# Patient Record
Sex: Female | Born: 1945
Health system: Southern US, Community
[De-identification: ages and names within clinical notes are randomized; demographics above are authoritative.]

## PROBLEM LIST (undated history)

## (undated) DIAGNOSIS — E039 Hypothyroidism, unspecified: Secondary | ICD-10-CM

## (undated) DIAGNOSIS — M47816 Spondylosis without myelopathy or radiculopathy, lumbar region: Secondary | ICD-10-CM

## (undated) DIAGNOSIS — F329 Major depressive disorder, single episode, unspecified: Secondary | ICD-10-CM

## (undated) DIAGNOSIS — F32A Depression, unspecified: Secondary | ICD-10-CM

## (undated) DIAGNOSIS — I10 Essential (primary) hypertension: Secondary | ICD-10-CM

## (undated) DIAGNOSIS — M1990 Unspecified osteoarthritis, unspecified site: Secondary | ICD-10-CM

## (undated) DIAGNOSIS — J45909 Unspecified asthma, uncomplicated: Secondary | ICD-10-CM

## (undated) DIAGNOSIS — I499 Cardiac arrhythmia, unspecified: Secondary | ICD-10-CM

## (undated) DIAGNOSIS — T7840XA Allergy, unspecified, initial encounter: Secondary | ICD-10-CM

## (undated) DIAGNOSIS — Z972 Presence of dental prosthetic device (complete) (partial): Secondary | ICD-10-CM

## (undated) DIAGNOSIS — M5431 Sciatica, right side: Secondary | ICD-10-CM

## (undated) DIAGNOSIS — G8929 Other chronic pain: Secondary | ICD-10-CM

## (undated) DIAGNOSIS — I4891 Unspecified atrial fibrillation: Secondary | ICD-10-CM

## (undated) DIAGNOSIS — M797 Fibromyalgia: Secondary | ICD-10-CM

## (undated) DIAGNOSIS — M545 Low back pain, unspecified: Secondary | ICD-10-CM

## (undated) DIAGNOSIS — R7303 Prediabetes: Secondary | ICD-10-CM

## (undated) DIAGNOSIS — D493 Neoplasm of unspecified behavior of breast: Secondary | ICD-10-CM

## (undated) HISTORY — DX: Major depressive disorder, single episode, unspecified: F32.9

## (undated) HISTORY — DX: Unspecified atrial fibrillation: I48.91

## (undated) HISTORY — DX: Allergy, unspecified, initial encounter: T78.40XA

## (undated) HISTORY — DX: Spondylosis without myelopathy or radiculopathy, lumbar region: M47.816

## (undated) HISTORY — DX: Depression, unspecified: F32.A

## (undated) HISTORY — PX: ABDOMINAL HYSTERECTOMY: SHX81

## (undated) HISTORY — DX: Prediabetes: R73.03

## (undated) HISTORY — DX: Neoplasm of unspecified behavior of breast: D49.3

## (undated) HISTORY — PX: BREAST SURGERY: SHX581

## (undated) HISTORY — DX: Unspecified asthma, uncomplicated: J45.909

## (undated) HISTORY — PX: ELECTROPHYSIOLOGIC STUDY: SHX172A

---

## 2004-06-13 ENCOUNTER — Other Ambulatory Visit: Payer: Self-pay

## 2004-06-13 ENCOUNTER — Inpatient Hospital Stay: Payer: Self-pay

## 2004-06-14 ENCOUNTER — Other Ambulatory Visit: Payer: Self-pay

## 2004-06-15 ENCOUNTER — Other Ambulatory Visit: Payer: Self-pay

## 2005-06-27 ENCOUNTER — Ambulatory Visit: Payer: Self-pay

## 2006-03-13 ENCOUNTER — Ambulatory Visit: Payer: Self-pay | Admitting: Pain Medicine

## 2006-03-20 ENCOUNTER — Ambulatory Visit: Payer: Self-pay | Admitting: Pain Medicine

## 2006-03-27 ENCOUNTER — Ambulatory Visit: Payer: Self-pay | Admitting: Pain Medicine

## 2006-05-15 ENCOUNTER — Emergency Department: Payer: Self-pay | Admitting: Emergency Medicine

## 2006-05-16 ENCOUNTER — Other Ambulatory Visit: Payer: Self-pay

## 2006-05-16 ENCOUNTER — Ambulatory Visit: Payer: Self-pay | Admitting: Emergency Medicine

## 2006-06-27 ENCOUNTER — Ambulatory Visit: Payer: Self-pay | Admitting: Family Medicine

## 2006-12-13 ENCOUNTER — Ambulatory Visit: Payer: Self-pay | Admitting: Pain Medicine

## 2006-12-19 ENCOUNTER — Ambulatory Visit: Payer: Self-pay | Admitting: Pain Medicine

## 2007-01-09 ENCOUNTER — Ambulatory Visit: Payer: Self-pay | Admitting: Physician Assistant

## 2007-01-23 ENCOUNTER — Ambulatory Visit: Payer: Self-pay | Admitting: Pain Medicine

## 2007-02-09 ENCOUNTER — Ambulatory Visit: Payer: Self-pay | Admitting: Physician Assistant

## 2007-02-23 ENCOUNTER — Ambulatory Visit: Payer: Self-pay | Admitting: Physician Assistant

## 2007-03-22 ENCOUNTER — Ambulatory Visit: Payer: Self-pay | Admitting: Physician Assistant

## 2007-03-24 ENCOUNTER — Emergency Department: Payer: Self-pay | Admitting: Emergency Medicine

## 2007-04-02 ENCOUNTER — Ambulatory Visit: Payer: Self-pay | Admitting: Specialist

## 2007-04-06 ENCOUNTER — Emergency Department: Payer: Self-pay | Admitting: Emergency Medicine

## 2007-04-18 ENCOUNTER — Ambulatory Visit: Payer: Self-pay | Admitting: Gastroenterology

## 2007-06-18 ENCOUNTER — Ambulatory Visit: Payer: Self-pay

## 2007-06-20 ENCOUNTER — Ambulatory Visit: Payer: Self-pay | Admitting: Physician Assistant

## 2007-07-19 ENCOUNTER — Ambulatory Visit: Payer: Self-pay | Admitting: Physician Assistant

## 2007-08-06 ENCOUNTER — Ambulatory Visit: Payer: Self-pay | Admitting: Family Medicine

## 2007-08-14 ENCOUNTER — Ambulatory Visit: Payer: Self-pay | Admitting: Physician Assistant

## 2007-08-20 ENCOUNTER — Ambulatory Visit: Payer: Self-pay | Admitting: Pain Medicine

## 2007-08-22 ENCOUNTER — Ambulatory Visit: Payer: Self-pay | Admitting: Family Medicine

## 2007-08-23 ENCOUNTER — Ambulatory Visit: Payer: Self-pay | Admitting: Family Medicine

## 2007-08-31 ENCOUNTER — Other Ambulatory Visit: Payer: Self-pay

## 2007-08-31 ENCOUNTER — Emergency Department: Payer: Self-pay | Admitting: Emergency Medicine

## 2007-09-13 ENCOUNTER — Ambulatory Visit: Payer: Self-pay | Admitting: Family Medicine

## 2007-09-17 ENCOUNTER — Ambulatory Visit: Payer: Self-pay | Admitting: Physician Assistant

## 2007-10-17 ENCOUNTER — Ambulatory Visit: Payer: Self-pay | Admitting: Physician Assistant

## 2007-10-18 ENCOUNTER — Ambulatory Visit: Payer: Self-pay | Admitting: Family Medicine

## 2007-11-13 ENCOUNTER — Ambulatory Visit: Payer: Self-pay | Admitting: Physician Assistant

## 2007-12-10 ENCOUNTER — Ambulatory Visit: Payer: Self-pay | Admitting: Specialist

## 2007-12-12 ENCOUNTER — Ambulatory Visit: Payer: Self-pay | Admitting: Physician Assistant

## 2007-12-21 ENCOUNTER — Ambulatory Visit: Payer: Self-pay | Admitting: Specialist

## 2008-01-17 ENCOUNTER — Ambulatory Visit: Payer: Self-pay | Admitting: Surgery

## 2008-01-21 ENCOUNTER — Ambulatory Visit: Payer: Self-pay | Admitting: Surgery

## 2008-02-20 ENCOUNTER — Ambulatory Visit: Payer: Self-pay | Admitting: Physician Assistant

## 2008-03-19 ENCOUNTER — Ambulatory Visit: Payer: Self-pay | Admitting: Pain Medicine

## 2008-04-05 ENCOUNTER — Ambulatory Visit: Payer: Self-pay | Admitting: Oncology

## 2008-04-11 ENCOUNTER — Emergency Department: Payer: Self-pay | Admitting: Emergency Medicine

## 2008-04-11 ENCOUNTER — Other Ambulatory Visit: Payer: Self-pay

## 2008-04-12 ENCOUNTER — Inpatient Hospital Stay: Payer: Self-pay | Admitting: Internal Medicine

## 2008-04-12 ENCOUNTER — Other Ambulatory Visit: Payer: Self-pay

## 2008-04-28 ENCOUNTER — Ambulatory Visit: Payer: Self-pay | Admitting: Maternal and Fetal Medicine

## 2008-05-06 ENCOUNTER — Ambulatory Visit: Payer: Self-pay | Admitting: Maternal and Fetal Medicine

## 2008-05-06 ENCOUNTER — Ambulatory Visit: Payer: Self-pay | Admitting: Oncology

## 2008-05-14 ENCOUNTER — Ambulatory Visit: Payer: Self-pay | Admitting: Specialist

## 2008-06-05 ENCOUNTER — Ambulatory Visit: Payer: Self-pay | Admitting: Oncology

## 2008-06-19 ENCOUNTER — Ambulatory Visit: Payer: Self-pay | Admitting: Oncology

## 2008-06-23 ENCOUNTER — Ambulatory Visit: Payer: Self-pay | Admitting: Physician Assistant

## 2008-07-06 ENCOUNTER — Ambulatory Visit: Payer: Self-pay | Admitting: Oncology

## 2008-08-05 ENCOUNTER — Ambulatory Visit: Payer: Self-pay | Admitting: Oncology

## 2008-09-24 ENCOUNTER — Ambulatory Visit: Payer: Self-pay | Admitting: Physician Assistant

## 2008-10-06 ENCOUNTER — Ambulatory Visit: Payer: Self-pay | Admitting: Specialist

## 2008-10-21 ENCOUNTER — Ambulatory Visit: Payer: Self-pay | Admitting: Specialist

## 2009-01-03 ENCOUNTER — Ambulatory Visit: Payer: Self-pay | Admitting: Oncology

## 2009-01-12 ENCOUNTER — Ambulatory Visit: Payer: Self-pay | Admitting: Oncology

## 2009-02-03 ENCOUNTER — Ambulatory Visit: Payer: Self-pay | Admitting: Oncology

## 2009-02-23 ENCOUNTER — Ambulatory Visit: Payer: Self-pay | Admitting: Family Medicine

## 2009-07-29 ENCOUNTER — Emergency Department: Payer: Self-pay | Admitting: Emergency Medicine

## 2009-12-04 ENCOUNTER — Ambulatory Visit: Payer: Self-pay | Admitting: Oncology

## 2009-12-08 ENCOUNTER — Ambulatory Visit: Payer: Self-pay | Admitting: Oncology

## 2010-01-03 ENCOUNTER — Ambulatory Visit: Payer: Self-pay | Admitting: Oncology

## 2010-01-15 ENCOUNTER — Emergency Department: Payer: Self-pay | Admitting: Internal Medicine

## 2010-01-15 ENCOUNTER — Emergency Department: Payer: Self-pay | Admitting: Emergency Medicine

## 2010-02-13 ENCOUNTER — Emergency Department: Payer: Self-pay | Admitting: Emergency Medicine

## 2010-02-24 ENCOUNTER — Observation Stay: Payer: Self-pay | Admitting: Internal Medicine

## 2010-06-23 ENCOUNTER — Ambulatory Visit: Payer: Self-pay | Admitting: Family Medicine

## 2011-02-17 ENCOUNTER — Ambulatory Visit: Payer: Self-pay | Admitting: Family Medicine

## 2012-02-02 ENCOUNTER — Ambulatory Visit: Payer: Self-pay | Admitting: Family Medicine

## 2012-06-03 ENCOUNTER — Observation Stay: Payer: Self-pay | Admitting: Internal Medicine

## 2012-06-03 LAB — CBC
MCH: 27.9 pg (ref 26.0–34.0)
MCHC: 32.8 g/dL (ref 32.0–36.0)
MCV: 85 fL (ref 80–100)
Platelet: 209 10*3/uL (ref 150–440)
RBC: 4.59 10*6/uL (ref 3.80–5.20)

## 2012-06-03 LAB — BASIC METABOLIC PANEL
Anion Gap: 9 (ref 7–16)
BUN: 13 mg/dL (ref 7–18)
Chloride: 109 mmol/L — ABNORMAL HIGH (ref 98–107)
EGFR (Non-African Amer.): >60
Glucose: 117 mg/dL — ABNORMAL HIGH (ref 65–99)
Osmolality: 288 (ref 275–301)
Potassium: 3.9 mmol/L (ref 3.5–5.1)
Sodium: 144 mmol/L (ref 136–145)

## 2013-05-16 ENCOUNTER — Ambulatory Visit: Payer: Self-pay | Admitting: Family Medicine

## 2013-05-30 ENCOUNTER — Ambulatory Visit: Payer: Self-pay | Admitting: Family Medicine

## 2013-07-03 ENCOUNTER — Ambulatory Visit: Payer: Self-pay | Admitting: Family Medicine

## 2013-07-05 ENCOUNTER — Ambulatory Visit: Payer: Self-pay | Admitting: Oncology

## 2013-08-16 ENCOUNTER — Ambulatory Visit: Payer: Self-pay | Admitting: Oncology

## 2013-08-16 LAB — CBC CANCER CENTER
Basophil #: 0.1 x10 3/mm (ref 0.0–0.1)
Eosinophil %: 1.8 %
HCT: 38.8 % (ref 35.0–47.0)
HGB: 12.3 g/dL (ref 12.0–16.0)
Lymphocyte #: 2.8 x10 3/mm (ref 1.0–3.6)
Lymphocyte %: 40.1 %
MCH: 26 pg (ref 26.0–34.0)
MCV: 82 fL (ref 80–100)
Monocyte %: 5.1 %
Neutrophil #: 3.6 x10 3/mm (ref 1.4–6.5)
Neutrophil %: 51.9 %

## 2013-08-16 LAB — IRON AND TIBC
Iron Bind.Cap.(Total): 389 ug/dL (ref 250–450)
Iron Saturation: 10 %
Unbound Iron-Bind.Cap.: 352 ug/dL

## 2013-08-16 LAB — LACTATE DEHYDROGENASE: LDH: 176 U/L (ref 81–246)

## 2013-08-16 LAB — FERRITIN: Ferritin (ARMC): 14 ng/mL (ref 8–388)

## 2013-09-05 ENCOUNTER — Ambulatory Visit: Payer: Self-pay | Admitting: Oncology

## 2013-11-28 ENCOUNTER — Ambulatory Visit: Payer: Self-pay | Admitting: Family Medicine

## 2013-12-08 ENCOUNTER — Emergency Department: Payer: Self-pay | Admitting: Emergency Medicine

## 2013-12-18 ENCOUNTER — Emergency Department: Payer: Self-pay | Admitting: Internal Medicine

## 2014-01-03 ENCOUNTER — Ambulatory Visit: Payer: Self-pay | Admitting: Family Medicine

## 2014-01-07 ENCOUNTER — Encounter: Payer: Self-pay | Admitting: Family Medicine

## 2014-12-04 ENCOUNTER — Ambulatory Visit: Admit: 2014-12-04 | Disposition: A | Discharge status: Home / Self Care | Payer: Self-pay | Admitting: Family Medicine

## 2014-12-23 NOTE — H&P (Signed)
PATIENT NAME:  Sonya Harper, Sonya Harper MR#:  [id number] DATE OF BIRTH:  Jul 20, 1946  DATE OF ADMISSION:  06/03/2012  ADDENDUM:   HOME MEDICATIONS:  1. Coreg CR 40 mg oral daily.  2. Synthroid 0.05 mg oral daily.  3. Zantac 75 mg oral b.i.d.    ____________________________ Albertine Patricia, MD dse:cbb D: 06/03/2012 04:57:50 ET T: 06/03/2012 11:15:45 ET JOB#: [id number]  cc: Albertine Patricia, MD, <Dictator> Ashok Norris, MD DAWOOD Graciela Husbands MD ELECTRONICALLY SIGNED 06/06/2012 1:46

## 2014-12-23 NOTE — Discharge Summary (Signed)
PATIENT NAME:  Sonya Harper, CONTI MR#:  [id number] DATE OF BIRTH:  1946/03/08  DATE OF ADMISSION:  06/03/2012 DATE OF DISCHARGE:  06/03/2012  PRIMARY CARE PHYSICIAN: Dr. Rutherford Nail   DISCHARGE DIAGNOSES:  1. Angioedema.  2. Hypertension.  3. Hypothyroidism.   CONDITION: Fair.   FOLLOWUP: Follow up with Dr. Rutherford Nail as needed.   DISCHARGE MEDICATIONS:  1. Prednisone 40 mg oral taper over five days.  2. Zantac 75 mg b.i.d. for four days.  3. Benadryl 25 mg t.i.d. for four days.  4. Coreg 6.25 mg oral 2 times a day.   ADMITTING HISTORY AND PHYSICAL: Please see detailed History and Physical dictated previously.   HOSPITAL COURSE:  69 year old African American female patient admitted to the Critical Care Unit secondary to swelling of her face and tongue, shortness of breath, and dysphagia. She was started on IV steroids, Zantac, and Benadryl. Improved well with symptoms improving significantly with no stridor, no dysphagia, and the swelling significantly reduced, vital signs in the normal range, and the patient was discharged home. At the time of discharge the patient's vital signs showed blood pressure 148/70, respirations 13, pulse 68, saturating 98% on room air.   DISCHARGE INSTRUCTIONS:  1. Follow up with Dr. Rutherford Nail in a week as needed.  2. Continue medications as prescribed.  3. The patient will be on cardiac diet with activity as tolerated.  4. The patient is to call her doctor or return to the Emergency Room if  she has any worsening symptoms.   TIME SPENT: Time spent today on this discharge dictation along with coordinating care and counseling of the patient was 40 minutes.   ____________________________ Leia Alf Lunden Stieber, MD srs:bjt D: 06/03/2012 09:46:42 ET T: 06/04/2012 09:57:58 ET JOB#: [id number]  cc: Alveta Heimlich R. Darvin Neighbours, MD, <Dictator> Ashok Norris, MD Alveta Heimlich Arlice Colt MD ELECTRONICALLY SIGNED 06/07/2012 11:19

## 2014-12-23 NOTE — H&P (Signed)
PATIENT NAME:  Sonya Harper, Sonya Harper MR#:  [id number] DATE OF BIRTH:  09-18-1945  DATE OF ADMISSION:  06/03/2012  REFERRING PHYSICIAN: Lurline Hare, MD   PRIMARY CARE PHYSICIAN: Ashok Norris, MD   CHIEF COMPLAINT: Shortness of breath, tongue and lip swelling.   HISTORY OF PRESENT ILLNESS: This is a 69 year old female with significant past medical history of recurrent angioedema in the past of reactive type with allergy to multiple medications and environmental allergies where she has been seen and followed by allergy physician in [place]. The patient was last admitted in June 2011 for similar presentation. The patient presents today with an episode of sudden shortness of breath, tongue and lip swelling that woke her up from sleep. In the ED the patient was given IV Zantac, IV Benadryl, IV Solu-Medrol, and EpiPen injection without much improvement of her symptoms so admission was requested by hospitalist for further monitoring of her airway. The patient reports shortness of breath and increased secretions. The patient denies any chest pain, abdominal pain, nausea, vomiting, diarrhea, constipation. The patient's family reports they used new kind of dust pesticide today where they were spraying it around the house and in the living room. The patient is unsure if she was exposed to it or not.   PAST MEDICAL HISTORY:  1. History of angioedema in the past.  2. Chronic fatigue syndrome.  3. Fibromyalgia.  4. History of adenopathy status post biopsy.  5. Hypertension.  6. Hypothyroidism.  7. History of colitis.   PAST SURGICAL HISTORY: Hysterectomy.   ALLERGIES: Lyrica, penicillin, sulfa, wheat, dust mites, mold.   HOME MEDICATIONS:  1. Coreg CR 40 mg oral daily.  2. Synthroid 0.05 mg oral daily.  3. Zantac 75 mg oral b.i.d.     FAMILY HISTORY: No history of heart disease, diabetes. No history of any acute allergies.   SOCIAL HISTORY: The patient lives at home with her family. No history of  smoking or alcohol intake. She is on disability.   REVIEW OF SYSTEMS: No fever. No complaints of weakness. Has complaint of some generalized fatigue. EYES: Denies blurry vision, double vision, or eye pain. ENT: No tinnitus, ear pain or epistaxis. RESPIRATORY: No cough. Had some dyspnea tonight and increased secretion, tongue and lip swelling. CARDIOVASCULAR: No chest pain or orthopnea. GI: No nausea, no vomiting, no diarrhea, no constipation. GU: No dysuria, no frequency, no hesitancy. ENDOCRINE: No polyuria, no dysuria, no nocturia. HEMATOLOGY: No anemia. No active bleeding. No bruising. INTEGUMENTARY: No acne or rash. MUSCULOSKELETAL: No arthritis, swelling, or gout. NEUROLOGIC: No numbness, weakness. No focal deficits. PSYCH: No history of depression. No alcohol or substance abuse.   PHYSICAL EXAMINATION:   VITAL SIGNS: Pulse 64, respiratory rate 20, blood pressure 126/72, saturating 98% on 1.5 liters nasal cannula.   GENERAL: Well nourished African American female who is comfortable in bed in no apparent distress still having some garbled speech.   HEENT: Head atraumatic, normocephalic. Pupils equal, reactive to light. Pink conjunctivae. Anicteric sclerae. Clear tympanic membranes. The patient had significant swelling of the tongue and the lip. No adenopathy.   NECK: Supple. No thyromegaly. No JVD. No swelling.   LUNGS: Clear to auscultation.   CHEST: Good air entry. No wheezing, rales, rhonchi. Upper airway have no stridor.   HEART: S1, S2 heard. No rubs, murmur, or gallops.   ABDOMEN: Obese, soft, nontender, nondistended. Bowel sounds present.   MUSCULOSKELETAL: Motor strength 5/5. No cyanosis. No clubbing. No joint swelling.   SKIN: No rash or erythema.  NEUROLOGIC: Cranial nerves grossly intact. No edema. Motor 5 out of 5.   PSYCHIATRIC: Appropriate affect. Awake, alert x3. Intact judgment and insight.   PERTINENT LABS: Glucose 117, BUN 13, creatinine 0.88, sodium 144,  potassium 3.9, chloride 109, CO2 26, white blood cells 10.6, hemoglobin 12.8, hematocrit 39.1, platelets 209.   ASSESSMENT AND PLAN:  1. Angioedema. This seems to be recurrent angioedema related to acute allergy. This appears to be allergy to new pesticide family started using at home. Will continue patient on steroids, Benadryl, and Zantac. The patient will be admitted to CCU for monitoring her airway. Will be continued on oxygen p.r.n. The patient will need EpiPen on discharge.  2. Hypertension. The patient will continue Coreg when she is able to take p.o. intake. The patient has been on Coreg for 17 years.  3. Hypothyroidism. Continue with Synthroid.  4. DVT prophylaxis. Sub-Q Lovenox.   TOTAL TIME SPENT ON ADMISSION AND PATIENT CARE: 55 minutes.   CODE STATUS: FULL CODE.   ____________________________ Albertine Patricia, MD dse:drc D: 06/03/2012 04:53:47 ET T: 06/03/2012 10:37:15 ET JOB#: [id number]  cc: Albertine Patricia, MD, <Dictator> Ashok Norris, MD Kelsee Preslar Graciela Husbands MD ELECTRONICALLY SIGNED 06/06/2012 1:47

## 2015-01-05 ENCOUNTER — Other Ambulatory Visit: Payer: Self-pay

## 2015-01-09 DIAGNOSIS — Z1239 Encounter for other screening for malignant neoplasm of breast: Secondary | ICD-10-CM | POA: Diagnosis not present

## 2015-01-09 DIAGNOSIS — N63 Unspecified lump in breast: Secondary | ICD-10-CM | POA: Diagnosis not present

## 2015-01-12 ENCOUNTER — Other Ambulatory Visit: Payer: Self-pay | Admitting: Family Medicine

## 2015-01-12 DIAGNOSIS — Z1239 Encounter for other screening for malignant neoplasm of breast: Secondary | ICD-10-CM

## 2015-01-12 DIAGNOSIS — N63 Unspecified lump in unspecified breast: Secondary | ICD-10-CM

## 2015-01-13 ENCOUNTER — Ambulatory Visit
Admission: RE | Admit: 2015-01-13 | Discharge: 2015-01-13 | Disposition: A | Discharge status: Home / Self Care | Payer: Medicare Other | Source: Ambulatory Visit | Attending: Family Medicine | Admitting: Family Medicine

## 2015-01-13 ENCOUNTER — Ambulatory Visit: Payer: Self-pay

## 2015-01-13 DIAGNOSIS — N63 Unspecified lump in unspecified breast: Secondary | ICD-10-CM

## 2015-01-13 DIAGNOSIS — N6489 Other specified disorders of breast: Secondary | ICD-10-CM | POA: Diagnosis not present

## 2015-01-13 DIAGNOSIS — R928 Other abnormal and inconclusive findings on diagnostic imaging of breast: Secondary | ICD-10-CM | POA: Insufficient documentation

## 2015-01-13 DIAGNOSIS — Z1239 Encounter for other screening for malignant neoplasm of breast: Secondary | ICD-10-CM

## 2015-02-06 DIAGNOSIS — E559 Vitamin D deficiency, unspecified: Secondary | ICD-10-CM | POA: Diagnosis not present

## 2015-02-06 DIAGNOSIS — H0520 Unspecified exophthalmos: Secondary | ICD-10-CM | POA: Diagnosis not present

## 2015-02-06 DIAGNOSIS — K229 Disease of esophagus, unspecified: Secondary | ICD-10-CM | POA: Diagnosis not present

## 2015-02-06 DIAGNOSIS — E039 Hypothyroidism, unspecified: Secondary | ICD-10-CM | POA: Diagnosis not present

## 2015-02-06 DIAGNOSIS — R5383 Other fatigue: Secondary | ICD-10-CM | POA: Diagnosis not present

## 2015-02-06 DIAGNOSIS — E042 Nontoxic multinodular goiter: Secondary | ICD-10-CM | POA: Diagnosis not present

## 2015-02-06 DIAGNOSIS — I10 Essential (primary) hypertension: Secondary | ICD-10-CM | POA: Diagnosis not present

## 2015-02-06 DIAGNOSIS — E063 Autoimmune thyroiditis: Secondary | ICD-10-CM | POA: Diagnosis not present

## 2015-03-03 ENCOUNTER — Encounter (INDEPENDENT_AMBULATORY_CARE_PROVIDER_SITE_OTHER): Payer: Self-pay

## 2015-03-03 ENCOUNTER — Ambulatory Visit (INDEPENDENT_AMBULATORY_CARE_PROVIDER_SITE_OTHER): Payer: Medicare Other | Admitting: Family Medicine

## 2015-03-03 ENCOUNTER — Encounter: Payer: Self-pay | Admitting: Family Medicine

## 2015-03-03 VITALS — BP 138/88 | HR 79 | Temp 98.0°F | Resp 16 | Ht 66.0 in | Wt 197.1 lb

## 2015-03-03 DIAGNOSIS — W57XXXA Bitten or stung by nonvenomous insect and other nonvenomous arthropods, initial encounter: Secondary | ICD-10-CM

## 2015-03-03 DIAGNOSIS — E049 Nontoxic goiter, unspecified: Secondary | ICD-10-CM | POA: Diagnosis not present

## 2015-03-03 DIAGNOSIS — J4531 Mild persistent asthma with (acute) exacerbation: Secondary | ICD-10-CM

## 2015-03-03 DIAGNOSIS — K219 Gastro-esophageal reflux disease without esophagitis: Secondary | ICD-10-CM | POA: Diagnosis not present

## 2015-03-03 DIAGNOSIS — T148 Other injury of unspecified body region: Secondary | ICD-10-CM

## 2015-03-03 DIAGNOSIS — J4530 Mild persistent asthma, uncomplicated: Secondary | ICD-10-CM | POA: Insufficient documentation

## 2015-03-03 MED ORDER — OMEPRAZOLE 40 MG PO CPDR: 40.0000 mg | DELAYED_RELEASE_CAPSULE | Freq: Every day | ORAL | Status: DC

## 2015-03-03 NOTE — Progress Notes (Signed)
Name: Sonya Harper   MRN: [id number]    DOB: [birth year]   Date:03/03/2015       Progress Note  Subjective  Chief Complaint  Chief Complaint  Patient presents with  . Depression  . Asthma  . Hypothyroidism    Asthma She complains of cough, difficulty breathing, shortness of breath and wheezing. There is no hemoptysis. This is a chronic problem. The current episode started more than 1 year ago. The problem occurs intermittently. Associated symptoms include heartburn, malaise/fatigue and myalgias. Pertinent negatives include no chest pain, fever, headaches, sore throat or weight loss. Her symptoms are aggravated by pollen, exposure to smoke and exposure to fumes. Her symptoms are alleviated by beta-agonist and steroid inhaler. She reports moderate improvement on treatment. Her past medical history is significant for asthma.  Thyroid Problem Symptoms include anxiety (has a goiter for which she is seeingDr. Francoise Schaumann), depressed mood, fatigue, leg swelling and palpitations. Patient reports no constipation, diarrhea, tremors or weight loss. The symptoms have been worsening. Past treatments include levothyroxine. The treatment provided moderate relief. There is no history of atrial fibrillation.  Hypertension This is a chronic problem. The current episode started more than 1 year ago. The problem is unchanged. The problem is controlled. Associated symptoms include anxiety, malaise/fatigue, palpitations and shortness of breath. Pertinent negatives include no blurred vision, chest pain, headaches, neck pain or orthopnea. Agents associated with hypertension include thyroid hormones. Risk factors for coronary artery disease include dyslipidemia, sedentary lifestyle, stress and post-menopausal state. Past treatments include beta blockers. The current treatment provides moderate improvement. There are no compliance problems.  Hypertensive end-organ damage includes a thyroid problem.   Fatigue  Patient  complains of recurrent fatigue. This is particularly been a problem since she's had serum adjustments and questions about her thyroid medication she is currently thinking of switching to a different endocrinologist.   Past Medical History  Diagnosis Date  . Allergy   . Breast neoplasm   . Depression   . Asthma     History  Substance Use Topics  . Smoking status: Never Smoker   . Smokeless tobacco: Not on file  . Alcohol Use: No     Current outpatient prescriptions:  .  carvedilol (COREG CR) 80 MG 24 hr capsule, Take 80 mg by mouth daily., Disp: , Rfl:  .  omeprazole (PRILOSEC) 40 MG capsule, Take 40 mg by mouth daily., Disp: , Rfl:  .  SYNTHROID 50 MCG tablet, TAKE 1 BY MOUTH DAILY. DO NOT SUBSTITUTE, Disp: , Rfl: 0  Allergies  Allergen Reactions  . Penicillins Other (See Comments)    Other Reaction: Not Assessed    Review of Systems  Constitutional: Positive for malaise/fatigue and fatigue. Negative for fever, chills and weight loss.  HENT: Negative for congestion, hearing loss, sore throat and tinnitus.   Eyes: Negative for blurred vision, double vision and redness.  Respiratory: Positive for cough, shortness of breath and wheezing. Negative for hemoptysis.   Cardiovascular: Positive for palpitations. Negative for chest pain, orthopnea, claudication and leg swelling.  Gastrointestinal: Positive for heartburn. Negative for nausea, vomiting, diarrhea, constipation and blood in stool.  Genitourinary: Negative for dysuria, urgency, frequency and hematuria.  Musculoskeletal: Positive for myalgias and joint pain. Negative for back pain, falls and neck pain.  Skin: Positive for rash. Negative for itching.  Neurological: Positive for weakness. Negative for dizziness, tingling, tremors, focal weakness, seizures, loss of consciousness and headaches.  Endo/Heme/Allergies: Does not bruise/bleed easily.  Psychiatric/Behavioral: Positive for depression. Negative  for substance abuse. The  patient is nervous/anxious (has a goiter for which she is seeingDr. Francoise Schaumann). The patient does not have insomnia.      Objective  Filed Vitals:   03/03/15 1008  BP: 138/88  Pulse: 79  Temp: 98 F (36.7 C)  Resp: 16  Height: 5\' 6"  (1.676 m)  Weight: 197 lb 2 oz (89.415 kg)  SpO2: 95%     Physical Exam  Constitutional: She is oriented to person, place, and time and well-developed, well-nourished, and in no distress.  HENT:  Head: Normocephalic.  Eyes: EOM are normal. Pupils are equal, round, and reactive to light.  Neck: Normal range of motion. No thyromegaly present.  Cardiovascular: Normal rate, regular rhythm and normal heart sounds.   No murmur heard. Pulmonary/Chest: Effort normal and breath sounds normal.  Abdominal: Soft. Bowel sounds are normal.  Musculoskeletal: Normal range of motion. She exhibits no edema.  Neurological: She is alert and oriented to person, place, and time. No cranial nerve deficit. Gait normal.  Skin: Skin is warm and dry. Rash (multiple pigmented palpable papules which are erythematous on the lower extremities consistent with insect bites) noted.  Psychiatric: Memory and affect normal.      Assessment & Plan  1. Goiter Per endocrinologist  2. Gastroesophageal reflux disease without esophagitis Stable  3. Asthma, mild persistent, with acute exacerbation Recurrent cough or exacerbations - Ambulatory referral to Pulmonology  4. Insect bite OTC hydrocortisone cream. If this is not effective did prescription for Aristocort cream

## 2015-03-03 NOTE — Patient Instructions (Signed)
F/U 4 MO 

## 2015-03-19 ENCOUNTER — Institutional Professional Consult (permissible substitution): Payer: Medicare Other | Admitting: Internal Medicine

## 2015-03-25 ENCOUNTER — Telehealth: Payer: Self-pay | Admitting: Family Medicine

## 2015-03-25 DIAGNOSIS — E079 Disorder of thyroid, unspecified: Secondary | ICD-10-CM

## 2015-03-25 NOTE — Telephone Encounter (Signed)
Pt states that she would no longer like to see dr Celine Ahr therefore she is requesting a different thyroid dr at [facility] clinic.  Patient is also requesting a return call to discuss Synthyroid.

## 2015-03-27 NOTE — Telephone Encounter (Signed)
Pt would like a referral to go to another thyroid specialist due to her current doctor's office getting the dosage wrong on her medication, her not being able to speak directly to her doctor with her concerns and no one caught the error for a year according to pt. She would also like to know if taking the wrong dosage would affect her ability to stand for long periods of time and cause her to feel more fatigued.

## 2015-03-27 NOTE — Telephone Encounter (Signed)
Referral ok to [facility] endo If she wishes me to address med issues before that, needs to rto

## 2015-04-24 ENCOUNTER — Institutional Professional Consult (permissible substitution): Payer: Medicare Other | Admitting: Pulmonary Disease

## 2015-04-28 ENCOUNTER — Institutional Professional Consult (permissible substitution): Payer: Medicare Other | Admitting: Internal Medicine

## 2015-05-27 DIAGNOSIS — E039 Hypothyroidism, unspecified: Secondary | ICD-10-CM | POA: Diagnosis not present

## 2015-06-24 ENCOUNTER — Telehealth: Payer: Self-pay | Admitting: Family Medicine

## 2015-06-24 NOTE — Telephone Encounter (Signed)
Patient must be seen for PFT and then will order inhaler if needed

## 2015-06-24 NOTE — Telephone Encounter (Signed)
Patient is requesting a return call, she is needing a prescription for a inhaler and she cannot remember the name of it. States that she spoke with you the other day and gave you the name and said you where going to call her back

## 2015-06-25 NOTE — Telephone Encounter (Signed)
Have tried calling patient several times, but no response. Last seen in June 2016

## 2015-06-29 ENCOUNTER — Telehealth: Payer: Self-pay | Admitting: Family Medicine

## 2015-06-29 ENCOUNTER — Other Ambulatory Visit: Payer: Self-pay | Admitting: Family Medicine

## 2015-06-29 NOTE — Telephone Encounter (Signed)
Pt would like to know if you can change her inhaler to [facility] also she needs a refill on Ventolin inhaler to be sent to [address].

## 2015-06-30 NOTE — Telephone Encounter (Signed)
Per previous message. Dr. Rutherford Nail stated she must be seen for PFT to be prescribed inhalers

## 2015-07-02 ENCOUNTER — Ambulatory Visit: Payer: Medicare Other | Admitting: Family Medicine

## 2015-07-09 ENCOUNTER — Ambulatory Visit (INDEPENDENT_AMBULATORY_CARE_PROVIDER_SITE_OTHER): Payer: Medicare Other | Admitting: Pulmonary Disease

## 2015-07-09 ENCOUNTER — Encounter: Payer: Self-pay | Admitting: Pulmonary Disease

## 2015-07-09 VITALS — BP 122/74 | HR 67 | Ht 66.0 in | Wt 202.4 lb

## 2015-07-09 DIAGNOSIS — J387 Other diseases of larynx: Secondary | ICD-10-CM | POA: Diagnosis not present

## 2015-07-09 DIAGNOSIS — R05 Cough: Secondary | ICD-10-CM | POA: Diagnosis not present

## 2015-07-09 DIAGNOSIS — R059 Cough, unspecified: Secondary | ICD-10-CM

## 2015-07-09 DIAGNOSIS — K219 Gastro-esophageal reflux disease without esophagitis: Secondary | ICD-10-CM

## 2015-07-09 DIAGNOSIS — R0600 Dyspnea, unspecified: Secondary | ICD-10-CM

## 2015-07-09 DIAGNOSIS — J4530 Mild persistent asthma, uncomplicated: Secondary | ICD-10-CM | POA: Diagnosis not present

## 2015-07-09 MED ORDER — ALBUTEROL SULFATE HFA 108 (90 BASE) MCG/ACT IN AERS: 2.0000 | INHALATION_SPRAY | Freq: Four times a day (QID) | RESPIRATORY_TRACT | Status: DC | PRN

## 2015-07-09 MED ORDER — FLUTICASONE FUROATE-VILANTEROL 100-25 MCG/INH IN AEPB: 1.0000 | INHALATION_SPRAY | Freq: Every day | RESPIRATORY_TRACT | Status: DC

## 2015-07-09 MED ORDER — ESOMEPRAZOLE MAGNESIUM 40 MG PO PACK: 40.0000 mg | PACK | Freq: Every day | ORAL | Status: DC

## 2015-07-09 NOTE — Progress Notes (Addendum)
PULMONARY CONSULT NOTE  Requesting MD/Service: Rutherford Nail Date of consult: 07/09/15 Reason for consultation: cough, dyspnea, h/o asthma  Chief complaint: cough  HPI:  60 F with hx of possible asthma first diagnosed 3-4 yrs ago presents with CC of NP cough X 3 yrs. Occasionally severe enough to cause post tussive emesis. Tends to be worse in evenings. Occasionally awakens her from sleep. Previously on ACEI which was stopped without improvement.  Also reports+ dyspnea - one flight, 1/4 mile. Occasional pedal edema. Occ palpitations, Previously on ACEI. "Allergic to mold".  Trial of albuterol MDI - perhaps some improvement in dyspnea, not cough.   PFTs 07/30/13 @ [facility]: FVC:  2.48 L (107%)             FEV1:  1.81 L (97%)             FEV1%: 73 %             TLC: 4.54 L (96%)             DLCO:  70%             DCO/VA: 105%        Past Medical History  Diagnosis Date  . Allergy   . Breast neoplasm   . Depression   . Asthma    Past Surgical History  Procedure Laterality Date  . Breast surgery    . Abdominal hysterectomy       Current outpatient prescriptions:  .  albuterol (PROVENTIL HFA;VENTOLIN HFA) 108 (90 BASE) MCG/ACT inhaler, Inhale 2 puffs into the lungs every 6 (six) hours as needed for wheezing or shortness of breath (or cough)., Disp: 1 Inhaler, Rfl: 6 .  COREG CR 80 MG 24 hr capsule, TAKE ONE CAPSULE BY MOUTH EVERY DAY, Disp: 90 capsule, Rfl: 1 .  esomeprazole (NEXIUM) 40 MG packet, Take 40 mg by mouth daily before breakfast., Disp: 30 each, Rfl: 12 .  Fluticasone Furoate-Vilanterol (BREO ELLIPTA) 100-25 MCG/INH AEPB, Inhale 1 puff into the lungs daily., Disp: 1 each, Rfl: 12 .  SYNTHROID 50 MCG tablet, TAKE 1 BY MOUTH DAILY. DO NOT SUBSTITUTE, Disp: , Rfl: 0  Not taking PPI regularly - "only when I fell that I need it"   Social History   Social History  . Marital Status: Married    Spouse Name: N/A  . Number of Children: N/A  . Years of Education: N/A    Occupational History  . Not on file.   Social History Main Topics  . Smoking status: Never Smoker   . Smokeless tobacco: Not on file  . Alcohol Use: No  . Drug Use: No  . Sexual Activity: Not on file   Other Topics Concern  . Not on file   Social History Narrative    Family History  Problem Relation Age of Onset  . Breast cancer Sister 76  . Colon cancer Paternal Aunt   . Stroke Mother     Review of Systems  Constitutional: Negative.   HENT: Positive for congestion. Negative for ear discharge, ear pain, sore throat and tinnitus.   Eyes: Negative.   Respiratory: Positive for cough and shortness of breath. Negative for stridor.   Cardiovascular: Negative.   Gastrointestinal: Negative.   Musculoskeletal: Negative.   Skin: Negative.   Neurological: Negative.      Filed Vitals:   07/09/15 0849  BP: 122/74  Pulse: 67  Height: 5\' 6"  (1.676 m)  Weight: 202 lb 6.4 oz (91.808 kg)  SpO2: 97%    EXAM:  Gen: WDWN, NAD HEENT: TMs clear, oropharynx normal, nasal mucosa pink, turbinates slightly enlarged Neck: NO LAN, no JVD Lungs: full BS, no wheezes Cardiovascular: Reg, no M Abdomen: soft, NT, +BS Ext: no C/C/E Neuro: no focal deficits  DATA:  BMP Latest Ref Rng 06/03/2012  Glucose 65-99 mg/dL 117(H)  BUN 7-18 mg/dL 13  Creatinine 0.60-1.30 mg/dL 0.88  Sodium 136-145 mmol/L 144  Potassium 3.5-5.1 mmol/L 3.9  Chloride 98-107 mmol/L 109(H)  CO2 21-32 mmol/L 26  Calcium 8.5-10.1 mg/dL 8.3(L)    CBC Latest Ref Rng 08/16/2013 06/03/2012  WBC 3.6-11.0 x10 3/mm  7.0 10.6  Hemoglobin 12.0-16.0 g/dL 12.3 12.8  Hematocrit 35.0-47.0 % 38.8 39.1  Platelets 150-440 x10 3/mm  219 209    CXR (02/03/14): [facility]     IMPRESSION:   Cough - unclear eiology. Possible asthma but no other findings to support this diagnosis. Suspect GERD/LPR H/O asthma, mild persistent, uncomplicated. Appears to be controlled Dyspnea - mild. Suspect more deconditioning than  asthma   PLAN:  Trial Breo - sample and Rx given Trial PPI - Nexium @ HS PRN albuterol MDI ROV in 4-6 weeks with CXR in interim  Merton Border, MD PCCM service Mobile [phone] Pager [phone]

## 2015-07-09 NOTE — Addendum Note (Signed)
Addended by: Renelda Mom on: 07/09/2015 09:33 AM   Modules accepted: Orders

## 2015-07-09 NOTE — Patient Instructions (Signed)
Take Nexium EVERY DAY 1 hour or so before bedtime Breo inhaler - one inhalation every day in the morning Ventolin inhaler up to 4 times a day as needed for shortness of breath or for cough Follow up in 4-6 weeks

## 2015-07-16 DIAGNOSIS — H04221 Epiphora due to insufficient drainage, right lacrimal gland: Secondary | ICD-10-CM | POA: Diagnosis not present

## 2015-07-16 DIAGNOSIS — H02843 Edema of right eye, unspecified eyelid: Secondary | ICD-10-CM | POA: Diagnosis not present

## 2015-08-12 ENCOUNTER — Ambulatory Visit: Payer: Medicare Other | Admitting: Family Medicine

## 2015-08-20 ENCOUNTER — Other Ambulatory Visit: Payer: Self-pay

## 2015-08-20 DIAGNOSIS — R928 Other abnormal and inconclusive findings on diagnostic imaging of breast: Secondary | ICD-10-CM

## 2015-08-21 ENCOUNTER — Ambulatory Visit: Payer: Medicare Other | Admitting: Pulmonary Disease

## 2015-10-02 ENCOUNTER — Other Ambulatory Visit: Payer: Medicare Other

## 2015-10-02 ENCOUNTER — Ambulatory Visit: Payer: Medicare Other

## 2015-10-05 ENCOUNTER — Ambulatory Visit: Payer: Medicare Other | Admitting: Family Medicine

## 2015-10-26 ENCOUNTER — Ambulatory Visit: Payer: Medicare Other | Attending: Family Medicine

## 2015-10-26 ENCOUNTER — Other Ambulatory Visit: Payer: Medicare Other

## 2015-11-23 ENCOUNTER — Other Ambulatory Visit: Payer: Self-pay | Admitting: Family Medicine

## 2015-11-23 ENCOUNTER — Ambulatory Visit
Admission: RE | Admit: 2015-11-23 | Discharge: 2015-11-23 | Disposition: A | Discharge status: Home / Self Care | Payer: Medicare Other | Source: Ambulatory Visit | Attending: Family Medicine | Admitting: Family Medicine

## 2015-11-23 DIAGNOSIS — N63 Unspecified lump in unspecified breast: Secondary | ICD-10-CM

## 2015-11-23 DIAGNOSIS — R928 Other abnormal and inconclusive findings on diagnostic imaging of breast: Secondary | ICD-10-CM | POA: Diagnosis not present

## 2015-11-23 DIAGNOSIS — N6489 Other specified disorders of breast: Secondary | ICD-10-CM | POA: Diagnosis not present

## 2015-11-26 ENCOUNTER — Ambulatory Visit
Admission: RE | Admit: 2015-11-26 | Discharge: 2015-11-26 | Disposition: A | Discharge status: Home / Self Care | Payer: Medicare Other | Source: Ambulatory Visit | Attending: Family Medicine | Admitting: Family Medicine

## 2015-11-26 DIAGNOSIS — R928 Other abnormal and inconclusive findings on diagnostic imaging of breast: Secondary | ICD-10-CM | POA: Diagnosis not present

## 2015-11-26 DIAGNOSIS — N63 Unspecified lump in unspecified breast: Secondary | ICD-10-CM

## 2015-11-26 DIAGNOSIS — N6489 Other specified disorders of breast: Secondary | ICD-10-CM | POA: Diagnosis not present

## 2015-12-14 DIAGNOSIS — E039 Hypothyroidism, unspecified: Secondary | ICD-10-CM | POA: Diagnosis not present

## 2015-12-21 DIAGNOSIS — E039 Hypothyroidism, unspecified: Secondary | ICD-10-CM | POA: Diagnosis not present

## 2015-12-21 DIAGNOSIS — E042 Nontoxic multinodular goiter: Secondary | ICD-10-CM | POA: Diagnosis not present

## 2015-12-22 ENCOUNTER — Other Ambulatory Visit: Payer: Self-pay | Admitting: Emergency Medicine

## 2015-12-22 MED ORDER — CARVEDILOL PHOSPHATE ER 80 MG PO CP24: 80.0000 mg | ORAL_CAPSULE | Freq: Every day | ORAL | Status: DC

## 2016-01-09 ENCOUNTER — Other Ambulatory Visit: Payer: Self-pay | Admitting: Family Medicine

## 2016-02-25 ENCOUNTER — Telehealth: Payer: Self-pay | Admitting: Family Medicine

## 2016-02-25 NOTE — Telephone Encounter (Signed)
PT FOLLOWING UP ON A REFERRAL ABOUT 3 WKS AGO THAT YOU WERE TO DO TO [facility] FOR HER TO HAVE A TEST. SAID THAT YOU WOULD REMEMBER WHAT IT IS FOR BUT IT JUST HAS NOT BEEN DONE. PLEASE CALL THEM AND GET THIS DONE AND THEN CALL THE PATIENT SHE SAID TO LET HER KNOW IT HAS BEEN DONE. SAID THAT IF NO ANSWER YOU CAN LEAVE HER A MESSAGE

## 2016-03-17 NOTE — Telephone Encounter (Signed)
Patient is checking status on referral to [facility] breast center. She has had a regular mammogram and a diagnositc. She tried making appointment herself but was told that it had to be done by our office. It is okay to leave voice message. Stated that she was suppose to have had this appointment a few months ago.

## 2016-03-29 ENCOUNTER — Telehealth: Payer: Self-pay | Admitting: Family Medicine

## 2016-03-29 DIAGNOSIS — R928 Other abnormal and inconclusive findings on diagnostic imaging of breast: Secondary | ICD-10-CM

## 2016-04-01 NOTE — Telephone Encounter (Signed)
Mammogram ordered. Patient can call and make appointment on Monday.

## 2016-04-04 NOTE — Telephone Encounter (Signed)
[place]. Mammogram and [place] scheduled for August 17,2017 at 11:00am. Patient notified

## 2016-04-07 ENCOUNTER — Telehealth: Payer: Self-pay

## 2016-04-07 ENCOUNTER — Other Ambulatory Visit: Payer: Self-pay

## 2016-04-07 NOTE — Telephone Encounter (Signed)
Gastroenterology Pre-Procedure Review  Request Date: 05/13/2016 Requesting Physician:   PATIENT REVIEW QUESTIONS: The patient responded to the following health history questions as indicated:    1. Are you having any GI issues? yes (stomach pain ) 2. Do you have a personal history of Polyps? yes (pre cancerous ) 3. Do you have a family history of Colon Cancer or Polyps? yes (Paternal aunt x2 colon cancer ) 4. Diabetes Mellitus? no 5. Joint replacements in the past 12 months?no 6. Major health problems in the past 3 months?no 7. Any artificial heart valves, MVP, or defibrillator?no    MEDICATIONS & ALLERGIES:    Patient reports the following regarding taking any anticoagulation/antiplatelet therapy:   Plavix, Coumadin, Eliquis, Xarelto, Lovenox, Pradaxa, Brilinta, or Effient? no Aspirin? no  Patient confirms/reports the following medications:  Current Outpatient Prescriptions  Medication Sig Dispense Refill  . albuterol (PROVENTIL HFA;VENTOLIN HFA) 108 (90 BASE) MCG/ACT inhaler Inhale 2 puffs into the lungs every 6 (six) hours as needed for wheezing or shortness of breath (or cough). 1 Inhaler 6  . carvedilol (COREG CR) 80 MG 24 hr capsule Take 1 capsule (80 mg total) by mouth daily. 90 capsule 0  . esomeprazole (NEXIUM) 40 MG packet Take 40 mg by mouth daily before breakfast. 30 each 12  . Fluticasone Furoate-Vilanterol (BREO ELLIPTA) 100-25 MCG/INH AEPB Inhale 1 puff into the lungs daily. 1 each 12  . SYNTHROID 50 MCG tablet TAKE 1 BY MOUTH DAILY. DO NOT SUBSTITUTE  0   No current facility-administered medications for this visit.     Patient confirms/reports the following allergies:  Allergies  Allergen Reactions  . Molds & Smuts Anaphylaxis  . Penicillins Other (See Comments)    Other Reaction: Not Assessed    No orders of the defined types were placed in this encounter.   AUTHORIZATION INFORMATION Primary Insurance: 1D#: Group #:  Secondary Insurance: 1D#: Group  #:  SCHEDULE INFORMATION: Date: 05/13/2016 Time: Location: [facility]

## 2016-04-07 NOTE — Telephone Encounter (Signed)
Screening Colonoscopy Z12.11 [facility] 05/13/2016 Please pre cert  

## 2016-04-21 ENCOUNTER — Ambulatory Visit: Payer: Medicare Other

## 2016-04-21 ENCOUNTER — Other Ambulatory Visit: Payer: Medicare Other

## 2016-05-04 ENCOUNTER — Encounter: Payer: Self-pay | Admitting: *Deleted

## 2016-05-05 ENCOUNTER — Ambulatory Visit
Admission: RE | Admit: 2016-05-05 | Discharge: 2016-05-05 | Disposition: A | Discharge status: Home / Self Care | Payer: Medicare Other | Source: Ambulatory Visit | Attending: Family Medicine | Admitting: Family Medicine

## 2016-05-05 ENCOUNTER — Other Ambulatory Visit: Payer: Self-pay | Admitting: Family Medicine

## 2016-05-05 DIAGNOSIS — R928 Other abnormal and inconclusive findings on diagnostic imaging of breast: Secondary | ICD-10-CM

## 2016-05-05 DIAGNOSIS — R590 Localized enlarged lymph nodes: Secondary | ICD-10-CM | POA: Diagnosis not present

## 2016-05-05 DIAGNOSIS — N63 Unspecified lump in breast: Secondary | ICD-10-CM | POA: Insufficient documentation

## 2016-05-12 ENCOUNTER — Encounter: Payer: Self-pay | Admitting: Gastroenterology

## 2016-05-12 NOTE — Telephone Encounter (Signed)
error 

## 2016-05-12 NOTE — Discharge Instructions (Signed)

## 2016-05-13 ENCOUNTER — Ambulatory Visit: Payer: Medicare Other | Admitting: Anesthesiology

## 2016-05-13 ENCOUNTER — Other Ambulatory Visit: Payer: Self-pay | Admitting: Family Medicine

## 2016-05-13 ENCOUNTER — Encounter
Admission: RE | Disposition: A | Discharge status: Home / Self Care | Payer: Self-pay | Source: Ambulatory Visit | Attending: Gastroenterology

## 2016-05-13 ENCOUNTER — Ambulatory Visit
Admission: RE | Admit: 2016-05-13 | Discharge: 2016-05-13 | Disposition: A | Discharge status: Home / Self Care | Payer: Medicare Other | Source: Ambulatory Visit | Attending: Gastroenterology | Admitting: Gastroenterology

## 2016-05-13 DIAGNOSIS — Z91048 Other nonmedicinal substance allergy status: Secondary | ICD-10-CM | POA: Insufficient documentation

## 2016-05-13 DIAGNOSIS — Z803 Family history of malignant neoplasm of breast: Secondary | ICD-10-CM | POA: Insufficient documentation

## 2016-05-13 DIAGNOSIS — I10 Essential (primary) hypertension: Secondary | ICD-10-CM | POA: Diagnosis not present

## 2016-05-13 DIAGNOSIS — Z79899 Other long term (current) drug therapy: Secondary | ICD-10-CM | POA: Diagnosis not present

## 2016-05-13 DIAGNOSIS — K5730 Diverticulosis of large intestine without perforation or abscess without bleeding: Secondary | ICD-10-CM | POA: Insufficient documentation

## 2016-05-13 DIAGNOSIS — K640 First degree hemorrhoids: Secondary | ICD-10-CM | POA: Insufficient documentation

## 2016-05-13 DIAGNOSIS — E039 Hypothyroidism, unspecified: Secondary | ICD-10-CM | POA: Insufficient documentation

## 2016-05-13 DIAGNOSIS — J45909 Unspecified asthma, uncomplicated: Secondary | ICD-10-CM | POA: Insufficient documentation

## 2016-05-13 DIAGNOSIS — M19071 Primary osteoarthritis, right ankle and foot: Secondary | ICD-10-CM | POA: Diagnosis not present

## 2016-05-13 DIAGNOSIS — Z880 Allergy status to penicillin: Secondary | ICD-10-CM | POA: Diagnosis not present

## 2016-05-13 DIAGNOSIS — F329 Major depressive disorder, single episode, unspecified: Secondary | ICD-10-CM | POA: Insufficient documentation

## 2016-05-13 DIAGNOSIS — D123 Benign neoplasm of transverse colon: Secondary | ICD-10-CM

## 2016-05-13 DIAGNOSIS — Z91018 Allergy to other foods: Secondary | ICD-10-CM | POA: Diagnosis not present

## 2016-05-13 DIAGNOSIS — Z823 Family history of stroke: Secondary | ICD-10-CM | POA: Insufficient documentation

## 2016-05-13 DIAGNOSIS — D122 Benign neoplasm of ascending colon: Secondary | ICD-10-CM | POA: Diagnosis not present

## 2016-05-13 DIAGNOSIS — Z7951 Long term (current) use of inhaled steroids: Secondary | ICD-10-CM | POA: Insufficient documentation

## 2016-05-13 DIAGNOSIS — M797 Fibromyalgia: Secondary | ICD-10-CM | POA: Insufficient documentation

## 2016-05-13 DIAGNOSIS — Z1211 Encounter for screening for malignant neoplasm of colon: Secondary | ICD-10-CM | POA: Diagnosis not present

## 2016-05-13 DIAGNOSIS — D124 Benign neoplasm of descending colon: Secondary | ICD-10-CM | POA: Diagnosis not present

## 2016-05-13 DIAGNOSIS — Z87892 Personal history of anaphylaxis: Secondary | ICD-10-CM | POA: Diagnosis not present

## 2016-05-13 DIAGNOSIS — M19072 Primary osteoarthritis, left ankle and foot: Secondary | ICD-10-CM | POA: Insufficient documentation

## 2016-05-13 DIAGNOSIS — Z809 Family history of malignant neoplasm, unspecified: Secondary | ICD-10-CM

## 2016-05-13 DIAGNOSIS — Z800 Family history of malignant neoplasm of digestive organs: Secondary | ICD-10-CM | POA: Diagnosis not present

## 2016-05-13 DIAGNOSIS — N6310 Unspecified lump in the right breast, unspecified quadrant: Secondary | ICD-10-CM

## 2016-05-13 HISTORY — DX: Unspecified osteoarthritis, unspecified site: M19.90

## 2016-05-13 HISTORY — DX: Hypothyroidism, unspecified: E03.9

## 2016-05-13 HISTORY — DX: Presence of dental prosthetic device (complete) (partial): Z97.2

## 2016-05-13 HISTORY — DX: Essential (primary) hypertension: I10

## 2016-05-13 HISTORY — DX: Sciatica, right side: M54.31

## 2016-05-13 HISTORY — DX: Cardiac arrhythmia, unspecified: I49.9

## 2016-05-13 HISTORY — DX: Low back pain, unspecified: M54.50

## 2016-05-13 HISTORY — DX: Low back pain: M54.5

## 2016-05-13 HISTORY — DX: Fibromyalgia: M79.7

## 2016-05-13 HISTORY — PX: COLONOSCOPY WITH PROPOFOL: SHX5780

## 2016-05-13 HISTORY — DX: Other chronic pain: G89.29

## 2016-05-13 HISTORY — PX: POLYPECTOMY: SHX5525

## 2016-05-13 SURGERY — COLONOSCOPY WITH PROPOFOL
Anesthesia: Monitor Anesthesia Care | Site: Rectum | Wound class: Contaminated

## 2016-05-13 MED ORDER — PROPOFOL 10 MG/ML IV BOLUS
INTRAVENOUS | Status: DC | PRN
  Administered 2016-05-13 (×4): 50 mg via INTRAVENOUS

## 2016-05-13 MED ORDER — LIDOCAINE HCL (CARDIAC) 20 MG/ML IV SOLN
INTRAVENOUS | Status: DC | PRN
  Administered 2016-05-13: 50 mg via INTRAVENOUS

## 2016-05-13 MED ORDER — LACTATED RINGERS IV SOLN
INTRAVENOUS | Status: DC
  Administered 2016-05-13: 09:00:00 via INTRAVENOUS

## 2016-05-13 MED ORDER — STERILE WATER FOR IRRIGATION IR SOLN
Status: DC | PRN
  Administered 2016-05-13: 10:00:00

## 2016-05-13 SURGICAL SUPPLY — 23 items
CANISTER SUCT 1200ML W/VALVE (MISCELLANEOUS) ×3 IMPLANT
CLIP HMST 235XBRD CATH ROT (MISCELLANEOUS) IMPLANT
CLIP RESOLUTION 360 11X235 (MISCELLANEOUS)
FCP ESCP3.2XJMB 240X2.8X (MISCELLANEOUS)
FORCEPS BIOP RAD 4 LRG CAP 4 (CUTTING FORCEPS) IMPLANT
FORCEPS BIOP RJ4 240 W/NDL (MISCELLANEOUS)
FORCEPS ESCP3.2XJMB 240X2.8X (MISCELLANEOUS) IMPLANT
GOWN CVR UNV OPN BCK APRN NK (MISCELLANEOUS) ×2 IMPLANT
GOWN ISOL THUMB LOOP REG UNIV (MISCELLANEOUS) ×4
INJECTOR VARIJECT VIN23 (MISCELLANEOUS) IMPLANT
KIT DEFENDO VALVE AND CONN (KITS) IMPLANT
KIT ENDO PROCEDURE OLY (KITS) ×3 IMPLANT
MARKER SPOT ENDO TATTOO 5ML (MISCELLANEOUS) IMPLANT
PAD GROUND ADULT SPLIT (MISCELLANEOUS) ×3 IMPLANT
PROBE APC STR FIRE (PROBE) IMPLANT
RETRIEVER NET ROTH 2.5X230 LF (MISCELLANEOUS) IMPLANT
SNARE SHORT THROW 13M SML OVAL (MISCELLANEOUS) ×3 IMPLANT
SNARE SHORT THROW 30M LRG OVAL (MISCELLANEOUS) IMPLANT
SNARE SNG USE RND 15MM (INSTRUMENTS) IMPLANT
SPOT EX ENDOSCOPIC TATTOO (MISCELLANEOUS)
TRAP ETRAP POLY (MISCELLANEOUS) ×3 IMPLANT
VARIJECT INJECTOR VIN23 (MISCELLANEOUS)
WATER STERILE IRR 250ML POUR (IV SOLUTION) ×3 IMPLANT

## 2016-05-13 NOTE — Anesthesia Preprocedure Evaluation (Signed)
Anesthesia Evaluation  Patient identified by MRN, date of birth, ID band Patient awake    Airway Mallampati: II  TM Distance: >3 FB Neck ROM: Full    Dental   Pulmonary asthma ,    Pulmonary exam normal        Cardiovascular hypertension, Pt. on medications + dysrhythmias  Rhythm:Regular Rate:Normal  Pt endorses episodes of "heart racing", about once a week. These episodes are occasionally associated with chest pain. No cardiologist.    Neuro/Psych Depression    GI/Hepatic GERD  Medicated,  Endo/Other  Hypothyroidism   Renal/GU      Musculoskeletal   Abdominal   Peds  Hematology   Anesthesia Other Findings EKG reviewed, PVC present  Reproductive/Obstetrics                             Anesthesia Physical Anesthesia Plan  ASA: II  Anesthesia Plan: MAC   Post-op Pain Management:    Induction: Intravenous  Airway Management Planned: Natural Airway  Additional Equipment:   Intra-op Plan:   Post-operative Plan:   Informed Consent: I have reviewed the patients History and Physical, chart, labs and discussed the procedure including the risks, benefits and alternatives for the proposed anesthesia with the patient or authorized representative who has indicated his/her understanding and acceptance.     Plan Discussed with: CRNA  Anesthesia Plan Comments:         Anesthesia Quick Evaluation

## 2016-05-13 NOTE — Transfer of Care (Signed)
Immediate Anesthesia Transfer of Care Note  Patient: Sonya Harper  Procedure(s) Performed: Procedure(s): COLONOSCOPY WITH PROPOFOL (N/A) POLYPECTOMY (N/A)  Patient Location: PACU  Anesthesia Type: MAC  Level of Consciousness: awake, alert  and patient cooperative  Airway and Oxygen Therapy: Patient Spontanous Breathing and Patient connected to supplemental oxygen  Post-op Assessment: Post-op Vital signs reviewed, Patient's Cardiovascular Status Stable, Respiratory Function Stable, Patent Airway and No signs of Nausea or vomiting  Post-op Vital Signs: Reviewed and stable  Complications: No apparent anesthesia complications

## 2016-05-13 NOTE — Anesthesia Postprocedure Evaluation (Signed)
Anesthesia Post Note  Patient: Sonya Harper  Procedure(s) Performed: Procedure(s) (LRB): COLONOSCOPY WITH PROPOFOL (N/A) POLYPECTOMY (N/A)  Patient location during evaluation: PACU Anesthesia Type: MAC Level of consciousness: awake and alert Pain management: pain level controlled Vital Signs Assessment: post-procedure vital signs reviewed and stable Respiratory status: spontaneous breathing, nonlabored ventilation, respiratory function stable and patient connected to nasal cannula oxygen Cardiovascular status: stable and blood pressure returned to baseline Anesthetic complications: no    Marshell Levan

## 2016-05-13 NOTE — Op Note (Signed)
[facility] Gastroenterology Patient Name: Sonya Harper Procedure Date: 05/13/2016 10:10 AM MRN: [id number] Account #: [id number] Date of Birth: 11-23-1945 Admit Type: Outpatient Age: 70 Room: [facility] OR ROOM 01 Gender: Female Note Status: Finalized Procedure:            Colonoscopy Indications:          Family history of colon cancer in a first-degree                        relative Providers:            Lucilla Lame MD, MD Referring MD:         Ashok Norris, MD (Referring MD) Medicines:            Propofol per Anesthesia Complications:        No immediate complications. Procedure:            Pre-Anesthesia Assessment:                       - Prior to the procedure, a History and Physical was                        performed, and patient medications and allergies were                        reviewed. The patient's tolerance of previous                        anesthesia was also reviewed. The risks and benefits of                        the procedure and the sedation options and risks were                        discussed with the patient. All questions were                        answered, and informed consent was obtained. Prior                        Anticoagulants: The patient has taken no previous                        anticoagulant or antiplatelet agents. ASA Grade                        Assessment: II - A patient with mild systemic disease.                        After reviewing the risks and benefits, the patient was                        deemed in satisfactory condition to undergo the                        procedure.                       After obtaining informed consent, the colonoscope was  passed under direct vision. Throughout the procedure,                        the patient's blood pressure, pulse, and oxygen                        saturations were monitored continuously. The Olympus CF                        H180AL  colonoscope (S#: [id number]) was introduced through                        the anus and advanced to the the cecum, identified by                        appendiceal orifice and ileocecal valve. The                        colonoscopy was performed without difficulty. The                        patient tolerated the procedure well. The quality of                        the bowel preparation was excellent. Findings:      The perianal and digital rectal examinations were normal.      A 7 mm polyp was found in the ascending colon. The polyp was       pedunculated. The polyp was removed with a hot snare. Resection and       retrieval were complete.      A 4 mm polyp was found in the ascending colon. The polyp was sessile.       The polyp was removed with a cold snare. Resection and retrieval were       complete.      A 4 mm polyp was found in the transverse colon. The polyp was       pedunculated. The polyp was removed with a hot snare. Resection and       retrieval were complete.      A 3 mm polyp was found in the transverse colon. The polyp was sessile.       The polyp was removed with a cold snare. Resection and retrieval were       complete.      A 3 mm polyp was found in the descending colon. The polyp was sessile.       The polyp was removed with a cold snare. Resection and retrieval were       complete.      Non-bleeding internal hemorrhoids were found during retroflexion. The       hemorrhoids were Grade I (internal hemorrhoids that do not prolapse).      Multiple small-mouthed diverticula were found in the sigmoid colon. Impression:           - One 7 mm polyp in the ascending colon, removed with a                        hot snare. Resected and retrieved.                       - One  4 mm polyp in the ascending colon, removed with a                        cold snare. Resected and retrieved.                       - One 4 mm polyp in the transverse colon, removed with                         a hot snare. Resected and retrieved.                       - One 3 mm polyp in the transverse colon, removed with                        a cold snare. Resected and retrieved.                       - One 3 mm polyp in the descending colon, removed with                        a cold snare. Resected and retrieved.                       - Non-bleeding internal hemorrhoids.                       - Diverticulosis in the sigmoid colon. Recommendation:       - Discharge patient to home.                       - Resume previous diet.                       - Continue present medications.                       - Await pathology results. Procedure Code(s):    --- Professional ---                       [phone], Colonoscopy, flexible; with removal of tumor(s),                        polyp(s), or other lesion(s) by snare technique Diagnosis Code(s):    --- Professional ---                       Z80.0, Family history of malignant neoplasm of                        digestive organs                       D12.2, Benign neoplasm of ascending colon                       D12.3, Benign neoplasm of transverse colon (hepatic                        flexure or splenic flexure)  D12.4, Benign neoplasm of descending colon CPT copyright 2016 American Medical Association. All rights reserved. The codes documented in this report are preliminary and upon coder review may  be revised to meet current compliance requirements. Lucilla Lame MD, MD 05/13/2016 10:32:09 AM This report has been signed electronically. Number of Addenda: 0 Note Initiated On: 05/13/2016 10:10 AM Scope Withdrawal Time: 0 hours 8 minutes 13 seconds  Total Procedure Duration: 0 hours 10 minutes 29 seconds       [facility]

## 2016-05-13 NOTE — Anesthesia Procedure Notes (Signed)
Procedure Name: MAC Performed by: Omarr Hann Pre-anesthesia Checklist: Patient identified, Emergency Drugs available, Suction available, Timeout performed and Patient being monitored Patient Re-evaluated:Patient Re-evaluated prior to inductionOxygen Delivery Method: Nasal cannula Placement Confirmation: positive ETCO2     

## 2016-05-13 NOTE — H&P (Addendum)
Sonya Lame, MD [facility] [address] [place], [place] [address] Phone: [phone] Fax : [phone]  Primary Care Physician:  Ashok Norris, MD Primary Gastroenterologist:  Dr. Allen Norris  Pre-Procedure History & Physical: HPI:  Sonya Harper is a 70 y.o. female is here for a family history of colon ca.   Past Medical History:  Diagnosis Date  . Allergy   . Arthritis    feet  . Asthma   . Breast neoplasm   . Chronic lower back pain    s/p fall injury  . Depression   . Dysrhythmia   . Fibromyalgia   . Hypertension   . Hypothyroidism   . Sciatica of right side   . Wears dentures    partial upper and lower    Past Surgical History:  Procedure Laterality Date  . ABDOMINAL HYSTERECTOMY    . BREAST SURGERY      Prior to Admission medications   Medication Sig Start Date End Date Taking? Authorizing Provider  albuterol (PROVENTIL HFA;VENTOLIN HFA) 108 (90 BASE) MCG/ACT inhaler Inhale 2 puffs into the lungs every 6 (six) hours as needed for wheezing or shortness of breath (or cough). 07/09/15  Yes Wilhelmina Mcardle, MD  carvedilol (COREG CR) 80 MG 24 hr capsule Take 1 capsule (80 mg total) by mouth daily. 12/22/15  Yes Ashok Norris, MD  esomeprazole (NEXIUM) 40 MG packet Take 40 mg by mouth daily before breakfast. 07/09/15  Yes Wilhelmina Mcardle, MD  Fluticasone Furoate-Vilanterol (BREO ELLIPTA) 100-25 MCG/INH AEPB Inhale 1 puff into the lungs daily. 07/09/15  Yes Wilhelmina Mcardle, MD  SYNTHROID 50 MCG tablet TAKE 1 BY MOUTH DAILY. DO NOT SUBSTITUTE 12/04/14  Yes Historical Provider, MD    Allergies as of 04/07/2016 - Review Complete 04/07/2016  Allergen Reaction Noted  . Molds & smuts Anaphylaxis 04/07/2016  . Penicillins Other (See Comments) 03/03/2015    Family History  Problem Relation Age of Onset  . Stroke Mother   . Breast cancer Sister 12  . Colon cancer Paternal Aunt     Social History   Social History  . Marital status: Married    Spouse name: N/A    . Number of children: N/A  . Years of education: N/A   Occupational History  . Not on file.   Social History Main Topics  . Smoking status: Never Smoker  . Smokeless tobacco: Never Used  . Alcohol use No  . Drug use: No  . Sexual activity: Not on file   Other Topics Concern  . Not on file   Social History Narrative  . No narrative on file    Review of Systems: See HPI, otherwise negative ROS  Physical Exam: BP (!) 152/83   Pulse 74   Temp 97 F (36.1 C) (Temporal)   Resp 16   Ht 5\' 6"  (1.676 m)   Wt 196 lb (88.9 kg)   SpO2 95%   BMI 31.64 kg/m  General:   Alert,  pleasant and cooperative in NAD Head:  Normocephalic and atraumatic. Neck:  Supple; no masses or thyromegaly. Lungs:  Clear throughout to auscultation.    Heart:  Regular rate and rhythm. Abdomen:  Soft, nontender and nondistended. Normal bowel sounds, without guarding, and without rebound.   Neurologic:  Alert and  oriented x4;  grossly normal neurologically.  Impression/Plan: Sonya Harper is now here to undergo a family history colonoscopy.  Risks, benefits, and alternatives regarding colonoscopy have been reviewed with the patient.  Questions have been answered.  All parties agreeable.

## 2016-05-16 ENCOUNTER — Encounter: Payer: Self-pay | Admitting: Gastroenterology

## 2016-05-17 ENCOUNTER — Encounter: Payer: Self-pay | Admitting: Gastroenterology

## 2016-05-17 DIAGNOSIS — E042 Nontoxic multinodular goiter: Secondary | ICD-10-CM | POA: Diagnosis not present

## 2016-05-26 ENCOUNTER — Ambulatory Visit
Admission: RE | Admit: 2016-05-26 | Discharge: 2016-05-26 | Disposition: A | Discharge status: Home / Self Care | Payer: Medicare Other | Source: Ambulatory Visit | Attending: Family Medicine | Admitting: Family Medicine

## 2016-05-30 DIAGNOSIS — T07 Unspecified multiple injuries: Secondary | ICD-10-CM | POA: Diagnosis not present

## 2016-05-30 DIAGNOSIS — W19XXXS Unspecified fall, sequela: Secondary | ICD-10-CM | POA: Diagnosis not present

## 2016-05-30 DIAGNOSIS — M545 Low back pain: Secondary | ICD-10-CM | POA: Diagnosis not present

## 2016-06-09 ENCOUNTER — Ambulatory Visit
Admission: RE | Admit: 2016-06-09 | Discharge: 2016-06-09 | Disposition: A | Discharge status: Home / Self Care | Payer: Medicare Other | Source: Ambulatory Visit | Attending: Family Medicine | Admitting: Family Medicine

## 2016-06-09 DIAGNOSIS — R599 Enlarged lymph nodes, unspecified: Secondary | ICD-10-CM | POA: Diagnosis not present

## 2016-06-09 DIAGNOSIS — R590 Localized enlarged lymph nodes: Secondary | ICD-10-CM | POA: Diagnosis not present

## 2016-06-09 DIAGNOSIS — N6310 Unspecified lump in the right breast, unspecified quadrant: Secondary | ICD-10-CM

## 2016-06-14 DIAGNOSIS — E039 Hypothyroidism, unspecified: Secondary | ICD-10-CM | POA: Diagnosis not present

## 2016-06-15 LAB — SURGICAL PATHOLOGY

## 2016-06-20 ENCOUNTER — Encounter: Payer: Self-pay | Admitting: Diagnostic Radiology

## 2016-06-21 ENCOUNTER — Other Ambulatory Visit: Payer: Self-pay | Admitting: Pulmonary Disease

## 2016-06-28 DIAGNOSIS — E039 Hypothyroidism, unspecified: Secondary | ICD-10-CM | POA: Diagnosis not present

## 2016-06-28 DIAGNOSIS — E042 Nontoxic multinodular goiter: Secondary | ICD-10-CM | POA: Diagnosis not present

## 2016-07-07 ENCOUNTER — Ambulatory Visit (INDEPENDENT_AMBULATORY_CARE_PROVIDER_SITE_OTHER): Payer: Medicare Other | Admitting: Family Medicine

## 2016-07-07 ENCOUNTER — Encounter: Payer: Self-pay | Admitting: Family Medicine

## 2016-07-07 DIAGNOSIS — W109XXA Fall (on) (from) unspecified stairs and steps, initial encounter: Secondary | ICD-10-CM | POA: Insufficient documentation

## 2016-07-07 DIAGNOSIS — W109XXD Fall (on) (from) unspecified stairs and steps, subsequent encounter: Secondary | ICD-10-CM

## 2016-07-07 DIAGNOSIS — I499 Cardiac arrhythmia, unspecified: Secondary | ICD-10-CM | POA: Diagnosis not present

## 2016-07-07 DIAGNOSIS — M545 Low back pain, unspecified: Secondary | ICD-10-CM | POA: Insufficient documentation

## 2016-07-07 DIAGNOSIS — G8929 Other chronic pain: Secondary | ICD-10-CM | POA: Diagnosis not present

## 2016-07-07 DIAGNOSIS — M5441 Lumbago with sciatica, right side: Secondary | ICD-10-CM

## 2016-07-07 MED ORDER — TRAMADOL HCL 50 MG PO TABS: 50.0000 mg | ORAL_TABLET | Freq: Two times a day (BID) | ORAL | 0 refills | Status: DC | PRN

## 2016-07-07 NOTE — Progress Notes (Signed)
Name: Sonya Harper   MRN: [id number]    DOB: Sep 17, 1945   Date:07/07/2016       Progress Note  Subjective  Chief Complaint  Chief Complaint  Patient presents with  . Follow-up    Seen at Urgent Care for fall down stairs, back pain and side pain   . Follow-up    need referral to Cardiology    Fall  The fall occurred while standing (fell down from stairs at her church 3+ weeks ago, landed on hard floor). She fell from a height of 3 to 5 ft. She landed on hard floor. Point of impact: cannot remmeber the point of impact. The pain is present in the right hip and buttocks. The pain is at a severity of 8/10. The symptoms are aggravated by ambulation and cold (worse with walking when she gets up in the morning). Pertinent negatives include no fever, hematuria, numbness or tingling.   Referral To Cardiology: Pt. Has seen a cardiologist at [facility] in the past, has been diagnosed with 'irregular heart beat' during exam at Urgent care when she presented after the fall and was referred to Cardiology. She has been told in the past that she has irregular heart rhythm. She wishes to see Dr. Saralyn Pilar.   Past Medical History:  Diagnosis Date  . Allergy   . Arthritis    feet  . Asthma   . Breast neoplasm   . Chronic lower back pain    s/p fall injury  . Depression   . Dysrhythmia   . Fibromyalgia   . Hypertension   . Hypothyroidism   . Sciatica of right side   . Wears dentures    partial upper and lower    Past Surgical History:  Procedure Laterality Date  . ABDOMINAL HYSTERECTOMY    . BREAST SURGERY    . COLONOSCOPY WITH PROPOFOL N/A 05/13/2016   Procedure: COLONOSCOPY WITH PROPOFOL;  Surgeon: Lucilla Lame, MD;  Location: [place];  Service: Endoscopy;  Laterality: N/A;  . POLYPECTOMY N/A 05/13/2016   Procedure: POLYPECTOMY;  Surgeon: Lucilla Lame, MD;  Location: [place];  Service: Endoscopy;  Laterality: N/A;    Family History  Problem Relation Age of  Onset  . Stroke Mother   . Breast cancer Sister 85  . Colon cancer Paternal Aunt     Social History   Social History  . Marital status: Married    Spouse name: N/A  . Number of children: N/A  . Years of education: N/A   Occupational History  . Not on file.   Social History Main Topics  . Smoking status: Never Smoker  . Smokeless tobacco: Never Used  . Alcohol use No  . Drug use: No  . Sexual activity: Not on file   Other Topics Concern  . Not on file   Social History Narrative  . No narrative on file     Current Outpatient Prescriptions:  .  carvedilol (COREG CR) 80 MG 24 hr capsule, Take 1 capsule (80 mg total) by mouth daily., Disp: 90 capsule, Rfl: 0 .  esomeprazole (NEXIUM) 40 MG packet, Take 40 mg by mouth daily before breakfast., Disp: 30 each, Rfl: 12 .  Fluticasone Furoate-Vilanterol (BREO ELLIPTA) 100-25 MCG/INH AEPB, Inhale 1 puff into the lungs daily., Disp: 1 each, Rfl: 12 .  SYNTHROID 50 MCG tablet, TAKE 1 BY MOUTH DAILY. DO NOT SUBSTITUTE, Disp: , Rfl: 0 .  VENTOLIN HFA 108 (90 Base) MCG/ACT inhaler, INHALE 2  PUFFS EVERY 6 HOURS AS NEEDED FOR SHORTNESS OF BREATH, Disp: 18 each, Rfl: 3  Allergies  Allergen Reactions  . Molds & Smuts Anaphylaxis  . Penicillins Nausea And Vomiting and Rash          Review of Systems  Constitutional: Positive for malaise/fatigue. Negative for chills and fever.  Cardiovascular: Positive for palpitations. Negative for chest pain.  Genitourinary: Positive for frequency (has to go to the bathroom when her heart beats fast). Negative for hematuria.  Musculoskeletal: Positive for back pain and joint pain.  Neurological: Negative for tingling and numbness.     Objective  Vitals:   07/07/16 0827  BP: (!) 142/80  Pulse: 62  Resp: 16  Temp: 98.1 F (36.7 C)  TempSrc: Oral  SpO2: 94%  Weight: 202 lb 3.2 oz (91.7 kg)  Height: 5\' 6"  (1.676 m)    Physical Exam  Constitutional: She is well-developed, well-nourished,  and in no distress.  HENT:  Head: Normocephalic and atraumatic.  Cardiovascular: Normal rate, regular rhythm, S1 normal, S2 normal and normal heart sounds.   No murmur heard. Pulmonary/Chest: Effort normal and breath sounds normal. She has no wheezes.  Musculoskeletal:       Lumbar back: She exhibits tenderness, pain and spasm.       Back:  Skin: Skin is warm, dry and intact.  Psychiatric: Mood, memory, affect and judgment normal.  Nursing note and vitals reviewed.   Assessment & Plan  1. Cardiac arrhythmia, unspecified cardiac arrhythmia type Referral to cardiology for management and workup of cardiac dysrhythmia. - Ambulatory referral to Cardiology  2. Chronic right-sided low back pain with right-sided sciatica Acutely worse since the fall, obtain x-ray of lumbar spine to rule out any osseous injury associated with the fall - DG Lumbar Spine Complete; Future  3. Fall on or from stairs or steps, subsequent encounter Start on tramadol 50 mg twice a day when necessary for relief of low back and right hip pain - traMADol (ULTRAM) 50 MG tablet; Take 1 tablet (50 mg total) by mouth every 12 (twelve) hours as needed.  Dispense: 30 tablet; Refill: 0   Fatmata Legere [facility] 07/07/2016 8:44 AM

## 2016-07-12 ENCOUNTER — Ambulatory Visit
Admission: RE | Admit: 2016-07-12 | Discharge: 2016-07-12 | Disposition: A | Discharge status: Home / Self Care | Payer: Medicare Other | Source: Ambulatory Visit | Attending: Family Medicine | Admitting: Family Medicine

## 2016-07-12 DIAGNOSIS — M5441 Lumbago with sciatica, right side: Secondary | ICD-10-CM | POA: Insufficient documentation

## 2016-07-12 DIAGNOSIS — G8929 Other chronic pain: Secondary | ICD-10-CM

## 2016-07-12 DIAGNOSIS — M545 Low back pain: Secondary | ICD-10-CM | POA: Diagnosis not present

## 2016-09-29 DIAGNOSIS — B349 Viral infection, unspecified: Secondary | ICD-10-CM | POA: Diagnosis not present

## 2016-09-30 ENCOUNTER — Encounter: Payer: Medicare Other | Admitting: Family Medicine

## 2016-10-08 DIAGNOSIS — J22 Unspecified acute lower respiratory infection: Secondary | ICD-10-CM | POA: Diagnosis not present

## 2016-10-10 ENCOUNTER — Other Ambulatory Visit: Payer: Self-pay

## 2016-10-12 ENCOUNTER — Ambulatory Visit: Payer: Medicare Other | Admitting: Family Medicine

## 2016-10-13 ENCOUNTER — Encounter: Payer: Self-pay | Admitting: Family Medicine

## 2016-10-13 ENCOUNTER — Ambulatory Visit (INDEPENDENT_AMBULATORY_CARE_PROVIDER_SITE_OTHER): Payer: Medicare Other | Admitting: Family Medicine

## 2016-10-13 VITALS — BP 140/82 | HR 80 | Temp 97.8°F | Resp 17 | Ht 66.0 in | Wt 199.5 lb

## 2016-10-13 DIAGNOSIS — I499 Cardiac arrhythmia, unspecified: Secondary | ICD-10-CM | POA: Diagnosis not present

## 2016-10-13 MED ORDER — CARVEDILOL PHOSPHATE ER 80 MG PO CP24: 80.0000 mg | ORAL_CAPSULE | Freq: Every day | ORAL | 0 refills | Status: DC

## 2016-10-13 NOTE — Progress Notes (Signed)
Name: Sonya Harper   MRN: [id number]    DOB: June 14, 1946   Date:10/13/2016       Progress Note  Subjective  Chief Complaint  Chief Complaint  Patient presents with  . Medication Refill    HPI  Pt. Presents for medication refill. She is on Carvedilol CR 80 mg daily, initially started many years ago by Dr. Alveta Heimlich for 'irregular heart beat.' She reports having frequent episodes of fast and irregular heart beat, these symptoms have been present for many years. She reportedly never got any type of cardiac work up including Holter monitor or echocardiogram and was never told about the diagnosis. She is taking Coreg CR as prescribed, without any apparent side effects.    Past Medical History:  Diagnosis Date  . Allergy   . Arthritis    feet  . Asthma   . Breast neoplasm   . Chronic lower back pain    s/p fall injury  . Depression   . Dysrhythmia   . Fibromyalgia   . Hypertension   . Hypothyroidism   . Sciatica of right side   . Wears dentures    partial upper and lower    Past Surgical History:  Procedure Laterality Date  . ABDOMINAL HYSTERECTOMY    . BREAST SURGERY    . COLONOSCOPY WITH PROPOFOL N/A 05/13/2016   Procedure: COLONOSCOPY WITH PROPOFOL;  Surgeon: Lucilla Lame, MD;  Location: [place];  Service: Endoscopy;  Laterality: N/A;  . POLYPECTOMY N/A 05/13/2016   Procedure: POLYPECTOMY;  Surgeon: Lucilla Lame, MD;  Location: [place];  Service: Endoscopy;  Laterality: N/A;    Family History  Problem Relation Age of Onset  . Stroke Mother   . Breast cancer Sister 21  . Colon cancer Paternal Aunt     Social History   Social History  . Marital status: Married    Spouse name: N/A  . Number of children: N/A  . Years of education: N/A   Occupational History  . Not on file.   Social History Main Topics  . Smoking status: Never Smoker  . Smokeless tobacco: Never Used  . Alcohol use No  . Drug use: No  . Sexual activity: Not on file   Other  Topics Concern  . Not on file   Social History Narrative  . No narrative on file     Current Outpatient Prescriptions:  .  carvedilol (COREG CR) 80 MG 24 hr capsule, Take 1 capsule (80 mg total) by mouth daily., Disp: 90 capsule, Rfl: 0 .  esomeprazole (NEXIUM) 40 MG packet, Take 40 mg by mouth daily before breakfast., Disp: 30 each, Rfl: 12 .  Fluticasone Furoate-Vilanterol (BREO ELLIPTA) 100-25 MCG/INH AEPB, Inhale 1 puff into the lungs daily., Disp: 1 each, Rfl: 12 .  SYNTHROID 50 MCG tablet, TAKE 1 BY MOUTH DAILY. DO NOT SUBSTITUTE, Disp: , Rfl: 0 .  VENTOLIN HFA 108 (90 Base) MCG/ACT inhaler, INHALE 2 PUFFS EVERY 6 HOURS AS NEEDED FOR SHORTNESS OF BREATH, Disp: 18 each, Rfl: 3 .  traMADol (ULTRAM) 50 MG tablet, Take 1 tablet (50 mg total) by mouth every 12 (twelve) hours as needed. (Patient not taking: Reported on 10/13/2016), Disp: 30 tablet, Rfl: 0  Allergies  Allergen Reactions  . Molds & Smuts Anaphylaxis  . Penicillins Nausea And Vomiting and Rash          Review of Systems  Constitutional: Negative for chills, fever and malaise/fatigue.  Respiratory: Positive for cough and shortness  of breath.   Cardiovascular: Positive for chest pain and palpitations.  Neurological: Positive for dizziness. Negative for headaches.    Objective  Vitals:   10/13/16 1129  BP: 140/82  Pulse: 80  Resp: 17  Temp: 97.8 F (36.6 C)  TempSrc: Oral  SpO2: 96%  Weight: 199 lb 8 oz (90.5 kg)  Height: 5\' 6"  (1.676 m)    Physical Exam  Constitutional: She is oriented to person, place, and time and well-developed, well-nourished, and in no distress.  Cardiovascular: Normal rate, regular rhythm and normal heart sounds.   No murmur heard. Pulmonary/Chest: Effort normal and breath sounds normal. She has no wheezes.  Neurological: She is alert and oriented to person, place, and time.  Psychiatric: Mood, memory, affect and judgment normal.  Nursing note and vitals  reviewed.      Assessment & Plan  1. Cardiac arrhythmia, unspecified cardiac arrhythmia type Normal sinus rhythm on auscultation, we will refer to cardiology for evaluation of the type of arrhythmia. Prescription for carvedilol provided for 30 days. - Ambulatory referral to Cardiology - carvedilol (COREG CR) 80 MG 24 hr capsule; Take 1 capsule (80 mg total) by mouth daily.  Dispense: 30 capsule; Refill: 0   [employer] [facility] 10/13/2016 11:33 AM

## 2016-10-24 ENCOUNTER — Encounter: Payer: Self-pay | Admitting: *Deleted

## 2016-10-24 ENCOUNTER — Telehealth: Payer: Self-pay

## 2016-10-24 NOTE — Telephone Encounter (Signed)
I spoke with Sonya Harper since this patient was out and I asked him to give her a message, with [facility]'s number [phone]), so she can get set up for an appointment.

## 2016-11-13 ENCOUNTER — Other Ambulatory Visit: Payer: Self-pay | Admitting: Family Medicine

## 2016-11-13 DIAGNOSIS — I499 Cardiac arrhythmia, unspecified: Secondary | ICD-10-CM

## 2016-11-16 DIAGNOSIS — R0602 Shortness of breath: Secondary | ICD-10-CM | POA: Insufficient documentation

## 2016-11-16 DIAGNOSIS — E782 Mixed hyperlipidemia: Secondary | ICD-10-CM | POA: Diagnosis not present

## 2016-11-16 DIAGNOSIS — I10 Essential (primary) hypertension: Secondary | ICD-10-CM | POA: Insufficient documentation

## 2016-11-16 DIAGNOSIS — I208 Other forms of angina pectoris: Secondary | ICD-10-CM | POA: Diagnosis not present

## 2016-11-16 DIAGNOSIS — I2089 Other forms of angina pectoris: Secondary | ICD-10-CM | POA: Insufficient documentation

## 2016-11-16 DIAGNOSIS — R002 Palpitations: Secondary | ICD-10-CM | POA: Diagnosis not present

## 2016-12-21 DIAGNOSIS — I10 Essential (primary) hypertension: Secondary | ICD-10-CM | POA: Diagnosis not present

## 2016-12-21 DIAGNOSIS — R0602 Shortness of breath: Secondary | ICD-10-CM | POA: Diagnosis not present

## 2016-12-21 DIAGNOSIS — I208 Other forms of angina pectoris: Secondary | ICD-10-CM | POA: Diagnosis not present

## 2016-12-21 DIAGNOSIS — R0989 Other specified symptoms and signs involving the circulatory and respiratory systems: Secondary | ICD-10-CM | POA: Diagnosis not present

## 2016-12-21 DIAGNOSIS — E039 Hypothyroidism, unspecified: Secondary | ICD-10-CM | POA: Diagnosis not present

## 2016-12-21 DIAGNOSIS — E042 Nontoxic multinodular goiter: Secondary | ICD-10-CM | POA: Diagnosis not present

## 2016-12-26 DIAGNOSIS — I10 Essential (primary) hypertension: Secondary | ICD-10-CM | POA: Diagnosis not present

## 2016-12-26 DIAGNOSIS — E782 Mixed hyperlipidemia: Secondary | ICD-10-CM | POA: Diagnosis not present

## 2016-12-26 DIAGNOSIS — R0602 Shortness of breath: Secondary | ICD-10-CM | POA: Diagnosis not present

## 2016-12-26 DIAGNOSIS — I208 Other forms of angina pectoris: Secondary | ICD-10-CM | POA: Diagnosis not present

## 2016-12-28 DIAGNOSIS — E039 Hypothyroidism, unspecified: Secondary | ICD-10-CM | POA: Diagnosis not present

## 2016-12-28 DIAGNOSIS — E042 Nontoxic multinodular goiter: Secondary | ICD-10-CM | POA: Diagnosis not present

## 2016-12-30 IMAGING — MG US  BREAST BX W/ LOC DEV 1ST LESION IMG BX SPEC US GUIDE*R*
1 series · 8 of 8 positions shown · non-contrast
Comparison: Previous exam(s).

ADDENDUM:
PATHOLOGY:

DIAGNOSIS:
A. LYMPH NODE, RIGHT AXILLA; ULTRASOUND-GUIDED CORE BIOPSY:
- REACTIVE FOLLICULAR LYMPHOID HYPERPLASIA.
Comment:
This specimen is negative for metastatic carcinoma and granulomatous
inflammation. There is florid follicular hyperplasia.
Flow cytometry was performed and reported separately by [REDACTED]
for Molecular Biology and Pathology, accession no. [id number].
In
summary, no immunophenotypic evidence of a monoclonal B cell or
aberrant
T cell population was detected.
Partial involvement of a lymph node by a lymphoproliferative process
can
be missed by core biopsies. Multiple large cores were submitted in
this
case. Follow-up is recommended to be sure the specimen is
representative; clinical progression would warrant additional tissue
sampling.
CONCORDANT: Yes
I telephoned the patient on 06/15/2016 at [DATE] and discussed these
results and the recommendations stated below. All questions were
answered. The patient denies significant pain or bleeding from the
biopsy site.
RECOMMENDATION:
1. Clinical follow-up is recommended.
2. Bilateral screening mammogram in April 2017.
CLINICAL DATA: 69-year-old female for ultrasound-guided biopsy of
an enlarged right axillary lymph node
EXAM:
ULTRASOUND GUIDED CORE NEEDLE BIOPSY OF A RIGHT AXILLARY NODE

[Series 1: MG view · 0.08mm/px · 8 of 8 slices shown]
[im 1/8]
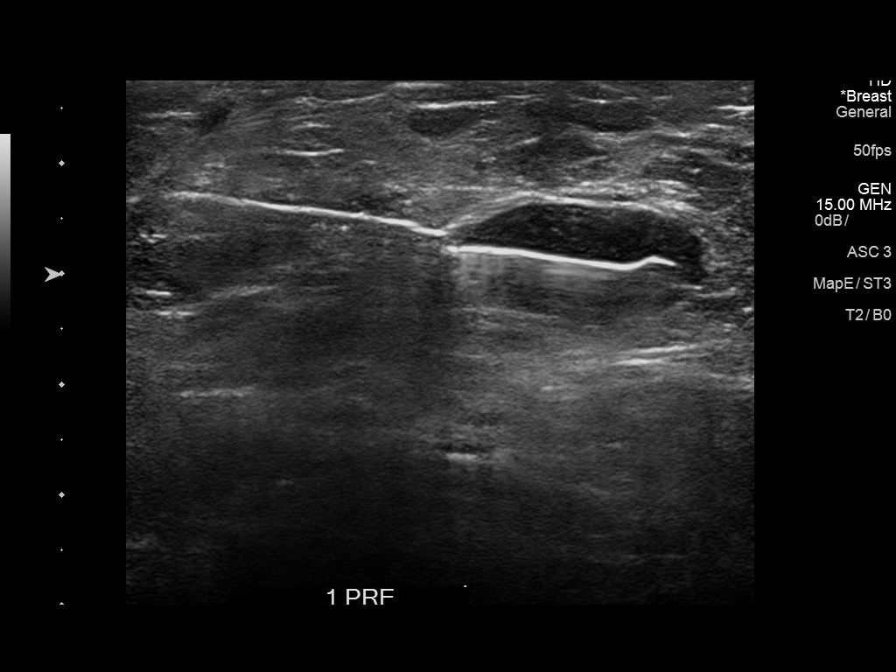
[im 2/8]
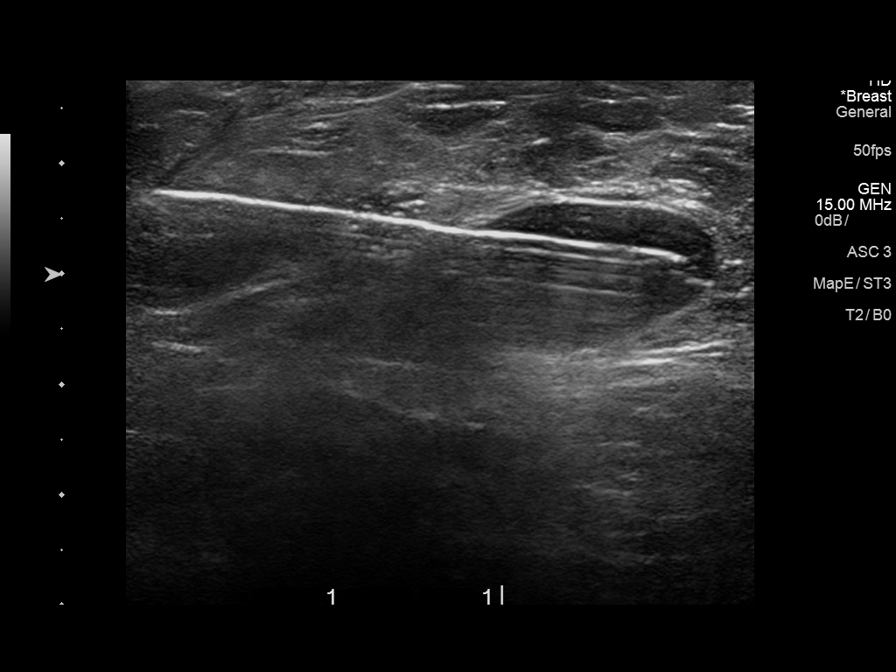
[im 3/8]
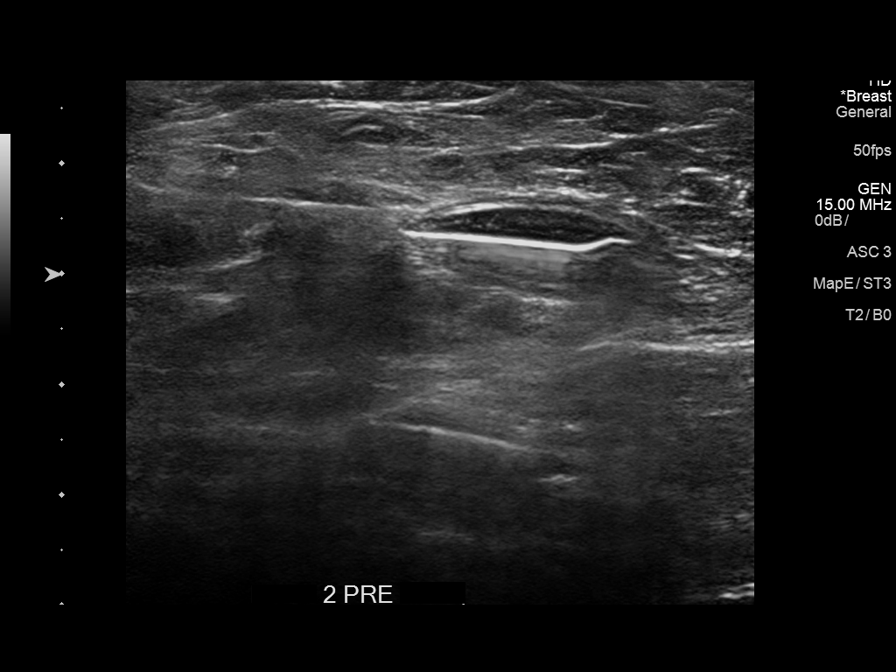
[im 4/8]
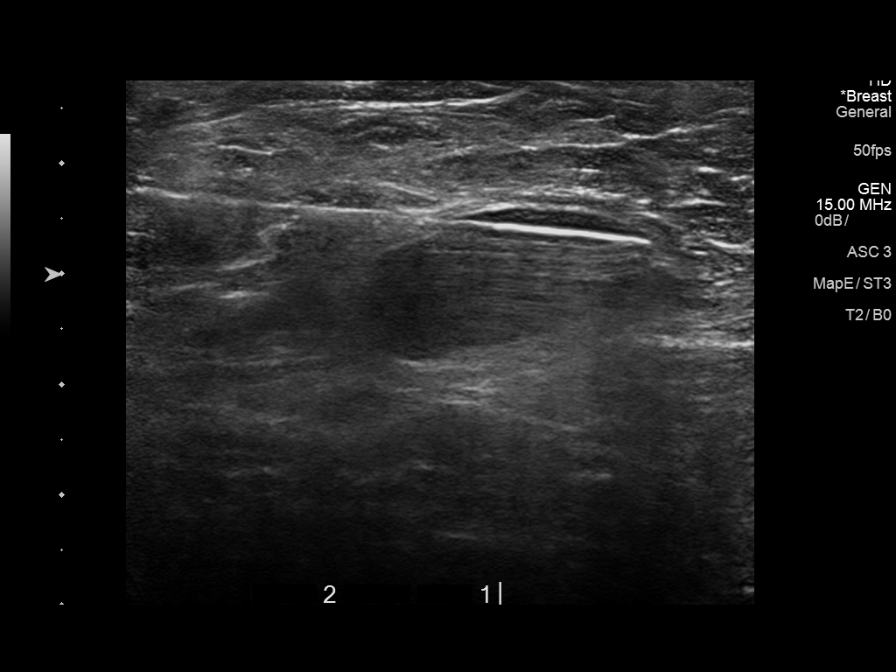
[im 5/8]
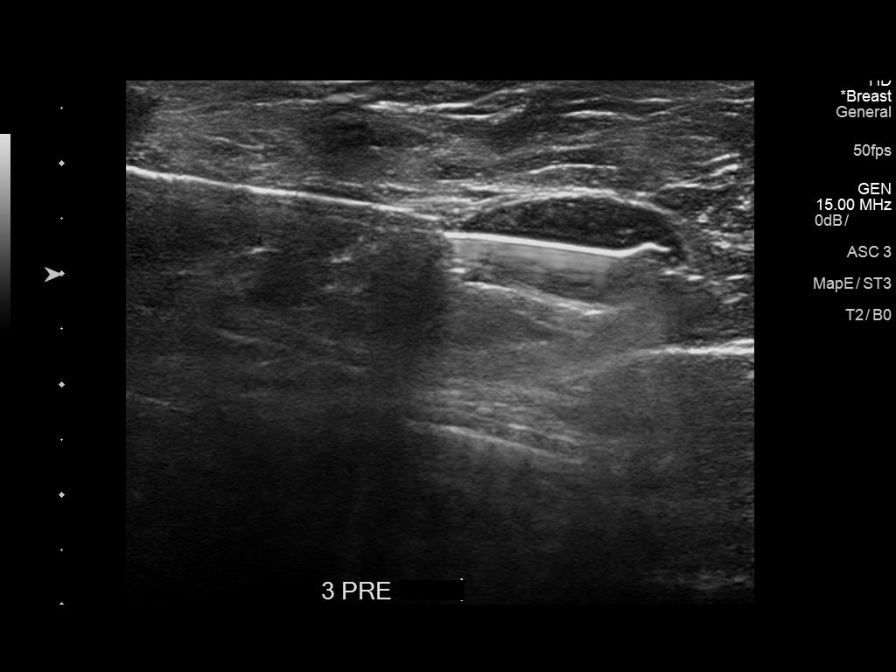
[im 6/8]
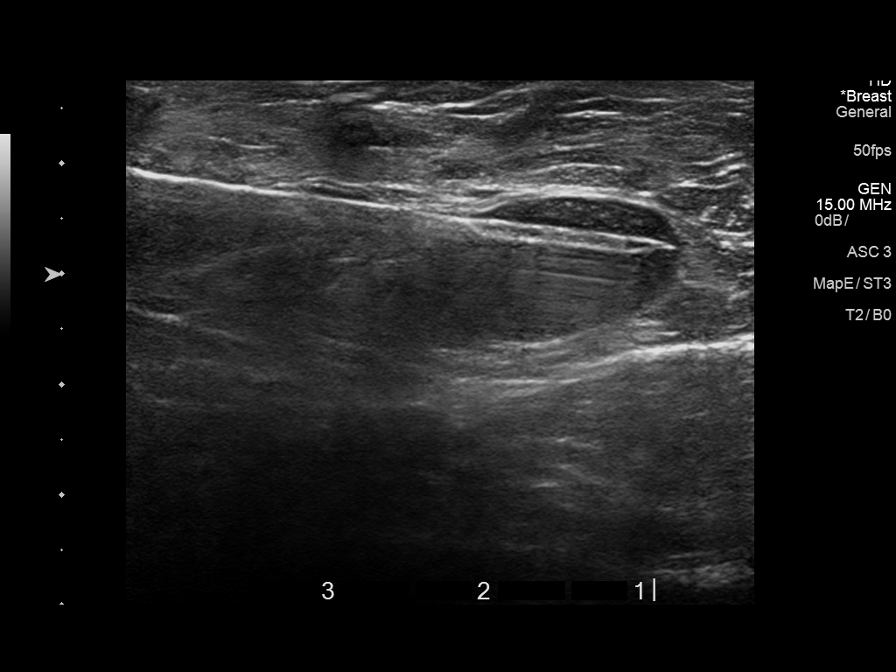
[im 7/8]
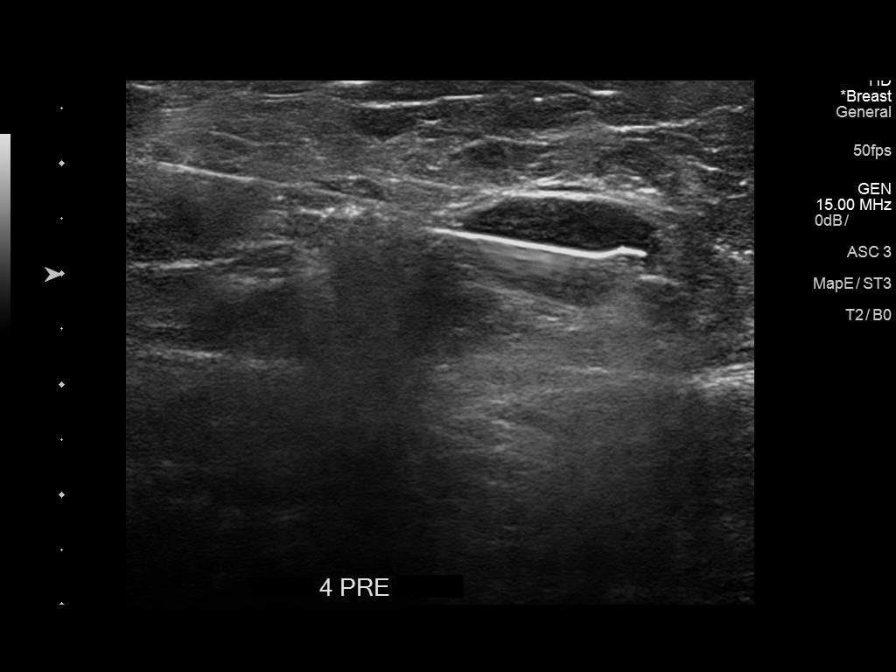
[im 8/8]
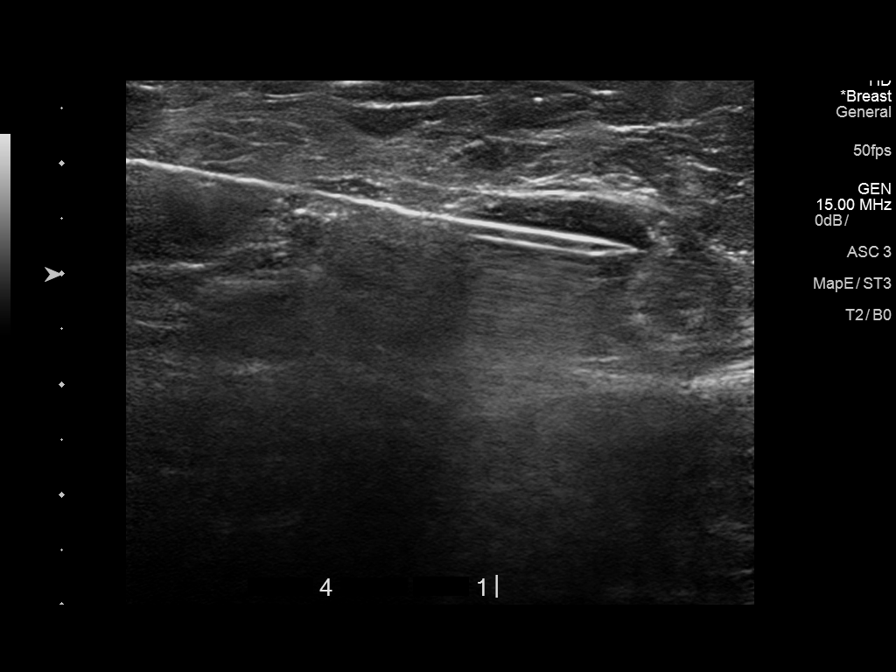

[8 of 8 positions shown; findings below may reference images not displayed]



Using sterile technique and 1% Lidocaine as local anesthetic, under
direct ultrasound visualization, a 14 gauge Dimanche loaded device was
used to perform biopsy of an enlarged right axillary lymph node
using a lateral to medial approach.
IMPRESSION: Ultrasound guided biopsy of an enlarged right axillary lymph node.
No apparent complications.

## 2017-03-03 ENCOUNTER — Encounter: Payer: Medicare Other | Admitting: Family Medicine

## 2017-05-15 ENCOUNTER — Telehealth: Payer: Self-pay | Admitting: Family Medicine

## 2017-05-15 NOTE — Telephone Encounter (Signed)
Dr. Manuella Ghazi, Pt is suppose to return to [facility] in September for a diagnostic follow-up. They are needing a order for it. Please call pt once this has been sent over  [phone]

## 2017-05-15 NOTE — Telephone Encounter (Signed)
Pt is suppose to return to [facility] in September for a diagnostic follow-up. They are needing a order for it. Please call pt once this has been sent over  [phone]

## 2017-05-16 NOTE — Telephone Encounter (Signed)
This patient needs a bilateral screening mammogram based on radiology recommendations. Please schedule for a comprehensive physical exam appointment in one week

## 2017-05-17 NOTE — Telephone Encounter (Signed)
Left voicemail asking patient to call office and schedule CPE within one week for diagnostic breast exam per [facility] and Dr. Manuella Ghazi

## 2017-05-17 NOTE — Telephone Encounter (Signed)
Spoke with pt and she has scheduled appt for Monday (pending the weather)

## 2017-05-22 ENCOUNTER — Encounter: Payer: Medicare Other | Admitting: Family Medicine

## 2017-08-10 ENCOUNTER — Telehealth: Payer: Self-pay

## 2017-08-10 ENCOUNTER — Ambulatory Visit (INDEPENDENT_AMBULATORY_CARE_PROVIDER_SITE_OTHER): Payer: Medicare Other | Admitting: Family Medicine

## 2017-08-10 ENCOUNTER — Encounter: Payer: Self-pay | Admitting: Family Medicine

## 2017-08-10 VITALS — BP 138/80 | HR 69 | Temp 97.7°F | Ht 64.5 in | Wt 199.3 lb

## 2017-08-10 DIAGNOSIS — N952 Postmenopausal atrophic vaginitis: Secondary | ICD-10-CM

## 2017-08-10 DIAGNOSIS — N644 Mastodynia: Secondary | ICD-10-CM

## 2017-08-10 DIAGNOSIS — Z780 Asymptomatic menopausal state: Secondary | ICD-10-CM

## 2017-08-10 DIAGNOSIS — N898 Other specified noninflammatory disorders of vagina: Secondary | ICD-10-CM

## 2017-08-10 DIAGNOSIS — Z23 Encounter for immunization: Secondary | ICD-10-CM

## 2017-08-10 DIAGNOSIS — E669 Obesity, unspecified: Secondary | ICD-10-CM | POA: Insufficient documentation

## 2017-08-10 DIAGNOSIS — Z0000 Encounter for general adult medical examination without abnormal findings: Secondary | ICD-10-CM | POA: Insufficient documentation

## 2017-08-10 DIAGNOSIS — E049 Nontoxic goiter, unspecified: Secondary | ICD-10-CM | POA: Diagnosis not present

## 2017-08-10 DIAGNOSIS — R809 Proteinuria, unspecified: Secondary | ICD-10-CM | POA: Insufficient documentation

## 2017-08-10 LAB — COMPLETE METABOLIC PANEL WITH GFR
AG RATIO: 1.4 (calc) (ref 1.0–2.5)
ALBUMIN MSPROF: 3.7 g/dL (ref 3.6–5.1)
ALT: 10 U/L (ref 6–29)
AST: 15 U/L (ref 10–35)
Alkaline phosphatase (APISO): 94 U/L (ref 33–130)
BUN: 15 mg/dL (ref 7–25)
CALCIUM: 8.8 mg/dL (ref 8.6–10.4)
CO2: 28 mmol/L (ref 20–32)
CREATININE: 0.87 mg/dL (ref 0.60–0.93)
Chloride: 107 mmol/L (ref 98–110)
GFR, EST AFRICAN AMERICAN: 78 mL/min/{1.73_m2} (ref >=60)
GFR, EST NON AFRICAN AMERICAN: 67 mL/min/{1.73_m2} (ref >=60)
GLOBULIN: 2.6 g/dL (ref 1.9–3.7)
Glucose, Bld: 102 mg/dL — ABNORMAL HIGH (ref 65–99)
POTASSIUM: 4.5 mmol/L (ref 3.5–5.3)
SODIUM: 141 mmol/L (ref 135–146)
Total Bilirubin: 0.6 mg/dL (ref 0.2–1.2)
Total Protein: 6.3 g/dL (ref 6.1–8.1)

## 2017-08-10 LAB — CBC WITH DIFFERENTIAL/PLATELET
BASOS ABS: 29 {cells}/uL (ref 0–200)
Basophils Relative: 0.5 %
EOS PCT: 1.9 %
Eosinophils Absolute: 110 cells/uL (ref 15–500)
HEMATOCRIT: 40.4 % (ref 35.0–45.0)
HEMOGLOBIN: 13.3 g/dL (ref 11.7–15.5)
Lymphs Abs: 2227 cells/uL (ref 850–3900)
MCH: 28 pg (ref 27.0–33.0)
MCHC: 32.9 g/dL (ref 32.0–36.0)
MCV: 85.1 fL (ref 80.0–100.0)
MPV: 12.6 fL — ABNORMAL HIGH (ref 7.5–12.5)
Monocytes Relative: 6.7 %
NEUTROS PCT: 52.5 %
Neutro Abs: 3045 cells/uL (ref 1500–7800)
Platelets: 229 10*3/uL (ref 140–400)
RBC: 4.75 10*6/uL (ref 3.80–5.10)
RDW: 13.5 % (ref 11.0–15.0)
Total Lymphocyte: 38.4 %
WBC mixed population: 389 cells/uL (ref 200–950)
WBC: 5.8 10*3/uL (ref 3.8–10.8)

## 2017-08-10 LAB — POCT URINALYSIS DIPSTICK
Bilirubin, UA: NEGATIVE
Glucose, UA: NEGATIVE
KETONES UA: NEGATIVE
Leukocytes, UA: NEGATIVE
Nitrite, UA: NEGATIVE
RBC UA: NEGATIVE
SPEC GRAV UA: 1.025 (ref 1.010–1.025)
UROBILINOGEN UA: 0.2 U/dL
pH, UA: 6 (ref 5.0–8.0)

## 2017-08-10 LAB — TSH: TSH: 1.4 m[IU]/L (ref 0.40–4.50)

## 2017-08-10 LAB — LIPID PANEL
Cholesterol: 148 mg/dL (ref <200)
HDL: 48 mg/dL — ABNORMAL LOW (ref >50)
LDL Cholesterol (Calc): 86 mg/dL (calc)
NON-HDL CHOLESTEROL (CALC): 100 mg/dL (ref <130)
Total CHOL/HDL Ratio: 3.1 (calc) (ref <5.0)
Triglycerides: 57 mg/dL (ref <150)

## 2017-08-10 MED ORDER — FLUTICASONE FUROATE-VILANTEROL 100-25 MCG/INH IN AEPB: 1.0000 | INHALATION_SPRAY | Freq: Every day | RESPIRATORY_TRACT | 2 refills | Status: DC

## 2017-08-10 NOTE — Assessment & Plan Note (Signed)
Noted on exam; patient not sexually active; will not start any sort of estrogen while working up breast issues

## 2017-08-10 NOTE — Progress Notes (Signed)
Patient: Sonya Harper, Female    DOB: 1946/06/15, 71 y.o.   MRN: [id number]  Visit Date: 08/10/2017  Patient ID: Sonya Harper, female   DOB: 1946-02-04, 71 y.o.   MRN: [id number]   Subjective:   Sonya Harper is a 71 y.o. female here for a complete physical exam  Interim issues since last visit: this is my first time seeing her  She had a breast biopsy last year, she had lymph node enlarged; the right breast has pain; the lymph nodes are so large; she had the evaluation at [facility]; they took lymph nodes from under her arm Sister had breast cancer 3x Patient does not have a surgeon for her breast; reviewed the Care Everywhere notes; I do not see a surgeon She is doing her own breast exams; no lumps, but she has pain on the right breast, with lifting in the muscle She coughs at times; she uses cough syrup and it comes down Thyroid issues; working hard on weight loss After the visit, she asked my staff for a handicapped placard for her car; I have nothing int he chart for disability or knee problems or surgery on her knees; she does not use a walker or cane; she says Dr. Rutherford Nail just always gave it to her  USPSTF grade A and B recommendations Depression:  Depression screen [facility] 2/9 08/10/2017 08/10/2017 10/13/2016 07/07/2016 03/03/2015  Decreased Interest 0 0 0 0 0  Down, Depressed, Hopeless 0 0 0 0 0  PHQ - 2 Score 0 0 0 0 0   Hypertension:    BP Readings from Last 3 Encounters:  08/10/17 138/80  10/13/16 140/82  07/07/16 (!) 142/80   Obesity:    Wt Readings from Last 3 Encounters:  08/10/17 199 lb 4.8 oz (90.4 kg)  10/13/16 199 lb 8 oz (90.5 kg)  07/07/16 202 lb 3.2 oz (91.7 kg)      BMI Readings from Last 3 Encounters:       BMI Readings from Last 3 Encounters:  08/10/17 33.68 kg/m  10/13/16 32.20 kg/m  07/07/16 32.64 kg/m    Skin cancer: no worrisome moles Lung cancer:  Never smoker Breast cancer: reviewed last biopsy and mammogram,  ordering today and referring to breast surgeon Colorectal cancer: 2017, next due 2020, lots of family members with colon cancer, cousin at 68 years old  BRCA gene screening: family hx of breast and/or ovarian cancer and/or metastatic prostate cancer? Sister has had breast cancer 3x; father had prostate cancer Cervical cancer screening: s/p hysterectomy for endometriosis, cervix is gone too HIV, hep B, hep C: not interested STD testing and prevention (chl/gon/syphilis): not interested, not sexually active x 15 years Intimate partner violence: no abuse, safe now Contraception: n/a Osteoporosis: order DEXA Fall prevention/vitamin D: discussed  Diet: good eater Exercise: no real exercise, helps her 54 yo father Alcohol: no Tobacco use: no Aspirin: recommended Lipids: check fasting labs today  Review of Systems  Constitutional: Negative for diaphoresis and unexpected weight change.  HENT: Negative for hearing loss.   Eyes: Positive for visual disturbance (cataracts).  Respiratory: Positive for cough (saw a doctor and was told to take inhalers and antacid).   Cardiovascular:       Sees cardiologist for chest pain and irregular heartbeat, thoroughly worked up by cardiologist she says  Gastrointestinal: Negative for blood in stool.  Endocrine: Negative for heat intolerance and polydipsia.  Genitourinary: Negative for hematuria, vaginal bleeding and vaginal discharge.  Has some itching down below; not drinking enough water  Skin:       No worrisome moles  Allergic/Immunologic: Negative for food allergies.  Neurological: Negative for tremors.  Psychiatric/Behavioral: Negative for dysphoric mood.   Objective:      Vitals:   08/10/17 0830  BP: 138/80  Pulse: 69  Temp: 97.7 F (36.5 C)  TempSrc: Oral  SpO2: 97%  Weight: 199 lb 4.8 oz (90.4 kg)  Height: 5' 4.5" (1.638 m)   Body mass index is 33.68 kg/m.    Wt Readings from Last 3 Encounters:  08/10/17 199 lb 4.8 oz  (90.4 kg)  10/13/16 199 lb 8 oz (90.5 kg)  07/07/16 202 lb 3.2 oz (91.7 kg)   Physical Exam  Constitutional: She appears well-developed and well-nourished.  HENT:  Head: Normocephalic and atraumatic.  Right Ear: Hearing, tympanic membrane, external ear and ear canal normal.  Left Ear: Hearing, tympanic membrane, external ear and ear canal normal.  Eyes: Conjunctivae and EOM are normal. Right eye exhibits no hordeolum. Left eye exhibits no hordeolum. No scleral icterus.  Neck: Carotid bruit is not present. No thyromegaly present.  Cardiovascular: Normal rate, regular rhythm, S1 normal, S2 normal and normal heart sounds.  No extrasystoles are present.  Pulmonary/Chest: Effort normal and breath sounds normal. No respiratory distress. Right breast exhibits tenderness. Right breast exhibits no inverted nipple, no mass, no nipple discharge and no skin change. Left breast exhibits tenderness. Left breast exhibits no inverted nipple, no mass, no nipple discharge and no skin change. Breasts are symmetrical.   Abdominal: Soft. Normal appearance and bowel sounds are normal. She exhibits no distension, no abdominal bruit, no pulsatile midline mass and no mass. There is no hepatosplenomegaly. There is no tenderness. No hernia.  Genitourinary: Vaginal discharge found.  Genitourinary Comments: Thin watery discharge; atrophic tissues  Musculoskeletal: Normal range of motion. She exhibits no edema.  Lymphadenopathy:       Head (right side): No submandibular adenopathy present.       Head (left side): No submandibular adenopathy present.    She has no cervical adenopathy.    She has axillary adenopathy.       Right axillary: Lateral (equivocal, fullness and tenderness) adenopathy present.  Neurological: She is alert. She displays no tremor. No cranial nerve deficit. She exhibits normal muscle tone. Gait normal.  Reflex Scores:      Patellar reflexes are 2+ on the right side and 2+ on the left side. Skin:  Skin is warm and dry. No bruising and no ecchymosis noted. No cyanosis. No pallor.  No overlying skin changes, no peau d'orange of the breasts  Psychiatric: Her speech is normal and behavior is normal. Thought content normal. Her mood appears not anxious. She does not exhibit a depressed mood.   Problem List Items Addressed This Visit          Endocrine   Goiter    Check thyroid labs; patient sees endo next week      Relevant Orders   TSH      Genitourinary   Vaginal atrophy    Noted on exam; patient not sexually active; will not start any sort of estrogen while working up breast issues         Other   Preventative health care - Primary    USPSTF grade A and B recommendations reviewed with patient; age-appropriate recommendations, preventive care, screening tests, etc discussed and encouraged; healthy living encouraged; see AVS for patient education given to  patient      Relevant Orders   CBC with Differential/Platelet   COMPLETE METABOLIC PANEL WITH GFR   Lipid panel   Obesity (BMI 30.0-34.9)    She is working on this      Breast pain in female    Reviewed the mammogram and biopsy report; patient says she is not seeing a Pharmacist, hospital; the last mammo report said screening bilateral mammogram due in August, so she is well overdue; will have her see breast surgeon for evaluation and further work-up as indicated      Relevant Orders   Ambulatory referral to General Surgery   MM Digital Diagnostic Bilat   US BREAST LTD UNI LEFT INC AXILLA   US BREAST LTD UNI RIGHT INC AXILLA   Breast pain in female     Reviewed the mammogram and biopsy report; patient says she is not seeing a breast surgeon; the last mammo report said screening bilateral mammogram due in August, so she is well overdue; will have her see breast surgeon for evaluation and further work-up as indicated      Relevant Orders   Ambulatory referral to General Surgery     MM Digital Diagnostic Bilat   US BREAST LTD UNI LEFT INC AXILLA   US BREAST LTD UNI RIGHT INC AXILLA          Other Visit Diagnoses    Post-menopausal       Relevant Orders   DG Bone Density   Vaginal itching       Relevant Orders   POCT Urinalysis Dipstick (Completed)   Follow up plan: Return in about 1 month (around 09/10/2017) for follow-up visit with Dr. Sanda Klein for Medicare Wellness check.  An After Visit Summary was printed and given to the patient.      Deleted by: Arnetha Courser, MD[08/10/2017 10:17 AM]

## 2017-08-10 NOTE — Progress Notes (Deleted)
Patient ID: Sonya Harper, female   DOB: 1946/05/16, 71 y.o.   MRN: [id number]   Subjective:   Sonya Harper is a 71 y.o. female here for a complete physical exam  Interim issues since last visit: this is my first time seeing her  She had a breast biopsy last year, she had lymph node enlarged; the right breast has pain; the lymph nodes are so large; she had the evaluation at [facility]; they took lymph nodes from under her arm Sister had breast cancer 3x Patient does not have a surgeon for her breast; reviewed the Care Everywhere notes; I do not see a surgeon She is doing her own breast exams; no lumps, but she has pain on the right breast, with lifting in the muscle She coughs at times; she uses cough syrup and it comes down Thyroid issues; working hard on weight loss After the visit, she asked my staff for a handicapped placard for her car; I have nothing int he chart for disability or knee problems or surgery on her knees; she does not use a walker or cane; she says Dr. Rutherford Nail just always gave it to her  USPSTF grade A and B recommendations Depression:  Depression screen [facility] 2/9 08/10/2017 08/10/2017 10/13/2016 07/07/2016 03/03/2015  Decreased Interest 0 0 0 0 0  Down, Depressed, Hopeless 0 0 0 0 0  PHQ - 2 Score 0 0 0 0 0   Hypertension: BP Readings from Last 3 Encounters:  08/10/17 138/80  10/13/16 140/82  07/07/16 (!) 142/80   Obesity: Wt Readings from Last 3 Encounters:  08/10/17 199 lb 4.8 oz (90.4 kg)  10/13/16 199 lb 8 oz (90.5 kg)  07/07/16 202 lb 3.2 oz (91.7 kg)   BMI Readings from Last 3 Encounters:  08/10/17 33.68 kg/m  10/13/16 32.20 kg/m  07/07/16 32.64 kg/m    Skin cancer: no worrisome moles Lung cancer:  Never smoker Breast cancer: reviewed last biopsy and mammogram, ordering today and referring to breast surgeon Colorectal cancer: 2017, next due 2020, lots of family members with colon cancer, cousin at 40 years old  BRCA gene screening:  family hx of breast and/or ovarian cancer and/or metastatic prostate cancer? Sister has had breast cancer 3x; father had prostate cancer Cervical cancer screening: s/p hysterectomy for endometriosis, cervix is gone too HIV, hep B, hep C: not interested STD testing and prevention (chl/gon/syphilis): not interested, not sexually active x 15 years Intimate partner violence: no abuse, safe now Contraception: n/a Osteoporosis: order DEXA Fall prevention/vitamin D: discussed  Diet: good eater Exercise: no real exercise, helps her 62 yo father Alcohol: no Tobacco use: no Aspirin: recommended Lipids: check fasting labs today No results found for: CHOL No results found for: HDL No results found for: LDLCALC No results found for: TRIG No results found for: CHOLHDL No results found for: LDLDIRECT Glucose:  Glucose  Date Value Ref Range Status  06/03/2012 117 (H) 65 - 99 mg/dL Final   AAA: no fam hx  Past Medical History:  Diagnosis Date  . Allergy   . Arthritis    feet  . Asthma   . Breast neoplasm   . Chronic lower back pain    s/p fall injury  . Depression   . Dysrhythmia   . Fibromyalgia   . Hypertension   . Hypothyroidism   . Sciatica of right side   . Wears dentures    partial upper and lower   Past Surgical History:  Procedure Laterality  Date  . ABDOMINAL HYSTERECTOMY    . BREAST SURGERY    . COLONOSCOPY WITH PROPOFOL N/A 05/13/2016   Procedure: COLONOSCOPY WITH PROPOFOL;  Surgeon: Lucilla Lame, MD;  Location: [place];  Service: Endoscopy;  Laterality: N/A;  . POLYPECTOMY N/A 05/13/2016   Procedure: POLYPECTOMY;  Surgeon: Lucilla Lame, MD;  Location: [place];  Service: Endoscopy;  Laterality: N/A;   Family History  Problem Relation Age of Onset  . Stroke Mother   . Breast cancer Sister 15  . Colon cancer Paternal Aunt   . Cancer Sister    Social History   Tobacco Use  . Smoking status: Never Smoker  . Smokeless tobacco: Never Used    Substance Use Topics  . Alcohol use: No    Alcohol/week: 0.0 oz  . Drug use: No   Review of Systems  Constitutional: Negative for diaphoresis and unexpected weight change.  HENT: Negative for hearing loss.   Eyes: Positive for visual disturbance (cataracts).  Respiratory: Positive for cough (saw a doctor and was told to take inhalers and antacid).   Cardiovascular:       Sees cardiologist for chest pain and irregular heartbeat, thoroughly worked up by cardiologist she says  Gastrointestinal: Negative for blood in stool.  Endocrine: Negative for heat intolerance and polydipsia.  Genitourinary: Negative for hematuria, vaginal bleeding and vaginal discharge.       Has some itching down below; not drinking enough water  Skin:       No worrisome moles  Allergic/Immunologic: Negative for food allergies.  Neurological: Negative for tremors.  Psychiatric/Behavioral: Negative for dysphoric mood.    Objective:   Vitals:   08/10/17 0830  BP: 138/80  Pulse: 69  Temp: 97.7 F (36.5 C)  TempSrc: Oral  SpO2: 97%  Weight: 199 lb 4.8 oz (90.4 kg)  Height: 5' 4.5" (1.638 m)   Body mass index is 33.68 kg/m. Wt Readings from Last 3 Encounters:  08/10/17 199 lb 4.8 oz (90.4 kg)  10/13/16 199 lb 8 oz (90.5 kg)  07/07/16 202 lb 3.2 oz (91.7 kg)   Physical Exam  Constitutional: She appears well-developed and well-nourished.  HENT:  Head: Normocephalic and atraumatic.  Right Ear: Hearing, tympanic membrane, external ear and ear canal normal.  Left Ear: Hearing, tympanic membrane, external ear and ear canal normal.  Eyes: Conjunctivae and EOM are normal. Right eye exhibits no hordeolum. Left eye exhibits no hordeolum. No scleral icterus.  Neck: Carotid bruit is not present. No thyromegaly present.  Cardiovascular: Normal rate, regular rhythm, S1 normal, S2 normal and normal heart sounds.  No extrasystoles are present.  Pulmonary/Chest: Effort normal and breath sounds normal. No  respiratory distress. Right breast exhibits tenderness. Right breast exhibits no inverted nipple, no mass, no nipple discharge and no skin change. Left breast exhibits tenderness. Left breast exhibits no inverted nipple, no mass, no nipple discharge and no skin change. Breasts are symmetrical.    Abdominal: Soft. Normal appearance and bowel sounds are normal. She exhibits no distension, no abdominal bruit, no pulsatile midline mass and no mass. There is no hepatosplenomegaly. There is no tenderness. No hernia.  Genitourinary: Vaginal discharge found.  Genitourinary Comments: Thin watery discharge; atrophic tissues  Musculoskeletal: Normal range of motion. She exhibits no edema.  Lymphadenopathy:       Head (right side): No submandibular adenopathy present.       Head (left side): No submandibular adenopathy present.    She has no cervical adenopathy.  She has axillary adenopathy.       Right axillary: Lateral (equivocal, fullness and tenderness) adenopathy present.  Neurological: She is alert. She displays no tremor. No cranial nerve deficit. She exhibits normal muscle tone. Gait normal.  Reflex Scores:      Patellar reflexes are 2+ on the right side and 2+ on the left side. Skin: Skin is warm and dry. No bruising and no ecchymosis noted. No cyanosis. No pallor.  No overlying skin changes, no peau d'orange of the breasts  Psychiatric: Her speech is normal and behavior is normal. Thought content normal. Her mood appears not anxious. She does not exhibit a depressed mood.   Assessment/Plan:   Problem List Items Addressed This Visit      Endocrine   Goiter    Check thyroid labs; patient sees endo next week      Relevant Orders   TSH     Genitourinary   Vaginal atrophy    Noted on exam; patient not sexually active; will not start any sort of estrogen while working up breast issues        Other   Preventative health care - Primary    USPSTF grade A and B recommendations reviewed  with patient; age-appropriate recommendations, preventive care, screening tests, etc discussed and encouraged; healthy living encouraged; see AVS for patient education given to patient      Relevant Orders   CBC with Differential/Platelet   COMPLETE METABOLIC PANEL WITH GFR   Lipid panel   Obesity (BMI 30.0-34.9)    She is working on this      Breast pain in female    Reviewed the mammogram and biopsy report; patient says she is not seeing a Pharmacist, hospital; the last mammo report said screening bilateral mammogram due in August, so she is well overdue; will have her see breast surgeon for evaluation and further work-up as indicated      Relevant Orders   Ambulatory referral to General Surgery   MM Digital Diagnostic Bilat   US BREAST LTD UNI LEFT INC AXILLA   US BREAST LTD UNI RIGHT INC AXILLA    Other Visit Diagnoses    Post-menopausal       Relevant Orders   DG Bone Density   Vaginal itching       Relevant Orders   POCT Urinalysis Dipstick (Completed)       Meds ordered this encounter  Medications  . fluticasone furoate-vilanterol (BREO ELLIPTA) 100-25 MCG/INH AEPB    Sig: Inhale 1 puff into the lungs daily.    Dispense:  1 each    Refill:  2   Orders Placed This Encounter  Procedures  . DG Bone Density    Standing Status:   Future    Standing Expiration Date:   10/11/2018    Order Specific Question:   Reason for Exam (SYMPTOM  OR DIAGNOSIS REQUIRED)    Answer:   Screening osteoporsis    Order Specific Question:   Preferred imaging location?    Answer:   [facility]  . MM Digital Diagnostic Bilat    Standing Status:   Future    Standing Expiration Date:   10/11/2018    Order Specific Question:   Reason for Exam (SYMPTOM  OR DIAGNOSIS REQUIRED)    Answer:   bilateral breast pain, laterally, hx breast bx, sister has had breast cancer 3x    Order Specific Question:   Preferred imaging location?    Answer:   [facility]  .  US BREAST LTD UNI LEFT INC AXILLA     Standing Status:   Future    Standing Expiration Date:   10/11/2018    Order Specific Question:   Reason for Exam (SYMPTOM  OR DIAGNOSIS REQUIRED)    Answer:   breast pain lateral aspect, hx breast bx; sister has had breast cancer 3x    Order Specific Question:   Preferred imaging location?    Answer:   [facility]  . US BREAST LTD UNI RIGHT INC AXILLA    Standing Status:   Future    Standing Expiration Date:   10/11/2018    Order Specific Question:   Reason for Exam (SYMPTOM  OR DIAGNOSIS REQUIRED)    Answer:   breast pain laterally in both breasts; hx breast bx; sister has had breast cancer 3x    Order Specific Question:   Preferred imaging location?    Answer:   [facility]  . Pneumococcal polysaccharide vaccine 23-valent greater than or equal to 2yo subcutaneous/IM  . CBC with Differential/Platelet  . COMPLETE METABOLIC PANEL WITH GFR  . Lipid panel  . TSH  . Ambulatory referral to General Surgery    Referral Priority:   Routine    Referral Type:   Surgical    Referral Reason:   Specialty Services Required    Requested Specialty:   General Surgery    Number of Visits Requested:   1  . POCT Urinalysis Dipstick    Follow up plan: Return in about 1 month (around 09/10/2017) for follow-up visit with Dr. Sanda Klein for Medicare Wellness check.  An After Visit Summary was printed and given to the patient.

## 2017-08-10 NOTE — Telephone Encounter (Signed)
-----   Message from Arnetha Courser, MD sent at 08/10/2017  9:39 AM EST ----- Please let the patient know that her urine showed a trace amount of protein. We'll see what her kidney function is on her labs. Let's plan to repeat a urine at her visit in one month. In the meantime, avoid NSAIDs and stay well-hydrated. Try to limit red meat as well. Thank you

## 2017-08-10 NOTE — Patient Instructions (Addendum)
I'll recommend an 81 mg (baby) coated aspirin daily I'll recommend 800 to 1000 iu of vitamin D3 once a day  We'll have you see the surgeon about your breast symptoms and lymph node Please do call to schedule your bone density study; the number to schedule one at either [facility] or [facility] is [phone] or [phone]  Health Maintenance, Female Adopting a healthy lifestyle and getting preventive care can go a long way to promote health and wellness. Talk with your health care provider about what schedule of regular examinations is right for you. This is a good chance for you to check in with your provider about disease prevention and staying healthy. In between checkups, there are plenty of things you can do on your own. Experts have done a lot of research about which lifestyle changes and preventive measures are most likely to keep you healthy. Ask your health care provider for more information. Weight and diet Eat a healthy diet  Be sure to include plenty of vegetables, fruits, low-fat dairy products, and lean protein.  Do not eat a lot of foods high in solid fats, added sugars, or salt.  Get regular exercise. This is one of the most important things you can do for your health. ? Most adults should exercise for at least 150 minutes each week. The exercise should increase your heart rate and make you sweat (moderate-intensity exercise). ? Most adults should also do strengthening exercises at least twice a week. This is in addition to the moderate-intensity exercise.  Maintain a healthy weight  Body mass index (BMI) is a measurement that can be used to identify possible weight problems. It estimates body fat based on height and weight. Your health care provider can help determine your BMI and help you achieve or maintain a healthy weight.  For females 60 years of age and older: ? A BMI below 18.5 is considered underweight. ? A BMI of 18.5 to 24.9 is  normal. ? A BMI of 25 to 29.9 is considered overweight. ? A BMI of 30 and above is considered obese.  Watch levels of cholesterol and blood lipids  You should start having your blood tested for lipids and cholesterol at 70 years of age, then have this test every 5 years.  You may need to have your cholesterol levels checked more often if: ? Your lipid or cholesterol levels are high. ? You are older than 70 years of age. ? You are at high risk for heart disease.  Cancer screening Lung Cancer  Lung cancer screening is recommended for adults 56-62 years old who are at high risk for lung cancer because of a history of smoking.  A yearly low-dose CT scan of the lungs is recommended for people who: ? Currently smoke. ? Have quit within the past 15 years. ? Have at least a 30-pack-year history of smoking. A pack year is smoking an average of one pack of cigarettes a day for 1 year.  Yearly screening should continue until it has been 15 years since you quit.  Yearly screening should stop if you develop a health problem that would prevent you from having lung cancer treatment.  Breast Cancer  Practice breast self-awareness. This means understanding how your breasts normally appear and feel.  It also means doing regular breast self-exams. Let your health care provider know about any changes, no matter how small.  If you are in your 20s or 30s, you should have a clinical  breast exam (CBE) by a health care provider every 1-3 years as part of a regular health exam.  If you are 76 or older, have a CBE every year. Also consider having a breast X-ray (mammogram) every year.  If you have a family history of breast cancer, talk to your health care provider about genetic screening.  If you are at high risk for breast cancer, talk to your health care provider about having an MRI and a mammogram every year.  Breast cancer gene (BRCA) assessment is recommended for women who have family members  with BRCA-related cancers. BRCA-related cancers include: ? Breast. ? Ovarian. ? Tubal. ? Peritoneal cancers.  Results of the assessment will determine the need for genetic counseling and BRCA1 and BRCA2 testing.  Cervical Cancer Your health care provider may recommend that you be screened regularly for cancer of the pelvic organs (ovaries, uterus, and vagina). This screening involves a pelvic examination, including checking for microscopic changes to the surface of your cervix (Pap test). You may be encouraged to have this screening done every 3 years, beginning at age 43.  For women ages 71-65, health care providers may recommend pelvic exams and Pap testing every 3 years, or they may recommend the Pap and pelvic exam, combined with testing for human papilloma virus (HPV), every 5 years. Some types of HPV increase your risk of cervical cancer. Testing for HPV may also be done on women of any age with unclear Pap test results.  Other health care providers may not recommend any screening for nonpregnant women who are considered low risk for pelvic cancer and who do not have symptoms. Ask your health care provider if a screening pelvic exam is right for you.  If you have had past treatment for cervical cancer or a condition that could lead to cancer, you need Pap tests and screening for cancer for at least 20 years after your treatment. If Pap tests have been discontinued, your risk factors (such as having a new sexual partner) need to be reassessed to determine if screening should resume. Some women have medical problems that increase the chance of getting cervical cancer. In these cases, your health care provider may recommend more frequent screening and Pap tests.  Colorectal Cancer  This type of cancer can be detected and often prevented.  Routine colorectal cancer screening usually begins at 70 years of age and continues through 70 years of age.  Your health care provider may recommend  screening at an earlier age if you have risk factors for colon cancer.  Your health care provider may also recommend using home test kits to check for hidden blood in the stool.  A small camera at the end of a tube can be used to examine your colon directly (sigmoidoscopy or colonoscopy). This is done to check for the earliest forms of colorectal cancer.  Routine screening usually begins at age 86.  Direct examination of the colon should be repeated every 5-10 years through 70 years of age. However, you may need to be screened more often if early forms of precancerous polyps or small growths are found.  Skin Cancer  Check your skin from head to toe regularly.  Tell your health care provider about any new moles or changes in moles, especially if there is a change in a mole's shape or color.  Also tell your health care provider if you have a mole that is larger than the size of a pencil eraser.  Always use sunscreen. Apply  sunscreen liberally and repeatedly throughout the day.  Protect yourself by wearing long sleeves, pants, a wide-brimmed hat, and sunglasses whenever you are outside.  Heart disease, diabetes, and high blood pressure  High blood pressure causes heart disease and increases the risk of stroke. High blood pressure is more likely to develop in: ? People who have blood pressure in the high end of the normal range (130-139/85-89 mm Hg). ? People who are overweight or obese. ? People who are African American.  If you are 4-62 years of age, have your blood pressure checked every 3-5 years. If you are 67 years of age or older, have your blood pressure checked every year. You should have your blood pressure measured twice-once when you are at a hospital or clinic, and once when you are not at a hospital or clinic. Record the average of the two measurements. To check your blood pressure when you are not at a hospital or clinic, you can use: ? An automated blood pressure machine at  a pharmacy. ? A home blood pressure monitor.  If you are between 41 years and 75 years old, ask your health care provider if you should take aspirin to prevent strokes.  Have regular diabetes screenings. This involves taking a blood sample to check your fasting blood sugar level. ? If you are at a normal weight and have a low risk for diabetes, have this test once every three years after 70 years of age. ? If you are overweight and have a high risk for diabetes, consider being tested at a younger age or more often. Preventing infection Hepatitis B  If you have a higher risk for hepatitis B, you should be screened for this virus. You are considered at high risk for hepatitis B if: ? You were born in a country where hepatitis B is common. Ask your health care provider which countries are considered high risk. ? Your parents were born in a high-risk country, and you have not been immunized against hepatitis B (hepatitis B vaccine). ? You have HIV or AIDS. ? You use needles to inject street drugs. ? You live with someone who has hepatitis B. ? You have had sex with someone who has hepatitis B. ? You get hemodialysis treatment. ? You take certain medicines for conditions, including cancer, organ transplantation, and autoimmune conditions.  Hepatitis C  Blood testing is recommended for: ? Everyone born from [birth year] through 1964. ? Anyone with known risk factors for hepatitis C.  Sexually transmitted infections (STIs)  You should be screened for sexually transmitted infections (STIs) including gonorrhea and chlamydia if: ? You are sexually active and are younger than 70 years of age. ? You are older than 70 years of age and your health care provider tells you that you are at risk for this type of infection. ? Your sexual activity has changed since you were last screened and you are at an increased risk for chlamydia or gonorrhea. Ask your health care provider if you are at risk.  If you do  not have HIV, but are at risk, it may be recommended that you take a prescription medicine daily to prevent HIV infection. This is called pre-exposure prophylaxis (PrEP). You are considered at risk if: ? You are sexually active and do not regularly use condoms or know the HIV status of your partner(s). ? You take drugs by injection. ? You are sexually active with a partner who has HIV.  Talk with your health care provider  about whether you are at high risk of being infected with HIV. If you choose to begin PrEP, you should first be tested for HIV. You should then be tested every 3 months for as long as you are taking PrEP. Pregnancy  If you are premenopausal and you may become pregnant, ask your health care provider about preconception counseling.  If you may become pregnant, take 400 to 800 micrograms (mcg) of folic acid every day.  If you want to prevent pregnancy, talk to your health care provider about birth control (contraception). Osteoporosis and menopause  Osteoporosis is a disease in which the bones lose minerals and strength with aging. This can result in serious bone fractures. Your risk for osteoporosis can be identified using a bone density scan.  If you are 64 years of age or older, or if you are at risk for osteoporosis and fractures, ask your health care provider if you should be screened.  Ask your health care provider whether you should take a calcium or vitamin D supplement to lower your risk for osteoporosis.  Menopause may have certain physical symptoms and risks.  Hormone replacement therapy may reduce some of these symptoms and risks. Talk to your health care provider about whether hormone replacement therapy is right for you. Follow these instructions at home:  Schedule regular health, dental, and eye exams.  Stay current with your immunizations.  Do not use any tobacco products including cigarettes, chewing tobacco, or electronic cigarettes.  If you are  pregnant, do not drink alcohol.  If you are breastfeeding, limit how much and how often you drink alcohol.  Limit alcohol intake to no more than 1 drink per day for nonpregnant women. One drink equals 12 ounces of beer, 5 ounces of wine, or 1 ounces of hard liquor.  Do not use street drugs.  Do not share needles.  Ask your health care provider for help if you need support or information about quitting drugs.  Tell your health care provider if you often feel depressed.  Tell your health care provider if you have ever been abused or do not feel safe at home. This information is not intended to replace advice given to you by your health care provider. Make sure you discuss any questions you have with your health care provider. Document Released: 03/07/2011 Document Revised: 01/28/2016 Document Reviewed: 05/26/2015 Elsevier Interactive Patient Education  [employer].

## 2017-08-10 NOTE — Telephone Encounter (Signed)
Pt returns call informed her of UA results per Result note by Dr.Lada. Pt gave verbal understanding.

## 2017-08-10 NOTE — Assessment & Plan Note (Signed)
USPSTF grade A and B recommendations reviewed with patient; age-appropriate recommendations, preventive care, screening tests, etc discussed and encouraged; healthy living encouraged; see AVS for patient education given to patient  

## 2017-08-10 NOTE — Addendum Note (Signed)
Addended by: Donalee Citrin on: 08/10/2017 12:01 PM   Modules accepted: Orders

## 2017-08-10 NOTE — Assessment & Plan Note (Signed)
Reviewed the mammogram and biopsy report; patient says she is not seeing a breast surgeon; the last mammo report said screening bilateral mammogram due in August, so she is well overdue; will have her see breast surgeon for evaluation and further work-up as indicated

## 2017-08-10 NOTE — Assessment & Plan Note (Signed)
She is working on this 

## 2017-08-10 NOTE — Telephone Encounter (Signed)
Called pt, husband answers. LM w/ husband for pt to call back. CRM created.

## 2017-08-10 NOTE — Assessment & Plan Note (Addendum)
Check thyroid labs; patient sees endo next week

## 2017-08-11 ENCOUNTER — Other Ambulatory Visit: Payer: Self-pay | Admitting: Family Medicine

## 2017-08-11 ENCOUNTER — Telehealth: Payer: Self-pay

## 2017-08-11 LAB — WET PREP BY MOLECULAR PROBE
Candida species: DETECTED — AB
Gardnerella vaginalis: NOT DETECTED
MICRO NUMBER: 81374853
SPECIMEN QUALITY:: ADEQUATE
TRICHOMONAS VAG: NOT DETECTED

## 2017-08-11 MED ORDER — FLUCONAZOLE 150 MG PO TABS: 150.0000 mg | ORAL_TABLET | Freq: Once | ORAL | 0 refills | Status: DC

## 2017-08-11 NOTE — Progress Notes (Signed)
Diflucan to pharmacy  

## 2017-08-11 NOTE — Telephone Encounter (Signed)
-----   Message from Arnetha Courser, MD sent at 08/10/2017  6:27 PM EST ----- Guerry Minors, please let the patient know that her complete blood count looks very good overall; thank you

## 2017-08-11 NOTE — Telephone Encounter (Signed)
Called pt no answer. LM for pt to call back for lab results and answering matching picked up and I cannot be sure who will hear message. CRM created. Lab results routed to [facility] Nurse triage.

## 2017-08-23 ENCOUNTER — Encounter: Payer: Self-pay | Admitting: General Surgery

## 2017-08-23 DIAGNOSIS — R002 Palpitations: Secondary | ICD-10-CM | POA: Insufficient documentation

## 2017-08-31 ENCOUNTER — Ambulatory Visit
Admission: RE | Admit: 2017-08-31 | Discharge: 2017-08-31 | Disposition: A | Discharge status: Home / Self Care | Payer: Medicare Other | Source: Ambulatory Visit | Attending: Family Medicine | Admitting: Family Medicine

## 2017-08-31 DIAGNOSIS — N644 Mastodynia: Secondary | ICD-10-CM | POA: Diagnosis not present

## 2017-09-14 ENCOUNTER — Ambulatory Visit: Payer: Medicare Other | Admitting: Family Medicine

## 2017-10-10 ENCOUNTER — Telehealth: Payer: Self-pay | Admitting: Family Medicine

## 2017-10-10 ENCOUNTER — Other Ambulatory Visit: Payer: Self-pay | Admitting: Family Medicine

## 2017-10-10 MED ORDER — FLUCONAZOLE 150 MG PO TABS: 150.0000 mg | ORAL_TABLET | Freq: Once | ORAL | 0 refills | Status: AC

## 2017-10-10 NOTE — Telephone Encounter (Signed)
Copied from [place] [phone]. Topic: Quick Communication - See Telephone Encounter >> Oct 10, 2017 11:07 AM Aurelio Brash B wrote: CRM for notification. See Telephone encounter for:  PT would like a call from Dr Sanda Klein or Dr Delight Ovens nurse  concerning a 2nd test,  she doesn't know what the test is for,  she states she was to have a kidney test but wasn't sure what the second one was. 10/10/17.

## 2017-10-10 NOTE — Telephone Encounter (Signed)
Left detailed voicemail

## 2017-10-10 NOTE — Telephone Encounter (Signed)
Called pt back, she states that her yeast infection did not clear up and would like another RX for diflucan.

## 2017-10-10 NOTE — Telephone Encounter (Signed)
See note from 08/10/17; she was due to repeat a urine one month later Please order urine dip POCT; if negative, then run a urine microalbumin:creatinine on that urine POCT Dx: protein in urine I'll send in the diflucan; another visit if this doesn't resolve it Her next Medicare wellness visit should have been marked by me as one year, not one month; she is due for her next Medicare visit on or after August 11, 2018

## 2017-10-13 ENCOUNTER — Other Ambulatory Visit: Payer: Self-pay | Admitting: Family Medicine

## 2017-10-13 ENCOUNTER — Other Ambulatory Visit (INDEPENDENT_AMBULATORY_CARE_PROVIDER_SITE_OTHER): Payer: Medicare Other

## 2017-10-13 DIAGNOSIS — R809 Proteinuria, unspecified: Secondary | ICD-10-CM

## 2017-10-13 DIAGNOSIS — R82998 Other abnormal findings in urine: Secondary | ICD-10-CM

## 2017-10-13 LAB — POCT URINALYSIS DIPSTICK
BILIRUBIN UA: NEGATIVE
Blood, UA: NEGATIVE
Glucose, UA: NEGATIVE
Ketones, UA: NEGATIVE
NITRITE UA: NEGATIVE
Protein, UA: NEGATIVE
SPEC GRAV UA: 1.015 (ref 1.010–1.025)
UROBILINOGEN UA: 0.2 U/dL
pH, UA: 5 (ref 5.0–8.0)

## 2017-10-13 MED ORDER — SULFAMETHOXAZOLE-TRIMETHOPRIM 800-160 MG PO TABS: 1.0000 | ORAL_TABLET | Freq: Two times a day (BID) | ORAL | 0 refills | Status: DC

## 2017-10-13 NOTE — Progress Notes (Signed)
Antibiotics; culture pending

## 2017-10-14 LAB — URINE CULTURE
MICRO NUMBER:: 90174095
Result:: NO GROWTH
SPECIMEN QUALITY:: ADEQUATE

## 2017-10-14 LAB — MICROALBUMIN / CREATININE URINE RATIO
Creatinine, Urine: 184 mg/dL (ref 20–275)
MICROALB UR: 0.9 mg/dL
Microalb Creat Ratio: 5 mcg/mg creat (ref <30)

## 2017-10-16 ENCOUNTER — Encounter: Payer: Self-pay | Admitting: Family Medicine

## 2017-10-16 DIAGNOSIS — R899 Unspecified abnormal finding in specimens from other organs, systems and tissues: Secondary | ICD-10-CM | POA: Insufficient documentation

## 2017-10-17 ENCOUNTER — Ambulatory Visit: Payer: Medicare Other | Admitting: Family Medicine

## 2017-10-23 ENCOUNTER — Other Ambulatory Visit: Payer: Self-pay | Admitting: Family Medicine

## 2017-10-23 NOTE — Progress Notes (Signed)
Completed ABX; removed from list Added diagnosis codes for labs: E78.2, E04.9, I49.9

## 2017-10-27 ENCOUNTER — Ambulatory Visit: Payer: Medicare Other | Admitting: Family Medicine

## 2017-11-06 ENCOUNTER — Other Ambulatory Visit: Payer: Self-pay | Admitting: Family Medicine

## 2017-11-13 ENCOUNTER — Encounter: Payer: Self-pay | Admitting: Family Medicine

## 2017-11-13 ENCOUNTER — Ambulatory Visit (INDEPENDENT_AMBULATORY_CARE_PROVIDER_SITE_OTHER): Payer: Medicare Other | Admitting: Family Medicine

## 2017-11-13 VITALS — BP 162/78 | HR 98 | Temp 98.4°F | Resp 16 | Wt 197.9 lb

## 2017-11-13 DIAGNOSIS — L509 Urticaria, unspecified: Secondary | ICD-10-CM

## 2017-11-13 DIAGNOSIS — I10 Essential (primary) hypertension: Secondary | ICD-10-CM

## 2017-11-13 DIAGNOSIS — J180 Bronchopneumonia, unspecified organism: Secondary | ICD-10-CM

## 2017-11-13 DIAGNOSIS — Z5181 Encounter for therapeutic drug level monitoring: Secondary | ICD-10-CM | POA: Insufficient documentation

## 2017-11-13 MED ORDER — PREDNISONE 20 MG PO TABS: 20.0000 mg | ORAL_TABLET | Freq: Every day | ORAL | 0 refills | Status: DC

## 2017-11-13 MED ORDER — DOXYCYCLINE HYCLATE 100 MG PO TABS: 100.0000 mg | ORAL_TABLET | Freq: Two times a day (BID) | ORAL | 0 refills | Status: DC

## 2017-11-13 MED ORDER — BENZONATATE 100 MG PO CAPS: 100.0000 mg | ORAL_CAPSULE | Freq: Three times a day (TID) | ORAL | 0 refills | Status: DC | PRN

## 2017-11-13 MED ORDER — TRIAMTERENE-HCTZ 37.5-25 MG PO TABS
0.5000 | ORAL_TABLET | Freq: Every day | ORAL | 3 refills | Status: DC
Start: 2017-11-13 — End: 2019-05-10

## 2017-11-13 NOTE — Assessment & Plan Note (Signed)
Not controlled; add triamterene-hctz, half strength; return in 10-12 days for recheck BP with CMA and BMP

## 2017-11-13 NOTE — Patient Instructions (Addendum)
Start the prednisone Start the cough medicine (if needed) Start the antibiotic Please do eat yogurt or kimchi or take a probiotic daily for the next month We want to replace the healthy germs in the gut If you notice foul, watery diarrhea in the next two months, schedule an appointment RIGHT AWAY or go to an urgent care or the emergency room if a holiday or over a weekend Start the new blood pressure medicine If your pressure drops too low, stop it immediately and call Return in 10-12 days for BP recheck and lab work Try to use PLAIN allergy medicine without the decongestant for hives Avoid: phenylephrine, phenylpropanolamine, and pseudoephredine

## 2017-11-13 NOTE — Assessment & Plan Note (Signed)
Check BMP in 10-12 days with addition of thiazide combo

## 2017-11-13 NOTE — Progress Notes (Signed)
BP (!) 162/78   Pulse 98   Temp 98.4 F (36.9 C) (Oral)   Resp 16   Wt 197 lb 14.4 oz (89.8 kg)   SpO2 95%   BMI 33.44 kg/m   Today's Vitals   11/13/17 1146 11/13/17 1151  BP: (!) 160/82 (!) 162/78  Pulse: 98   Resp: 16   Temp: 98.4 F (36.9 C)   TempSrc: Oral   SpO2: 95%   Weight: 197 lb 14.4 oz (89.8 kg)     Subjective:    Patient ID: Sonya Harper, female    DOB: 1946-08-01, 72 y.o.   MRN: [id number]  HPI: Sonya Harper is a 72 y.o. female  Chief Complaint  Patient presents with  . URI    onset 2weeks symptoms include cough, runny nose, congestion and headache  . Urticaria    started yesterday on stomach, chest and back    HPI Patient is here for an acute visit for two problems  Upper respiratory infection Cough, runny nose, congestion, headache Cough is really bad; productive; sometimes green; wheezing some; she has a touch of asthma she says No sore throat; ears are okay; sinus drainage; no fevers, but didn't check temperature, but did have some chills; daughter had the flu, doing better; no horrible body aches  Urticaria; she gets hives and bumps; has had allergic reactions before; she did see allergist, mold and pencillin are the culprits; lots of wet weather lately; she itches; red bumps on arms and stomach; outside in the damp; she has not taken benadryl or claritin  HTN; higher today than usual; recheck was still 160s; she is taking coreg  Depression screen [facility] 2/9 11/13/2017 08/10/2017 08/10/2017 10/13/2016 07/07/2016  Decreased Interest 0 0 0 0 0  Down, Depressed, Hopeless 1 0 0 0 0  PHQ - 2 Score 1 0 0 0 0    Relevant past medical, surgical, family and social history reviewed Past Medical History:  Diagnosis Date  . Allergy   . Arthritis    feet  . Asthma   . Breast neoplasm   . Chronic lower back pain    s/p fall injury  . Depression   . Dysrhythmia   . Fibromyalgia   . Hypertension   . Hypothyroidism   . Sciatica of right side     . Wears dentures    partial upper and lower   Past Surgical History:  Procedure Laterality Date  . ABDOMINAL HYSTERECTOMY    . BREAST SURGERY    . COLONOSCOPY WITH PROPOFOL N/A 05/13/2016   Procedure: COLONOSCOPY WITH PROPOFOL;  Surgeon: Lucilla Lame, MD;  Location: [place];  Service: Endoscopy;  Laterality: N/A;  . POLYPECTOMY N/A 05/13/2016   Procedure: POLYPECTOMY;  Surgeon: Lucilla Lame, MD;  Location: [place];  Service: Endoscopy;  Laterality: N/A;   Family History  Problem Relation Age of Onset  . Stroke Mother   . Breast cancer Sister 68       breast ca x3  . Colon cancer Paternal Aunt   . Cancer Sister    Social History   Tobacco Use  . Smoking status: Never Smoker  . Smokeless tobacco: Never Used  Substance Use Topics  . Alcohol use: No    Alcohol/week: 0.0 oz  . Drug use: No    Interim medical history since last visit reviewed. Allergies and medications reviewed  Review of Systems Per HPI unless specifically indicated above     Objective:  BP (!) 162/78   Pulse 98   Temp 98.4 F (36.9 C) (Oral)   Resp 16   Wt 197 lb 14.4 oz (89.8 kg)   SpO2 95%   BMI 33.44 kg/m   Wt Readings from Last 3 Encounters:  11/13/17 197 lb 14.4 oz (89.8 kg)  08/10/17 199 lb 4.8 oz (90.4 kg)  10/13/16 199 lb 8 oz (90.5 kg)    Physical Exam  Constitutional: She appears well-developed and well-nourished.  HENT:  Right Ear: No drainage. Tympanic membrane is not erythematous. No middle ear effusion.  Left Ear: No drainage. Tympanic membrane is not erythematous.  No middle ear effusion.  Nose: Rhinorrhea present. No mucosal edema.  Mouth/Throat: Oropharynx is clear and moist and mucous membranes are normal. No posterior oropharyngeal edema or posterior oropharyngeal erythema.  Eyes: EOM are normal. No scleral icterus.  Cardiovascular: Normal rate and regular rhythm.  Pulmonary/Chest: Effort normal. She has decreased breath sounds in the right lower  field. She has no wheezes. She has no rhonchi.  Lymphadenopathy:    She has cervical adenopathy (shoddy).  Skin: Rash (several erythematous lesions on the arms and trunk; no vesicles) noted. She is not diaphoretic. No pallor.  Psychiatric: She has a normal mood and affect. Her mood appears not anxious. She does not exhibit a depressed mood.       Assessment & Plan:   Problem List Items Addressed This Visit      Cardiovascular and Mediastinum   Benign essential HTN    Not controlled; add triamterene-hctz, half strength; return in 10-12 days for recheck BP with CMA and BMP      Relevant Medications   triamterene-hydrochlorothiazide (MAXZIDE-25) 37.5-25 MG tablet   Other Relevant Orders   Basic Metabolic Panel (BMET)     Other   Medication monitoring encounter    Check BMP in 10-12 days with addition of thiazide combo      Relevant Orders   Basic Metabolic Panel (BMET)    Other Visit Diagnoses    Bronchopneumonia    -  Primary   suspicious for RLL bronchopneumonia; hx of pneumonia; will start antibiotics; explained risk of C diff; bypass CXR, minimize lifetime radiation exposure   Relevant Medications   benzonatate (TESSALON PERLES) 100 MG capsule   doxycycline (VIBRA-TABS) 100 MG tablet   Hives       five day course of prednisone; plain antihistamine without decongestants, see AVS       Follow up plan: Return in about 10 days (around 11/23/2017) for follow-up visit with Dr. Sanda Klein.  An after-visit summary was printed and given to the patient at [facility].  Please see the patient instructions which may contain other information and recommendations beyond what is mentioned above in the assessment and plan.  Meds ordered this encounter  Medications  . predniSONE (DELTASONE) 20 MG tablet    Sig: Take 1 tablet (20 mg total) by mouth daily with breakfast.    Dispense:  10 tablet    Refill:  0  . benzonatate (TESSALON PERLES) 100 MG capsule    Sig: Take 1 capsule (100 mg  total) by mouth every 8 (eight) hours as needed for cough.    Dispense:  30 capsule    Refill:  0  . doxycycline (VIBRA-TABS) 100 MG tablet    Sig: Take 1 tablet (100 mg total) by mouth 2 (two) times daily.    Dispense:  14 tablet    Refill:  0  . triamterene-hydrochlorothiazide (MAXZIDE-25) 37.5-25  MG tablet    Sig: Take 0.5 tablets by mouth daily.    Dispense:  45 tablet    Refill:  3    ** Don't fill the whole pill  Rx for capsule; we're just doing half of a tablet **    Orders Placed This Encounter  Procedures  . Basic Metabolic Panel (BMET)

## 2017-11-28 ENCOUNTER — Telehealth: Payer: Self-pay

## 2017-11-28 DIAGNOSIS — L509 Urticaria, unspecified: Secondary | ICD-10-CM

## 2017-11-28 MED ORDER — EPINEPHRINE 0.3 MG/0.3ML IJ SOAJ: INTRAMUSCULAR | 0 refills | Status: DC

## 2017-11-28 MED ORDER — RANITIDINE HCL 150 MG PO TABS: 150.0000 mg | ORAL_TABLET | Freq: Two times a day (BID) | ORAL | 0 refills | Status: DC

## 2017-11-28 MED ORDER — FEXOFENADINE HCL 180 MG PO TABS: 180.0000 mg | ORAL_TABLET | Freq: Every day | ORAL | 0 refills | Status: DC

## 2017-11-28 NOTE — Telephone Encounter (Signed)
I sent the Rx for Epi-pen Let her know to only use that for life-threatening allergic reaction Please refer to dermatologist She can try zantac 150 mg BID instead of 75 mg BID, and also allegra 180 mg daily (I sent both as prescriptions, but they should be available OTC; whichever is less expensive is fine)

## 2017-11-28 NOTE — Telephone Encounter (Signed)
Copied from [place]. Topic: Quick Communication - See Telephone Encounter >> Nov 28, 2017  9:08 AM Antonieta Iba C wrote: CRM for notification. See Telephone encounter for: 11/28/17.  Pt called in because she was seen the other day and prescribed prednisone. Pt says that Rx is not helping. Pt says that she also still have hives. Pt would like to be advised on what she should do next? ALSO, pt says that her previous provider prescribed her an Epi Pen. Pt would also like to have one sent to pharmacy if possible.   CB: [phone]   Pharmacy: [facility], [place] [phone] (Phone) [phone] (Fax)    Please see below and advise. Thanks

## 2017-11-29 NOTE — Telephone Encounter (Signed)
Called pt no answer. LM for pt informing her of the information below. Referral placed for dermatology. CRM created.

## 2017-12-03 ENCOUNTER — Encounter: Payer: Self-pay | Admitting: *Deleted

## 2017-12-03 ENCOUNTER — Emergency Department
Admission: EM | Admit: 2017-12-03 | Discharge: 2017-12-03 | Disposition: A | Discharge status: Home / Self Care | Payer: Medicare Other | Attending: Emergency Medicine | Admitting: Emergency Medicine

## 2017-12-03 ENCOUNTER — Other Ambulatory Visit: Payer: Self-pay

## 2017-12-03 ENCOUNTER — Emergency Department: Payer: Medicare Other

## 2017-12-03 DIAGNOSIS — W57XXXA Bitten or stung by nonvenomous insect and other nonvenomous arthropods, initial encounter: Secondary | ICD-10-CM | POA: Insufficient documentation

## 2017-12-03 DIAGNOSIS — Z79899 Other long term (current) drug therapy: Secondary | ICD-10-CM | POA: Diagnosis not present

## 2017-12-03 DIAGNOSIS — J4530 Mild persistent asthma, uncomplicated: Secondary | ICD-10-CM | POA: Insufficient documentation

## 2017-12-03 DIAGNOSIS — E039 Hypothyroidism, unspecified: Secondary | ICD-10-CM | POA: Insufficient documentation

## 2017-12-03 DIAGNOSIS — I10 Essential (primary) hypertension: Secondary | ICD-10-CM | POA: Diagnosis not present

## 2017-12-03 DIAGNOSIS — L309 Dermatitis, unspecified: Secondary | ICD-10-CM | POA: Insufficient documentation

## 2017-12-03 DIAGNOSIS — J208 Acute bronchitis due to other specified organisms: Secondary | ICD-10-CM | POA: Diagnosis not present

## 2017-12-03 DIAGNOSIS — R05 Cough: Secondary | ICD-10-CM | POA: Diagnosis present

## 2017-12-03 LAB — BASIC METABOLIC PANEL
Anion gap: 6 (ref 5–15)
BUN: 13 mg/dL (ref 6–20)
CALCIUM: 8.7 mg/dL — AB (ref 8.9–10.3)
CO2: 24 mmol/L (ref 22–32)
Chloride: 111 mmol/L (ref 101–111)
Creatinine, Ser: 1.01 mg/dL — ABNORMAL HIGH (ref 0.44–1.00)
GFR calc Af Amer: >60 mL/min (ref >60)
GFR, EST NON AFRICAN AMERICAN: 55 mL/min — AB (ref >60)
GLUCOSE: 107 mg/dL — AB (ref 65–99)
Potassium: 4 mmol/L (ref 3.5–5.1)
Sodium: 141 mmol/L (ref 135–145)

## 2017-12-03 LAB — CBC
HEMATOCRIT: 38.9 % (ref 35.0–47.0)
Hemoglobin: 12.8 g/dL (ref 12.0–16.0)
MCH: 28.2 pg (ref 26.0–34.0)
MCHC: 32.9 g/dL (ref 32.0–36.0)
MCV: 85.5 fL (ref 80.0–100.0)
PLATELETS: 212 10*3/uL (ref 150–440)
RBC: 4.55 MIL/uL (ref 3.80–5.20)
RDW: 15.6 % — ABNORMAL HIGH (ref 11.5–14.5)
WBC: 6.5 10*3/uL (ref 3.6–11.0)

## 2017-12-03 LAB — TROPONIN I: Troponin I: <0.03 ng/mL (ref <0.03)

## 2017-12-03 MED ORDER — PREDNISONE 20 MG PO TABS
40.0000 mg | ORAL_TABLET | Freq: Once | ORAL | Status: AC
  Administered 2017-12-03: 40 mg via ORAL
  Filled 2017-12-03: qty 2

## 2017-12-03 MED ORDER — PREDNISONE 20 MG PO TABS: 40.0000 mg | ORAL_TABLET | Freq: Every day | ORAL | 0 refills | Status: DC

## 2017-12-03 MED ORDER — DIPHENHYDRAMINE HCL 25 MG PO CAPS
25.0000 mg | ORAL_CAPSULE | Freq: Once | ORAL | Status: AC
  Administered 2017-12-03: 25 mg via ORAL

## 2017-12-03 MED ORDER — DIPHENHYDRAMINE HCL 25 MG PO CAPS
ORAL_CAPSULE | ORAL | Status: AC
  Filled 2017-12-03: qty 1

## 2017-12-03 NOTE — ED Triage Notes (Addendum)
Pt to Ed reporting a rash that started on her right leg last year and has spread all over body. Pt reports the rash had been getting better and then worse and now has spread up her back. Rash is red and raised hives that have white heads to them. Pt reports they have been itchy. Pt denies having had fleas in house or other insects that she knows of. PT also reports a cough that she was taking prednisone for that has improved but not subsided completely. No fevers no increased WOB noted at this time.   Pt reports feeling SOB but also denies wanting an EKG or chest xray. PT reports, "I don not think it is my heart" Pt requesting blood work at this time though and reporting increased weakness over the past week.

## 2017-12-03 NOTE — ED Provider Notes (Signed)
[facility] Emergency Department Provider Note   ____________________________________________   First MD Initiated Contact with Patient 12/03/17 1430     (approximate)  I have reviewed the triage vital signs and the nursing notes.   HISTORY  Chief Complaint Rash and Cough    HPI Sonya Harper is a 72 y.o. female history of thyroid disease, allergies, previous breast cancer.  Patient reports that for about a year to year and a half she has been expressing issue rash over her lower legs.  She was given a prescription for steroid cream which seemed to have helped some, but has not seen the rash resolved.  However, she is also noticed some small areas that she describes as pimples or what might be bite marks over her lower legs chest and back which have popped up over the last 3 weeks as well.  They come and go, are itchy in nature, and sometimes she will see them in a little bit of a liner row.  In addition, patient also reports she has had about 2 weeks of cough.  It is generally dry and nonproductive.  She is been using her inhalers and also was on a couple of days of low-dose of steroid that her doctor prescribed her as well as an antibiotic about 2 weeks ago.  She reports overall it seems to be better but she still coughing somewhat.  She has been prescribed [employer], and finds that they are very helpful with regard to the cough.  Denies shortness of breath.  No nausea or vomiting.  No chest pain.  No fevers or chills.    Past Medical History:  Diagnosis Date  . Allergy   . Arthritis    feet  . Asthma   . Breast neoplasm   . Chronic lower back pain    s/p fall injury  . Depression   . Dysrhythmia   . Fibromyalgia   . Hypertension   . Hypothyroidism   . Sciatica of right side   . Wears dentures    partial upper and lower    Patient Active Problem List   Diagnosis Date Noted  . Medication monitoring encounter 11/13/2017  . Abnormal  laboratory test 10/16/2017  . Preventative health care 08/10/2017  . Breast pain in female 08/10/2017  . Obesity (BMI 30.0-34.9) 08/10/2017  . Protein in urine 08/10/2017  . Vaginal atrophy 08/10/2017  . Benign essential HTN 11/16/2016  . Hyperlipidemia, mixed 11/16/2016  . SOBOE (shortness of breath on exertion) 11/16/2016  . Stable angina pectoris ([place]) 11/16/2016  . Dysrhythmia 07/07/2016  . Right low back pain 07/07/2016  . Fall on or from stairs or steps 07/07/2016  . Family history of malignant neoplasm of gastrointestinal tract   . Benign neoplasm of ascending colon   . Benign neoplasm of transverse colon   . Benign neoplasm of descending colon   . Insect bite 03/03/2015  . Goiter 03/03/2015  . Asthma, mild persistent 03/03/2015  . Gastroesophageal reflux disease without esophagitis 03/03/2015    Past Surgical History:  Procedure Laterality Date  . ABDOMINAL HYSTERECTOMY    . BREAST SURGERY    . COLONOSCOPY WITH PROPOFOL N/A 05/13/2016   Procedure: COLONOSCOPY WITH PROPOFOL;  Surgeon: Lucilla Lame, MD;  Location: [place];  Service: Endoscopy;  Laterality: N/A;  . POLYPECTOMY N/A 05/13/2016   Procedure: POLYPECTOMY;  Surgeon: Lucilla Lame, MD;  Location: [place];  Service: Endoscopy;  Laterality: N/A;    Prior  to Admission medications   Medication Sig Start Date End Date Taking? Authorizing Provider  benzonatate (TESSALON PERLES) 100 MG capsule Take 1 capsule (100 mg total) by mouth every 8 (eight) hours as needed for cough. 11/13/17   Lada, Satira Anis, MD  BREO ELLIPTA 100-25 MCG/INH AEPB INHALE ONE DOSE BY MOUTH DAILY 11/06/17   Lada, Satira Anis, MD  COREG CR 80 MG 24 hr capsule TAKE ONE CAPSULE BY MOUTH DAILY 11/15/16   Roselee Nova, MD  doxycycline (VIBRA-TABS) 100 MG tablet Take 1 tablet (100 mg total) by mouth 2 (two) times daily. 11/13/17   Arnetha Courser, MD  EPINEPHrine (EPIPEN 2-PAK) 0.3 mg/0.3 mL IJ SOAJ injection Use per package directions;  one injection subcutaneously for LIFE-THREATENING emergency, deadly reaction 11/28/17   Lada, Satira Anis, MD  fexofenadine (ALLEGRA ALLERGY) 180 MG tablet Take 1 tablet (180 mg total) by mouth daily. 11/28/17   Arnetha Courser, MD  predniSONE (DELTASONE) 20 MG tablet Take 2 tablets (40 mg total) by mouth daily. 12/03/17   Delman Kitten, MD  ranitidine (ZANTAC) 150 MG tablet Take 1 tablet (150 mg total) by mouth 2 (two) times daily. 11/28/17   Lada, Satira Anis, MD  SYNTHROID 50 MCG tablet TAKE 1 BY MOUTH DAILY. DO NOT SUBSTITUTE 12/04/14   [provider]  triamterene-hydrochlorothiazide (MAXZIDE-25) 37.5-25 MG tablet Take 0.5 tablets by mouth daily. 11/13/17   Lada, Satira Anis, MD  VENTOLIN HFA 108 (90 Base) MCG/ACT inhaler INHALE 2 PUFFS EVERY 6 HOURS AS NEEDED FOR SHORTNESS OF BREATH 06/21/16   Wilhelmina Mcardle, MD    Allergies Molds & smuts and Penicillins  Family History  Problem Relation Age of Onset  . Stroke Mother   . Breast cancer Sister 17       breast ca x3  . Colon cancer Paternal Aunt   . Cancer Sister     Social History Social History   Tobacco Use  . Smoking status: Never Smoker  . Smokeless tobacco: Never Used  Substance Use Topics  . Alcohol use: No    Alcohol/week: 0.0 oz  . Drug use: No    Review of Systems Constitutional: No fever/chills Eyes: No visual changes. ENT: No sore throat. Cardiovascular: Denies chest pain. Respiratory: Denies shortness of breath.  The HPI Gastrointestinal: No abdominal pain.  No nausea, no vomiting.  No diarrhea.  No constipation. Genitourinary: Negative for dysuria. Musculoskeletal: Negative for back pain. Skin: Negative for rash except as noted in HPI. Neurological: Negative for headaches, focal weakness or numbness.    ____________________________________________   PHYSICAL EXAM:  VITAL SIGNS: ED Triage Vitals [12/03/17 1306]  Enc Vitals Group     BP (!) 166/81     Pulse Rate 68     Resp 16     Temp 97.9 F  (36.6 C)     Temp Source Oral     SpO2 99 %     Weight 197 lb (89.4 kg)     Height      Head Circumference      Peak Flow      Pain Score 0     Pain Loc      Pain Edu?      Excl. in [place]?     Constitutional: Alert and oriented. Well appearing and in no acute distress. Eyes: Conjunctivae are normal. Head: Atraumatic. Nose: No congestion/rhinnorhea. Mouth/Throat: Mucous membranes are moist. Neck: No stridor.   Cardiovascular: Normal rate, regular rhythm. Grossly normal heart  sounds.  Good peripheral circulation. Respiratory: Normal respiratory effort.  No retractions. Lungs CTAB. Gastrointestinal: Soft and nontender. No distention. Musculoskeletal: No lower extremity tenderness nor edema.  Patient has 2 patches over her lower legs that are about the size of the palm.  Over the anterior shins that demonstrate some slightly excoriated lesions and dry flaky skin.  There is no surrounding erythema or edema. Neurologic:  Normal speech and language. No gross focal neurologic deficits are appreciated.  Skin:  Skin is warm, dry and intact. No rash noted except a few areas that seem to be spread randomly throughout her trunk as well as over the lower legs were itching and excoriation marks are noted, and also she has some very small appear to be pustules often dotted in's straight lines are in close proximity.  They do not match a dermatome, there are bilateral, they are not surrounded by any superinfection or erythema. Psychiatric: Mood and affect are normal. Speech and behavior are normal.  ____________________________________________   LABS (all labs ordered are listed, but only abnormal results are displayed)  Labs Reviewed  BASIC METABOLIC PANEL - Abnormal; Notable for the following components:      Result Value   Glucose, Bld 107 (*)    Creatinine, Ser 1.01 (*)    Calcium 8.7 (*)    GFR calc non Af Amer 55 (*)    All other components within normal limits  CBC - Abnormal; Notable for  the following components:   RDW 15.6 (*)    All other components within normal limits  TROPONIN I   ____________________________________________  EKG   ____________________________________________  RADIOLOGY  Chest x-ray reviewed, no acute ____________________________________________   PROCEDURES  Procedure(s) performed: None  Procedures  Critical Care performed: No  ____________________________________________   INITIAL IMPRESSION / ASSESSMENT AND PLAN / ED COURSE  Pertinent labs & imaging results that were available during my care of the patient were reviewed by me and considered in my medical decision making (see chart for details).  1: Skin lesions.  Patient has small areas that appear like small pustules, they seem to walk and lines without tunneling.  She reports are very itchy.  They do not appear superinfected.  I suspect these may be some type of a small bite mark something like a bedbugs, fleas for example.  Do not see any superinfection I do not see any indication for antibiotic therapy.  In addition she has in her lower shins what appears to be nummular eczema or some type of eczema with dry itchy lesion that does not appear to be superinfected.  She reports she still has some type of steroid cream that was previously prescribed and she can utilize this.  I also recommended that she evaluate her home closely to see if there could be any chance of something like a flea or bedbugs.  Does not appear to be scabies.  Discussed signs and symptoms of superinfection when to return when she was to develop a infection such as cellulitis around the areas.  To: Cough.  Sounds to be a cough is been ongoing for about 2-3 weeks time, nonproductive.  Normal oxygen saturation and her lungs are clear without any difficulty work of breathing.  She does use inhalers occasionally at home which she reports helped relieve her discomfort.  She did not wish to have a chest x-ray initially, but  I did discuss for that if she had a pneumonia at this point after treatment would want to consider  a different antibiotic therapy and consider putting her back on an antibiotic.  Patient agreeable to receiving a chest x-ray.  If chest x-ray is negative suspect likely some type of viral bronchitis which may have occurred.  She does not carry a history of COPD and I do not believe antibiotic would be indicated unless pneumonia was present.  Denies any associated cardiac symptoms.  No history congestive heart failure.  Normal oxygen saturation with normal work of breathing.  I will however place her on prednisone she reports this has relieved symptoms like this in the past, she may very well have bronchitis.  In addition prednisone may help with some of the itching she is been expensing of her rash, in addition to Benadryl which she reports she has been taking over-the-counter in the evenings.  Patient is amenable to following up with both her primary doctor regarding her cough, and also regarding her rash.  She also reports she can set up follow-up with dermatology which I advised if her rash does not improve within the next couple days.    Return precautions and treatment recommendations and follow-up discussed with the patient who is agreeable with the plan.   ____________________________________________   FINAL CLINICAL IMPRESSION(S) / ED DIAGNOSES  Final diagnoses:  Eczema, unspecified type  Insect bite, initial encounter  Viral bronchitis      NEW MEDICATIONS STARTED DURING THIS VISIT:  New Prescriptions   PREDNISONE (DELTASONE) 20 MG TABLET    Take 2 tablets (40 mg total) by mouth daily.     Note:  This document was prepared using Dragon voice recognition software and may include unintentional dictation errors.     Delman Kitten, MD 12/03/17 [phone]

## 2017-12-19 ENCOUNTER — Emergency Department
Admission: EM | Admit: 2017-12-19 | Discharge: 2017-12-19 | Disposition: A | Discharge status: Home / Self Care | Payer: Medicare Other | Attending: Emergency Medicine | Admitting: Emergency Medicine

## 2017-12-19 DIAGNOSIS — I10 Essential (primary) hypertension: Secondary | ICD-10-CM | POA: Insufficient documentation

## 2017-12-19 DIAGNOSIS — Z79899 Other long term (current) drug therapy: Secondary | ICD-10-CM | POA: Insufficient documentation

## 2017-12-19 DIAGNOSIS — T783XXA Angioneurotic edema, initial encounter: Secondary | ICD-10-CM

## 2017-12-19 DIAGNOSIS — J45909 Unspecified asthma, uncomplicated: Secondary | ICD-10-CM | POA: Insufficient documentation

## 2017-12-19 DIAGNOSIS — E039 Hypothyroidism, unspecified: Secondary | ICD-10-CM | POA: Diagnosis not present

## 2017-12-19 DIAGNOSIS — R220 Localized swelling, mass and lump, head: Secondary | ICD-10-CM | POA: Diagnosis not present

## 2017-12-19 LAB — BASIC METABOLIC PANEL
Anion gap: 5 (ref 5–15)
BUN: 18 mg/dL (ref 6–20)
CALCIUM: 8.4 mg/dL — AB (ref 8.9–10.3)
CO2: 24 mmol/L (ref 22–32)
Chloride: 105 mmol/L (ref 101–111)
Creatinine, Ser: 1.09 mg/dL — ABNORMAL HIGH (ref 0.44–1.00)
GFR, EST AFRICAN AMERICAN: 58 mL/min — AB (ref >60)
GFR, EST NON AFRICAN AMERICAN: 50 mL/min — AB (ref >60)
Glucose, Bld: 121 mg/dL — ABNORMAL HIGH (ref 65–99)
Potassium: 3.8 mmol/L (ref 3.5–5.1)
Sodium: 134 mmol/L — ABNORMAL LOW (ref 135–145)

## 2017-12-19 LAB — CBC WITH DIFFERENTIAL/PLATELET
BASOS ABS: 0.1 10*3/uL (ref 0–0.1)
BASOS PCT: 1 %
EOS PCT: 1 %
Eosinophils Absolute: 0.1 10*3/uL (ref 0–0.7)
HCT: 38.9 % (ref 35.0–47.0)
Hemoglobin: 13.5 g/dL (ref 12.0–16.0)
Lymphocytes Relative: 31 %
Lymphs Abs: 2.4 10*3/uL (ref 1.0–3.6)
MCH: 29.6 pg (ref 26.0–34.0)
MCHC: 34.6 g/dL (ref 32.0–36.0)
MCV: 85.6 fL (ref 80.0–100.0)
MONO ABS: 0.4 10*3/uL (ref 0.2–0.9)
Monocytes Relative: 5 %
Neutro Abs: 4.8 10*3/uL (ref 1.4–6.5)
Neutrophils Relative %: 62 %
PLATELETS: 191 10*3/uL (ref 150–440)
RBC: 4.54 MIL/uL (ref 3.80–5.20)
RDW: 16.4 % — AB (ref 11.5–14.5)
WBC: 7.8 10*3/uL (ref 3.6–11.0)

## 2017-12-19 MED ORDER — EPINEPHRINE 0.3 MG/0.3ML IJ SOAJ
INTRAMUSCULAR | Status: AC
  Filled 2017-12-19: qty 0.3

## 2017-12-19 MED ORDER — METHYLPREDNISOLONE SODIUM SUCC 125 MG IJ SOLR
125.0000 mg | Freq: Once | INTRAMUSCULAR | Status: AC
  Administered 2017-12-19: 125 mg via INTRAVENOUS

## 2017-12-19 MED ORDER — PREDNISONE 20 MG PO TABS: 60.0000 mg | ORAL_TABLET | Freq: Every day | ORAL | 0 refills | Status: DC

## 2017-12-19 MED ORDER — FAMOTIDINE IN NACL 20-0.9 MG/50ML-% IV SOLN
INTRAVENOUS | Status: AC
  Administered 2017-12-19: 20 mg via INTRAVENOUS
  Filled 2017-12-19: qty 50

## 2017-12-19 MED ORDER — METHYLPREDNISOLONE SODIUM SUCC 125 MG IJ SOLR
INTRAMUSCULAR | Status: AC
  Administered 2017-12-19: 125 mg via INTRAVENOUS
  Filled 2017-12-19: qty 2

## 2017-12-19 MED ORDER — DEXAMETHASONE SODIUM PHOSPHATE 10 MG/ML IJ SOLN
10.0000 mg | Freq: Once | INTRAMUSCULAR | Status: AC
Start: 2017-12-19 — End: 2017-12-19
  Administered 2017-12-19: 10 mg via INTRAVENOUS
  Filled 2017-12-19: qty 1

## 2017-12-19 MED ORDER — FAMOTIDINE IN NACL 20-0.9 MG/50ML-% IV SOLN
20.0000 mg | Freq: Once | INTRAVENOUS | Status: AC
  Administered 2017-12-19: 20 mg via INTRAVENOUS

## 2017-12-19 MED ORDER — SODIUM CHLORIDE 0.9 % IV BOLUS
1000.0000 mL | Freq: Once | INTRAVENOUS | Status: AC
  Administered 2017-12-19: 1000 mL via INTRAVENOUS

## 2017-12-19 MED ORDER — DIPHENHYDRAMINE HCL 50 MG/ML IJ SOLN
25.0000 mg | Freq: Once | INTRAMUSCULAR | Status: AC
  Administered 2017-12-19: 25 mg via INTRAVENOUS
  Filled 2017-12-19: qty 1

## 2017-12-19 MED ORDER — DIPHENHYDRAMINE HCL 50 MG/ML IJ SOLN
INTRAMUSCULAR | Status: AC
  Administered 2017-12-19: 25 mg via INTRAVENOUS
  Filled 2017-12-19: qty 1

## 2017-12-19 MED ORDER — DIPHENHYDRAMINE HCL 50 MG/ML IJ SOLN
25.0000 mg | Freq: Once | INTRAMUSCULAR | Status: AC
  Administered 2017-12-19: 25 mg via INTRAVENOUS

## 2017-12-19 MED ORDER — DIPHENHYDRAMINE HCL 25 MG PO TABS: 50.0000 mg | ORAL_TABLET | ORAL | 0 refills | Status: DC

## 2017-12-19 NOTE — ED Notes (Addendum)
Dr. Ladene Artist at bedside. Pt appears in no resp distress at this time.  Does report that tongue is about the same as when she came in despite meds. Pt denies difficulty swallowing at this time.

## 2017-12-19 NOTE — ED Notes (Signed)
Dr. Ladene Artist at bedside.

## 2017-12-19 NOTE — ED Triage Notes (Signed)
Patient reports her sister gave her an Epi-pen injection and 25 mg of benadryl immediately prior to arrival to this ED.

## 2017-12-19 NOTE — Consult Note (Signed)
Sonya Harper, [place] [id number] 02-16-1946 No att. providers found  Reason for Consult: Angioedema  HPI: The patient is a 72 year old African-American female who presented to the emergency room this morning with angioedema.  She had some exposure to mold last night before going to bed which she normally does not get.  She knows she is allergic to mold.  She woke this morning at 5 AM with tongue swelling and feeling little short of breath.  She has had asthma in the past as well.  She took an EpiPen and 25 mg of Benadryl and came to the emergency room.  She has received further Benadryl, Zantac, and Solu-Medrol.  She had swelling first on the left side of her tongue but is extended down to the right side but is not seem to go further.  Patient denies taking any ACE inhibitors or ARB's.  She has not had any other new foods or medicines.  She has had periodic flareups more recently of asthma and facial swelling.  She had allergy testing many years ago with no she is allergic to mold and tries to avoid it.  She is on Allegra daily.  Allergies:  Allergies  Allergen Reactions  . Molds & Smuts Anaphylaxis  . Penicillins Nausea And Vomiting and Rash         ROS: Review of systems normal other than 12 systems except per HPI.  PMH:  Past Medical History:  Diagnosis Date  . Allergy   . Arthritis    feet  . Asthma   . Breast neoplasm   . Chronic lower back pain    s/p fall injury  . Depression   . Dysrhythmia   . Fibromyalgia   . Hypertension   . Hypothyroidism   . Sciatica of right side   . Wears dentures    partial upper and lower    FH:  Family History  Problem Relation Age of Onset  . Stroke Mother   . Breast cancer Sister 80       breast ca x3  . Colon cancer Paternal Aunt   . Cancer Sister     SH:  Social History   Socioeconomic History  . Marital status: Married    Spouse name: Not on file  . Number of children: Not on file  . Years of education: Not on file  . Highest  education level: Not on file  Occupational History  . Not on file  Social Needs  . Financial resource strain: Not on file  . Food insecurity:    Worry: Not on file    Inability: Not on file  . Transportation needs:    Medical: Not on file    Non-medical: Not on file  Tobacco Use  . Smoking status: Never Smoker  . Smokeless tobacco: Never Used  Substance and Sexual Activity  . Alcohol use: No    Alcohol/week: 0.0 oz  . Drug use: No  . Sexual activity: Not Currently    Birth control/protection: Post-menopausal  Lifestyle  . Physical activity:    Days per week: Not on file    Minutes per session: Not on file  . Stress: Not on file  Relationships  . Social connections:    Talks on phone: Not on file    Gets together: Not on file    Attends religious service: Not on file    Active member of club or organization: Not on file    Attends meetings of clubs or organizations: Not on file  Relationship status: Not on file  . Intimate partner violence:    Fear of current or ex partner: Not on file    Emotionally abused: Not on file    Physically abused: Not on file    Forced sexual activity: Not on file  Other Topics Concern  . Not on file  Social History Narrative  . Not on file    PSH:  Past Surgical History:  Procedure Laterality Date  . ABDOMINAL HYSTERECTOMY    . BREAST SURGERY    . COLONOSCOPY WITH PROPOFOL N/A 05/13/2016   Procedure: COLONOSCOPY WITH PROPOFOL;  Surgeon: Lucilla Lame, MD;  Location: [place];  Service: Endoscopy;  Laterality: N/A;  . POLYPECTOMY N/A 05/13/2016   Procedure: POLYPECTOMY;  Surgeon: Lucilla Lame, MD;  Location: [place];  Service: Endoscopy;  Laterality: N/A;    Physical  Exam: She is a well-developed and well-nourished African-American female in no acute distress.  CN 2-12 grossly intact and symmetric.  Oral cavity shows her tongue did have some watery edema but is not tense.  She can vocalize fairly well and move her  tongue around.  The posterior tongue is not swollen you can push down and see the posterior pharynx and into the palate.  The uvula is not swollen at all.. Skin warm and dry. Nasal cavity without polyps or purulence. External nose and ears without masses or lesions. Neck supple with no masses or lesions. No lymphadenopathy palpated.  Flexible laryngoscopy was done and dictated in detail elsewhere.  Shows no evidence of swelling at the tongue base or in the hypopharynx or larynx.   A/P: Patient has angioedema from unknown source but likely partly initiated by exposure to molds last night.  She is on Allegra but needs further allergy testing and counseling regarding angioedema and how best to control this.  She has had worsening allergies recently and has not had any specific treatment other than an antihistamine daily.  She is on medications for asthma and needs to remain on those.  If we can desensitize the and inhalant allergies we have a better chance to decrease the amount of reactivity she has overall.  We will also educate her about oral allergies and certain foods that may be problematic as well.  She will remain in the emergency room over the next couple hours to be watched and I think her tongue should start settling down.  If it does not or should swell any further than she will need to be observed in the hospital overnight.  She does not have no swelling currently to recommend intubation as it does not involve anywhere of the hypopharynx or larynx at this time and the tongue is not tense.  She will plan to come the office when she is discharged to have further testing.   Elon Alas Masiel [name] 12/19/2017 8:06 AM

## 2017-12-19 NOTE — ED Provider Notes (Signed)
[facility] Emergency Department Provider Note   ____________________________________________   First MD Initiated Contact with Patient 12/19/17 [phone]     (approximate)  I have reviewed the triage vital signs and the nursing notes.   HISTORY  Chief Complaint Allergic Reaction    HPI Sonya Harper is a 72 y.o. female who presents to the ED from home with a chief complaint of angioedema.  Patient has had a history of angioedema, known allergy to mold who was downstairs in her basement last night before bedtime.  She awoke prior to arrival with left-sided tongue swelling, throat tightness and shortness of breath.  Her sister gave her an injection of EpiPen and patient took 25 mg oral Benadryl prior to arrival.  States her last reaction was several years ago, worse than the current reaction because it involved her entire face; however, she was not hospitalized at that time.  Patient denies taking ACE inhibitors or ARB's.  Denies new foods, medicines or exposures.  Denies chest pain, abdominal pain, nausea, vomiting, diarrhea.  Denies recent travel or trauma.   Past Medical History:  Diagnosis Date  . Allergy   . Arthritis    feet  . Asthma   . Breast neoplasm   . Chronic lower back pain    s/p fall injury  . Depression   . Dysrhythmia   . Fibromyalgia   . Hypertension   . Hypothyroidism   . Sciatica of right side   . Wears dentures    partial upper and lower    Patient Active Problem List   Diagnosis Date Noted  . Medication monitoring encounter 11/13/2017  . Abnormal laboratory test 10/16/2017  . Preventative health care 08/10/2017  . Breast pain in female 08/10/2017  . Obesity (BMI 30.0-34.9) 08/10/2017  . Protein in urine 08/10/2017  . Vaginal atrophy 08/10/2017  . Benign essential HTN 11/16/2016  . Hyperlipidemia, mixed 11/16/2016  . SOBOE (shortness of breath on exertion) 11/16/2016  . Stable angina pectoris ([place]) 11/16/2016  .  Dysrhythmia 07/07/2016  . Right low back pain 07/07/2016  . Fall on or from stairs or steps 07/07/2016  . Family history of malignant neoplasm of gastrointestinal tract   . Benign neoplasm of ascending colon   . Benign neoplasm of transverse colon   . Benign neoplasm of descending colon   . Insect bite 03/03/2015  . Goiter 03/03/2015  . Asthma, mild persistent 03/03/2015  . Gastroesophageal reflux disease without esophagitis 03/03/2015    Past Surgical History:  Procedure Laterality Date  . ABDOMINAL HYSTERECTOMY    . BREAST SURGERY    . COLONOSCOPY WITH PROPOFOL N/A 05/13/2016   Procedure: COLONOSCOPY WITH PROPOFOL;  Surgeon: Lucilla Lame, MD;  Location: [facility];  Service: Endoscopy;  Laterality: N/A;  . POLYPECTOMY N/A 05/13/2016   Procedure: POLYPECTOMY;  Surgeon: Lucilla Lame, MD;  Location: [place];  Service: Endoscopy;  Laterality: N/A;    Prior to Admission medications   Medication Sig Start Date End Date Taking? Authorizing Provider  benzonatate (TESSALON PERLES) 100 MG capsule Take 1 capsule (100 mg total) by mouth every 8 (eight) hours as needed for cough. 11/13/17   Lada, Satira Anis, MD  BREO ELLIPTA 100-25 MCG/INH AEPB INHALE ONE DOSE BY MOUTH DAILY 11/06/17   Lada, Satira Anis, MD  COREG CR 80 MG 24 hr capsule TAKE ONE CAPSULE BY MOUTH DAILY 11/15/16   Roselee Nova, MD  doxycycline (VIBRA-TABS) 100 MG tablet Take 1 tablet (  100 mg total) by mouth 2 (two) times daily. 11/13/17   Arnetha Courser, MD  EPINEPHrine (EPIPEN 2-PAK) 0.3 mg/0.3 mL IJ SOAJ injection Use per package directions; one injection subcutaneously for LIFE-THREATENING emergency, deadly reaction 11/28/17   Lada, Satira Anis, MD  fexofenadine (ALLEGRA ALLERGY) 180 MG tablet Take 1 tablet (180 mg total) by mouth daily. 11/28/17   Arnetha Courser, MD  predniSONE (DELTASONE) 20 MG tablet Take 2 tablets (40 mg total) by mouth daily. 12/03/17   Delman Kitten, MD  ranitidine (ZANTAC) 150 MG tablet Take 1  tablet (150 mg total) by mouth 2 (two) times daily. 11/28/17   Lada, Satira Anis, MD  SYNTHROID 50 MCG tablet TAKE 1 BY MOUTH DAILY. DO NOT SUBSTITUTE 12/04/14   [provider]  triamterene-hydrochlorothiazide (MAXZIDE-25) 37.5-25 MG tablet Take 0.5 tablets by mouth daily. 11/13/17   Lada, Satira Anis, MD  VENTOLIN HFA 108 (90 Base) MCG/ACT inhaler INHALE 2 PUFFS EVERY 6 HOURS AS NEEDED FOR SHORTNESS OF BREATH 06/21/16   Wilhelmina Mcardle, MD    Allergies Molds & smuts and Penicillins  Family History  Problem Relation Age of Onset  . Stroke Mother   . Breast cancer Sister 56       breast ca x3  . Colon cancer Paternal Aunt   . Cancer Sister     Social History Social History   Tobacco Use  . Smoking status: Never Smoker  . Smokeless tobacco: Never Used  Substance Use Topics  . Alcohol use: No    Alcohol/week: 0.0 oz  . Drug use: No    Review of Systems  Constitutional: No fever/chills. Eyes: No visual changes. ENT: Positive for left-sided tongue swelling.  No sore throat. Cardiovascular: Denies chest pain. Respiratory: Denies shortness of breath. Gastrointestinal: No abdominal pain.  No nausea, no vomiting.  No diarrhea.  No constipation. Genitourinary: Negative for dysuria. Musculoskeletal: Negative for back pain. Skin: Negative for rash. Neurological: Negative for headaches, focal weakness or numbness.   ____________________________________________   PHYSICAL EXAM:  VITAL SIGNS: ED Triage Vitals [12/19/17 0555]  Enc Vitals Group     BP      Pulse      Resp      Temp      Temp src      SpO2      Weight 200 lb (90.7 kg)     Height      Head Circumference      Peak Flow      Pain Score      Pain Loc      Pain Edu?      Excl. in [place]?     Constitutional: Alert and oriented. Well appearing and in mild acute distress. Eyes: Conjunctivae are normal. PERRL. EOMI. Head: Atraumatic. Nose: No congestion/rhinnorhea. Mouth/Throat: Slightly muffled voice  secondary to tongue swelling.  Left-sided tongue swelling.  Able to visualize posterior oropharynx.  There is no drooling.  Patient is maintaining secretions well.   Neck: No stridor.  Soft submental space. No palpable masses. Cardiovascular: Normal rate, regular rhythm. Grossly normal heart sounds.  Good peripheral circulation. Respiratory: Normal respiratory effort.  No retractions. Lungs CTAB. Gastrointestinal: Soft and nontender. No distention. No abdominal bruits. No CVA tenderness. Musculoskeletal: No lower extremity tenderness nor edema.  No joint effusions. Neurologic:  Normal speech and language. No gross focal neurologic deficits are appreciated. No gait instability. Skin:  Skin is warm, dry and intact. No rash noted.  No urticaria. Psychiatric: Mood  and affect are normal. Speech and behavior are normal.  ____________________________________________   LABS (all labs ordered are listed, but only abnormal results are displayed)  Labs Reviewed  CBC WITH DIFFERENTIAL/PLATELET - Abnormal; Notable for the following components:      Result Value   RDW 16.4 (*)    All other components within normal limits  BASIC METABOLIC PANEL - Abnormal; Notable for the following components:   Sodium 134 (*)    Glucose, Bld 121 (*)    Creatinine, Ser 1.09 (*)    Calcium 8.4 (*)    GFR calc non Af Amer 50 (*)    GFR calc Af Amer 58 (*)    All other components within normal limits   ____________________________________________  EKG  ED ECG REPORT I, Breckin Savannah J, the attending physician, personally viewed and interpreted this ECG.   Date: 12/19/2017  EKG Time: 0717  Rate: 64  Rhythm: normal EKG, normal sinus rhythm  Axis: Normal  Intervals:none  ST&T Change: Nonspecific  ____________________________________________  RADIOLOGY  ED MD interpretation: None  Official radiology report(s): No results found.  ____________________________________________   PROCEDURES  Procedure(s)  performed: None  Procedures  Critical Care performed: Yes, see critical care note(s)   CRITICAL CARE Performed by: Paulette Blanch   Total critical care time: 30 minutes  Critical care time was exclusive of separately billable procedures and treating other patients.  Critical care was necessary to treat or prevent imminent or life-threatening deterioration.  Critical care was time spent personally by me on the following activities: development of treatment plan with patient and/or surrogate as well as nursing, discussions with consultants, evaluation of patient's response to treatment, examination of patient, obtaining history from patient or surrogate, ordering and performing treatments and interventions, ordering and review of laboratory studies, ordering and review of radiographic studies, pulse oximetry and re-evaluation of patient's condition.  ____________________________________________   INITIAL IMPRESSION / ASSESSMENT AND PLAN / ED COURSE  As part of my medical decision making, I reviewed the following data within the [place] History obtained from family, Nursing notes reviewed and incorporated, Labs reviewed, EKG interpreted, Old chart reviewed and Notes from prior ED visits   72 year old female who presents with angioedema, left-sided tongue swelling.  Differential diagnosis includes but is not limited to allergic reaction resulting angioedema, infectious etiology, etc.  Patient is hemodynamically stable with room air saturations 98%.  She is tolerating secretions well.  Had EpiPen and 25 mg oral Benadryl prior to arrival.  Will initiate IV fluid resuscitation, 125 mg IV Solu-Medrol, Pepcid, additional Benadryl.  Will monitor for a minimum of 4 hours.  Clinical Course as of Dec 20 738  Tue Dec 19, 2017  0641 Room air saturations 96%.  Reexamined tongue.  Swelling now seems to have extended towards the right side as well.  Patient still able to tolerate her  secretions.  Will discuss with ENT to evaluate patient in the emergency department.   [JS]  [id number] Spoke with Dr. Kathyrn Sheriff who will evaluate patient in the emergency department. Recommends additional IV Benadryl.   [JS]  [id number] Updated patient of laboratory results.  Care transferred to Dr. Mariea Clonts.  Patient pending ENT evaluation and period of observation in the emergency department.  Anticipate hospitalization if there is no significant decrease in angioedema.   [JS]    Clinical Course User Index [JS] Paulette Blanch, MD     ____________________________________________   FINAL CLINICAL IMPRESSION(S) / ED DIAGNOSES  Final diagnoses:  Angioedema,  initial encounter     ED Discharge Orders    None       Note:  This document was prepared using Dragon voice recognition software and may include unintentional dictation errors.    Paulette Blanch, MD 12/19/17 [phone]

## 2017-12-19 NOTE — Discharge Instructions (Addendum)
Please take Benadryl every 4 hours for swelling until your swelling has completely resolved.  Then you can take Benadryl as needed for swelling.  Take the entire course of steroids, prednisone, even if you are feeling better.  If you develop new swelling, return immediately to the emergency department.  Return if you develop shortness of breath, drooling, difficulty swallowing, or any other symptoms concerning to you.

## 2017-12-19 NOTE — ED Triage Notes (Signed)
Patient awoke this AM with tongue and oral swelling. Patient reports throat tightness and SOB. Patient reports she was in the basement yesterday - reports allergy to mold. Patient reports similar reaction in the past.

## 2017-12-19 NOTE — Op Note (Signed)
12/19/2017  8:03 AM    Tera Mater  [id number]   Pre-Op Dx: Angioedema  Post-op Dx: Angioedema involving the tongue and some of the upper palate but not the uvula or hypopharynx or larynx  Proc: Flexible laryngoscopy  Surg:  Elon Alas Saragrace Selke  Anes:  GOT  EBL: None  Comp: None  Findings: Angioedema involving the anterior tongue, but not involving any of the tongue base or hypopharynx or larynx  Procedure: The patient was seen at the bedside in the emergency room.  A flexible scope was lubricated and was placed through her right nostril to visualize the hypopharynx and larynx.  The septum was straight and it could slide through the nose easily into the nasopharynx.  There is no swelling the posterior pharyngeal wall but there is slight edema of the upper palate.  The uvula was not swollen at all.  The posterior pharyngeal wall going down the hypopharynx was not swollen nor was the tongue base.  The lingual tonsils were not swollen.  The lateral walls were not swollen.  The epiglottis was normal and the aryepiglottic folds were not swollen either.  The vocal cords were pearly white and moved well.  Dispo:   To be followed in the emergency room for couple more hours to make sure that her swelling subsides  Plan: There is edema only of the anterior tongue and minimal of the upper palate.  There is still good tongue mobility and you could see the posterior pharyngeal wall without difficulty by pushing down the base of tongue.  She does not need intubation currently and can be watched in the emergency room to make sure that swelling continues to subside.  Elon Alas Emelia Sandoval  12/19/2017 8:03 AM

## 2017-12-19 NOTE — ED Provider Notes (Addendum)
This patient was signed out to me by Dr. Lurline Hare.  72 year old female with angioedema, which originally started on the left side of her face and then progressed to be symmetric.  The patient has been seen by Dr. Annamaria Boots girl, who has scoped her and did not see any evidence of laryngeal involvement.  She does have some anterior swelling without any evidence of posterior palate, uvular or posterior pharynx swelling.  At this time, the patient continues to be mildly hypertensive but otherwise hemodynamically stable, she is able to speak in full sentences and has no evidence of hoarse voice, stridor or drooling, shortness of breath or hypoxia.  At this time, we will plan a 2-4-hour observation.  In the emergency department.  If she improves, she will go home with a Medrol Dosepak and strict instructions for Benadryl use with close ENT follow-up.  If her swelling progresses, she will be admitted to the hospital for continued evaluation and treatment.  The patient understands and is in agreement with this plan.  ----------------------------------------- 10:04 AM on 12/19/2017 -----------------------------------------  The patient has continued to remain hemodynamically stable in the emergency department.  I have reevaluated her at this time, and she has decreased tongue swelling and is feeling much better.  I have palpated it under the mandible and she continues to have a soft submandibular space.  She continues to be free of any red flag warnings including hoarse voice, drooling, stridor or desaturation.  At this time, the patient will be discharged home with strict instructions to continue Benadryl treatment until swelling has completely resolved and to continue 5-day course of steroids starting tomorrow.  She will follow-up with the ENT group.  Return precautions were discussed   Eula Listen, MD 12/19/17 7425    Eula Listen, MD 12/19/17 1005

## 2017-12-25 ENCOUNTER — Other Ambulatory Visit: Payer: Self-pay | Admitting: Family Medicine

## 2018-01-12 DIAGNOSIS — R21 Rash and other nonspecific skin eruption: Secondary | ICD-10-CM | POA: Diagnosis not present

## 2018-01-25 ENCOUNTER — Ambulatory Visit: Payer: Medicare Other | Admitting: Family Medicine

## 2018-01-29 ENCOUNTER — Other Ambulatory Visit: Payer: Self-pay | Admitting: Pulmonary Disease

## 2018-02-05 DIAGNOSIS — R21 Rash and other nonspecific skin eruption: Secondary | ICD-10-CM | POA: Diagnosis not present

## 2018-02-05 DIAGNOSIS — W57XXXA Bitten or stung by nonvenomous insect and other nonvenomous arthropods, initial encounter: Secondary | ICD-10-CM | POA: Diagnosis not present

## 2018-02-26 ENCOUNTER — Ambulatory Visit (INDEPENDENT_AMBULATORY_CARE_PROVIDER_SITE_OTHER): Payer: Medicare Other | Admitting: Family Medicine

## 2018-02-26 ENCOUNTER — Encounter

## 2018-02-26 ENCOUNTER — Encounter: Payer: Self-pay | Admitting: Family Medicine

## 2018-02-26 VITALS — BP 132/84 | HR 76 | Temp 97.8°F | Resp 16 | Ht 65.0 in | Wt 191.1 lb

## 2018-02-26 DIAGNOSIS — R739 Hyperglycemia, unspecified: Secondary | ICD-10-CM

## 2018-02-26 DIAGNOSIS — E049 Nontoxic goiter, unspecified: Secondary | ICD-10-CM

## 2018-02-26 DIAGNOSIS — E669 Obesity, unspecified: Secondary | ICD-10-CM

## 2018-02-26 DIAGNOSIS — I10 Essential (primary) hypertension: Secondary | ICD-10-CM

## 2018-02-26 DIAGNOSIS — J069 Acute upper respiratory infection, unspecified: Secondary | ICD-10-CM | POA: Diagnosis not present

## 2018-02-26 DIAGNOSIS — I208 Other forms of angina pectoris: Secondary | ICD-10-CM | POA: Diagnosis not present

## 2018-02-26 DIAGNOSIS — Z5181 Encounter for therapeutic drug level monitoring: Secondary | ICD-10-CM

## 2018-02-26 DIAGNOSIS — N289 Disorder of kidney and ureter, unspecified: Secondary | ICD-10-CM | POA: Diagnosis not present

## 2018-02-26 DIAGNOSIS — I499 Cardiac arrhythmia, unspecified: Secondary | ICD-10-CM

## 2018-02-26 DIAGNOSIS — E782 Mixed hyperlipidemia: Secondary | ICD-10-CM

## 2018-02-26 DIAGNOSIS — R81 Glycosuria: Secondary | ICD-10-CM

## 2018-02-26 MED ORDER — BENZONATATE 100 MG PO CAPS: 100.0000 mg | ORAL_CAPSULE | Freq: Three times a day (TID) | ORAL | 0 refills | Status: DC | PRN

## 2018-02-26 NOTE — Assessment & Plan Note (Signed)
Seeing Dr. Nehemiah Massed, asked her to contact cardiologist about long-acting nitrate or PRN nitrate

## 2018-02-26 NOTE — Assessment & Plan Note (Signed)
Praise given for weight loss; goal weight over the next year will be 140 pounds

## 2018-02-26 NOTE — Assessment & Plan Note (Signed)
Try DASH guidelines 

## 2018-02-26 NOTE — Assessment & Plan Note (Signed)
Managed by endocrinologist

## 2018-02-26 NOTE — Progress Notes (Addendum)
BP 132/84 (BP Location: Right Arm, Patient Position: Sitting, Cuff Size: Large)   Pulse 76   Temp 97.8 F (36.6 C) (Oral)   Resp 16   Ht 5\' 5"  (1.651 m)   Wt 191 lb 1.6 oz (86.7 kg)   SpO2 94%   BMI 31.80 kg/m    Subjective:    Patient ID: Sonya Harper, female    DOB: Sep 17, 1945, 72 y.o.   MRN: [id number]  HPI: Sonya Harper is a 72 y.o. female  Chief Complaint  Patient presents with  . URI    cough, ear pain, congestion for 10 days  . Follow-up    sugar found in urine    HPI Patient is here for f/u  She takes Coreg; for BP and her heart; stable angina; cardiologist is Dr. Nehemiah Massed; she has chest pain at times; not very often, sometimes a lot; then goes to the bathroom over and over; he told her not to worry; heart beats really fast; frequent urinating when fast heart rate; not checking BP away from the doctor; she'll get a BP cuff  High cholesterol; she's going to try harder to watch her diet; mostly chicken and fish Asthma; has been really good lately  She caught a cold; friend had stomach flu, trying to help him; then patient got sick, bad bad cough, sounded rough when she coughed; no vomiting; no fever; bringing up sputum, little bit green; noticed wheezing more, right side of neck and right ear pain  Goiter; managed by endocrinologist; weight has been going down, and she is happy about that Lab Results  Component Value Date   TSH 1.40 08/10/2017   She went to the hospital in April; she had mold in her basement; seeing dermatologist too; she went on disability because of back and legs; quit going to pain clinic  Fall Risk  02/26/2018 11/13/2017 08/10/2017 10/13/2016 07/07/2016  Falls in the past year? No No No No Yes  Number falls in past yr: - - - - 2 or more  Injury with Fall? - - - - Yes  Risk Factor Category  - - - - High Fall Risk  Risk for fall due to : - - - - History of fall(s)  Follow up - - - - Falls evaluation completed;Falls prevention discussed    Depression screen [facility] 2/9 02/26/2018 11/13/2017 08/10/2017 08/10/2017 10/13/2016  Decreased Interest 0 0 0 0 0  Down, Depressed, Hopeless 0 1 0 0 0  PHQ - 2 Score 0 1 0 0 0  Altered sleeping 0 - - - -  Tired, decreased energy 0 - - - -  Change in appetite 0 - - - -  Feeling bad or failure about yourself  0 - - - -  Trouble concentrating 0 - - - -  Moving slowly or fidgety/restless 0 - - - -  Suicidal thoughts 0 - - - -  PHQ-9 Score 0 - - - -  Difficult doing work/chores Not difficult at all - - - -    Relevant past medical, surgical, family and social history reviewed Past Medical History:  Diagnosis Date  . Allergy   . Arthritis    feet  . Asthma   . Breast neoplasm   . Chronic lower back pain    s/p fall injury  . Depression   . Dysrhythmia   . Fibromyalgia   . Hypertension   . Hypothyroidism   . Sciatica of right side   .  Wears dentures    partial upper and lower   Past Surgical History:  Procedure Laterality Date  . ABDOMINAL HYSTERECTOMY    . BREAST SURGERY    . COLONOSCOPY WITH PROPOFOL N/A 05/13/2016   Procedure: COLONOSCOPY WITH PROPOFOL;  Surgeon: Lucilla Lame, MD;  Location: [facility];  Service: Endoscopy;  Laterality: N/A;  . POLYPECTOMY N/A 05/13/2016   Procedure: POLYPECTOMY;  Surgeon: Lucilla Lame, MD;  Location: [facility];  Service: Endoscopy;  Laterality: N/A;   Family History  Problem Relation Age of Onset  . Stroke Mother   . Breast cancer Sister 67       breast ca x3  . Colon cancer Paternal Aunt   . Cancer Sister    Social History   Tobacco Use  . Smoking status: Never Smoker  . Smokeless tobacco: Never Used  Substance Use Topics  . Alcohol use: No    Alcohol/week: 0.0 oz  . Drug use: No    Interim medical history since last visit reviewed. Allergies and medications reviewed  Review of Systems Per HPI unless specifically indicated above     Objective:    BP 132/84 (BP Location: Right Arm, Patient Position: Sitting,  Cuff Size: Large)   Pulse 76   Temp 97.8 F (36.6 C) (Oral)   Resp 16   Ht 5\' 5"  (1.651 m)   Wt 191 lb 1.6 oz (86.7 kg)   SpO2 94%   BMI 31.80 kg/m   Wt Readings from Last 3 Encounters:  02/26/18 191 lb 1.6 oz (86.7 kg)  12/19/17 200 lb (90.7 kg)  12/03/17 197 lb (89.4 kg)    Physical Exam  Constitutional: She appears well-developed and well-nourished. No distress.  HENT:  Head: Normocephalic and atraumatic.  Right Ear: Tympanic membrane, external ear and ear canal normal. Tympanic membrane is not erythematous. No middle ear effusion.  Left Ear: Tympanic membrane, external ear and ear canal normal. Tympanic membrane is not erythematous.  No middle ear effusion.  Mouth/Throat: Mucous membranes are normal. No posterior oropharyngeal edema.  Eyes: EOM are normal. No scleral icterus.  Neck: No thyromegaly present.  Cardiovascular: Normal rate, regular rhythm and normal heart sounds.  No murmur heard. Pulmonary/Chest: Effort normal and breath sounds normal. No respiratory distress. She has no wheezes.  Abdominal: Soft. Bowel sounds are normal. She exhibits no distension.  Musculoskeletal: Normal range of motion. She exhibits no edema.  Neurological: She is alert. She exhibits normal muscle tone.  Skin: Skin is warm and dry. She is not diaphoretic. No pallor.  Psychiatric: She has a normal mood and affect. Her behavior is normal. Judgment and thought content normal.    Results for orders placed or performed during the hospital encounter of 12/19/17  CBC with Differential  Result Value Ref Range   WBC 7.8 3.6 - 11.0 K/uL   RBC 4.54 3.80 - 5.20 MIL/uL   Hemoglobin 13.5 12.0 - 16.0 g/dL   HCT 38.9 35.0 - 47.0 %   MCV 85.6 80.0 - 100.0 fL   MCH 29.6 26.0 - 34.0 pg   MCHC 34.6 32.0 - 36.0 g/dL   RDW 16.4 (H) 11.5 - 14.5 %   Platelets 191 150 - 440 K/uL   Neutrophils Relative % 62 %   Neutro Abs 4.8 1.4 - 6.5 K/uL   Lymphocytes Relative 31 %   Lymphs Abs 2.4 1.0 - 3.6 K/uL    Monocytes Relative 5 %   Monocytes Absolute 0.4 0.2 - 0.9 K/uL  Eosinophils Relative 1 %   Eosinophils Absolute 0.1 0 - 0.7 K/uL   Basophils Relative 1 %   Basophils Absolute 0.1 0 - 0.1 K/uL  Basic metabolic panel  Result Value Ref Range   Sodium 134 (L) 135 - 145 mmol/L   Potassium 3.8 3.5 - 5.1 mmol/L   Chloride 105 101 - 111 mmol/L   CO2 24 22 - 32 mmol/L   Glucose, Bld 121 (H) 65 - 99 mg/dL   BUN 18 6 - 20 mg/dL   Creatinine, Ser 1.09 (H) 0.44 - 1.00 mg/dL   Calcium 8.4 (L) 8.9 - 10.3 mg/dL   GFR calc non Af Amer 50 (L) >60 mL/min   GFR calc Af Amer 58 (L) >60 mL/min   Anion gap 5 5 - 15      Assessment & Plan:   Problem List Items Addressed This Visit      Cardiovascular and Mediastinum   Stable angina pectoris ([facility]) (Chronic)    Seeing Dr. Nehemiah Massed, asked her to contact cardiologist about long-acting nitrate or PRN nitrate      Dysrhythmia (Chronic)    Continue the Coreg and contact cardiologist      Benign essential HTN - Primary (Chronic)    Try DASH guidelines        Endocrine   Goiter (Chronic)    Managed by endocrinologist        Other   Medication monitoring encounter   Relevant Orders   COMPLETE METABOLIC PANEL WITH GFR   Obesity (BMI 30.0-34.9)    Praise given for weight loss; goal weight over the next year will be 140 pounds      Hyperlipidemia, mixed (Chronic)    Try to limit saturated fats      Relevant Orders   Lipid panel    Other Visit Diagnoses    Glucosuria       Relevant Orders   Hemoglobin A1c   Urinalysis w microscopic + reflex cultur   Viral upper respiratory tract infection       use SABA if needed; suspect this is resolving; no antibiotics indicated at this time; call if worsening   Renal insufficiency       check renal function   Hypocalcemia       check Ca2+, vitamin D, phos   Relevant Orders   VITAMIN D 25 Hydroxy (Vit-D Deficiency, Fractures)   Phosphorus   Hyperglycemia       check A1c       Follow up  plan: Return in about 6 months (around 08/28/2018) for follow-up visit with Dr. Sanda Klein; Medicare Wellness visit when due with Ammie.  An after-visit summary was printed and given to the patient at [place].  Please see the patient instructions which may contain other information and recommendations beyond what is mentioned above in the assessment and plan.  Meds ordered this encounter  Medications  . benzonatate (TESSALON PERLES) 100 MG capsule    Sig: Take 1 capsule (100 mg total) by mouth every 8 (eight) hours as needed for cough.    Dispense:  30 capsule    Refill:  0    Orders Placed This Encounter  Procedures  . Hemoglobin A1c  . COMPLETE METABOLIC PANEL WITH GFR  . VITAMIN D 25 Hydroxy (Vit-D Deficiency, Fractures)  . Phosphorus  . Urinalysis w microscopic + reflex cultur  . Lipid panel

## 2018-02-26 NOTE — Assessment & Plan Note (Signed)
Continue the Coreg and contact cardiologist

## 2018-02-26 NOTE — Assessment & Plan Note (Signed)
Try to limit saturated fats 

## 2018-02-26 NOTE — Patient Instructions (Addendum)
Please call Dr. Nehemiah Massed for your Coreg prescription Also ask him if either long-acting or short-acting nitrate would be appropriate for your chest pain  Try to follow the DASH guidelines (DASH stands for Dietary Approaches to Stop Hypertension). Try to limit the sodium in your diet to no more than 1,500mg  of sodium per day. Certainly try to not exceed 2,000 mg per day at the very most. Do not add salt when cooking or at the table.  Check the sodium amount on labels when shopping, and choose items lower in sodium when given a choice. Avoid or limit foods that already contain a lot of sodium. Eat a diet rich in fruits and vegetables and whole grains, and try to lose weight if overweight or obese  Check out the information at familydoctor.org entitled "Nutrition for Weight Loss: What You Need to Know about Fad Diets" Try to lose between 1-2 pounds per week by taking in fewer calories and burning off more calories You can succeed by limiting portions, limiting foods dense in calories and fat, becoming more active, and drinking 8 glasses of water a day (64 ounces) Don't skip meals, especially breakfast, as skipping meals may alter your metabolism Do not use over-the-counter weight loss pills or gimmicks that claim rapid weight loss A healthy BMI (or body mass index) is between 18.5 and 24.9 You can calculate your ideal BMI at the [place] website ClubMonetize.fr  Preventing Unhealthy Weight Gain, Adult Staying at a healthy weight is important. When fat builds up in your body, you may become overweight or obese. These conditions put you at greater risk for developing certain health problems, such as heart disease, diabetes, sleeping problems, joint problems, and some cancers. Unhealthy weight gain is often the result of making unhealthy choices in what you eat. It is also a result of not getting enough exercise. You can make changes to your lifestyle to  prevent obesity and stay as healthy as possible. What nutrition changes can be made? To maintain a healthy weight and prevent obesity:  Eat only as much as your body needs. To do this: ? Pay attention to signs that you are hungry or full. Stop eating as soon as you feel full. ? If you feel hungry, try drinking water first. Drink enough water so your urine is clear or pale yellow. ? Eat smaller portions. ? Look at serving sizes on food labels. Most foods contain more than one serving per container. ? Eat the recommended amount of calories for your gender and activity level. While most active people should eat around 2,000 calories per day, if you are trying to lose weight or are not very active, you main need to eat less calories. Talk to your health care provider or dietitian about how many calories you should eat each day.  Choose healthy foods, such as: ? Fruits and vegetables. Try to fill at least half of your plate at each meal with fruits and vegetables. ? Whole grains, such as whole wheat bread, brown rice, and quinoa. ? Lean meats, such as chicken or fish. ? Other healthy proteins, such as beans, eggs, or tofu. ? Healthy fats, such as nuts, seeds, fatty fish, and olive oil. ? Low-fat or fat-free dairy.  Check food labels and avoid food and drinks that: ? Are high in calories. ? Have added sugar. ? Are high in sodium. ? Have saturated fats or trans fats.  Limit how much you eat of the following foods: ? Prepackaged meals. ? Fast food. ?  Fried foods. ? Processed meat, such as bacon, sausage, and deli meats. ? Fatty cuts of red meat and poultry with skin.  Cook foods in healthier ways, such as by baking, broiling, or grilling.  When grocery shopping, try to shop around the outside of the store. This helps you buy mostly fresh foods and avoid canned and prepackaged foods.  What lifestyle changes can be made?  Exercise at least 30 minutes 5 or more days each week. Exercising  includes brisk walking, yard work, biking, running, swimming, and team sports like basketball and soccer. Ask your health care provider which exercises are safe for you.  Do not use any products that contain nicotine or tobacco, such as cigarettes and e-cigarettes. If you need help quitting, ask your health care provider.  Limit alcohol intake to no more than 1 drink a day for nonpregnant women and 2 drinks a day for men. One drink equals 12 oz of beer, 5 oz of wine, or 1 oz of hard liquor.  Try to get 7-9 hours of sleep each night. What other changes can be made?  Keep a food and activity journal to keep track of: ? What you ate and how many calories you had. Remember to count sauces, dressings, and side dishes. ? Whether you were active, and what exercises you did. ? Your calorie, weight, and activity goals.  Check your weight regularly. Track any changes. If you notice you have gained weight, make changes to your diet or activity routine.  Avoid taking weight-loss medicines or supplements. Talk to your health care provider before starting any new medicine or supplement.  Talk to your health care provider before trying any new diet or exercise plan. Why are these changes important? Eating healthy, staying active, and having healthy habits not only help prevent obesity, they also:  Help you to manage stress and emotions.  Help you to connect with friends and family.  Improve your self-esteem.  Improve your sleep.  Prevent long-term health problems.  What can happen if changes are not made? Being obese or overweight can cause you to develop joint or bone problems, which can make it hard for you to stay active or do activities you enjoy. Being obese or overweight also puts stress on your heart and lungs and can lead to health problems like diabetes, heart disease, and some cancers. Where to find more information: Talk with your health care provider or a dietitian about healthy  eating and healthy lifestyle choices. You may also find other information through these resources:  U.S. Department of Agriculture MyPlate: FormerBoss.no  American Heart Association: www.heart.org  Centers for Disease Control and Prevention: http://www.wolf.info/  Summary  Staying at a healthy weight is important. It helps prevent certain diseases and health problems, such as heart disease, diabetes, joint problems, sleep disorders, and some cancers.  Being obese or overweight can cause you to develop joint or bone problems, which can make it hard for you to stay active or do activities you enjoy.  You can prevent unhealthy weight gain by eating a healthy diet, exercising regularly, not smoking, limiting alcohol, and getting enough sleep.  Talk with your health care provider or a dietitian for guidance about healthy eating and healthy lifestyle choices. This information is not intended to replace advice given to you by your health care provider. Make sure you discuss any questions you have with your health care provider. Document Released: 08/23/2016 Document Revised: 09/28/2016 Document Reviewed: 09/28/2016 Elsevier Interactive Patient Education  2018  [employer].

## 2018-02-27 ENCOUNTER — Other Ambulatory Visit: Payer: Self-pay | Admitting: Family Medicine

## 2018-02-27 ENCOUNTER — Encounter: Payer: Self-pay | Admitting: Family Medicine

## 2018-02-27 DIAGNOSIS — R7303 Prediabetes: Secondary | ICD-10-CM

## 2018-02-27 HISTORY — DX: Prediabetes: R73.03

## 2018-02-27 LAB — COMPLETE METABOLIC PANEL WITH GFR
AG Ratio: 1.5 (calc) (ref 1.0–2.5)
ALT: 15 U/L (ref 6–29)
AST: 19 U/L (ref 10–35)
Albumin: 3.9 g/dL (ref 3.6–5.1)
Alkaline phosphatase (APISO): 76 U/L (ref 33–130)
BILIRUBIN TOTAL: 0.6 mg/dL (ref 0.2–1.2)
BUN/Creatinine Ratio: 18 (calc) (ref 6–22)
BUN: 20 mg/dL (ref 7–25)
CALCIUM: 9.4 mg/dL (ref 8.6–10.4)
CHLORIDE: 104 mmol/L (ref 98–110)
CO2: 27 mmol/L (ref 20–32)
CREATININE: 1.1 mg/dL — AB (ref 0.60–0.93)
GFR, EST AFRICAN AMERICAN: 59 mL/min/{1.73_m2} — AB (ref >=60)
GFR, EST NON AFRICAN AMERICAN: 51 mL/min/{1.73_m2} — AB (ref >=60)
Globulin: 2.6 g/dL (calc) (ref 1.9–3.7)
Glucose, Bld: 115 mg/dL — ABNORMAL HIGH (ref 65–99)
Potassium: 3.9 mmol/L (ref 3.5–5.3)
Sodium: 138 mmol/L (ref 135–146)
TOTAL PROTEIN: 6.5 g/dL (ref 6.1–8.1)

## 2018-02-27 LAB — LIPID PANEL
CHOL/HDL RATIO: 2.8 (calc) (ref <5.0)
CHOLESTEROL: 148 mg/dL (ref <200)
HDL: 52 mg/dL (ref >50)
LDL Cholesterol (Calc): 80 mg/dL (calc)
Non-HDL Cholesterol (Calc): 96 mg/dL (calc) (ref <130)
TRIGLYCERIDES: 76 mg/dL (ref <150)

## 2018-02-27 LAB — URINALYSIS W MICROSCOPIC + REFLEX CULTURE
BACTERIA UA: NONE SEEN /HPF
BILIRUBIN URINE: NEGATIVE
Glucose, UA: NEGATIVE
Hgb urine dipstick: NEGATIVE
Hyaline Cast: NONE SEEN /LPF
KETONES UR: NEGATIVE
LEUKOCYTE ESTERASE: NEGATIVE
Nitrites, Initial: NEGATIVE
PROTEIN: NEGATIVE
RBC / HPF: NONE SEEN /HPF (ref 0–2)
Specific Gravity, Urine: 1.02 (ref 1.001–1.03)
WBC, UA: NONE SEEN /HPF (ref 0–5)
pH: <=5 (ref 5.0–8.0)

## 2018-02-27 LAB — PHOSPHORUS: Phosphorus: 3.5 mg/dL (ref 2.1–4.3)

## 2018-02-27 LAB — VITAMIN D 25 HYDROXY (VIT D DEFICIENCY, FRACTURES): Vit D, 25-Hydroxy: 17 ng/mL — ABNORMAL LOW (ref 30–100)

## 2018-02-27 LAB — HEMOGLOBIN A1C
EAG (MMOL/L): 7 (calc)
Hgb A1c MFr Bld: 6 % of total Hgb — ABNORMAL HIGH (ref <5.7)
Mean Plasma Glucose: 126 (calc)

## 2018-02-27 LAB — NO CULTURE INDICATED

## 2018-02-27 MED ORDER — ATORVASTATIN CALCIUM 10 MG PO TABS: 10.0000 mg | ORAL_TABLET | Freq: Every day | ORAL | 1 refills | Status: DC

## 2018-02-27 MED ORDER — VITAMIN D (ERGOCALCIFEROL) 1.25 MG (50000 UNIT) PO CAPS: 50000.0000 [IU] | ORAL_CAPSULE | ORAL | 1 refills | Status: AC

## 2018-02-27 NOTE — Progress Notes (Signed)
Rx vitamin D sent

## 2018-03-07 ENCOUNTER — Telehealth: Payer: Self-pay

## 2018-03-07 MED ORDER — PROMETHAZINE-DM 6.25-15 MG/5ML PO SYRP: 5.0000 mL | ORAL_SOLUTION | Freq: Four times a day (QID) | ORAL | 0 refills | Status: DC | PRN

## 2018-03-07 NOTE — Telephone Encounter (Signed)
Rx sent If not getting better in one week, or if she gets worse, she should be seen (either here or urgent care)

## 2018-03-07 NOTE — Telephone Encounter (Signed)
Copied from [place] [id number]. Topic: General - Other >> Mar 06, 2018  2:01 PM Yvette Rack wrote: Reason for CRM: pt states that her cough isn't any better it gets worst at night the tessalon pearls doesn't work she would like a cough medicine she uses the [facility], [place]  Please advise

## 2018-03-07 NOTE — Telephone Encounter (Signed)
Patient was informed via voice mail.

## 2018-03-23 ENCOUNTER — Ambulatory Visit: Payer: Medicare Other

## 2018-03-30 DIAGNOSIS — E042 Nontoxic multinodular goiter: Secondary | ICD-10-CM | POA: Diagnosis not present

## 2018-03-30 DIAGNOSIS — E039 Hypothyroidism, unspecified: Secondary | ICD-10-CM | POA: Diagnosis not present

## 2018-04-06 DIAGNOSIS — E039 Hypothyroidism, unspecified: Secondary | ICD-10-CM | POA: Diagnosis not present

## 2018-04-06 DIAGNOSIS — E042 Nontoxic multinodular goiter: Secondary | ICD-10-CM | POA: Diagnosis not present

## 2018-04-24 ENCOUNTER — Other Ambulatory Visit: Payer: Self-pay | Admitting: Family Medicine

## 2018-04-26 ENCOUNTER — Other Ambulatory Visit: Payer: Self-pay | Admitting: Family Medicine

## 2018-05-15 ENCOUNTER — Other Ambulatory Visit: Payer: Self-pay

## 2018-05-15 NOTE — Telephone Encounter (Signed)
Copied from [place] [phone]. Topic: General - Other >> May 14, 2018  3:44 PM Bea Graff, NT wrote: Reason for CRM: Pt is wanting to know if she should get the shingles vaccine if she didn't have chicken poxs? She would like a call to schedule if she should take vaccine. She also states she needs 2 Epipens sent in to the her pharmacy.

## 2018-05-16 MED ORDER — EPINEPHRINE 0.3 MG/0.3ML IJ SOAJ: INTRAMUSCULAR | 1 refills | Status: DC

## 2018-05-16 NOTE — Telephone Encounter (Signed)
Left detailed voicemail

## 2018-05-16 NOTE — Telephone Encounter (Signed)
It is fine to get Shingrix even if she never remembered having chicken pox, according to the CDC: "You can get Shingrix whether or not you remember having had chickenpox in the past. Studies show that more than 99% of Americans 40 years and older have had chickenpox, even if they don't remember having the disease."  I sent the epipen for her

## 2018-07-27 ENCOUNTER — Ambulatory Visit: Payer: Medicare Other | Admitting: Family Medicine

## 2018-08-06 ENCOUNTER — Telehealth: Payer: Self-pay | Admitting: Family Medicine

## 2018-08-06 NOTE — Telephone Encounter (Signed)
Left message for pt to please call me at [phone] to see about scheduling her AWV with Kasey on 08/30/18 since she is already scheduled to see Lada. Would need to move her appt with Lada to 9:00 and she could see Kasey at 8:00.

## 2018-08-30 ENCOUNTER — Ambulatory Visit (INDEPENDENT_AMBULATORY_CARE_PROVIDER_SITE_OTHER): Payer: Medicare Other | Admitting: Family Medicine

## 2018-08-30 ENCOUNTER — Encounter: Payer: Self-pay | Admitting: Family Medicine

## 2018-08-30 VITALS — BP 128/76 | HR 64 | Temp 97.7°F | Ht 65.0 in | Wt 186.7 lb

## 2018-08-30 DIAGNOSIS — E782 Mixed hyperlipidemia: Secondary | ICD-10-CM | POA: Diagnosis not present

## 2018-08-30 DIAGNOSIS — E049 Nontoxic goiter, unspecified: Secondary | ICD-10-CM | POA: Diagnosis not present

## 2018-08-30 DIAGNOSIS — I10 Essential (primary) hypertension: Secondary | ICD-10-CM | POA: Diagnosis not present

## 2018-08-30 DIAGNOSIS — Z5181 Encounter for therapeutic drug level monitoring: Secondary | ICD-10-CM

## 2018-08-30 DIAGNOSIS — R102 Pelvic and perineal pain: Secondary | ICD-10-CM

## 2018-08-30 DIAGNOSIS — I208 Other forms of angina pectoris: Secondary | ICD-10-CM

## 2018-08-30 DIAGNOSIS — E559 Vitamin D deficiency, unspecified: Secondary | ICD-10-CM | POA: Diagnosis not present

## 2018-08-30 DIAGNOSIS — E669 Obesity, unspecified: Secondary | ICD-10-CM

## 2018-08-30 DIAGNOSIS — R7303 Prediabetes: Secondary | ICD-10-CM | POA: Diagnosis not present

## 2018-08-30 DIAGNOSIS — M79642 Pain in left hand: Secondary | ICD-10-CM

## 2018-08-30 MED ORDER — NITROGLYCERIN 0.4 MG SL SUBL: 0.4000 mg | SUBLINGUAL_TABLET | SUBLINGUAL | 3 refills | Status: DC | PRN

## 2018-08-30 MED ORDER — ASPIRIN EC 81 MG PO TBEC: 81.0000 mg | DELAYED_RELEASE_TABLET | Freq: Every day | ORAL | Status: DC

## 2018-08-30 NOTE — Assessment & Plan Note (Signed)
Check glucose and A1c; proud of her weight loss

## 2018-08-30 NOTE — Assessment & Plan Note (Signed)
Continue medicines; try to limit salt in the food

## 2018-08-30 NOTE — Assessment & Plan Note (Addendum)
Check lipids; she will decrease the statin to every other day just for two weeks to see if that helps with aches and pains; in the meantime, start CoQ-10, then start back to every day to see if that helps statin be toelrated better

## 2018-08-30 NOTE — Assessment & Plan Note (Addendum)
managed by thyroid doctor at [facility]

## 2018-08-30 NOTE — Assessment & Plan Note (Signed)
Check labs today.

## 2018-08-30 NOTE — Progress Notes (Signed)
BP 128/76   Pulse 64   Temp 97.7 F (36.5 C)   Ht 5\' 5"  (1.651 m)   Wt 186 lb 11.2 oz (84.7 kg)   SpO2 99%   BMI 31.07 kg/m    Subjective:    Patient ID: Sonya Harper, female    DOB: Nov 29, 1945, 72 y.o.   MRN: [id number]  HPI: Sonya Harper is a 72 y.o. female  Chief Complaint  Patient presents with  . Follow-up    HPI Patient is here for f/u  She has cataracts and is going to see the eye doctor, not sure if ready for surgery; she is ready; can tell she is having trouble at night  High cholesterol; on atorvastatin; she does have aches in her legs; would like to decrease Lab Results  Component Value Date   CHOL 148 02/26/2018   HDL 52 02/26/2018   LDLCALC 80 02/26/2018   TRIG 76 02/26/2018   CHOLHDL 2.8 02/26/2018    High blood pressure; on medicines; needs refills; room for improvement in terms of salt in food  Hypothyroidism; energy level is pretty good; she is active at home, husband has Parkinson's so she has to do everything; her weight is down, but she has really been of trying; she would like to get down to 140 pounds; weighed 110 pounds when she got married  Lab Results  Component Value Date   TSH 1.40 08/10/2017    Her left middle finger and arm were hurting; she hurt the left hand real bad, she hit it on the table accidentally; she went to the ER; they took an xray and they said it wasn't broken; that was a year ago and has hurt off an on since them  She wants to change her heart doctor; occasional chest pain but nothing like it was; Dr. Nehemiah Massed told her to not take any aspirin  Prediabetes; no one in the family has diabetes Lab Results  Component Value Date   HGBA1C 6.0 (H) 02/26/2018   Stomach has been hurting down low; just a few weeks; thinks it might be a UTI  Low vitamin D  Depression screen [facility] 2/9 08/30/2018 02/26/2018 11/13/2017 08/10/2017 08/10/2017  Decreased Interest 0 0 0 0 0  Down, Depressed, Hopeless 0 0 1 0 0  PHQ - 2 Score  0 0 1 0 0  Altered sleeping 0 0 - - -  Tired, decreased energy 0 0 - - -  Change in appetite 0 0 - - -  Feeling bad or failure about yourself  0 0 - - -  Trouble concentrating 0 0 - - -  Moving slowly or fidgety/restless 0 0 - - -  Suicidal thoughts 0 0 - - -  PHQ-9 Score 0 0 - - -  Difficult doing work/chores Not difficult at all Not difficult at all - - -   Fall Risk  08/30/2018 02/26/2018 11/13/2017 08/10/2017 10/13/2016  Falls in the past year? 0 No No No No  Number falls in past yr: - - - - -  Injury with Fall? - - - - -  Risk Factor Category  - - - - -  Risk for fall due to : - - - - -  Follow up - - - - -    Relevant past medical, surgical, family and social history reviewed Past Medical History:  Diagnosis Date  . Allergy   . Arthritis    feet  . Asthma   .  Breast neoplasm   . Chronic lower back pain    s/p fall injury  . Depression   . Dysrhythmia   . Fibromyalgia   . Hypertension   . Hypothyroidism   . Prediabetes 02/27/2018  . Sciatica of right side   . Wears dentures    partial upper and lower   Past Surgical History:  Procedure Laterality Date  . ABDOMINAL HYSTERECTOMY    . BREAST SURGERY    . COLONOSCOPY WITH PROPOFOL N/A 05/13/2016   Procedure: COLONOSCOPY WITH PROPOFOL;  Surgeon: Lucilla Lame, MD;  Location: [place];  Service: Endoscopy;  Laterality: N/A;  . POLYPECTOMY N/A 05/13/2016   Procedure: POLYPECTOMY;  Surgeon: Lucilla Lame, MD;  Location: [place];  Service: Endoscopy;  Laterality: N/A;   Family History  Problem Relation Age of Onset  . Stroke Mother   . Breast cancer Sister 27       breast ca x3  . Colon cancer Paternal Aunt   . Cancer Sister    Social History   Tobacco Use  . Smoking status: Never Smoker  . Smokeless tobacco: Never Used  Substance Use Topics  . Alcohol use: No    Alcohol/week: 0.0 standard drinks  . Drug use: No     [facility] Visit from 08/30/2018 in [facility]  AUDIT-C Score   0      Interim medical history since last visit reviewed. Allergies and medications reviewed  Review of Systems Per HPI unless specifically indicated above     Objective:    BP 128/76   Pulse 64   Temp 97.7 F (36.5 C)   Ht 5\' 5"  (1.651 m)   Wt 186 lb 11.2 oz (84.7 kg)   SpO2 99%   BMI 31.07 kg/m   Wt Readings from Last 3 Encounters:  08/30/18 186 lb 11.2 oz (84.7 kg)  02/26/18 191 lb 1.6 oz (86.7 kg)  12/19/17 200 lb (90.7 kg)    Physical Exam Constitutional:      General: She is not in acute distress.    Appearance: She is well-developed. She is not diaphoretic.  HENT:     Head: Normocephalic and atraumatic.  Eyes:     General: No scleral icterus. Neck:     Thyroid: No thyromegaly.  Cardiovascular:     Rate and Rhythm: Normal rate and regular rhythm.     Heart sounds: Normal heart sounds. No murmur.  Pulmonary:     Effort: Pulmonary effort is normal. No respiratory distress.     Breath sounds: Normal breath sounds. No wheezing.  Abdominal:     General: Bowel sounds are normal. There is no distension.     Palpations: Abdomen is soft.  Musculoskeletal:       Hands:     Comments: Slight contracture along flexor tendon left middle finger  Skin:    General: Skin is warm and dry.     Coloration: Skin is not pale.  Neurological:     Mental Status: She is alert.     Comments: Grip 5/5  Psychiatric:        Behavior: Behavior normal.        Thought Content: Thought content normal.        Judgment: Judgment normal.     Results for orders placed or performed in visit on 02/26/18  Hemoglobin A1c  Result Value Ref Range   Hgb A1c MFr Bld 6.0 (H) <5.7 % of total Hgb   Mean Plasma Glucose  126 (calc)   eAG (mmol/L) 7.0 (calc)  COMPLETE METABOLIC PANEL WITH GFR  Result Value Ref Range   Glucose, Bld 115 (H) 65 - 99 mg/dL   BUN 20 7 - 25 mg/dL   Creat 1.10 (H) 0.60 - 0.93 mg/dL   GFR, Est Non African American 51 (L) > OR = 60 mL/min/1.28m2   GFR, Est African  American 59 (L) > OR = 60 mL/min/1.22m2   BUN/Creatinine Ratio 18 6 - 22 (calc)   Sodium 138 135 - 146 mmol/L   Potassium 3.9 3.5 - 5.3 mmol/L   Chloride 104 98 - 110 mmol/L   CO2 27 20 - 32 mmol/L   Calcium 9.4 8.6 - 10.4 mg/dL   Total Protein 6.5 6.1 - 8.1 g/dL   Albumin 3.9 3.6 - 5.1 g/dL   Globulin 2.6 1.9 - 3.7 g/dL (calc)   AG Ratio 1.5 1.0 - 2.5 (calc)   Total Bilirubin 0.6 0.2 - 1.2 mg/dL   Alkaline phosphatase (APISO) 76 33 - 130 U/L   AST 19 10 - 35 U/L   ALT 15 6 - 29 U/L  VITAMIN D 25 Hydroxy (Vit-D Deficiency, Fractures)  Result Value Ref Range   Vit D, 25-Hydroxy 17 (L) 30 - 100 ng/mL  Phosphorus  Result Value Ref Range   Phosphorus 3.5 2.1 - 4.3 mg/dL  Lipid panel  Result Value Ref Range   Cholesterol 148 <200 mg/dL   HDL 52 >50 mg/dL   Triglycerides 76 <150 mg/dL   LDL Cholesterol (Calc) 80 mg/dL (calc)   Total CHOL/HDL Ratio 2.8 <5.0 (calc)   Non-HDL Cholesterol (Calc) 96 <130 mg/dL (calc)  Urinalysis w microscopic + reflex cultur  Result Value Ref Range   Color, Urine YELLOW YELLOW   APPearance CLEAR CLEAR   Specific Gravity, Urine 1.020 1.001 - 1.03   pH < OR = 5.0 5.0 - 8.0   Glucose, UA NEGATIVE NEGATIVE   Bilirubin Urine NEGATIVE NEGATIVE   Ketones, ur NEGATIVE NEGATIVE   Hgb urine dipstick NEGATIVE NEGATIVE   Protein, ur NEGATIVE NEGATIVE   Nitrites, Initial NEGATIVE NEGATIVE   Leukocyte Esterase NEGATIVE NEGATIVE   WBC, UA NONE SEEN 0 - 5 /HPF   RBC / HPF NONE SEEN 0 - 2 /HPF   Squamous Epithelial / LPF 0-5 < OR = 5 /HPF   Bacteria, UA NONE SEEN NONE SEEN /HPF   Calcium Oxalate Crystal FEW NONE OR FE /HPF   Hyaline Cast NONE SEEN NONE SEEN /LPF  REFLEXIVE URINE CULTURE  Result Value Ref Range   Reflexve Urine Culture NO CULTURE INDICATED       Assessment & Plan:   Problem List Items Addressed This Visit      Cardiovascular and Mediastinum   Stable angina pectoris (HCC) (Chronic)    Refer to new cardiologist; I'm not sure why she was  told to not take aspirin; she knows to call 911; I am going to advise 81 mg aspirin and PRN NTG to have on-hand      Relevant Medications   aspirin EC 81 MG tablet   nitroGLYCERIN (NITROSTAT) 0.4 MG SL tablet   Other Relevant Orders   Ambulatory referral to Cardiology   Benign essential HTN - Primary (Chronic)    Continue medicines; try to limit salt in the food      Relevant Medications   aspirin EC 81 MG tablet   nitroGLYCERIN (NITROSTAT) 0.4 MG SL tablet     Endocrine   Goiter (Chronic)  managed by thyroid doctor at [facility]        Other   Prediabetes    Check glucose and A1c; proud of her weight loss      Relevant Orders   Hemoglobin A1c   Obesity (BMI 30.0-34.9)    Encouragement given to keep up her efforts at weight loss; see AVS      Medication monitoring encounter    Check labs today      Relevant Orders   COMPLETE METABOLIC PANEL WITH GFR   Hyperlipidemia, mixed (Chronic)    Check lipids; she will decrease the statin to every other day just for two weeks to see if that helps with aches and pains; in the meantime, start CoQ-10, then start back to every day to see if that helps statin be toelrated better      Relevant Medications   aspirin EC 81 MG tablet   nitroGLYCERIN (NITROSTAT) 0.4 MG SL tablet   Other Relevant Orders   Lipid panel    Other Visit Diagnoses    Suprapubic pain       check urine today for infection; urine dip, micro, culture   Relevant Orders   Urinalysis w microscopic + reflex cultur   Vitamin D deficiency       check level, supplement if needed   Relevant Orders   VITAMIN D 25 Hydroxy (Vit-D Deficiency, Fractures)   Left hand pain       chronic; offered referral since going on (and off) for a  year; she declined; she is welcome to call back if she hcnages her mind       Follow up plan: Return in about 6 months (around 03/01/2019) for follow-up visit with Dr. Sanda Klein (or just after).  An after-visit summary was printed and  given to the patient at [place].  Please see the patient instructions which may contain other information and recommendations beyond what is mentioned above in the assessment and plan.  Meds ordered this encounter  Medications  . aspirin EC 81 MG tablet    Sig: Take 1 tablet (81 mg total) by mouth daily.  . nitroGLYCERIN (NITROSTAT) 0.4 MG SL tablet    Sig: Place 1 tablet (0.4 mg total) under the tongue every 5 (five) minutes as needed for chest pain. Max 3 pills per episode    Dispense:  50 tablet    Refill:  3    Orders Placed This Encounter  Procedures  . VITAMIN D 25 Hydroxy (Vit-D Deficiency, Fractures)  . Urinalysis w microscopic + reflex cultur  . Lipid panel  . COMPLETE METABOLIC PANEL WITH GFR  . Hemoglobin A1c  . Ambulatory referral to Cardiology

## 2018-08-30 NOTE — Assessment & Plan Note (Signed)
Encouragement given to keep up her efforts at weight loss; see AVS

## 2018-08-30 NOTE — Assessment & Plan Note (Addendum)
Refer to new cardiologist; I'm not sure why she was told to not take aspirin; she knows to call 911; I am going to advise 81 mg aspirin and PRN NTG to have on-hand

## 2018-08-30 NOTE — Patient Instructions (Addendum)
Try CoQ-10 for the aches that you feel with the statin (cholesterol medicine) Try to take the cholesterol medicine atorvastatin every other day for two weeks to see if that helps, then go back up with the CoQ10 and see if you tolerate that Try to limit saturated fats in your diet (bologna, hot dogs, barbeque, cheeseburgers, hamburgers, steak, bacon, sausage, cheese, etc.) and get more fresh fruits, vegetables, and whole grains We'll have you see the new cardiologist We'll let you know what the labs show If you have not heard anything from my staff in a week about any orders/referrals/studies from today, please contact us here to follow-up [phone]  I am so proud of your weight loss efforts Check out the information at familydoctor.org entitled "Nutrition for Weight Loss: What You Need to Know about Fad Diets" Try to lose between 1 pound per week by taking in fewer calories and burning off more calories You can succeed by limiting portions, limiting foods dense in calories and fat, becoming more active, and drinking 8 glasses of water a day (64 ounces) Don't skip meals, especially breakfast, as skipping meals may alter your metabolism Do not use over-the-counter weight loss pills or gimmicks that claim rapid weight loss A healthy BMI (or body mass index) is between 18.5 and 24.9 You can calculate your ideal BMI at the [place] website ClubMonetize.fr  Preventing Health Risks of Being Overweight Maintaining a healthy body weight is an important part of your overall health. Your healthy body weight depends on your age, gender, and height. Being overweight puts you at risk for many health problems, including:  Heart disease.  Diabetes.  Problems sleeping.  Joint problems. You can make changes to your diet and lifestyle to prevent these risks. Consider working with a health care provider or a dietitian to make these changes. What nutrition  changes can be made?   Eat only as much as your body needs. In most cases, this is about 2,000 calories a day, but the amount varies depending on your height, gender, and activity level. Ask your health care provider how many calories you should have each day. Eating more than your body needs on a regular basis can cause you to become overweight or obese.  Eat slowly, and stop eating when you feel full.  Choose healthy foods, including: ? Fruits and vegetables. ? Lean meats. ? Low-fat dairy products. ? High-fiber foods, such as whole grains and beans. ? Healthy snacks like vegetable sticks, a piece of fruit, or a small amount of yogurt or cheese.  Avoid foods and drinks that are high in sugar, salt (sodium), saturated fat, or trans fat. This includes: ? Many desserts such as candy, cookies, and ice cream. ? Soda. ? Fried foods. ? Processed meats such as hot dogs or lunch meats. ? Prepackaged snack foods. What lifestyle changes can be made?   Exercise for at least 150 minutes a week to prevent weight gain, or as often as recommended by your health care provider. Do moderate-intensity exercise, such as brisk walking. ? Spread it out by exercising for 30 minutes 5 days a week, or in short 10-minute bursts several times a day.  Find other ways to stay active and burn calories, such as yard work or a hobby that involves physical activity.  Get at least 8 hours of sleep each night. When you are well-rested, you are more likely to be active and make healthy choices during the day. To sleep better: ? Try to go  to bed and wake up at about the same time every day. ? Keep your bedroom dark, quiet, and cool. ? Make sure that your bed is comfortable. ? Avoid stimulating activities, such as watching television or exercising, for at least one hour before bedtime. Why are these changes important? Eating healthy and being active helps you lose weight and prevent health problems caused by being  overweight. Making these changes can also help you manage stress, feel better mentally, and connect with friends and family. What can happen if changes are not made? Being overweight can affect you for your entire life. You may develop joint or bone problems that make it painful or difficult for you to play sports or do activities you enjoy. Being overweight puts stress on your heart and lungs and can lead to medical problems like diabetes, heart disease, and sleeping problems. Where to find support You can get support for preventing health risks of being overweight from:  Your health care provider or a dietitian. They can provide guidance about healthy eating and healthy lifestyle choices.  Weight loss support groups, online or in-person. Where to find more information  MyPlate: FormerBoss.no ? This an online tool that provides personalized recommendations about foods to eat each day.  The Centers for Disease Control and Prevention: http://sharp-hammond.biz/ ? This resource gives tips for managing weight and having an active lifestyle. Summary  To prevent unhealthy weight gain, it is important to maintain a healthy diet high in vegetables and whole grains, exercise regularly, and get at least 8 hours of sleep each night.  Making these changes helps prevent many long-term (chronic) health conditions that can shorten your life, such as diabetes, heart disease, and stroke. This information is not intended to replace advice given to you by your health care provider. Make sure you discuss any questions you have with your health care provider. Document Released: 07/19/2017 Document Revised: 07/19/2017 Document Reviewed: 07/19/2017 Elsevier Interactive Patient Education  2019 [employer].

## 2018-08-31 LAB — HEMOGLOBIN A1C
Hgb A1c MFr Bld: 5.9 % of total Hgb — ABNORMAL HIGH (ref <5.7)
Mean Plasma Glucose: 123 (calc)
eAG (mmol/L): 6.8 (calc)

## 2018-08-31 LAB — COMPLETE METABOLIC PANEL WITH GFR
AG Ratio: 1.7 (calc) (ref 1.0–2.5)
ALT: 11 U/L (ref 6–29)
AST: 15 U/L (ref 10–35)
Albumin: 3.8 g/dL (ref 3.6–5.1)
Alkaline phosphatase (APISO): 73 U/L (ref 33–130)
BUN/Creatinine Ratio: 14 (calc) (ref 6–22)
BUN: 14 mg/dL (ref 7–25)
CO2: 27 mmol/L (ref 20–32)
Calcium: 8.8 mg/dL (ref 8.6–10.4)
Chloride: 108 mmol/L (ref 98–110)
Creat: 0.98 mg/dL — ABNORMAL HIGH (ref 0.60–0.93)
GFR, Est African American: 67 mL/min/{1.73_m2} (ref >=60)
GFR, Est Non African American: 58 mL/min/{1.73_m2} — ABNORMAL LOW (ref >=60)
GLOBULIN: 2.3 g/dL (ref 1.9–3.7)
Glucose, Bld: 94 mg/dL (ref 65–99)
Potassium: 4.1 mmol/L (ref 3.5–5.3)
Sodium: 141 mmol/L (ref 135–146)
Total Bilirubin: 0.4 mg/dL (ref 0.2–1.2)
Total Protein: 6.1 g/dL (ref 6.1–8.1)

## 2018-08-31 LAB — URINALYSIS W MICROSCOPIC + REFLEX CULTURE
BACTERIA UA: NONE SEEN /HPF
BILIRUBIN URINE: NEGATIVE
Glucose, UA: NEGATIVE
HYALINE CAST: NONE SEEN /LPF
Hgb urine dipstick: NEGATIVE
Ketones, ur: NEGATIVE
Leukocyte Esterase: NEGATIVE
NITRITES URINE, INITIAL: NEGATIVE
PROTEIN: NEGATIVE
RBC / HPF: NONE SEEN /HPF (ref 0–2)
SPECIFIC GRAVITY, URINE: 1.023 (ref 1.001–1.03)
WBC, UA: NONE SEEN /HPF (ref 0–5)
pH: <=5 (ref 5.0–8.0)

## 2018-08-31 LAB — LIPID PANEL
Cholesterol: 134 mg/dL (ref <200)
HDL: 49 mg/dL — ABNORMAL LOW (ref >50)
LDL Cholesterol (Calc): 72 mg/dL (calc)
Non-HDL Cholesterol (Calc): 85 mg/dL (calc) (ref <130)
Total CHOL/HDL Ratio: 2.7 (calc) (ref <5.0)
Triglycerides: 54 mg/dL (ref <150)

## 2018-08-31 LAB — NO CULTURE INDICATED

## 2018-08-31 LAB — VITAMIN D 25 HYDROXY (VIT D DEFICIENCY, FRACTURES): Vit D, 25-Hydroxy: 24 ng/mL — ABNORMAL LOW (ref 30–100)

## 2018-08-31 NOTE — Progress Notes (Signed)
Joelene Millin, please let the patient know that her vitamin D level is still low, but better than before; try to get 2,000 iu of vitamin D3 daily for the next 2 to 3 months, then just take 1,000 iu daily Other than some superficial skin cells, her urine was unremarkable Her HDL cholesterol is just under the ideal range (over 50); try to lose a little bit of weight and walk to get that up a few points Kidney function improved a little bit A1c improved a little bit, so keep up the good work!

## 2018-09-03 ENCOUNTER — Telehealth: Payer: Self-pay | Admitting: Family Medicine

## 2018-09-03 NOTE — Telephone Encounter (Signed)
Reviewed lab results and physician's note with patient. Stated she understood all directions.

## 2018-09-03 NOTE — Telephone Encounter (Signed)
Patient called, left message with person who answered the phone to call back for lab results, he verbalized understanding.

## 2018-09-03 NOTE — Telephone Encounter (Signed)
Copied from [place] [id number]. Topic: Quick Communication - Lab Results (Clinic Use ONLY) >> Aug 31, 2018  1:39 PM Sibyl Parr, CMA wrote: Called patient to inform them of lab results. When patient returns call, triage nurse may disclose results.

## 2018-09-03 NOTE — Telephone Encounter (Signed)
Pt called back to get results.  Copied from [place] [id number]. Topic: Quick Communication - Lab Results (Clinic Use ONLY) >> Aug 31, 2018  1:39 PM Sibyl Parr, CMA wrote: Called patient to inform them of lab results. When patient returns call, triage nurse may disclose results.

## 2018-09-17 ENCOUNTER — Other Ambulatory Visit: Payer: Self-pay | Admitting: Family Medicine

## 2018-09-18 ENCOUNTER — Telehealth: Payer: Self-pay

## 2018-09-18 ENCOUNTER — Other Ambulatory Visit: Payer: Self-pay

## 2018-09-18 DIAGNOSIS — Z1239 Encounter for other screening for malignant neoplasm of breast: Secondary | ICD-10-CM

## 2018-09-18 NOTE — Telephone Encounter (Signed)
Copied from [place] [id number]. Topic: Referral - Request for Referral >> Sep 17, 2018  4:58 PM Alanda Slim E wrote: Has patient seen PCP for this complaint? No *If NO, is insurance requiring patient see PCP for this issue before PCP can refer them? No  Referral for which specialty: Mammogram  Preferred provider/office: [facility]  Reason for referral: Provider wouldn't do it without an order from Dr. Sanda Klein

## 2018-09-18 NOTE — Telephone Encounter (Signed)
Mammo ordered.

## 2018-10-01 ENCOUNTER — Telehealth: Payer: Self-pay | Admitting: Gastroenterology

## 2018-10-15 ENCOUNTER — Telehealth: Payer: Self-pay | Admitting: Family Medicine

## 2018-10-15 NOTE — Telephone Encounter (Signed)
I called the patient to schedule Medicare AWV-I with Nurse Health Advisor, [place], but I was told that she wasn't in at the time.  I'll try again later.  If the patient calls back, please schedule Medicare Wellness Visit with Nurse Health Advisor. VDM (DD)

## 2018-10-24 NOTE — Telephone Encounter (Signed)
error 

## 2018-10-30 ENCOUNTER — Telehealth: Payer: Self-pay | Admitting: Family Medicine

## 2018-10-30 NOTE — Telephone Encounter (Signed)
I called the patient to schedule AWV with [place].  There was no answer, and I wasn't able to leave a message because the voicemail was full. VDM (DD)

## 2018-11-05 ENCOUNTER — Telehealth: Payer: Self-pay

## 2018-11-05 DIAGNOSIS — M47816 Spondylosis without myelopathy or radiculopathy, lumbar region: Secondary | ICD-10-CM

## 2018-11-05 NOTE — Telephone Encounter (Signed)
Copied from [place] [id number]. Topic: General - Other >> Nov 05, 2018  9:06 AM Bea Graff, NT wrote: Reason for CRM: Pt requesting a note from Dr. Sanda Klein to be excused from jury duty due to her back issues.

## 2018-11-06 ENCOUNTER — Encounter: Payer: Self-pay | Admitting: Family Medicine

## 2018-11-06 DIAGNOSIS — M47816 Spondylosis without myelopathy or radiculopathy, lumbar region: Secondary | ICD-10-CM | POA: Insufficient documentation

## 2018-11-06 HISTORY — DX: Spondylosis without myelopathy or radiculopathy, lumbar region: M47.816

## 2018-11-06 NOTE — Assessment & Plan Note (Signed)
Note for jury duty requested by patient

## 2018-11-06 NOTE — Telephone Encounter (Signed)
Imaging: IMPRESSION: No acute or traumatic finding.  Chronic lumbar facet arthropathy.   Electronically Signed   By: Nelson Chimes M.D.   On: 07/12/2016 11:54  -----------------------------------------------  I spoke with patient She cannot sit for a long time Tries to just deal with it Today is one of those days Chronic back pain limits her ability to sit for a long time Asked if we could mail the letter  She did mention some issues with itching down below; reviewed last urine; encouraged her to make an appt here with me or Micheline Maze; she agrees

## 2018-11-21 DIAGNOSIS — S93401A Sprain of unspecified ligament of right ankle, initial encounter: Secondary | ICD-10-CM | POA: Diagnosis not present

## 2018-11-21 DIAGNOSIS — M13871 Other specified arthritis, right ankle and foot: Secondary | ICD-10-CM | POA: Diagnosis not present

## 2018-12-11 ENCOUNTER — Telehealth: Payer: Self-pay

## 2018-12-11 ENCOUNTER — Other Ambulatory Visit: Payer: Self-pay

## 2018-12-11 ENCOUNTER — Ambulatory Visit (INDEPENDENT_AMBULATORY_CARE_PROVIDER_SITE_OTHER): Payer: Medicare Other | Admitting: Nurse Practitioner

## 2018-12-11 ENCOUNTER — Other Ambulatory Visit: Payer: Self-pay | Admitting: Family Medicine

## 2018-12-11 ENCOUNTER — Encounter: Payer: Self-pay | Admitting: Nurse Practitioner

## 2018-12-11 DIAGNOSIS — J302 Other seasonal allergic rhinitis: Secondary | ICD-10-CM

## 2018-12-11 DIAGNOSIS — R05 Cough: Secondary | ICD-10-CM

## 2018-12-11 DIAGNOSIS — E782 Mixed hyperlipidemia: Secondary | ICD-10-CM | POA: Diagnosis not present

## 2018-12-11 DIAGNOSIS — R059 Cough, unspecified: Secondary | ICD-10-CM

## 2018-12-11 DIAGNOSIS — R7303 Prediabetes: Secondary | ICD-10-CM

## 2018-12-11 DIAGNOSIS — I10 Essential (primary) hypertension: Secondary | ICD-10-CM

## 2018-12-11 DIAGNOSIS — E559 Vitamin D deficiency, unspecified: Secondary | ICD-10-CM

## 2018-12-11 MED ORDER — FLUTICASONE PROPIONATE 50 MCG/ACT NA SUSP: 2.0000 | Freq: Every day | NASAL | 6 refills | Status: DC

## 2018-12-11 MED ORDER — PROMETHAZINE-DM 6.25-15 MG/5ML PO SYRP: 5.0000 mL | ORAL_SOLUTION | Freq: Four times a day (QID) | ORAL | 0 refills | Status: DC | PRN

## 2018-12-11 MED ORDER — FEXOFENADINE HCL 180 MG PO TABS: 180.0000 mg | ORAL_TABLET | Freq: Every day | ORAL | 0 refills | Status: DC

## 2018-12-11 NOTE — Progress Notes (Signed)
Virtual Visit via Telephone Note  I connected with@ on 12/11/18 at  1:20 PM EDT by telephone and verified that I am speaking with the correct person using two identifiers.   Staff discussed the limitations, risks, security and privacy concerns of performing an evaluation and management service by telephone and the availability of in person appointments. Staff also discussed with the patient that there may be a patient responsible charge related to this service. The patient expressed understanding and agreed to proceed.  Patients location: home My location: [place] office  Other people in meeting: none    HPI  Patient states annually around the same time gets a tickle in her throat and then a dry cough. States this time it has been going on for about 3-4 weeks. States cough is worse at night. Cough is resolved when she drinks water. Endorses some watery, itchy eyes and nasal congestion. States takes benadryl PRN last time she took it was 2 nights ago.   Also endorses mild vaginal itch, no discharge the last few days. States has had this before and it self-resolves. No recent antibiotic use.   Patient takes cholesterol every other day due to myalgias, no issues with this dose.   Patient states she ran out of her maxide so she stopped taking it, continues to take coreg- states she does not recall how long she has been out.   Denies shortness of breath, fevers, fatigue, chest pain, headache, blurry vision, myalgias.   PHQ2/9: Depression screen [facility] 2/9 12/11/2018 08/30/2018 02/26/2018 11/13/2017 08/10/2017  Decreased Interest 0 0 0 0 0  Down, Depressed, Hopeless 0 0 0 1 0  PHQ - 2 Score 0 0 0 1 0  Altered sleeping 0 0 0 - -  Tired, decreased energy 0 0 0 - -  Change in appetite 0 0 0 - -  Feeling bad or failure about yourself  0 0 0 - -  Trouble concentrating 0 0 0 - -  Moving slowly or fidgety/restless 0 0 0 - -  Suicidal thoughts 0 0 0 - -  PHQ-9 Score 0 0 0 - -  Difficult doing  work/chores Not difficult at all Not difficult at all Not difficult at all - -     PHQ reviewed. Negative  Patient Active Problem List   Diagnosis Date Noted  . Lumbar facet arthropathy 11/06/2018  . Prediabetes 02/27/2018  . Medication monitoring encounter 11/13/2017  . Abnormal laboratory test 10/16/2017  . Breast pain in female 08/10/2017  . Obesity (BMI 30.0-34.9) 08/10/2017  . Protein in urine 08/10/2017  . Vaginal atrophy 08/10/2017  . Benign essential HTN 11/16/2016  . Hyperlipidemia, mixed 11/16/2016  . SOBOE (shortness of breath on exertion) 11/16/2016  . Stable angina pectoris ([place]) 11/16/2016  . Dysrhythmia 07/07/2016  . Right low back pain 07/07/2016  . Fall on or from stairs or steps 07/07/2016  . Family history of malignant neoplasm of gastrointestinal tract   . Benign neoplasm of ascending colon   . Benign neoplasm of transverse colon   . Benign neoplasm of descending colon   . Goiter 03/03/2015  . Asthma, mild persistent 03/03/2015  . Gastroesophageal reflux disease without esophagitis 03/03/2015    Past Medical History:  Diagnosis Date  . Allergy   . Arthritis    feet  . Asthma   . Breast neoplasm   . Chronic lower back pain    s/p fall injury  . Depression   . Dysrhythmia   . Fibromyalgia   .  Hypertension   . Hypothyroidism   . Lumbar facet arthropathy 11/06/2018  . Prediabetes 02/27/2018  . Sciatica of right side   . Wears dentures    partial upper and lower    Past Surgical History:  Procedure Laterality Date  . ABDOMINAL HYSTERECTOMY    . BREAST SURGERY    . COLONOSCOPY WITH PROPOFOL N/A 05/13/2016   Procedure: COLONOSCOPY WITH PROPOFOL;  Surgeon: Lucilla Lame, MD;  Location: [place];  Service: Endoscopy;  Laterality: N/A;  . POLYPECTOMY N/A 05/13/2016   Procedure: POLYPECTOMY;  Surgeon: Lucilla Lame, MD;  Location: [place];  Service: Endoscopy;  Laterality: N/A;    Social History   Tobacco Use  . Smoking status:  Never Smoker  . Smokeless tobacco: Never Used  Substance Use Topics  . Alcohol use: No    Alcohol/week: 0.0 standard drinks     Current Outpatient Medications:  .  atorvastatin (LIPITOR) 10 MG tablet, TAKE ONE TABLET BY MOUTH EVERY NIGHT AT BEDTIME (Patient taking differently: No sig reported), Disp: 30 tablet, Rfl: 4 .  BREO ELLIPTA 100-25 MCG/INH AEPB, INHALE ONE DOSE BY MOUTH DAILY, Disp: 60 each, Rfl: 5 .  COREG CR 80 MG 24 hr capsule, TAKE ONE CAPSULE BY MOUTH DAILY, Disp: 30 capsule, Rfl: 0 .  diphenhydrAMINE (BENADRYL ALLERGY) 25 MG tablet, Take 2 tablets (50 mg total) by mouth every 4 (four) hours. Take 50mg  by mouth every 4 hours until your swelling has completely resolved. Then take every 4 hours by mouth as needed for swelling., Disp: 30 tablet, Rfl: 0 .  EPINEPHrine (EPIPEN 2-PAK) 0.3 mg/0.3 mL IJ SOAJ injection, Use per package directions; one injection subcutaneously for LIFE-THREATENING emergency, deadly reaction, Disp: 2 Device, Rfl: 1 .  nitroGLYCERIN (NITROSTAT) 0.4 MG SL tablet, Place 1 tablet (0.4 mg total) under the tongue every 5 (five) minutes as needed for chest pain. Max 3 pills per episode, Disp: 50 tablet, Rfl: 3 .  SYNTHROID 50 MCG tablet, TAKE 1 BY MOUTH DAILY. DO NOT SUBSTITUTE, Disp: , Rfl: 0 .  VENTOLIN HFA 108 (90 Base) MCG/ACT inhaler, INHALE 2 PUFFS EVERY 6 HOURS AS NEEDED FOR SHORTNESS OF BREATH, Disp: 18 each, Rfl: 2 .  aspirin EC 81 MG tablet, Take 1 tablet (81 mg total) by mouth daily. (Patient not taking: Reported on 12/11/2018), Disp: , Rfl:  .  fexofenadine (ALLEGRA ALLERGY) 180 MG tablet, Take 1 tablet (180 mg total) by mouth daily. (Patient not taking: Reported on 12/11/2018), Disp: 30 tablet, Rfl: 0 .  promethazine-dextromethorphan (PROMETHAZINE-DM) 6.25-15 MG/5ML syrup, Take 5 mLs by mouth 4 (four) times daily as needed for cough. (Patient not taking: Reported on 12/11/2018), Disp: 180 mL, Rfl: 0 .  ranitidine (ZANTAC) 150 MG tablet, TAKE ONE TABLET BY  MOUTH TWICE A DAY (Patient not taking: Reported on 08/30/2018), Disp: 60 tablet, Rfl: 11 .  triamterene-hydrochlorothiazide (MAXZIDE-25) 37.5-25 MG tablet, Take 0.5 tablets by mouth daily. (Patient not taking: Reported on 12/11/2018), Disp: 45 tablet, Rfl: 3  Allergies  Allergen Reactions  . Molds & Smuts Anaphylaxis  . Penicillins Nausea And Vomiting and Rash    Has patient had a PCN reaction causing immediate rash, facial/tongue/throat swelling, SOB or lightheadedness with hypotension: Yes Has patient had a PCN reaction causing severe rash involving mucus membranes or skin necrosis: No Has patient had a PCN reaction that required hospitalization: No Has patient had a PCN reaction occurring within the last 10 years: No If all of the above answers are "NO",  then may proceed with Cephalosporin use.     ROS   No other specific complaints in a complete review of systems (except as listed in HPI above).  Objective  Patient is alert, able to speak in full sentences without difficulty.  No cough or hoarse voice noted during call   Assessment & Plan 1. Seasonal allergies - fexofenadine (ALLEGRA ALLERGY) 180 MG tablet; Take 1 tablet (180 mg total) by mouth daily.  Dispense: 30 tablet; Refill: 0 - fluticasone (FLONASE) 50 MCG/ACT nasal spray; Place 2 sprays into both nostrils daily.  Dispense: 16 g; Refill: 6  2. Cough Discussed likely allergic, low suspicion for COVID19 but still recommend self-quarantine for protection  - promethazine-dextromethorphan (PROMETHAZINE-DM) 6.25-15 MG/5ML syrup; Take 5 mLs by mouth 4 (four) times daily as needed for cough.  Dispense: 180 mL; Refill: 0  3. Benign essential HTN Patient not taking maxide, will hold this. States she thinks her sister next door who she sees regularly has one. Discussed not to go out to get the cuff but if available can check and report to [place] and we can adjust medicines in telephone visit as needed.   4. Prediabetes Discussed  limiting high carb foods, and healthy social distancing exercise  5. Hyperlipidemia, mixed Discussed diet to improve HDL  6. Vitamin D deficiency Continue OTC supplementation       I discussed the assessment and treatment plan with the patient. The patient was provided an opportunity to ask questions and all were answered. The patient agreed with the plan and demonstrated an understanding of the instructions.   The patient was advised to call back or seek an in-person evaluation if the symptoms worsen or if the condition fails to improve as anticipated.  I provided 13 minutes of non-face-to-face time during this encounter.   Fredderick Severance, NP

## 2018-12-11 NOTE — Telephone Encounter (Signed)
Copied from [place] [id number]. Topic: General - Other >> Dec 11, 2018 11:50 AM Sonya Harper wrote: Reason for CRM: Pt called in and stated that get this cough every single year and she is normally given some type of cough med for it.  She has no other symptoms other then the cough.  She would like to know if this could be called in for her without an [place] number  [phone]

## 2018-12-19 ENCOUNTER — Telehealth: Payer: Self-pay | Admitting: Family Medicine

## 2018-12-19 NOTE — Telephone Encounter (Signed)
I don't show any record of prescribing any cough medicine recently The chart shows that she was evaluated by another provider, so I'll route this to that provider who is already familiar with the story

## 2018-12-19 NOTE — Telephone Encounter (Signed)
Copied from [place] [id number]. Topic: Quick Communication - See Telephone Encounter >> Dec 19, 2018 11:55 AM Robina Ade, Helene Kelp D wrote: CRM for notification. See Telephone encounter for: 12/19/18. Patient call and said that the cough medication Dr. Sanda Klein gave her is not working if she can send something else for her, please.

## 2018-12-20 ENCOUNTER — Other Ambulatory Visit: Payer: Self-pay | Admitting: Nurse Practitioner

## 2018-12-20 MED ORDER — BENZONATATE 100 MG PO CAPS: 200.0000 mg | ORAL_CAPSULE | Freq: Three times a day (TID) | ORAL | 0 refills | Status: DC | PRN

## 2018-12-20 NOTE — Telephone Encounter (Signed)
Benzonatate sent to pharmacy.

## 2018-12-24 ENCOUNTER — Telehealth: Payer: Self-pay | Admitting: Family Medicine

## 2018-12-24 NOTE — Telephone Encounter (Signed)
Copied from [place] [id number]. Topic: Quick Communication - See Telephone Encounter >> Dec 24, 2018  3:27 PM Robina Ade, Helene Kelp D wrote: CRM for notification. See Telephone encounter for: 12/24/18. Patient said that the medication Dr. Sanda Klein gave her isn't working and would like to know if Dr. Sanda Klein can give her something stronger. Please call patient back, thanks.

## 2018-12-25 ENCOUNTER — Ambulatory Visit (INDEPENDENT_AMBULATORY_CARE_PROVIDER_SITE_OTHER): Payer: Medicare Other | Admitting: Nurse Practitioner

## 2018-12-25 ENCOUNTER — Encounter: Payer: Self-pay | Admitting: Nurse Practitioner

## 2018-12-25 VITALS — Temp 97.6°F

## 2018-12-25 DIAGNOSIS — R05 Cough: Secondary | ICD-10-CM | POA: Diagnosis not present

## 2018-12-25 DIAGNOSIS — R059 Cough, unspecified: Secondary | ICD-10-CM

## 2018-12-25 MED ORDER — HYDROCODONE-HOMATROPINE 5-1.5 MG/5ML PO SYRP: 2.5000 mL | ORAL_SOLUTION | Freq: Three times a day (TID) | ORAL | 0 refills | Status: DC | PRN

## 2018-12-25 NOTE — Telephone Encounter (Signed)
Cough has been going on for 3 weeks, scheduled patient for 12/25/18 at 9:20am with Benjamine Mola, NP.

## 2018-12-25 NOTE — Telephone Encounter (Signed)
If cough is unimproved for 2 weeks can she do a telephone encounter to re-assess?

## 2018-12-25 NOTE — Telephone Encounter (Signed)
I didn't prescribe anything recently that I can see other than maintenance med; cough medicines have come from another provider Routing to provider who prescribed her medicine and is aware of situation

## 2018-12-25 NOTE — Progress Notes (Signed)
Virtual Visit via Telephone Note  I connected with Sonya Harper on 12/25/18 at  9:20 AM EDT by telephone and verified that I am speaking with the correct person using two identifiers.   Staff discussed the limitations, risks, security and privacy concerns of performing an evaluation and management service by telephone and the availability of in person appointments. Staff also discussed with the patient that there may be a patient responsible charge related to this service. The patient expressed understanding and agreed to proceed.  Patients location: home My location: work office  Other people in meeting: none   HPI  Patient was seen 2 weeks ago for what she considered her annual seasonal allergies had itchy throat, watery eyes, nasal congestion and dry cough. Due to pandemic patient was instructed to self-quarantine. Patient was rx allegra, flonase and promethazine-dm without much relief of coughing. Rx for tessalon perls given afterwards.  States she has post nasal drip, states triggers cough and she gets coughing spills and chokes a bit on that. States only had one episode of choking yesterday, but has not had any before or since. States has sinus pain- more painful on the right side  No fever, chills, shortness of breath, chest pain,   PHQ2/9: Depression screen [facility] 2/9 12/25/2018 12/11/2018 08/30/2018 02/26/2018 11/13/2017  Decreased Interest 0 0 0 0 0  Down, Depressed, Hopeless 0 0 0 0 1  PHQ - 2 Score 0 0 0 0 1  Altered sleeping 0 0 0 0 -  Tired, decreased energy 0 0 0 0 -  Change in appetite 0 0 0 0 -  Feeling bad or failure about yourself  0 0 0 0 -  Trouble concentrating 0 0 0 0 -  Moving slowly or fidgety/restless 0 0 0 0 -  Suicidal thoughts 0 0 0 0 -  PHQ-9 Score 0 0 0 0 -  Difficult doing work/chores Not difficult at all Not difficult at all Not difficult at all Not difficult at all -    PHQ reviewed. Negative  Patient Active Problem List   Diagnosis Date Noted  .  Lumbar facet arthropathy 11/06/2018  . Prediabetes 02/27/2018  . Medication monitoring encounter 11/13/2017  . Abnormal laboratory test 10/16/2017  . Breast pain in female 08/10/2017  . Obesity (BMI 30.0-34.9) 08/10/2017  . Protein in urine 08/10/2017  . Vaginal atrophy 08/10/2017  . Benign essential HTN 11/16/2016  . Hyperlipidemia, mixed 11/16/2016  . SOBOE (shortness of breath on exertion) 11/16/2016  . Stable angina pectoris ([place]) 11/16/2016  . Dysrhythmia 07/07/2016  . Right low back pain 07/07/2016  . Fall on or from stairs or steps 07/07/2016  . Family history of malignant neoplasm of gastrointestinal tract   . Benign neoplasm of ascending colon   . Benign neoplasm of transverse colon   . Benign neoplasm of descending colon   . Goiter 03/03/2015  . Asthma, mild persistent 03/03/2015  . Gastroesophageal reflux disease without esophagitis 03/03/2015    Past Medical History:  Diagnosis Date  . Allergy   . Arthritis    feet  . Asthma   . Breast neoplasm   . Chronic lower back pain    s/p fall injury  . Depression   . Dysrhythmia   . Fibromyalgia   . Hypertension   . Hypothyroidism   . Lumbar facet arthropathy 11/06/2018  . Prediabetes 02/27/2018  . Sciatica of right side   . Wears dentures    partial upper and lower    Past  Surgical History:  Procedure Laterality Date  . ABDOMINAL HYSTERECTOMY    . BREAST SURGERY    . COLONOSCOPY WITH PROPOFOL N/A 05/13/2016   Procedure: COLONOSCOPY WITH PROPOFOL;  Surgeon: Lucilla Lame, MD;  Location: [place];  Service: Endoscopy;  Laterality: N/A;  . POLYPECTOMY N/A 05/13/2016   Procedure: POLYPECTOMY;  Surgeon: Lucilla Lame, MD;  Location: [place];  Service: Endoscopy;  Laterality: N/A;    Social History   Tobacco Use  . Smoking status: Never Smoker  . Smokeless tobacco: Never Used  Substance Use Topics  . Alcohol use: No    Alcohol/week: 0.0 standard drinks     Current Outpatient Medications:  .   atorvastatin (LIPITOR) 10 MG tablet, TAKE ONE TABLET BY MOUTH EVERY NIGHT AT BEDTIME (Patient taking differently: No sig reported), Disp: 30 tablet, Rfl: 4 .  benzonatate (TESSALON PERLES) 100 MG capsule, Take 2 capsules (200 mg total) by mouth 3 (three) times daily as needed for cough., Disp: 30 capsule, Rfl: 0 .  BREO ELLIPTA 100-25 MCG/INH AEPB, INHALE ONE DOSE BY MOUTH DAILY, Disp: 3 each, Rfl: 1 .  COREG CR 80 MG 24 hr capsule, TAKE ONE CAPSULE BY MOUTH DAILY, Disp: 30 capsule, Rfl: 0 .  diphenhydrAMINE (BENADRYL ALLERGY) 25 MG tablet, Take 2 tablets (50 mg total) by mouth every 4 (four) hours. Take 50mg  by mouth every 4 hours until your swelling has completely resolved. Then take every 4 hours by mouth as needed for swelling., Disp: 30 tablet, Rfl: 0 .  EPINEPHrine (EPIPEN 2-PAK) 0.3 mg/0.3 mL IJ SOAJ injection, Use per package directions; one injection subcutaneously for LIFE-THREATENING emergency, deadly reaction, Disp: 2 Device, Rfl: 1 .  fluticasone (FLONASE) 50 MCG/ACT nasal spray, Place 2 sprays into both nostrils daily., Disp: 16 g, Rfl: 6 .  nitroGLYCERIN (NITROSTAT) 0.4 MG SL tablet, Place 1 tablet (0.4 mg total) under the tongue every 5 (five) minutes as needed for chest pain. Max 3 pills per episode, Disp: 50 tablet, Rfl: 3 .  promethazine-dextromethorphan (PROMETHAZINE-DM) 6.25-15 MG/5ML syrup, Take 5 mLs by mouth 4 (four) times daily as needed for cough., Disp: 180 mL, Rfl: 0 .  SYNTHROID 50 MCG tablet, TAKE 1 BY MOUTH DAILY. DO NOT SUBSTITUTE, Disp: , Rfl: 0 .  VENTOLIN HFA 108 (90 Base) MCG/ACT inhaler, INHALE 2 PUFFS EVERY 6 HOURS AS NEEDED FOR SHORTNESS OF BREATH, Disp: 18 each, Rfl: 2 .  aspirin EC 81 MG tablet, Take 1 tablet (81 mg total) by mouth daily. (Patient not taking: Reported on 12/11/2018), Disp: , Rfl:  .  fexofenadine (ALLEGRA ALLERGY) 180 MG tablet, Take 1 tablet (180 mg total) by mouth daily. (Patient not taking: Reported on 12/25/2018), Disp: 30 tablet, Rfl: 0 .   HYDROcodone-homatropine (HYCODAN) 5-1.5 MG/5ML syrup, Take 2.5 mLs by mouth every 8 (eight) hours as needed for cough., Disp: 120 mL, Rfl: 0 .  triamterene-hydrochlorothiazide (MAXZIDE-25) 37.5-25 MG tablet, Take 0.5 tablets by mouth daily. (Patient not taking: Reported on 12/11/2018), Disp: 45 tablet, Rfl: 3  Allergies  Allergen Reactions  . Molds & Smuts Anaphylaxis  . Penicillins Nausea And Vomiting and Rash    Has patient had a PCN reaction causing immediate rash, facial/tongue/throat swelling, SOB or lightheadedness with hypotension: Yes Has patient had a PCN reaction causing severe rash involving mucus membranes or skin necrosis: No Has patient had a PCN reaction that required hospitalization: No Has patient had a PCN reaction occurring within the last 10 years: No If all of the  above answers are "NO", then may proceed with Cephalosporin use.     ROS    No other specific complaints in a complete review of systems (except as listed in HPI above).  Objective  Vitals:   12/25/18 0930  Temp: 97.6 F (36.4 C)     There is no height or weight on file to calculate BMI.  Patient is alert, able to speak in full sentences without difficulty.   Assessment & Plan  1. Cough Stop pearls and phenergan DM, states has taken this in the past. Discussed risks and benefits believe it is appropriate in small doses. If unimproved in 3 days can consider steroids or antibiotics if worsening - HYDROcodone-homatropine (HYCODAN) 5-1.5 MG/5ML syrup; Take 2.5 mLs by mouth every 8 (eight) hours as needed for cough.  Dispense: 120 mL; Refill: 0    I discussed the assessment and treatment plan with the patient. The patient was provided an opportunity to ask questions and all were answered. The patient agreed with the plan and demonstrated an understanding of the instructions.   The patient was advised to call back or seek an in-person evaluation if the symptoms worsen or if the condition fails to  improve as anticipated.  I provided 7 minutes of non-face-to-face time during this encounter.   Fredderick Severance, NP

## 2018-12-28 ENCOUNTER — Ambulatory Visit (INDEPENDENT_AMBULATORY_CARE_PROVIDER_SITE_OTHER): Payer: Medicare Other | Admitting: Nurse Practitioner

## 2018-12-28 ENCOUNTER — Telehealth: Payer: Self-pay | Admitting: Nurse Practitioner

## 2018-12-28 ENCOUNTER — Other Ambulatory Visit: Payer: Self-pay

## 2018-12-28 ENCOUNTER — Encounter: Payer: Self-pay | Admitting: Nurse Practitioner

## 2018-12-28 DIAGNOSIS — J40 Bronchitis, not specified as acute or chronic: Secondary | ICD-10-CM

## 2018-12-28 MED ORDER — AZITHROMYCIN 250 MG PO TABS: ORAL_TABLET | ORAL | 0 refills | Status: DC

## 2018-12-28 NOTE — Telephone Encounter (Signed)
Pt has been schedule for today, 12/28/2018 @ 1:40pm

## 2018-12-28 NOTE — Telephone Encounter (Signed)
Copied from [place] [id number]. Topic: General - Other >> Dec 28, 2018  8:33 AM Celene Kras A wrote: Reason for CRM: Pt called stating Benjamine Mola told her after 3 days if she needed an antibiotic to let her know and she would right a prescription for her. Pt states she needs an antibiotic. Please advise.  [place], [address] [place] [address] Phone: [phone] Fax: [phone] Not a 24 hour pharmacy; exact hours not known.

## 2018-12-28 NOTE — Progress Notes (Signed)
Virtual Visit via Telephone Note  I connected with Sonya Harper on 12/28/18 at  1:40 PM EDT by telephone and verified that I am speaking with the correct person using two identifiers.   Staff discussed the limitations, risks, security and privacy concerns of performing an evaluation and management service by telephone and the availability of in person appointments. Staff also discussed with the patient that there may be a patient responsible charge related to this service. The patient expressed understanding and agreed to proceed.  Patients location: home My location: home office  Other people in meeting: none    HPI  Patient has had cough for greater than 3 weeks that has acutely worsened after some improvement. States cough medicine has helped some but states the cough is still strong and states gets coughing spells, states not gagging now. States some nasal congestion. States cough is interrupting sleep. States usually dry cough but sometimes clear sputum. States if she does not treat early it usually gets bad.   Denies shortness of breath, fevers, chills.    PHQ2/9: Depression screen [facility] 2/9 12/28/2018 12/25/2018 12/11/2018 08/30/2018 02/26/2018  Decreased Interest 0 0 0 0 0  Down, Depressed, Hopeless 0 0 0 0 0  PHQ - 2 Score 0 0 0 0 0  Altered sleeping 0 0 0 0 0  Tired, decreased energy 0 0 0 0 0  Change in appetite 0 0 0 0 0  Feeling bad or failure about yourself  0 0 0 0 0  Trouble concentrating 0 0 0 0 0  Moving slowly or fidgety/restless 0 0 0 0 0  Suicidal thoughts 0 0 0 0 0  PHQ-9 Score 0 0 0 0 0  Difficult doing work/chores Not difficult at all Not difficult at all Not difficult at all Not difficult at all Not difficult at all    PHQ reviewed. Negative  Patient Active Problem List   Diagnosis Date Noted  . Lumbar facet arthropathy 11/06/2018  . Prediabetes 02/27/2018  . Medication monitoring encounter 11/13/2017  . Abnormal laboratory test 10/16/2017  . Breast pain  in female 08/10/2017  . Obesity (BMI 30.0-34.9) 08/10/2017  . Protein in urine 08/10/2017  . Vaginal atrophy 08/10/2017  . Benign essential HTN 11/16/2016  . Hyperlipidemia, mixed 11/16/2016  . SOBOE (shortness of breath on exertion) 11/16/2016  . Stable angina pectoris ([place]) 11/16/2016  . Dysrhythmia 07/07/2016  . Right low back pain 07/07/2016  . Fall on or from stairs or steps 07/07/2016  . Family history of malignant neoplasm of gastrointestinal tract   . Benign neoplasm of ascending colon   . Benign neoplasm of transverse colon   . Benign neoplasm of descending colon   . Goiter 03/03/2015  . Asthma, mild persistent 03/03/2015  . Gastroesophageal reflux disease without esophagitis 03/03/2015    Past Medical History:  Diagnosis Date  . Allergy   . Arthritis    feet  . Asthma   . Breast neoplasm   . Chronic lower back pain    s/p fall injury  . Depression   . Dysrhythmia   . Fibromyalgia   . Hypertension   . Hypothyroidism   . Lumbar facet arthropathy 11/06/2018  . Prediabetes 02/27/2018  . Sciatica of right side   . Wears dentures    partial upper and lower    Past Surgical History:  Procedure Laterality Date  . ABDOMINAL HYSTERECTOMY    . BREAST SURGERY    . COLONOSCOPY WITH PROPOFOL N/A 05/13/2016  Procedure: COLONOSCOPY WITH PROPOFOL;  Surgeon: Lucilla Lame, MD;  Location: [facility];  Service: Endoscopy;  Laterality: N/A;  . POLYPECTOMY N/A 05/13/2016   Procedure: POLYPECTOMY;  Surgeon: Lucilla Lame, MD;  Location: [facility];  Service: Endoscopy;  Laterality: N/A;    Social History   Tobacco Use  . Smoking status: Never Smoker  . Smokeless tobacco: Never Used  Substance Use Topics  . Alcohol use: No    Alcohol/week: 0.0 standard drinks     Current Outpatient Medications:  .  aspirin EC 81 MG tablet, Take 1 tablet (81 mg total) by mouth daily., Disp: , Rfl:  .  atorvastatin (LIPITOR) 10 MG tablet, TAKE ONE TABLET BY MOUTH EVERY NIGHT AT  BEDTIME (Patient taking differently: No sig reported), Disp: 30 tablet, Rfl: 4 .  benzonatate (TESSALON PERLES) 100 MG capsule, Take 2 capsules (200 mg total) by mouth 3 (three) times daily as needed for cough., Disp: 30 capsule, Rfl: 0 .  BREO ELLIPTA 100-25 MCG/INH AEPB, INHALE ONE DOSE BY MOUTH DAILY, Disp: 3 each, Rfl: 1 .  COREG CR 80 MG 24 hr capsule, TAKE ONE CAPSULE BY MOUTH DAILY, Disp: 30 capsule, Rfl: 0 .  diphenhydrAMINE (BENADRYL ALLERGY) 25 MG tablet, Take 2 tablets (50 mg total) by mouth every 4 (four) hours. Take 50mg  by mouth every 4 hours until your swelling has completely resolved. Then take every 4 hours by mouth as needed for swelling., Disp: 30 tablet, Rfl: 0 .  EPINEPHrine (EPIPEN 2-PAK) 0.3 mg/0.3 mL IJ SOAJ injection, Use per package directions; one injection subcutaneously for LIFE-THREATENING emergency, deadly reaction, Disp: 2 Device, Rfl: 1 .  fexofenadine (ALLEGRA ALLERGY) 180 MG tablet, Take 1 tablet (180 mg total) by mouth daily., Disp: 30 tablet, Rfl: 0 .  fluticasone (FLONASE) 50 MCG/ACT nasal spray, Place 2 sprays into both nostrils daily., Disp: 16 g, Rfl: 6 .  HYDROcodone-homatropine (HYCODAN) 5-1.5 MG/5ML syrup, Take 2.5 mLs by mouth every 8 (eight) hours as needed for cough., Disp: 120 mL, Rfl: 0 .  nitroGLYCERIN (NITROSTAT) 0.4 MG SL tablet, Place 1 tablet (0.4 mg total) under the tongue every 5 (five) minutes as needed for chest pain. Max 3 pills per episode, Disp: 50 tablet, Rfl: 3 .  SYNTHROID 50 MCG tablet, TAKE 1 BY MOUTH DAILY. DO NOT SUBSTITUTE, Disp: , Rfl: 0 .  triamterene-hydrochlorothiazide (MAXZIDE-25) 37.5-25 MG tablet, Take 0.5 tablets by mouth daily., Disp: 45 tablet, Rfl: 3 .  VENTOLIN HFA 108 (90 Base) MCG/ACT inhaler, INHALE 2 PUFFS EVERY 6 HOURS AS NEEDED FOR SHORTNESS OF BREATH, Disp: 18 each, Rfl: 2 .  promethazine-dextromethorphan (PROMETHAZINE-DM) 6.25-15 MG/5ML syrup, Take 5 mLs by mouth 4 (four) times daily as needed for cough. (Patient  not taking: Reported on 12/28/2018), Disp: 180 mL, Rfl: 0  Allergies  Allergen Reactions  . Molds & Smuts Anaphylaxis  . Penicillins Nausea And Vomiting and Rash    Has patient had a PCN reaction causing immediate rash, facial/tongue/throat swelling, SOB or lightheadedness with hypotension: Yes Has patient had a PCN reaction causing severe rash involving mucus membranes or skin necrosis: No Has patient had a PCN reaction that required hospitalization: No Has patient had a PCN reaction occurring within the last 10 years: No If all of the above answers are "NO", then may proceed with Cephalosporin use.     ROS   No other specific complaints in a complete review of systems (except as listed in HPI above).  Objective  There were no  vitals filed for this visit.   There is no height or weight on file to calculate BMI.  Patient is alert, able to speak in full sentences without difficulty.   Assessment & Plan  1. Bronchitis Discussed typically viral in nature however due to pandemic unable to appropriately assess and due to age, acute worsening, length of time and patient notes she quickly gets worse- think it is appropriate to treat with antibiotics at this time  - azithromycin (ZITHROMAX) 250 MG tablet; Take 2 tablets today and 1 tablet for the next 4 days.  Dispense: 6 tablet; Refill: 0    I discussed the assessment and treatment plan with the patient. The patient was provided an opportunity to ask questions and all were answered. The patient agreed with the plan and demonstrated an understanding of the instructions.   The patient was advised to call back or seek an in-person evaluation if the symptoms worsen or if the condition fails to improve as anticipated.  I provided 12 minutes of non-face-to-face time during this encounter.   Fredderick Severance, NP

## 2018-12-28 NOTE — Telephone Encounter (Signed)
can we make Sonya Harper into a telephone appointment- I had told her if she wasnt better she MAY need antibiotics or a steroid. Would like to see which one would be most appropriate for her.

## 2018-12-29 ENCOUNTER — Other Ambulatory Visit: Payer: Self-pay | Admitting: Family Medicine

## 2018-12-29 NOTE — Telephone Encounter (Signed)
Chart suggests that she is taking this every other night Refill approved

## 2019-02-01 ENCOUNTER — Telehealth: Payer: Self-pay | Admitting: Family Medicine

## 2019-02-01 NOTE — Telephone Encounter (Signed)
Do not routinely fill controlled substances. Additionally if her cough is still ongoing patient needs visit for re-evaluation.

## 2019-02-01 NOTE — Telephone Encounter (Signed)
Copied from [place] [id number]. Topic: Quick Communication - Rx Refill/Question >> Feb 01, 2019  3:26 PM Celene Kras A wrote: Medication: HYDROcodone-homatropine (HYCODAN) 5-1.5 MG/5ML syrup and pt also states she was on an antibiotic. Please advise.   Has the patient contacted their pharmacy? No. (Agent: If no, request that the patient contact the pharmacy for the refill.) (Agent: If yes, when and what did the pharmacy advise?)  Preferred Pharmacy (with phone number or street name): [place], [address] [place] [address] Phone: [phone] Fax: [phone] Not a 24 hour pharmacy; exact hours not known.    Agent: Please be advised that RX refills may take up to 3 business days. We ask that you follow-up with your pharmacy.

## 2019-02-04 NOTE — Telephone Encounter (Signed)
Left detailed VM.  

## 2019-02-05 ENCOUNTER — Ambulatory Visit: Payer: Medicare Other | Admitting: Nurse Practitioner

## 2019-02-06 ENCOUNTER — Other Ambulatory Visit: Payer: Self-pay

## 2019-02-06 ENCOUNTER — Ambulatory Visit: Payer: Medicare Other | Admitting: Nurse Practitioner

## 2019-02-07 ENCOUNTER — Other Ambulatory Visit: Payer: Self-pay

## 2019-02-07 ENCOUNTER — Ambulatory Visit: Payer: Medicare Other | Admitting: Nurse Practitioner

## 2019-02-07 NOTE — Progress Notes (Signed)
No show

## 2019-02-15 DIAGNOSIS — R002 Palpitations: Secondary | ICD-10-CM | POA: Diagnosis not present

## 2019-02-15 DIAGNOSIS — I10 Essential (primary) hypertension: Secondary | ICD-10-CM | POA: Diagnosis not present

## 2019-02-15 DIAGNOSIS — E782 Mixed hyperlipidemia: Secondary | ICD-10-CM | POA: Diagnosis not present

## 2019-02-15 DIAGNOSIS — R000 Tachycardia, unspecified: Secondary | ICD-10-CM | POA: Diagnosis not present

## 2019-02-15 DIAGNOSIS — R0602 Shortness of breath: Secondary | ICD-10-CM | POA: Diagnosis not present

## 2019-03-12 ENCOUNTER — Ambulatory Visit: Payer: Medicare Other | Admitting: Nurse Practitioner

## 2019-03-18 DIAGNOSIS — Z1159 Encounter for screening for other viral diseases: Secondary | ICD-10-CM | POA: Diagnosis not present

## 2019-03-27 DIAGNOSIS — R000 Tachycardia, unspecified: Secondary | ICD-10-CM | POA: Diagnosis not present

## 2019-03-27 DIAGNOSIS — R002 Palpitations: Secondary | ICD-10-CM | POA: Diagnosis not present

## 2019-03-29 DIAGNOSIS — E042 Nontoxic multinodular goiter: Secondary | ICD-10-CM | POA: Diagnosis not present

## 2019-03-29 DIAGNOSIS — E039 Hypothyroidism, unspecified: Secondary | ICD-10-CM | POA: Diagnosis not present

## 2019-04-12 DIAGNOSIS — E042 Nontoxic multinodular goiter: Secondary | ICD-10-CM | POA: Diagnosis not present

## 2019-04-12 DIAGNOSIS — E039 Hypothyroidism, unspecified: Secondary | ICD-10-CM | POA: Diagnosis not present

## 2019-04-25 DIAGNOSIS — R000 Tachycardia, unspecified: Secondary | ICD-10-CM | POA: Diagnosis not present

## 2019-04-25 DIAGNOSIS — R002 Palpitations: Secondary | ICD-10-CM | POA: Diagnosis not present

## 2019-04-25 DIAGNOSIS — E782 Mixed hyperlipidemia: Secondary | ICD-10-CM | POA: Diagnosis not present

## 2019-04-25 DIAGNOSIS — R079 Chest pain, unspecified: Secondary | ICD-10-CM | POA: Diagnosis not present

## 2019-04-25 DIAGNOSIS — I10 Essential (primary) hypertension: Secondary | ICD-10-CM | POA: Diagnosis not present

## 2019-05-08 DIAGNOSIS — I4891 Unspecified atrial fibrillation: Secondary | ICD-10-CM | POA: Diagnosis not present

## 2019-05-08 DIAGNOSIS — Z1159 Encounter for screening for other viral diseases: Secondary | ICD-10-CM | POA: Diagnosis not present

## 2019-05-10 ENCOUNTER — Encounter: Payer: Self-pay | Admitting: Nurse Practitioner

## 2019-05-10 ENCOUNTER — Ambulatory Visit (INDEPENDENT_AMBULATORY_CARE_PROVIDER_SITE_OTHER): Payer: Medicare Other | Admitting: Nurse Practitioner

## 2019-05-10 ENCOUNTER — Other Ambulatory Visit: Payer: Self-pay

## 2019-05-10 DIAGNOSIS — R05 Cough: Secondary | ICD-10-CM

## 2019-05-10 DIAGNOSIS — R053 Chronic cough: Secondary | ICD-10-CM

## 2019-05-10 DIAGNOSIS — K2100 Gastro-esophageal reflux disease with esophagitis, without bleeding: Secondary | ICD-10-CM

## 2019-05-10 MED ORDER — PANTOPRAZOLE SODIUM 40 MG PO TBEC: 40.0000 mg | DELAYED_RELEASE_TABLET | Freq: Every day | ORAL | 0 refills | Status: DC

## 2019-05-10 MED ORDER — PREDNISONE 10 MG (21) PO TBPK: ORAL_TABLET | ORAL | 0 refills | Status: DC

## 2019-05-10 NOTE — Progress Notes (Signed)
Virtual Visit via Telephone Note  I connected with Sonya Harper on 05/10/19 at  2:20 PM EDT by telephone and verified that I am speaking with the correct person using two identifiers.   Staff discussed the limitations, risks, security and privacy concerns of performing an evaluation and management service by telephone and the availability of in person appointments. Staff also discussed with the patient that there may be a patient responsible charge related to this service. The patient expressed understanding and agreed to proceed.  Patients location: home  My location: work office  Other people in meeting: none    HPI  Patient endorses cough, states she has been getting this intermittently for 7 years. States this cough has been ongoing for 4 months and progressively gotten worse. States [facility]- and was told her lungs were clear. She had COVID testing- was negative. States cough is dry, no fevers, chills, shortness of breath. She does note she has indigestion and has been taking famotidine with some relief of symptoms. Has never seen pulmonologist for this chronic cough. Has tried phenergan DM, tessalon perls, mucinex and z-pack without relief.    Patient Active Problem List   Diagnosis Date Noted  . Lumbar facet arthropathy 11/06/2018  . Prediabetes 02/27/2018  . Medication monitoring encounter 11/13/2017  . Abnormal laboratory test 10/16/2017  . Breast pain in female 08/10/2017  . Obesity (BMI 30.0-34.9) 08/10/2017  . Protein in urine 08/10/2017  . Vaginal atrophy 08/10/2017  . Benign essential HTN 11/16/2016  . Hyperlipidemia, mixed 11/16/2016  . SOBOE (shortness of breath on exertion) 11/16/2016  . Stable angina pectoris ([place]) 11/16/2016  . Dysrhythmia 07/07/2016  . Right low back pain 07/07/2016  . Fall on or from stairs or steps 07/07/2016  . Family history of malignant neoplasm of gastrointestinal tract   . Benign neoplasm of ascending colon   .  Benign neoplasm of transverse colon   . Benign neoplasm of descending colon   . Goiter 03/03/2015  . Asthma, mild persistent 03/03/2015  . Gastroesophageal reflux disease without esophagitis 03/03/2015    Past Medical History:  Diagnosis Date  . Allergy   . Arthritis    feet  . Asthma   . Breast neoplasm   . Chronic lower back pain    s/p fall injury  . Depression   . Dysrhythmia   . Fibromyalgia   . Hypertension   . Hypothyroidism   . Lumbar facet arthropathy 11/06/2018  . Prediabetes 02/27/2018  . Sciatica of right side   . Wears dentures    partial upper and lower    Past Surgical History:  Procedure Laterality Date  . ABDOMINAL HYSTERECTOMY    . BREAST SURGERY    . COLONOSCOPY WITH PROPOFOL N/A 05/13/2016   Procedure: COLONOSCOPY WITH PROPOFOL;  Surgeon: Lucilla Lame, MD;  Location: [place];  Service: Endoscopy;  Laterality: N/A;  . POLYPECTOMY N/A 05/13/2016   Procedure: POLYPECTOMY;  Surgeon: Lucilla Lame, MD;  Location: [place];  Service: Endoscopy;  Laterality: N/A;    Social History   Tobacco Use  . Smoking status: Never Smoker  . Smokeless tobacco: Never Used  Substance Use Topics  . Alcohol use: No    Alcohol/week: 0.0 standard drinks     Current Outpatient Medications:  .  aspirin EC 81 MG tablet, Take 1 tablet (81 mg total) by mouth daily., Disp: , Rfl:  .  atorvastatin (LIPITOR) 10 MG tablet, Take 1 tablet (10 mg  total) by mouth every other day., Disp: 45 tablet, Rfl: 1 .  azithromycin (ZITHROMAX) 250 MG tablet, Take 2 tablets today and 1 tablet for the next 4 days., Disp: 6 tablet, Rfl: 0 .  benzonatate (TESSALON PERLES) 100 MG capsule, Take 2 capsules (200 mg total) by mouth 3 (three) times daily as needed for cough., Disp: 30 capsule, Rfl: 0 .  BREO ELLIPTA 100-25 MCG/INH AEPB, INHALE ONE DOSE BY MOUTH DAILY, Disp: 3 each, Rfl: 1 .  COREG CR 80 MG 24 hr capsule, TAKE ONE CAPSULE BY MOUTH DAILY, Disp: 30 capsule, Rfl: 0 .   diphenhydrAMINE (BENADRYL ALLERGY) 25 MG tablet, Take 2 tablets (50 mg total) by mouth every 4 (four) hours. Take 50mg  by mouth every 4 hours until your swelling has completely resolved. Then take every 4 hours by mouth as needed for swelling., Disp: 30 tablet, Rfl: 0 .  EPINEPHrine (EPIPEN 2-PAK) 0.3 mg/0.3 mL IJ SOAJ injection, Use per package directions; one injection subcutaneously for LIFE-THREATENING emergency, deadly reaction, Disp: 2 Device, Rfl: 1 .  fexofenadine (ALLEGRA ALLERGY) 180 MG tablet, Take 1 tablet (180 mg total) by mouth daily., Disp: 30 tablet, Rfl: 0 .  fluticasone (FLONASE) 50 MCG/ACT nasal spray, Place 2 sprays into both nostrils daily., Disp: 16 g, Rfl: 6 .  HYDROcodone-homatropine (HYCODAN) 5-1.5 MG/5ML syrup, Take 2.5 mLs by mouth every 8 (eight) hours as needed for cough., Disp: 120 mL, Rfl: 0 .  nitroGLYCERIN (NITROSTAT) 0.4 MG SL tablet, Place 1 tablet (0.4 mg total) under the tongue every 5 (five) minutes as needed for chest pain. Max 3 pills per episode, Disp: 50 tablet, Rfl: 3 .  promethazine-dextromethorphan (PROMETHAZINE-DM) 6.25-15 MG/5ML syrup, Take 5 mLs by mouth 4 (four) times daily as needed for cough. (Patient not taking: Reported on 12/28/2018), Disp: 180 mL, Rfl: 0 .  SYNTHROID 50 MCG tablet, TAKE 1 BY MOUTH DAILY. DO NOT SUBSTITUTE, Disp: , Rfl: 0 .  triamterene-hydrochlorothiazide (MAXZIDE-25) 37.5-25 MG tablet, Take 0.5 tablets by mouth daily., Disp: 45 tablet, Rfl: 3 .  VENTOLIN HFA 108 (90 Base) MCG/ACT inhaler, INHALE 2 PUFFS EVERY 6 HOURS AS NEEDED FOR SHORTNESS OF BREATH, Disp: 18 each, Rfl: 2  Allergies  Allergen Reactions  . Molds & Smuts Anaphylaxis  . Penicillins Nausea And Vomiting and Rash    Has patient had a PCN reaction causing immediate rash, facial/tongue/throat swelling, SOB or lightheadedness with hypotension: Yes Has patient had a PCN reaction causing severe rash involving mucus membranes or skin necrosis: No Has patient had a PCN  reaction that required hospitalization: No Has patient had a PCN reaction occurring within the last 10 years: No If all of the above answers are "NO", then may proceed with Cephalosporin use.     ROS   No other specific complaints in a complete review of systems (except as listed in HPI above).  Objective  There were no vitals filed for this visit.   There is no height or weight on file to calculate BMI.  Patient is alert, able to speak in full sentences without difficulty.   Assessment & Plan 1. Chronic cough Refer to pulm If not improved  - predniSONE (STERAPRED UNI-PAK 21 TAB) 10 MG (21) TBPK tablet; Take as directed.  Dispense: 21 tablet; Refill: 0 - DG Chest 2 View; Future - pantoprazole (PROTONIX) 40 MG tablet; Take 1 tablet (40 mg total) by mouth daily.  Dispense: 90 tablet; Refill: 0  2. Gastroesophageal reflux disease with esophagitis - pantoprazole (PROTONIX)  40 MG tablet; Take 1 tablet (40 mg total) by mouth daily.  Dispense: 90 tablet; Refill: 0     I discussed the assessment and treatment plan with the patient. The patient was provided an opportunity to ask questions and all were answered. The patient agreed with the plan and demonstrated an understanding of the instructions.   The patient was advised to call back or seek an in-person evaluation if the symptoms worsen or if the condition fails to improve as anticipated.  I provided 12 minutes of non-face-to-face time during this encounter.   Fredderick Severance, NP

## 2019-05-11 ENCOUNTER — Emergency Department
Admission: EM | Admit: 2019-05-11 | Discharge: 2019-05-11 | Disposition: A | Discharge status: Home / Self Care | Payer: Medicare Other | Attending: Emergency Medicine | Admitting: Emergency Medicine

## 2019-05-11 ENCOUNTER — Encounter: Payer: Self-pay | Admitting: Emergency Medicine

## 2019-05-11 ENCOUNTER — Other Ambulatory Visit: Payer: Self-pay

## 2019-05-11 DIAGNOSIS — Z7982 Long term (current) use of aspirin: Secondary | ICD-10-CM | POA: Insufficient documentation

## 2019-05-11 DIAGNOSIS — R079 Chest pain, unspecified: Secondary | ICD-10-CM | POA: Diagnosis not present

## 2019-05-11 DIAGNOSIS — E039 Hypothyroidism, unspecified: Secondary | ICD-10-CM | POA: Diagnosis not present

## 2019-05-11 DIAGNOSIS — I10 Essential (primary) hypertension: Secondary | ICD-10-CM | POA: Insufficient documentation

## 2019-05-11 DIAGNOSIS — I4891 Unspecified atrial fibrillation: Secondary | ICD-10-CM | POA: Diagnosis not present

## 2019-05-11 DIAGNOSIS — Z79899 Other long term (current) drug therapy: Secondary | ICD-10-CM | POA: Insufficient documentation

## 2019-05-11 DIAGNOSIS — J45909 Unspecified asthma, uncomplicated: Secondary | ICD-10-CM | POA: Diagnosis not present

## 2019-05-11 DIAGNOSIS — R000 Tachycardia, unspecified: Secondary | ICD-10-CM | POA: Diagnosis present

## 2019-05-11 LAB — CBC WITH DIFFERENTIAL/PLATELET
Abs Immature Granulocytes: 0 10*3/uL (ref 0.00–0.07)
Basophils Absolute: 0 10*3/uL (ref 0.0–0.1)
Basophils Relative: 0 %
Eosinophils Absolute: 0 10*3/uL (ref 0.0–0.5)
Eosinophils Relative: 0 %
HCT: 46 % (ref 36.0–46.0)
Hemoglobin: 15.1 g/dL — ABNORMAL HIGH (ref 12.0–15.0)
Lymphocytes Relative: 17 %
Lymphs Abs: 1.2 10*3/uL (ref 0.7–4.0)
MCH: 28.9 pg (ref 26.0–34.0)
MCHC: 32.8 g/dL (ref 30.0–36.0)
MCV: 88 fL (ref 80.0–100.0)
Monocytes Absolute: 0.1 10*3/uL (ref 0.1–1.0)
Monocytes Relative: 1 %
Neutro Abs: 5.7 10*3/uL (ref 1.7–7.7)
Neutrophils Relative %: 82 %
Platelets: 255 10*3/uL (ref 150–400)
RBC: 5.23 MIL/uL — ABNORMAL HIGH (ref 3.87–5.11)
RDW: 14 % (ref 11.5–15.5)
Smear Review: NORMAL
WBC: 7 10*3/uL (ref 4.0–10.5)
nRBC: 0 % (ref 0.0–0.2)

## 2019-05-11 LAB — BASIC METABOLIC PANEL
Anion gap: 11 (ref 5–15)
BUN: 17 mg/dL (ref 8–23)
CO2: 23 mmol/L (ref 22–32)
Calcium: 9.3 mg/dL (ref 8.9–10.3)
Chloride: 107 mmol/L (ref 98–111)
Creatinine, Ser: 1.08 mg/dL — ABNORMAL HIGH (ref 0.44–1.00)
GFR calc Af Amer: 60 mL/min — ABNORMAL LOW (ref >60)
GFR calc non Af Amer: 52 mL/min — ABNORMAL LOW (ref >60)
Glucose, Bld: 218 mg/dL — ABNORMAL HIGH (ref 70–99)
Potassium: 4.2 mmol/L (ref 3.5–5.1)
Sodium: 141 mmol/L (ref 135–145)

## 2019-05-11 LAB — TROPONIN I (HIGH SENSITIVITY): Troponin I (High Sensitivity): 16 ng/L (ref <18)

## 2019-05-11 MED ORDER — METOPROLOL SUCCINATE ER 100 MG PO TB24: 100.0000 mg | ORAL_TABLET | Freq: Every day | ORAL | 1 refills | Status: DC

## 2019-05-11 MED ORDER — METOPROLOL TARTRATE 50 MG PO TABS
50.0000 mg | ORAL_TABLET | Freq: Once | ORAL | Status: AC
  Administered 2019-05-11: 50 mg via ORAL
  Filled 2019-05-11: qty 1

## 2019-05-11 MED ORDER — DILTIAZEM HCL 25 MG/5ML IV SOLN
20.0000 mg | Freq: Once | INTRAVENOUS | Status: AC
  Administered 2019-05-11: 20 mg via INTRAVENOUS
  Filled 2019-05-11: qty 5

## 2019-05-11 MED ORDER — DILTIAZEM HCL 25 MG/5ML IV SOLN
15.0000 mg | Freq: Once | INTRAVENOUS | Status: DC
  Filled 2019-05-11: qty 5

## 2019-05-11 MED ORDER — ASPIRIN EC 325 MG PO TBEC: 325.0000 mg | DELAYED_RELEASE_TABLET | Freq: Every day | ORAL | 3 refills | Status: DC

## 2019-05-11 NOTE — ED Notes (Signed)
MD Callwood at bedside

## 2019-05-11 NOTE — ED Provider Notes (Signed)
[facility] Emergency Department Provider Note  ____________________________________________   I have reviewed the triage vital signs and the nursing notes.   HISTORY  Chief Complaint Tachycardia and Chest Pain   History limited by: Not Limited   HPI Sonya Harper is a 73 y.o. female who presents to the emergency department today because of concern for tachycardia and chest pressure. The patient states that she has been having similar episodes for the past 8 months. The patient will have these episodes a couple of times a week. When they occur she feels a pressure in her chest and feels like her heart is racing. The patient had a similar episode earlier today. The patient has been to her cardiologist for this. Has not been told she has atrial fibrillation in the past. Denies any fevers.   Records reviewed. Per medical record review patient has a history of tachycardia and palpitations, per recent cardiology note it appears plan is to have patient wear holter monitor. Do not see history of atrial fibrillation.  Past Medical History:  Diagnosis Date  . Allergy   . Arthritis    feet  . Asthma   . Breast neoplasm   . Chronic lower back pain    s/p fall injury  . Depression   . Dysrhythmia   . Fibromyalgia   . Hypertension   . Hypothyroidism   . Lumbar facet arthropathy 11/06/2018  . Prediabetes 02/27/2018  . Sciatica of right side   . Wears dentures    partial upper and lower    Patient Active Problem List   Diagnosis Date Noted  . Lumbar facet arthropathy 11/06/2018  . Prediabetes 02/27/2018  . Medication monitoring encounter 11/13/2017  . Abnormal laboratory test 10/16/2017  . Breast pain in female 08/10/2017  . Obesity (BMI 30.0-34.9) 08/10/2017  . Protein in urine 08/10/2017  . Vaginal atrophy 08/10/2017  . Benign essential HTN 11/16/2016  . Hyperlipidemia, mixed 11/16/2016  . SOBOE (shortness of breath on exertion) 11/16/2016  . Stable  angina pectoris ([place]) 11/16/2016  . Dysrhythmia 07/07/2016  . Right low back pain 07/07/2016  . Fall on or from stairs or steps 07/07/2016  . Family history of malignant neoplasm of gastrointestinal tract   . Benign neoplasm of ascending colon   . Benign neoplasm of transverse colon   . Benign neoplasm of descending colon   . Goiter 03/03/2015  . Asthma, mild persistent 03/03/2015  . Gastroesophageal reflux disease without esophagitis 03/03/2015    Past Surgical History:  Procedure Laterality Date  . ABDOMINAL HYSTERECTOMY    . BREAST SURGERY    . COLONOSCOPY WITH PROPOFOL N/A 05/13/2016   Procedure: COLONOSCOPY WITH PROPOFOL;  Surgeon: Lucilla Lame, MD;  Location: [place];  Service: Endoscopy;  Laterality: N/A;  . POLYPECTOMY N/A 05/13/2016   Procedure: POLYPECTOMY;  Surgeon: Lucilla Lame, MD;  Location: [place];  Service: Endoscopy;  Laterality: N/A;    Prior to Admission medications   Medication Sig Start Date End Date Taking? Authorizing Provider  aspirin EC 81 MG tablet Take 1 tablet (81 mg total) by mouth daily. Patient not taking: Reported on 05/10/2019 08/30/18   Arnetha Courser, MD  atorvastatin (LIPITOR) 10 MG tablet Take 1 tablet (10 mg total) by mouth every other day. 12/29/18   Lada, Satira Anis, MD  BREO ELLIPTA 100-25 MCG/INH AEPB INHALE ONE DOSE BY MOUTH DAILY 12/12/18   Lada, Satira Anis, MD  EPINEPHrine (EPIPEN 2-PAK) 0.3 mg/0.3 mL IJ SOAJ injection  Use per package directions; one injection subcutaneously for LIFE-THREATENING emergency, deadly reaction 05/16/18   Lada, Satira Anis, MD  fluticasone (FLONASE) 50 MCG/ACT nasal spray Place 2 sprays into both nostrils daily. 12/11/18   Poulose, Bethel Born, NP  metoprolol succinate (TOPROL-XL) 50 MG 24 hr tablet Take 1 tablet by mouth daily. 04/25/19 04/24/20  [provider]  nitroGLYCERIN (NITROSTAT) 0.4 MG SL tablet Place 1 tablet (0.4 mg total) under the tongue every 5 (five) minutes as needed for chest  pain. Max 3 pills per episode 08/30/18   Arnetha Courser, MD  pantoprazole (PROTONIX) 40 MG tablet Take 1 tablet (40 mg total) by mouth daily. 05/10/19   Poulose, Bethel Born, NP  predniSONE (STERAPRED UNI-PAK 21 TAB) 10 MG (21) TBPK tablet Take as directed. 05/10/19   Poulose, Bethel Born, NP  SYNTHROID 50 MCG tablet TAKE 1 BY MOUTH DAILY. DO NOT SUBSTITUTE 12/04/14   [provider]  VENTOLIN HFA 108 (90 Base) MCG/ACT inhaler INHALE 2 PUFFS EVERY 6 HOURS AS NEEDED FOR SHORTNESS OF BREATH 01/30/18   Wilhelmina Mcardle, MD    Allergies Molds & smuts and Penicillins  Family History  Problem Relation Age of Onset  . Stroke Mother   . Breast cancer Sister 49       breast ca x3  . Colon cancer Paternal Aunt   . Cancer Sister     Social History Social History   Tobacco Use  . Smoking status: Never Smoker  . Smokeless tobacco: Never Used  Substance Use Topics  . Alcohol use: No    Alcohol/week: 0.0 standard drinks  . Drug use: No    Review of Systems Constitutional: No fever/chills Eyes: No visual changes. ENT: No sore throat. Cardiovascular: Positive for chest pressure and palpitations. Respiratory: Denies shortness of breath. Gastrointestinal: No abdominal pain.  No nausea, no vomiting.  No diarrhea.   Genitourinary: Negative for dysuria. Musculoskeletal: Negative for back pain. Skin: Negative for rash. Neurological: Negative for headaches, focal weakness or numbness.  ____________________________________________   PHYSICAL EXAM:  VITAL SIGNS: ED Triage Vitals  Enc Vitals Group     BP 05/11/19 1452 (!) 170/118     Pulse Rate 05/11/19 1452 (!) 170     Resp 05/11/19 1452 20     Temp 05/11/19 1452 98.2 F (36.8 C)     Temp Source 05/11/19 1452 Oral     SpO2 05/11/19 1452 96 %     Weight 05/11/19 1453 180 lb (81.6 kg)     Height 05/11/19 1453 5\' 7"  (1.702 m)     Head Circumference --      Peak Flow --      Pain Score 05/11/19 1451 5   Constitutional: Alert and  oriented.  Eyes: Conjunctivae are normal.  ENT      Head: Normocephalic and atraumatic.      Nose: No congestion/rhinnorhea.      Mouth/Throat: Mucous membranes are moist.      Neck: No stridor. Hematological/Lymphatic/Immunilogical: No cervical lymphadenopathy. Cardiovascular: Tachycardia, irregularly irregular rhythm. No murmurs or gallops appreciated.  Respiratory: Normal respiratory effort without tachypnea nor retractions. Breath sounds are clear and equal bilaterally. No wheezes/rales/rhonchi. Gastrointestinal: Soft and non tender. No rebound. No guarding.  Genitourinary: Deferred Musculoskeletal: Normal range of motion in all extremities. No lower extremity edema. Neurologic:  Normal speech and language. No gross focal neurologic deficits are appreciated.  Skin:  Skin is warm, dry and intact. No rash noted. Psychiatric: Mood and affect are  normal. Speech and behavior are normal. Patient exhibits appropriate insight and judgment.  ____________________________________________    LABS (pertinent positives/negatives)  CBC wbc 7.0, hgb 15.1, plt 255 Trop 16 BMP wnl except glu 218, cr 1.08 ____________________________________________   EKG  I, Nance Pear, attending physician, personally viewed and interpreted this EKG  EKG Time: 1449 Rate: 169 Rhythm: atrial fibrillation with RVR Axis: left axis deviation Intervals: qtc 479 QRS: LAFB ST changes: no st elevation Impression: abnormal ekg  ____________________________________________    RADIOLOGY  None  ____________________________________________   PROCEDURES  Procedures  ____________________________________________   INITIAL IMPRESSION / ASSESSMENT AND PLAN / ED COURSE  Pertinent labs & imaging results that were available during my care of the patient were reviewed by me and considered in my medical decision making (see chart for details).   Patient presented to the emergency department today because  of concern for palpitations and chest pressure. Has had episodes over the previous 8 months. Initial EKG was consistent with atrial fibrillation with RVR. It does not appear that the patient has been found to have this in the past. Patient was given an initial dose of diltiazem which did help bring her rate down. It was starting to come back up however before second dose could be given she converted back to a sinus rhythm. Dr. Clayborn Bigness with cardiology was consulted and did see the patient. At this point feels comfortable with her being discharged home. Will increase metoprolol and aspirin to full dose. Patient will follow up with cardiology in clinic. Discussed plan and return precautions with the patient.   ____________________________________________   FINAL CLINICAL IMPRESSION(S) / ED DIAGNOSES  Final diagnoses:  Atrial fibrillation with RVR ([place])     Note: This dictation was prepared with Dragon dictation. Any transcriptional errors that result from this process are unintentional     Nance Pear, MD 05/11/19 1747

## 2019-05-11 NOTE — ED Notes (Signed)
Pt verbalized understanding of discharge instructions. NAD at this time. 

## 2019-05-11 NOTE — Discharge Instructions (Signed)
Please seek medical attention for any high fevers, chest pain, shortness of breath, change in behavior, persistent vomiting, bloody stool or any other new or concerning symptoms.  

## 2019-05-11 NOTE — ED Triage Notes (Addendum)
Pt arrived via POV with reports of feeling like her heart is racing. Pt states this has been going on off and on x 8 months, pt has been seen by cardiology and is on heart medication.  Pt states today around 1pm she noticed her heart was racing, along with chest discomfort. Pt describes the pain as someone stepping on it.   Pt reports left sided chest pain as well that radiated to her jaw. Pt denies N/V. Reports shortness of breath.

## 2019-05-17 DIAGNOSIS — I10 Essential (primary) hypertension: Secondary | ICD-10-CM | POA: Diagnosis not present

## 2019-05-17 DIAGNOSIS — R0602 Shortness of breath: Secondary | ICD-10-CM | POA: Diagnosis not present

## 2019-05-17 DIAGNOSIS — R000 Tachycardia, unspecified: Secondary | ICD-10-CM | POA: Diagnosis not present

## 2019-05-17 DIAGNOSIS — R002 Palpitations: Secondary | ICD-10-CM | POA: Diagnosis not present

## 2019-05-17 DIAGNOSIS — I480 Paroxysmal atrial fibrillation: Secondary | ICD-10-CM | POA: Diagnosis not present

## 2019-06-26 DIAGNOSIS — I071 Rheumatic tricuspid insufficiency: Secondary | ICD-10-CM | POA: Insufficient documentation

## 2019-06-26 DIAGNOSIS — I340 Nonrheumatic mitral (valve) insufficiency: Secondary | ICD-10-CM | POA: Insufficient documentation

## 2019-06-26 DIAGNOSIS — I5189 Other ill-defined heart diseases: Secondary | ICD-10-CM | POA: Insufficient documentation

## 2019-06-26 DIAGNOSIS — N179 Acute kidney failure, unspecified: Secondary | ICD-10-CM | POA: Insufficient documentation

## 2019-06-29 MED ORDER — DIHYDROTACHYSTEROL 0.125 MG PO TABS
10.00 | ORAL_TABLET | ORAL | Status: DC
Start: 2019-06-29 — End: 2019-06-29

## 2019-06-29 MED ORDER — TUBERCULIN SYRINGE 27G X 1/2" 1 ML MISC
Status: DC
Start: 2019-06-30 — End: 2019-06-29

## 2019-06-29 MED ORDER — OLANZAPINE-FLUOXETINE HCL 6-50 MG PO CAPS
3.00 | ORAL_CAPSULE | ORAL | Status: DC
Start: ? — End: 2019-06-29

## 2019-06-29 MED ORDER — BAYER WOMENS 81-300 MG PO TABS
40.00 | ORAL_TABLET | ORAL | Status: DC
Start: 2019-06-30 — End: 2019-06-29

## 2019-06-29 MED ORDER — QUINERVA 260 MG PO TABS
650.00 | ORAL_TABLET | ORAL | Status: DC
Start: ? — End: 2019-06-29

## 2019-06-29 MED ORDER — PENTOXIFYLLINE POWD
50.00 | Status: DC
Start: 2019-06-30 — End: 2019-06-29

## 2019-06-29 MED ORDER — WEIGHT LOSS DAILY MULTI PO TABS
5.00 | ORAL_TABLET | ORAL | Status: DC
Start: 2019-06-29 — End: 2019-06-29

## 2019-06-29 MED ORDER — PHENYLEPHRINE-GUAIFENESIN 30-400 MG PO CP12
5.00 | ORAL_CAPSULE | ORAL | Status: DC
Start: 2019-06-30 — End: 2019-06-29

## 2019-06-29 MED ORDER — CVS BENZOYL PEROXIDE 10 % EX LOTN
120.00 | TOPICAL_LOTION | CUTANEOUS | Status: DC
Start: 2019-06-29 — End: 2019-06-29

## 2019-06-29 MED ORDER — Medication
2.00 | Status: DC
Start: ? — End: 2019-06-29

## 2019-06-29 MED ORDER — Medication
Status: DC
Start: ? — End: 2019-06-29

## 2019-06-29 MED ORDER — ALBUTEROL SULFATE HFA 108 (90 BASE) MCG/ACT IN AERS
1.00 | INHALATION_SPRAY | RESPIRATORY_TRACT | Status: DC
Start: ? — End: 2019-06-29

## 2019-06-29 MED ORDER — DURACLON EP
2000.00 | EPIDURAL | Status: DC
Start: 2019-06-30 — End: 2019-06-29

## 2019-06-29 MED ORDER — TUSSI PRES-B 2-15-200 MG/5ML PO LIQD
0.50 | ORAL | Status: DC
Start: ? — End: 2019-06-29

## 2019-06-29 MED ORDER — PREPARATION H TOTABLES 1-0.25-14.4-15 % RE CREA
2.00 | TOPICAL_CREAM | RECTAL | Status: DC
Start: 2019-06-29 — End: 2019-06-29

## 2019-07-02 ENCOUNTER — Telehealth: Payer: Self-pay

## 2019-07-02 DIAGNOSIS — R053 Chronic cough: Secondary | ICD-10-CM

## 2019-07-02 DIAGNOSIS — R05 Cough: Secondary | ICD-10-CM

## 2019-07-02 DIAGNOSIS — J4530 Mild persistent asthma, uncomplicated: Secondary | ICD-10-CM

## 2019-07-02 DIAGNOSIS — Z1231 Encounter for screening mammogram for malignant neoplasm of breast: Secondary | ICD-10-CM

## 2019-07-02 NOTE — Telephone Encounter (Signed)
Copied from [place] [id number]. Topic: Referral - Request for Referral >> Jul 02, 2019  3:54 PM Erick Blinks wrote: Has patient seen PCP for this complaint? Yes  *If NO, is insurance requiring patient see PCP for this issue before PCP can refer them? Referral for which specialty: Mammogram  Preferred provider/office: [facility]  Reason for referral:

## 2019-07-03 ENCOUNTER — Ambulatory Visit (INDEPENDENT_AMBULATORY_CARE_PROVIDER_SITE_OTHER): Payer: Medicare Other | Admitting: Family Medicine

## 2019-07-03 ENCOUNTER — Other Ambulatory Visit: Payer: Self-pay

## 2019-07-03 ENCOUNTER — Encounter: Payer: Self-pay | Admitting: Family Medicine

## 2019-07-03 DIAGNOSIS — J45901 Unspecified asthma with (acute) exacerbation: Secondary | ICD-10-CM | POA: Diagnosis not present

## 2019-07-03 DIAGNOSIS — R058 Other specified cough: Secondary | ICD-10-CM

## 2019-07-03 DIAGNOSIS — R05 Cough: Secondary | ICD-10-CM

## 2019-07-03 MED ORDER — MONTELUKAST SODIUM 10 MG PO TABS: 10.0000 mg | ORAL_TABLET | Freq: Every day | ORAL | 2 refills | Status: DC

## 2019-07-03 MED ORDER — LEVOCETIRIZINE DIHYDROCHLORIDE 5 MG PO TABS: 5.0000 mg | ORAL_TABLET | Freq: Every evening | ORAL | 2 refills | Status: DC

## 2019-07-03 NOTE — Progress Notes (Signed)
Name: Sonya Harper   MRN: [id number]    DOB: [birth year]   Date:07/03/2019       Progress Note  Subjective:    Chief Complaint  Chief Complaint  Patient presents with  . Cough    comes and goes and worse at night    I connected with  [name] on 07/03/19 at  3:00 PM EDT by telephone and verified that I am speaking with the correct person using two identifiers.   I discussed the limitations, risks, security and privacy concerns of performing an evaluation and management service by telephone and the availability of in person appointments. Staff also discussed with the patient that there may be a patient responsible charge related to this service. Patient Location: home Provider Location: [facility] clinic Additional Individuals present: none  Pt presents for cough that is worse at night.  Dry cough.  Pt reports that she has "a touch of asthma".  She says that she's had this for about 7 years and certain times of year it is worse.  She is using her inhalers "more frequently" right now, "using them more" She is on Breo ellipta one puff per day, and symbicort was started in the hospital last week when she was admitted for afib.  She does have SABA but it does not seem to help her with night time cough.  She states the cough is nonproductive, she has no chest pain, shortness of breath, occasionally gets where she feels she is choked or is coughing fits, not having wheeze or exertional shortness of breath during the day.  Patient did have a visit in September where she was given a steroid taper while she was on that she had complete improvement of her nocturnal cough and after stopping the steroids did return.  She does have seasonal allergies but is only using Flonase is not on any other antihistamines or allergy medications. She also has GERD, taking protonix, feels reflux is well controlled right now.  When she was just in the hospital she had a problem with gas and BMs normal.     Patient Active Problem List   Diagnosis Date Noted  . Lumbar facet arthropathy 11/06/2018  . Prediabetes 02/27/2018  . Medication monitoring encounter 11/13/2017  . Abnormal laboratory test 10/16/2017  . Breast pain in female 08/10/2017  . Obesity (BMI 30.0-34.9) 08/10/2017  . Protein in urine 08/10/2017  . Vaginal atrophy 08/10/2017  . Benign essential HTN 11/16/2016  . Hyperlipidemia, mixed 11/16/2016  . SOBOE (shortness of breath on exertion) 11/16/2016  . Stable angina pectoris ([place]) 11/16/2016  . Dysrhythmia 07/07/2016  . Right low back pain 07/07/2016  . Fall on or from stairs or steps 07/07/2016  . Family history of malignant neoplasm of gastrointestinal tract   . Benign neoplasm of ascending colon   . Benign neoplasm of transverse colon   . Benign neoplasm of descending colon   . Goiter 03/03/2015  . Asthma, mild persistent 03/03/2015  . Gastroesophageal reflux disease without esophagitis 03/03/2015    Social History   Tobacco Use  . Smoking status: Never Smoker  . Smokeless tobacco: Never Used  Substance Use Topics  . Alcohol use: No    Alcohol/week: 0.0 standard drinks     Current Outpatient Medications:  .  amLODipine (NORVASC) 5 MG tablet, Take by mouth., Disp: , Rfl:  .  apixaban (ELIQUIS) 5 MG TABS tablet, Take by mouth., Disp: , Rfl:  .  atorvastatin (LIPITOR) 10 MG tablet,  Take 1 tablet (10 mg total) by mouth every other day., Disp: 45 tablet, Rfl: 1 .  BREO ELLIPTA 100-25 MCG/INH AEPB, INHALE ONE DOSE BY MOUTH DAILY, Disp: 3 each, Rfl: 1 .  Cholecalciferol (VITAMIN D-1000 MAX ST) 25 MCG (1000 UT) tablet, Take by mouth., Disp: , Rfl:  .  EPINEPHrine (EPIPEN 2-PAK) 0.3 mg/0.3 mL IJ SOAJ injection, Use per package directions; one injection subcutaneously for LIFE-THREATENING emergency, deadly reaction, Disp: 2 Device, Rfl: 1 .  fluticasone (FLONASE) 50 MCG/ACT nasal spray, Place 2 sprays into both nostrils daily., Disp: 16 g, Rfl: 6 .  nitroGLYCERIN  (NITROSTAT) 0.4 MG SL tablet, Place 1 tablet (0.4 mg total) under the tongue every 5 (five) minutes as needed for chest pain. Max 3 pills per episode, Disp: 50 tablet, Rfl: 3 .  pantoprazole (PROTONIX) 40 MG tablet, Take 1 tablet (40 mg total) by mouth daily., Disp: 90 tablet, Rfl: 0 .  sotalol (BETAPACE) 120 MG tablet, Take by mouth., Disp: , Rfl:  .  SYNTHROID 50 MCG tablet, TAKE 1 BY MOUTH DAILY. DO NOT SUBSTITUTE, Disp: , Rfl: 0 .  VENTOLIN HFA 108 (90 Base) MCG/ACT inhaler, INHALE 2 PUFFS EVERY 6 HOURS AS NEEDED FOR SHORTNESS OF BREATH, Disp: 18 each, Rfl: 2  Allergies  Allergen Reactions  . Molds & Smuts Anaphylaxis  . Penicillins Nausea And Vomiting and Rash    Has patient had a PCN reaction causing immediate rash, facial/tongue/throat swelling, SOB or lightheadedness with hypotension: Yes Has patient had a PCN reaction causing severe rash involving mucus membranes or skin necrosis: No Has patient had a PCN reaction that required hospitalization: No Has patient had a PCN reaction occurring within the last 10 years: No If all of the above answers are "NO", then may proceed with Cephalosporin use.     I personally reviewed active problem list, medication list, allergies, family history, social history, health maintenance, notes from last encounter, lab results with the patient/caregiver today.  Review of Systems  Constitutional: Negative.  Negative for activity change, appetite change, chills, diaphoresis, fatigue, fever and unexpected weight change.  HENT: Negative.   Eyes: Negative.   Respiratory: Negative.  Negative for apnea and stridor.   Cardiovascular: Negative.  Negative for chest pain and leg swelling.  Gastrointestinal: Negative.   Endocrine: Negative.   Genitourinary: Negative.   Musculoskeletal: Negative.   Skin: Negative.  Negative for color change, pallor and rash.  Allergic/Immunologic: Negative.   Neurological: Negative.   Hematological: Negative.    Psychiatric/Behavioral: Negative.   All other systems reviewed and are negative.     Objective:    Virtual encounter, vitals limited, only able to obtain the following There were no vitals filed for this visit. There is no height or weight on file to calculate BMI. Nursing Note and Vital Signs reviewed.  Physical Exam Vitals signs and nursing note reviewed.  Pulmonary:     Effort: No respiratory distress.     Comments: Rare coughing while on telephone encounter, no audible wheeze or stridor, patient able to speak in full and complete sentences Neurological:     Mental Status: She is alert.    PE limited by telephone encounter  No results found for this or any previous visit (from the past 72 hour(s)).  Assessment and Plan:     ICD-10-CM   1. Nocturnal cough  R05 montelukast (SINGULAIR) 10 MG tablet   increase asthma and allergy meds, possibly worsening reactive airway and post nasal drip, since cough primarily  at night time,GERD well controlled right now  2. Persistent asthma with acute exacerbation, unspecified asthma severity  J45.901 montelukast (SINGULAIR) 10 MG tablet  Pt does not want to take steroids again, but when she was on prednisone last month she stopped coughing. Tx with SABA prn, start singulair and a second generation antihistamine - GERD seems well controlled, f/up if med changes do not help with nighttime coughing, will want pt to f/up in office.   -Red flags and when to present for emergency care or RTC including fever >101.13F, chest pain, shortness of breath, new/worsening/un-resolving symptoms,  reviewed with patient at time of visit. Follow up and care instructions discussed and provided in AVS. - I discussed the assessment and treatment plan with the patient. The patient was provided an opportunity to ask questions and all were answered. The patient agreed with the plan and demonstrated an understanding of the instructions.  - The patient was advised to  call back or seek an in-person evaluation if the symptoms worsen or if the condition fails to improve as anticipated.  I provided 13 minutes of non-face-to-face time during this encounter.  Delsa Grana, PA-C 07/03/19 3:29 PM

## 2019-07-03 NOTE — Telephone Encounter (Signed)
Mammogram order and dx entered

## 2019-07-05 ENCOUNTER — Ambulatory Visit
Admission: RE | Admit: 2019-07-05 | Discharge: 2019-07-05 | Disposition: A | Discharge status: Home / Self Care | Payer: Medicare Other | Source: Ambulatory Visit | Attending: Family Medicine | Admitting: Family Medicine

## 2019-07-05 DIAGNOSIS — Z1231 Encounter for screening mammogram for malignant neoplasm of breast: Secondary | ICD-10-CM | POA: Insufficient documentation

## 2019-07-12 ENCOUNTER — Ambulatory Visit: Payer: Medicare Other | Admitting: Family Medicine

## 2019-08-05 ENCOUNTER — Other Ambulatory Visit: Payer: Self-pay

## 2019-08-05 DIAGNOSIS — K2100 Gastro-esophageal reflux disease with esophagitis, without bleeding: Secondary | ICD-10-CM

## 2019-08-05 DIAGNOSIS — R05 Cough: Secondary | ICD-10-CM

## 2019-08-05 DIAGNOSIS — R053 Chronic cough: Secondary | ICD-10-CM

## 2019-08-05 MED ORDER — PANTOPRAZOLE SODIUM 40 MG PO TBEC: 40.0000 mg | DELAYED_RELEASE_TABLET | Freq: Every day | ORAL | 0 refills | Status: DC

## 2019-08-26 ENCOUNTER — Other Ambulatory Visit: Payer: Self-pay

## 2019-08-26 ENCOUNTER — Telehealth: Payer: Self-pay

## 2019-08-26 ENCOUNTER — Encounter: Payer: Self-pay | Admitting: Emergency Medicine

## 2019-08-26 DIAGNOSIS — R1030 Lower abdominal pain, unspecified: Secondary | ICD-10-CM | POA: Diagnosis not present

## 2019-08-26 DIAGNOSIS — Z79899 Other long term (current) drug therapy: Secondary | ICD-10-CM | POA: Diagnosis not present

## 2019-08-26 DIAGNOSIS — E039 Hypothyroidism, unspecified: Secondary | ICD-10-CM | POA: Diagnosis not present

## 2019-08-26 DIAGNOSIS — J45909 Unspecified asthma, uncomplicated: Secondary | ICD-10-CM | POA: Diagnosis not present

## 2019-08-26 DIAGNOSIS — Z1211 Encounter for screening for malignant neoplasm of colon: Secondary | ICD-10-CM

## 2019-08-26 DIAGNOSIS — Z7901 Long term (current) use of anticoagulants: Secondary | ICD-10-CM | POA: Diagnosis not present

## 2019-08-26 NOTE — ED Triage Notes (Signed)
Patient ambulatory to triage with steady gait, without difficulty or distress noted, mask in place; pt reports lower abd pain radiating into back x 10 days with no accomp symptoms

## 2019-08-26 NOTE — Telephone Encounter (Signed)
Copied from [place] [phone]. Topic: Referral - Request for Referral >> Aug 26, 2019  8:51 AM Lennox Solders wrote: Reason for CRM: pt need an referral to see dr Allen Norris  Has patient seen PCP for this complaint? Yes . Pt is due for colonoscopy. Pt missed colonoscopy in sept 2020 and per dr Allen Norris office she needs a referral. [facility] insurance    Please submit new referral to [facility] for colon cancer screening

## 2019-08-26 NOTE — Telephone Encounter (Signed)
New referral is placed

## 2019-08-27 ENCOUNTER — Emergency Department: Payer: Medicare Other

## 2019-08-27 ENCOUNTER — Encounter: Payer: Self-pay | Admitting: Radiology

## 2019-08-27 ENCOUNTER — Emergency Department
Admission: EM | Admit: 2019-08-27 | Discharge: 2019-08-27 | Disposition: A | Discharge status: Home / Self Care | Payer: Medicare Other | Attending: Emergency Medicine | Admitting: Emergency Medicine

## 2019-08-27 ENCOUNTER — Telehealth: Payer: Self-pay | Admitting: Gastroenterology

## 2019-08-27 ENCOUNTER — Ambulatory Visit: Payer: Medicare Other | Admitting: Family Medicine

## 2019-08-27 DIAGNOSIS — R1030 Lower abdominal pain, unspecified: Secondary | ICD-10-CM | POA: Diagnosis not present

## 2019-08-27 DIAGNOSIS — R109 Unspecified abdominal pain: Secondary | ICD-10-CM

## 2019-08-27 LAB — COMPREHENSIVE METABOLIC PANEL
ALT: 12 U/L (ref 0–44)
AST: 18 U/L (ref 15–41)
Albumin: 4 g/dL (ref 3.5–5.0)
Alkaline Phosphatase: 94 U/L (ref 38–126)
Anion gap: 7 (ref 5–15)
BUN: 16 mg/dL (ref 8–23)
CO2: 25 mmol/L (ref 22–32)
Calcium: 9.1 mg/dL (ref 8.9–10.3)
Chloride: 106 mmol/L (ref 98–111)
Creatinine, Ser: 0.89 mg/dL (ref 0.44–1.00)
GFR calc Af Amer: >60 mL/min (ref >60)
GFR calc non Af Amer: >60 mL/min (ref >60)
Glucose, Bld: 106 mg/dL — ABNORMAL HIGH (ref 70–99)
Potassium: 3.9 mmol/L (ref 3.5–5.1)
Sodium: 138 mmol/L (ref 135–145)
Total Bilirubin: 0.5 mg/dL (ref 0.3–1.2)
Total Protein: 7 g/dL (ref 6.5–8.1)

## 2019-08-27 LAB — URINALYSIS, COMPLETE (UACMP) WITH MICROSCOPIC
Bacteria, UA: NONE SEEN
Bilirubin Urine: NEGATIVE
Glucose, UA: NEGATIVE mg/dL
Ketones, ur: NEGATIVE mg/dL
Leukocytes,Ua: NEGATIVE
Nitrite: NEGATIVE
Protein, ur: NEGATIVE mg/dL
Specific Gravity, Urine: 1.01 (ref 1.005–1.030)
pH: 6 (ref 5.0–8.0)

## 2019-08-27 LAB — CBC WITH DIFFERENTIAL/PLATELET
Abs Immature Granulocytes: 0.02 10*3/uL (ref 0.00–0.07)
Basophils Absolute: 0 10*3/uL (ref 0.0–0.1)
Basophils Relative: 0 %
Eosinophils Absolute: 0.1 10*3/uL (ref 0.0–0.5)
Eosinophils Relative: 2 %
HCT: 41.1 % (ref 36.0–46.0)
Hemoglobin: 13.5 g/dL (ref 12.0–15.0)
Immature Granulocytes: 0 %
Lymphocytes Relative: 46 %
Lymphs Abs: 3.3 10*3/uL (ref 0.7–4.0)
MCH: 28.4 pg (ref 26.0–34.0)
MCHC: 32.8 g/dL (ref 30.0–36.0)
MCV: 86.5 fL (ref 80.0–100.0)
Monocytes Absolute: 0.5 10*3/uL (ref 0.1–1.0)
Monocytes Relative: 7 %
Neutro Abs: 3.3 10*3/uL (ref 1.7–7.7)
Neutrophils Relative %: 45 %
Platelets: 226 10*3/uL (ref 150–400)
RBC: 4.75 MIL/uL (ref 3.87–5.11)
RDW: 14 % (ref 11.5–15.5)
WBC: 7.4 10*3/uL (ref 4.0–10.5)
nRBC: 0 % (ref 0.0–0.2)

## 2019-08-27 LAB — LIPASE, BLOOD: Lipase: 34 U/L (ref 11–51)

## 2019-08-27 MED ORDER — IOHEXOL 300 MG/ML  SOLN
100.0000 mL | Freq: Once | INTRAMUSCULAR | Status: AC | PRN
  Administered 2019-08-27: 100 mL via INTRAVENOUS

## 2019-08-27 MED ORDER — SUCRALFATE 1 G PO TABS: 1.0000 g | ORAL_TABLET | Freq: Four times a day (QID) | ORAL | 1 refills | Status: DC | PRN

## 2019-08-27 MED ORDER — IOHEXOL 9 MG/ML PO SOLN: 500.0000 mL | Freq: Two times a day (BID) | ORAL | Status: DC | PRN

## 2019-08-27 MED ORDER — ONDANSETRON 4 MG PO TBDP: ORAL_TABLET | ORAL | 0 refills | Status: DC

## 2019-08-27 NOTE — Telephone Encounter (Signed)
Pt husband is calling  To schedule colonoscopy  He missed a call earlier today

## 2019-08-27 NOTE — ED Provider Notes (Signed)
[facility] Emergency Department Provider Note  ____________________________________________   First MD Initiated Contact with Patient 08/27/19 [phone]     (approximate)  I have reviewed the triage vital signs and the nursing notes.   HISTORY  Chief Complaint Abdominal Pain    HPI AEMILIA Harper is a 73 y.o. female with medical history as listed below which notably includes paroxysmal A. fib on Eliquis as well as the other issues as listed below.  She says that she has been told before that she has diverticulosis but she has never had to take antibiotics.  She presents tonight for evaluation of more than a week of pain in her lower abdomen which is gradually getting worse.  She says it is an aching pain that is sometimes sharp and sometimes radiates around to the left side of her back.  Nothing in particular makes it better or worse.  She had some nausea today when the pain was the most severe but she has not had any vomiting.  She denies diarrhea and constipation and has had normal bowel habits.  She denies any COVID-19 contact.  She denies fever/chills, sore throat, chest pain, shortness of breath, cough.  She has not had similar problems in the past and has recently been treated for a possible urinary tract infection due to increased urinary frequency and some hematuria.  She took Macrobid for this and I saw the prescription in her back.        Past Medical History:  Diagnosis Date  . A-fib ([place])   . Allergy   . Arthritis    feet  . Asthma   . Breast neoplasm   . Chronic lower back pain    s/p fall injury  . Depression   . Dysrhythmia   . Fibromyalgia   . Hypertension   . Hypothyroidism   . Lumbar facet arthropathy 11/06/2018  . Prediabetes 02/27/2018  . Sciatica of right side   . Wears dentures    partial upper and lower    Patient Active Problem List   Diagnosis Date Noted  . Lumbar facet arthropathy 11/06/2018  . Prediabetes 02/27/2018  .  Medication monitoring encounter 11/13/2017  . Abnormal laboratory test 10/16/2017  . Breast pain in female 08/10/2017  . Obesity (BMI 30.0-34.9) 08/10/2017  . Protein in urine 08/10/2017  . Vaginal atrophy 08/10/2017  . Benign essential HTN 11/16/2016  . Hyperlipidemia, mixed 11/16/2016  . SOBOE (shortness of breath on exertion) 11/16/2016  . Stable angina pectoris ([place]) 11/16/2016  . Dysrhythmia 07/07/2016  . Right low back pain 07/07/2016  . Fall on or from stairs or steps 07/07/2016  . Family history of malignant neoplasm of gastrointestinal tract   . Benign neoplasm of ascending colon   . Benign neoplasm of transverse colon   . Benign neoplasm of descending colon   . Goiter 03/03/2015  . Asthma, mild persistent 03/03/2015  . Gastroesophageal reflux disease without esophagitis 03/03/2015    Past Surgical History:  Procedure Laterality Date  . ABDOMINAL HYSTERECTOMY    . BREAST SURGERY    . COLONOSCOPY WITH PROPOFOL N/A 05/13/2016   Procedure: COLONOSCOPY WITH PROPOFOL;  Surgeon: Lucilla Lame, MD;  Location: [facility];  Service: Endoscopy;  Laterality: N/A;  . POLYPECTOMY N/A 05/13/2016   Procedure: POLYPECTOMY;  Surgeon: Lucilla Lame, MD;  Location: [place];  Service: Endoscopy;  Laterality: N/A;    Prior to Admission medications   Medication Sig Start Date End Date Taking? Authorizing  Provider  amLODipine (NORVASC) 5 MG tablet Take by mouth. 06/30/19 06/29/20  [provider]  apixaban (ELIQUIS) 5 MG TABS tablet Take by mouth. 06/07/19 09/05/19  [provider]  atorvastatin (LIPITOR) 10 MG tablet Take 1 tablet (10 mg total) by mouth every other day. 12/29/18   Lada, Satira Anis, MD  BREO ELLIPTA 100-25 MCG/INH AEPB INHALE ONE DOSE BY MOUTH DAILY 12/12/18   Lada, Satira Anis, MD  Cholecalciferol (VITAMIN D-1000 MAX ST) 25 MCG (1000 UT) tablet Take by mouth.    [provider]  EPINEPHrine (EPIPEN 2-PAK) 0.3 mg/0.3 mL IJ SOAJ injection Use  per package directions; one injection subcutaneously for LIFE-THREATENING emergency, deadly reaction 05/16/18   Lada, Satira Anis, MD  fluticasone (FLONASE) 50 MCG/ACT nasal spray Place 2 sprays into both nostrils daily. 12/11/18   Poulose, Bethel Born, NP  levocetirizine (XYZAL) 5 MG tablet Take 1 tablet (5 mg total) by mouth every evening. 07/03/19   Delsa Grana, PA-C  montelukast (SINGULAIR) 10 MG tablet Take 1 tablet (10 mg total) by mouth at bedtime. 07/03/19 08/02/19  Delsa Grana, PA-C  nitroGLYCERIN (NITROSTAT) 0.4 MG SL tablet Place 1 tablet (0.4 mg total) under the tongue every 5 (five) minutes as needed for chest pain. Max 3 pills per episode 08/30/18   Arnetha Courser, MD  ondansetron (ZOFRAN ODT) 4 MG disintegrating tablet Allow 1-2 tablets to dissolve in your mouth every 8 hours as needed for nausea/vomiting 08/27/19   Hinda Kehr, MD  pantoprazole (PROTONIX) 40 MG tablet Take 1 tablet (40 mg total) by mouth daily. 08/05/19   Delsa Grana, PA-C  sotalol (BETAPACE) 120 MG tablet Take by mouth. 06/28/19 06/27/20  [provider]  sucralfate (CARAFATE) 1 g tablet Take 1 tablet (1 g total) by mouth 4 (four) times daily as needed (for abdominal discomfort, nausea, and/or vomiting). 08/27/19   Hinda Kehr, MD  SYNTHROID 50 MCG tablet TAKE 1 BY MOUTH DAILY. DO NOT SUBSTITUTE 12/04/14   [provider]  VENTOLIN HFA 108 (90 Base) MCG/ACT inhaler INHALE 2 PUFFS EVERY 6 HOURS AS NEEDED FOR SHORTNESS OF BREATH 01/30/18   Wilhelmina Mcardle, MD    Allergies Molds & smuts and Penicillins  Family History  Problem Relation Age of Onset  . Stroke Mother   . Breast cancer Sister 11       breast ca x3  . Colon cancer Paternal Aunt   . Cancer Sister     Social History Social History   Tobacco Use  . Smoking status: Never Smoker  . Smokeless tobacco: Never Used  Substance Use Topics  . Alcohol use: No    Alcohol/week: 0.0 standard drinks  . Drug use: No    Review of  Systems Constitutional: No fever/chills Eyes: No visual changes. ENT: No sore throat. Cardiovascular: Denies chest pain. Respiratory: Denies shortness of breath. Gastrointestinal: Lower abdominal pain with nausea as described above. Genitourinary: Negative for dysuria.  Recently treated for UTI. Musculoskeletal: Negative for neck pain.  Negative for back pain except for some pain radiating around from the abdomen. Integumentary: Negative for rash. Neurological: Negative for headaches, focal weakness or numbness.   ____________________________________________   PHYSICAL EXAM:  VITAL SIGNS: ED Triage Vitals  Enc Vitals Group     BP 08/26/19 2351 (!) 158/83     Pulse Rate 08/26/19 2351 (!) 52     Resp 08/26/19 2351 18     Temp 08/26/19 2351 98.1 F (36.7 C)     Temp  Source 08/26/19 2351 Oral     SpO2 08/26/19 2351 97 %     Weight 08/26/19 2349 83.5 kg (184 lb)     Height 08/26/19 2349 1.676 m (5\' 6" )     Head Circumference --      Peak Flow --      Pain Score 08/26/19 2349 7     Pain Loc --      Pain Edu? --      Excl. in [place]? --     Constitutional: Alert and oriented.  Generally well-appearing, currently not in acute distress. Eyes: Conjunctivae are normal.  Head: Atraumatic. Nose: No congestion/rhinnorhea. Mouth/Throat: Patient is wearing a mask. Neck: No stridor.  No meningeal signs.   Cardiovascular: Normal rate, regular rhythm. Good peripheral circulation. Grossly normal heart sounds. Respiratory: Normal respiratory effort.  No retractions. Gastrointestinal: Soft and nondistended.  She has diffuse tenderness to palpation all throughout the abdomen but more exquisite tenderness in the left side of the lower abdomen.  No rebound and no guarding. Musculoskeletal: No CVA tenderness to percussion.  No lower extremity tenderness nor edema. No gross deformities of extremities. Neurologic:  Normal speech and language. No gross focal neurologic deficits are appreciated.    Skin:  Skin is warm, dry and intact. Psychiatric: Mood and affect are normal. Speech and behavior are normal.  ____________________________________________   LABS (all labs ordered are listed, but only abnormal results are displayed)  Labs Reviewed  COMPREHENSIVE METABOLIC PANEL - Abnormal; Notable for the following components:      Result Value   Glucose, Bld 106 (*)    All other components within normal limits  URINALYSIS, COMPLETE (UACMP) WITH MICROSCOPIC - Abnormal; Notable for the following components:   Color, Urine YELLOW (*)    APPearance CLEAR (*)    Hgb urine dipstick SMALL (*)    All other components within normal limits  CBC WITH DIFFERENTIAL/PLATELET  LIPASE, BLOOD   ____________________________________________  EKG  No indication for emergent EKG ____________________________________________  RADIOLOGY Ursula Alert, personally viewed and evaluated these images (plain radiographs) as part of my medical decision making, as well as reviewing the written report by the radiologist.  ED MD interpretation:  Possible mild gastritis, no other acute abnormalities  Official radiology report(s): CT abd/pelvis w/ IV contrast  Result Date: 08/27/2019 CLINICAL DATA:  Nausea vomiting lower abdominal pain EXAM: CT ABDOMEN AND PELVIS WITH CONTRAST TECHNIQUE: Multidetector CT imaging of the abdomen and pelvis was performed using the standard protocol following bolus administration of intravenous contrast. CONTRAST:  154mL OMNIPAQUE IOHEXOL 300 MG/ML  SOLN COMPARISON:  February 14, 2010 FINDINGS: Lower chest: The visualized heart size within normal limits. No pericardial fluid/thickening. No hiatal hernia. The visualized portions of the lungs are clear. Hepatobiliary: Tiny 1 cm low-density lesion in the posterior right liver lobe as on prior exam, likely hepatic cyst. The main portal vein is patent. No evidence of calcified gallstones, gallbladder wall thickening or biliary dilatation.  Pancreas: Unremarkable. No pancreatic ductal dilatation or surrounding inflammatory changes. Spleen: Normal in size without focal abnormality. Adrenals/Urinary Tract: Both adrenal glands appear normal. There is a 3 cm low-density lesion seen off the lower pole the left kidney, likely renal cyst. There is a 1 cm low-density lesion seen in the lower pole the right kidney. No hydronephrosis. Bladder is unremarkable. Stomach/Bowel: There is mild circumferential wall thickening seen within the distal stomach/pylorus. No significant surrounding fat stranding changes however are seen. No inflammatory changes, wall thickening, or obstructive findings.The  appendix is normal. Vascular/Lymphatic: There are no enlarged mesenteric, retroperitoneal, or pelvic lymph nodes. No significant vascular findings are present. Reproductive: The patient is status post hysterectomy. Other: No evidence of abdominal wall mass or hernia. Musculoskeletal: No acute or significant osseous findings. IMPRESSION: Distal stomach /pylorus wall thickening which could be due to gastritis. Electronically Signed   By: Prudencio Pair M.D.   On: 08/27/2019 03:51    ____________________________________________   PROCEDURES   Procedure(s) performed (including Critical Care):  Procedures   ____________________________________________   INITIAL IMPRESSION / MDM / [place] / ED COURSE  As part of my medical decision making, I reviewed the following data within the [place] notes reviewed and incorporated, Labs reviewed , Old chart reviewed and Notes from prior ED visits   Differential diagnosis includes, but is not limited to, acute diverticulitis, renal/ureteral colic, UTI/pyelonephritis, biliary colic, appendicitis.  Patient is well-appearing and in no distress at this time but she does have some significant tenderness to palpation of the abdomen on exam.  Her lab work is normal including a comprehensive  metabolic panel.  She has very small amount of hemoglobin in her urine but no evidence of infection and she was recently treated with Macrobid.  Lipase is normal and CBC is normal with no leukocytosis.  I would like to rule out acute diverticulitis with a CT scan.  This will also help evaluate for the possibility of a renal/ureteral stone which could be resulting in the hematuria.  She understands and agrees with the plan.  Proceeding with CT scan with oral and IV contrast for optimal imaging.      Clinical Course as of Aug 26 538  Tue Aug 27, 2019  0527 The patient has been resting comfortably.  She still has a little bit of abdominal discomfort.  CT scan did not show any emergent findings but were suggestive of some mild gastritis.  This is not completely consistent with her presentation given that she has having nausea but no vomiting.  However there is no evidence of an emergent medical condition.  She already has care established with Dr. Allen Norris and she says she is currently in the process of trying to set up an appointment with him for colonoscopy.  I encouraged her to discuss her symptoms with him given that they are somewhat atypical for gastritis and she is a 73 year old with ongoing abdominal pain.  She would benefit from gastroenterology follow-up, and she has no primary care doctor with whom she can follow-up.  I gave my usual and customary return precautions and prescriptions as indicated below.  CT abd/pelvis w/ IV contrast [CF]    Clinical Course User Index [CF] Hinda Kehr, MD     ____________________________________________  FINAL CLINICAL IMPRESSION(S) / ED DIAGNOSES  Final diagnoses:  Abdominal pain, unspecified abdominal location     MEDICATIONS GIVEN DURING THIS VISIT:  Medications  iohexol (OMNIPAQUE) 9 MG/ML oral solution 500 mL (has no administration in time range)  iohexol (OMNIPAQUE) 300 MG/ML solution 100 mL (100 mLs Intravenous Contrast Given 08/27/19 0335)       ED Discharge Orders         Ordered    sucralfate (CARAFATE) 1 g tablet  4 times daily PRN     08/27/19 0527    ondansetron (ZOFRAN ODT) 4 MG disintegrating tablet     08/27/19 [id number]          *Please note:  LAKSHMI LESEMAN was evaluated in  Emergency Department on 08/27/2019 for the symptoms described in the history of present illness. She was evaluated in the context of the global COVID-19 pandemic, which necessitated consideration that the patient might be at risk for infection with the SARS-CoV-2 virus that causes COVID-19. Institutional protocols and algorithms that pertain to the evaluation of patients at risk for COVID-19 are in a state of rapid change based on information released by regulatory bodies including the CDC and federal and state organizations. These policies and algorithms were followed during the patient's care in the ED.  Some ED evaluations and interventions may be delayed as a result of limited staffing during the pandemic.*  Note:  This document was prepared using Dragon voice recognition software and may include unintentional dictation errors.   Hinda Kehr, MD 08/27/19 [phone]

## 2019-08-27 NOTE — ED Notes (Signed)
Called CT to inform that pt cannot drink anymore contrast

## 2019-08-27 NOTE — Discharge Instructions (Signed)

## 2019-08-27 NOTE — ED Notes (Signed)
Left hip[employer] compliant voicemail for daughter of pt, who is also pt's ride home

## 2019-08-27 NOTE — ED Notes (Signed)
Pt in scan 

## 2019-08-27 NOTE — ED Notes (Signed)
PT returned from scan

## 2019-08-27 NOTE — ED Notes (Signed)
This RN went to find pt's daughter in parking lot, no car fitting the given description was visualized. Upon arriving back to pt's room, pt had ambulated to lobby, so signature for dc was not obtained. PT has copy of instructions and denies further questions.

## 2019-08-27 NOTE — Telephone Encounter (Signed)
Called and left a message for call back  

## 2019-09-09 ENCOUNTER — Telehealth: Payer: Self-pay | Admitting: Gastroenterology

## 2019-09-09 NOTE — Telephone Encounter (Signed)
Pt daughter is calling  To speak to Sonya Harper  regarding pt to move her apt up she is very concerned about pt she also would like to know what was found at the ER when pt went. Please call Desma Maxim

## 2019-09-12 ENCOUNTER — Ambulatory Visit (INDEPENDENT_AMBULATORY_CARE_PROVIDER_SITE_OTHER): Payer: Medicare Other | Admitting: Gastroenterology

## 2019-09-12 ENCOUNTER — Encounter: Payer: Self-pay | Admitting: Gastroenterology

## 2019-09-12 ENCOUNTER — Other Ambulatory Visit: Payer: Self-pay

## 2019-09-12 VITALS — BP 164/91 | HR 75 | Temp 98.4°F | Ht 66.0 in | Wt 182.4 lb

## 2019-09-12 DIAGNOSIS — K219 Gastro-esophageal reflux disease without esophagitis: Secondary | ICD-10-CM

## 2019-09-12 DIAGNOSIS — I480 Paroxysmal atrial fibrillation: Secondary | ICD-10-CM | POA: Insufficient documentation

## 2019-09-12 DIAGNOSIS — Z7901 Long term (current) use of anticoagulants: Secondary | ICD-10-CM | POA: Insufficient documentation

## 2019-09-12 NOTE — H&P (View-Only) (Signed)
Gastroenterology Consultation  Referring Provider:     Hubbard Hartshorn, FNP Primary Care Physician:  Delsa Grana, PA-C Primary Gastroenterologist:  Dr. Allen Norris     Reason for Consultation:     Lower abdominal pain        HPI:   Sonya Harper is a 74 y.o. y/o female referred for consultation & management of lower abdominal pain by Dr. Delsa Grana, PA-C.  This patient comes to see me after being seen in the later part of December in the ER for lower abdominal pain.  The patient at that time had reported that the symptoms were going on for 1 week.  She had stated that the symptoms were getting worse therefore she went to the ER.  The patient had a CT scan of the abdomen that showed:  IMPRESSION: Distal stomach /pylorus wall thickening which could be due to Gastritis.  The patient also has a history of having a colonoscopy 3 years ago and was recommended to have a repeat colonoscopy in 3 years.  The patient states that she was started on Carafate and has been taking Protonix and her abdominal pain has gone away.  She is still concerned because of the abnormal CT scan and because of a strong history of cancer in her family.  Past Medical History:  Diagnosis Date  . A-fib ([place])   . Allergy   . Arthritis    feet  . Asthma   . Breast neoplasm   . Chronic lower back pain    s/p fall injury  . Depression   . Dysrhythmia   . Fibromyalgia   . Hypertension   . Hypothyroidism   . Lumbar facet arthropathy 11/06/2018  . Prediabetes 02/27/2018  . Sciatica of right side   . Wears dentures    partial upper and lower    Past Surgical History:  Procedure Laterality Date  . ABDOMINAL HYSTERECTOMY    . BREAST SURGERY    . COLONOSCOPY WITH PROPOFOL N/A 05/13/2016   Procedure: COLONOSCOPY WITH PROPOFOL;  Surgeon: Lucilla Lame, MD;  Location: [facility];  Service: Endoscopy;  Laterality: N/A;  . POLYPECTOMY N/A 05/13/2016   Procedure: POLYPECTOMY;  Surgeon: Lucilla Lame, MD;  Location:  [facility];  Service: Endoscopy;  Laterality: N/A;    Prior to Admission medications   Medication Sig Start Date End Date Taking? Authorizing Provider  amLODipine (NORVASC) 5 MG tablet Take by mouth. 06/30/19 06/29/20  [provider]  atorvastatin (LIPITOR) 10 MG tablet Take 1 tablet (10 mg total) by mouth every other day. 12/29/18   Lada, Satira Anis, MD  BREO ELLIPTA 100-25 MCG/INH AEPB INHALE ONE DOSE BY MOUTH DAILY 12/12/18   Lada, Satira Anis, MD  Cholecalciferol (VITAMIN D-1000 MAX ST) 25 MCG (1000 UT) tablet Take by mouth.    [provider]  EPINEPHrine (EPIPEN 2-PAK) 0.3 mg/0.3 mL IJ SOAJ injection Use per package directions; one injection subcutaneously for LIFE-THREATENING emergency, deadly reaction 05/16/18   Lada, Satira Anis, MD  fluticasone (FLONASE) 50 MCG/ACT nasal spray Place 2 sprays into both nostrils daily. 12/11/18   Poulose, Bethel Born, NP  levocetirizine (XYZAL) 5 MG tablet Take 1 tablet (5 mg total) by mouth every evening. 07/03/19   Delsa Grana, PA-C  montelukast (SINGULAIR) 10 MG tablet Take 1 tablet (10 mg total) by mouth at bedtime. 07/03/19 08/02/19  Delsa Grana, PA-C  nitroGLYCERIN (NITROSTAT) 0.4 MG SL tablet Place 1 tablet (0.4 mg total) under the tongue every  5 (five) minutes as needed for chest pain. Max 3 pills per episode 08/30/18   Arnetha Courser, MD  ondansetron (ZOFRAN ODT) 4 MG disintegrating tablet Allow 1-2 tablets to dissolve in your mouth every 8 hours as needed for nausea/vomiting 08/27/19   Hinda Kehr, MD  pantoprazole (PROTONIX) 40 MG tablet Take 1 tablet (40 mg total) by mouth daily. 08/05/19   Delsa Grana, PA-C  sotalol (BETAPACE) 120 MG tablet Take by mouth. 06/28/19 06/27/20  [provider]  sucralfate (CARAFATE) 1 g tablet Take 1 tablet (1 g total) by mouth 4 (four) times daily as needed (for abdominal discomfort, nausea, and/or vomiting). 08/27/19   Hinda Kehr, MD  SYNTHROID 50 MCG tablet TAKE 1 BY MOUTH  DAILY. DO NOT SUBSTITUTE 12/04/14   [provider]  VENTOLIN HFA 108 (90 Base) MCG/ACT inhaler INHALE 2 PUFFS EVERY 6 HOURS AS NEEDED FOR SHORTNESS OF BREATH 01/30/18   Wilhelmina Mcardle, MD    Family History  Problem Relation Age of Onset  . Stroke Mother   . Breast cancer Sister 33       breast ca x3  . Colon cancer Paternal Aunt   . Cancer Sister      Social History   Tobacco Use  . Smoking status: Never Smoker  . Smokeless tobacco: Never Used  Substance Use Topics  . Alcohol use: No    Alcohol/week: 0.0 standard drinks  . Drug use: No    Allergies as of 09/12/2019 - Review Complete 08/27/2019  Allergen Reaction Noted  . Molds & smuts Anaphylaxis 04/07/2016  . Penicillins Nausea And Vomiting and Rash 03/03/2015    Review of Systems:    All systems reviewed and negative except where noted in HPI.   Physical Exam:  There were no vitals taken for this visit. No LMP recorded. Patient is postmenopausal. General:   Alert,  Well-developed, well-nourished, pleasant and cooperative in NAD Head:  Normocephalic and atraumatic. Eyes:  Sclera clear, no icterus.   Conjunctiva pink. Ears:  Normal auditory acuity. Neck:  Supple; no masses or thyromegaly. Lungs:  Respirations even and unlabored.  Clear throughout to auscultation.   No wheezes, crackles, or rhonchi. No acute distress. Heart:  Regular rate and rhythm; no murmurs, clicks, rubs, or gallops. Abdomen:  Normal bowel sounds.  No bruits.  Soft, non-tender and non-distended without masses, hepatosplenomegaly or hernias noted.  No guarding or rebound tenderness.  Negative Carnett sign.   Rectal:  Deferred.  Pulses:  Normal pulses noted. Extremities:  No clubbing or edema.  No cyanosis. Neurologic:  Alert and oriented x3;  grossly normal neurologically. Skin:  Intact without significant lesions or rashes.  No jaundice. Lymph Nodes:  No significant cervical adenopathy. Psych:  Alert and cooperative. Normal mood and  affect.  Imaging Studies: CT abd/pelvis w/ IV contrast  Result Date: 08/27/2019 CLINICAL DATA:  Nausea vomiting lower abdominal pain EXAM: CT ABDOMEN AND PELVIS WITH CONTRAST TECHNIQUE: Multidetector CT imaging of the abdomen and pelvis was performed using the standard protocol following bolus administration of intravenous contrast. CONTRAST:  169mL OMNIPAQUE IOHEXOL 300 MG/ML  SOLN COMPARISON:  February 14, 2010 FINDINGS: Lower chest: The visualized heart size within normal limits. No pericardial fluid/thickening. No hiatal hernia. The visualized portions of the lungs are clear. Hepatobiliary: Tiny 1 cm low-density lesion in the posterior right liver lobe as on prior exam, likely hepatic cyst. The main portal vein is patent. No evidence of calcified gallstones, gallbladder wall thickening or biliary dilatation.  Pancreas: Unremarkable. No pancreatic ductal dilatation or surrounding inflammatory changes. Spleen: Normal in size without focal abnormality. Adrenals/Urinary Tract: Both adrenal glands appear normal. There is a 3 cm low-density lesion seen off the lower pole the left kidney, likely renal cyst. There is a 1 cm low-density lesion seen in the lower pole the right kidney. No hydronephrosis. Bladder is unremarkable. Stomach/Bowel: There is mild circumferential wall thickening seen within the distal stomach/pylorus. No significant surrounding fat stranding changes however are seen. No inflammatory changes, wall thickening, or obstructive findings.The appendix is normal. Vascular/Lymphatic: There are no enlarged mesenteric, retroperitoneal, or pelvic lymph nodes. No significant vascular findings are present. Reproductive: The patient is status post hysterectomy. Other: No evidence of abdominal wall mass or hernia. Musculoskeletal: No acute or significant osseous findings. IMPRESSION: Distal stomach /pylorus wall thickening which could be due to gastritis. Electronically Signed   By: Prudencio Pair M.D.   On:  08/27/2019 03:51    Assessment and Plan:   QUINLEE WADMAN is a 74 y.o. y/o female who comes in today with a history of an abnormal CT scan showing thickening of the antrum and pylorus consistent with gastritis and her previous abdominal pain.  The patient also has a history of polyps and was recommended due to the size and number of polyps to have a repeat colonoscopy in 3 years.  The patient will be set up for an EGD and colonoscopy. I have discussed risks & benefits which include, but are not limited to, bleeding, infection, perforation & drug reaction.  The patient agrees with this plan & written consent will be obtained.       Lucilla Lame, MD. Marval Regal    Note: This dictation was prepared with Dragon dictation along with smaller phrase technology. Any transcriptional errors that result from this process are unintentional.

## 2019-09-12 NOTE — Progress Notes (Signed)
Gastroenterology Consultation  Referring Provider:     Hubbard Hartshorn, FNP Primary Care Physician:  Delsa Grana, PA-C Primary Gastroenterologist:  Dr. Allen Norris     Reason for Consultation:     Lower abdominal pain        HPI:   Sonya Harper is a 74 y.o. y/o female referred for consultation & management of lower abdominal pain by Dr. Delsa Grana, PA-C.  This patient comes to see me after being seen in the later part of December in the ER for lower abdominal pain.  The patient at that time had reported that the symptoms were going on for 1 week.  She had stated that the symptoms were getting worse therefore she went to the ER.  The patient had a CT scan of the abdomen that showed:  IMPRESSION: Distal stomach /pylorus wall thickening which could be due to Gastritis.  The patient also has a history of having a colonoscopy 3 years ago and was recommended to have a repeat colonoscopy in 3 years.  The patient states that she was started on Carafate and has been taking Protonix and her abdominal pain has gone away.  She is still concerned because of the abnormal CT scan and because of a strong history of cancer in her family.  Past Medical History:  Diagnosis Date  . A-fib ([place])   . Allergy   . Arthritis    feet  . Asthma   . Breast neoplasm   . Chronic lower back pain    s/p fall injury  . Depression   . Dysrhythmia   . Fibromyalgia   . Hypertension   . Hypothyroidism   . Lumbar facet arthropathy 11/06/2018  . Prediabetes 02/27/2018  . Sciatica of right side   . Wears dentures    partial upper and lower    Past Surgical History:  Procedure Laterality Date  . ABDOMINAL HYSTERECTOMY    . BREAST SURGERY    . COLONOSCOPY WITH PROPOFOL N/A 05/13/2016   Procedure: COLONOSCOPY WITH PROPOFOL;  Surgeon: Lucilla Lame, MD;  Location: [facility];  Service: Endoscopy;  Laterality: N/A;  . POLYPECTOMY N/A 05/13/2016   Procedure: POLYPECTOMY;  Surgeon: Lucilla Lame, MD;  Location:  [facility];  Service: Endoscopy;  Laterality: N/A;    Prior to Admission medications   Medication Sig Start Date End Date Taking? Authorizing Provider  amLODipine (NORVASC) 5 MG tablet Take by mouth. 06/30/19 06/29/20  [provider]  atorvastatin (LIPITOR) 10 MG tablet Take 1 tablet (10 mg total) by mouth every other day. 12/29/18   Lada, Satira Anis, MD  BREO ELLIPTA 100-25 MCG/INH AEPB INHALE ONE DOSE BY MOUTH DAILY 12/12/18   Lada, Satira Anis, MD  Cholecalciferol (VITAMIN D-1000 MAX ST) 25 MCG (1000 UT) tablet Take by mouth.    [provider]  EPINEPHrine (EPIPEN 2-PAK) 0.3 mg/0.3 mL IJ SOAJ injection Use per package directions; one injection subcutaneously for LIFE-THREATENING emergency, deadly reaction 05/16/18   Lada, Satira Anis, MD  fluticasone (FLONASE) 50 MCG/ACT nasal spray Place 2 sprays into both nostrils daily. 12/11/18   Poulose, Bethel Born, NP  levocetirizine (XYZAL) 5 MG tablet Take 1 tablet (5 mg total) by mouth every evening. 07/03/19   Delsa Grana, PA-C  montelukast (SINGULAIR) 10 MG tablet Take 1 tablet (10 mg total) by mouth at bedtime. 07/03/19 08/02/19  Delsa Grana, PA-C  nitroGLYCERIN (NITROSTAT) 0.4 MG SL tablet Place 1 tablet (0.4 mg total) under the tongue every  5 (five) minutes as needed for chest pain. Max 3 pills per episode 08/30/18   Arnetha Courser, MD  ondansetron (ZOFRAN ODT) 4 MG disintegrating tablet Allow 1-2 tablets to dissolve in your mouth every 8 hours as needed for nausea/vomiting 08/27/19   Hinda Kehr, MD  pantoprazole (PROTONIX) 40 MG tablet Take 1 tablet (40 mg total) by mouth daily. 08/05/19   Delsa Grana, PA-C  sotalol (BETAPACE) 120 MG tablet Take by mouth. 06/28/19 06/27/20  [provider]  sucralfate (CARAFATE) 1 g tablet Take 1 tablet (1 g total) by mouth 4 (four) times daily as needed (for abdominal discomfort, nausea, and/or vomiting). 08/27/19   Hinda Kehr, MD  SYNTHROID 50 MCG tablet TAKE 1 BY MOUTH  DAILY. DO NOT SUBSTITUTE 12/04/14   [provider]  VENTOLIN HFA 108 (90 Base) MCG/ACT inhaler INHALE 2 PUFFS EVERY 6 HOURS AS NEEDED FOR SHORTNESS OF BREATH 01/30/18   Wilhelmina Mcardle, MD    Family History  Problem Relation Age of Onset  . Stroke Mother   . Breast cancer Sister 69       breast ca x3  . Colon cancer Paternal Aunt   . Cancer Sister      Social History   Tobacco Use  . Smoking status: Never Smoker  . Smokeless tobacco: Never Used  Substance Use Topics  . Alcohol use: No    Alcohol/week: 0.0 standard drinks  . Drug use: No    Allergies as of 09/12/2019 - Review Complete 08/27/2019  Allergen Reaction Noted  . Molds & smuts Anaphylaxis 04/07/2016  . Penicillins Nausea And Vomiting and Rash 03/03/2015    Review of Systems:    All systems reviewed and negative except where noted in HPI.   Physical Exam:  There were no vitals taken for this visit. No LMP recorded. Patient is postmenopausal. General:   Alert,  Well-developed, well-nourished, pleasant and cooperative in NAD Head:  Normocephalic and atraumatic. Eyes:  Sclera clear, no icterus.   Conjunctiva pink. Ears:  Normal auditory acuity. Neck:  Supple; no masses or thyromegaly. Lungs:  Respirations even and unlabored.  Clear throughout to auscultation.   No wheezes, crackles, or rhonchi. No acute distress. Heart:  Regular rate and rhythm; no murmurs, clicks, rubs, or gallops. Abdomen:  Normal bowel sounds.  No bruits.  Soft, non-tender and non-distended without masses, hepatosplenomegaly or hernias noted.  No guarding or rebound tenderness.  Negative Carnett sign.   Rectal:  Deferred.  Pulses:  Normal pulses noted. Extremities:  No clubbing or edema.  No cyanosis. Neurologic:  Alert and oriented x3;  grossly normal neurologically. Skin:  Intact without significant lesions or rashes.  No jaundice. Lymph Nodes:  No significant cervical adenopathy. Psych:  Alert and cooperative. Normal mood and  affect.  Imaging Studies: CT abd/pelvis w/ IV contrast  Result Date: 08/27/2019 CLINICAL DATA:  Nausea vomiting lower abdominal pain EXAM: CT ABDOMEN AND PELVIS WITH CONTRAST TECHNIQUE: Multidetector CT imaging of the abdomen and pelvis was performed using the standard protocol following bolus administration of intravenous contrast. CONTRAST:  113mL OMNIPAQUE IOHEXOL 300 MG/ML  SOLN COMPARISON:  February 14, 2010 FINDINGS: Lower chest: The visualized heart size within normal limits. No pericardial fluid/thickening. No hiatal hernia. The visualized portions of the lungs are clear. Hepatobiliary: Tiny 1 cm low-density lesion in the posterior right liver lobe as on prior exam, likely hepatic cyst. The main portal vein is patent. No evidence of calcified gallstones, gallbladder wall thickening or biliary dilatation.  Pancreas: Unremarkable. No pancreatic ductal dilatation or surrounding inflammatory changes. Spleen: Normal in size without focal abnormality. Adrenals/Urinary Tract: Both adrenal glands appear normal. There is a 3 cm low-density lesion seen off the lower pole the left kidney, likely renal cyst. There is a 1 cm low-density lesion seen in the lower pole the right kidney. No hydronephrosis. Bladder is unremarkable. Stomach/Bowel: There is mild circumferential wall thickening seen within the distal stomach/pylorus. No significant surrounding fat stranding changes however are seen. No inflammatory changes, wall thickening, or obstructive findings.The appendix is normal. Vascular/Lymphatic: There are no enlarged mesenteric, retroperitoneal, or pelvic lymph nodes. No significant vascular findings are present. Reproductive: The patient is status post hysterectomy. Other: No evidence of abdominal wall mass or hernia. Musculoskeletal: No acute or significant osseous findings. IMPRESSION: Distal stomach /pylorus wall thickening which could be due to gastritis. Electronically Signed   By: Prudencio Pair M.D.   On:  08/27/2019 03:51    Assessment and Plan:   KARIANN BESECKER is a 74 y.o. y/o female who comes in today with a history of an abnormal CT scan showing thickening of the antrum and pylorus consistent with gastritis and her previous abdominal pain.  The patient also has a history of polyps and was recommended due to the size and number of polyps to have a repeat colonoscopy in 3 years.  The patient will be set up for an EGD and colonoscopy. I have discussed risks & benefits which include, but are not limited to, bleeding, infection, perforation & drug reaction.  The patient agrees with this plan & written consent will be obtained.       Lucilla Lame, MD. Marval Regal    Note: This dictation was prepared with Dragon dictation along with smaller phrase technology. Any transcriptional errors that result from this process are unintentional.

## 2019-09-13 ENCOUNTER — Other Ambulatory Visit: Payer: Self-pay

## 2019-09-13 DIAGNOSIS — K219 Gastro-esophageal reflux disease without esophagitis: Secondary | ICD-10-CM

## 2019-09-18 ENCOUNTER — Other Ambulatory Visit: Payer: Self-pay

## 2019-09-18 ENCOUNTER — Other Ambulatory Visit
Admission: RE | Admit: 2019-09-18 | Discharge: 2019-09-18 | Disposition: A | Discharge status: Home / Self Care | Payer: Medicare Other | Source: Ambulatory Visit | Attending: Gastroenterology | Admitting: Gastroenterology

## 2019-09-18 DIAGNOSIS — Z01812 Encounter for preprocedural laboratory examination: Secondary | ICD-10-CM | POA: Insufficient documentation

## 2019-09-18 DIAGNOSIS — Z20822 Contact with and (suspected) exposure to covid-19: Secondary | ICD-10-CM | POA: Diagnosis not present

## 2019-09-18 LAB — SARS CORONAVIRUS 2 (TAT 6-24 HRS): SARS Coronavirus 2: NEGATIVE

## 2019-09-18 MED ORDER — SUPREP BOWEL PREP KIT 17.5-3.13-1.6 GM/177ML PO SOLN: 1.0000 | ORAL | 0 refills | Status: DC

## 2019-09-20 ENCOUNTER — Other Ambulatory Visit: Payer: Self-pay

## 2019-09-20 ENCOUNTER — Ambulatory Visit: Payer: Medicare Other | Admitting: Certified Registered"

## 2019-09-20 ENCOUNTER — Encounter: Payer: Self-pay | Admitting: Gastroenterology

## 2019-09-20 ENCOUNTER — Encounter
Admission: RE | Disposition: A | Discharge status: Home / Self Care | Payer: Self-pay | Source: Ambulatory Visit | Attending: Gastroenterology

## 2019-09-20 ENCOUNTER — Ambulatory Visit
Admission: RE | Admit: 2019-09-20 | Discharge: 2019-09-20 | Disposition: A | Discharge status: Home / Self Care | Payer: Medicare Other | Source: Ambulatory Visit | Attending: Gastroenterology | Admitting: Gastroenterology

## 2019-09-20 DIAGNOSIS — R7303 Prediabetes: Secondary | ICD-10-CM | POA: Insufficient documentation

## 2019-09-20 DIAGNOSIS — Z853 Personal history of malignant neoplasm of breast: Secondary | ICD-10-CM | POA: Insufficient documentation

## 2019-09-20 DIAGNOSIS — D122 Benign neoplasm of ascending colon: Secondary | ICD-10-CM | POA: Insufficient documentation

## 2019-09-20 DIAGNOSIS — E039 Hypothyroidism, unspecified: Secondary | ICD-10-CM | POA: Diagnosis not present

## 2019-09-20 DIAGNOSIS — M797 Fibromyalgia: Secondary | ICD-10-CM | POA: Diagnosis not present

## 2019-09-20 DIAGNOSIS — K635 Polyp of colon: Secondary | ICD-10-CM

## 2019-09-20 DIAGNOSIS — K5730 Diverticulosis of large intestine without perforation or abscess without bleeding: Secondary | ICD-10-CM | POA: Diagnosis not present

## 2019-09-20 DIAGNOSIS — I10 Essential (primary) hypertension: Secondary | ICD-10-CM | POA: Insufficient documentation

## 2019-09-20 DIAGNOSIS — K2950 Unspecified chronic gastritis without bleeding: Secondary | ICD-10-CM | POA: Insufficient documentation

## 2019-09-20 DIAGNOSIS — K449 Diaphragmatic hernia without obstruction or gangrene: Secondary | ICD-10-CM | POA: Diagnosis not present

## 2019-09-20 DIAGNOSIS — K2970 Gastritis, unspecified, without bleeding: Secondary | ICD-10-CM | POA: Insufficient documentation

## 2019-09-20 DIAGNOSIS — Z7951 Long term (current) use of inhaled steroids: Secondary | ICD-10-CM | POA: Insufficient documentation

## 2019-09-20 DIAGNOSIS — Z79899 Other long term (current) drug therapy: Secondary | ICD-10-CM | POA: Diagnosis not present

## 2019-09-20 DIAGNOSIS — Z79890 Hormone replacement therapy (postmenopausal): Secondary | ICD-10-CM | POA: Diagnosis not present

## 2019-09-20 DIAGNOSIS — K648 Other hemorrhoids: Secondary | ICD-10-CM | POA: Diagnosis not present

## 2019-09-20 DIAGNOSIS — D123 Benign neoplasm of transverse colon: Secondary | ICD-10-CM | POA: Diagnosis not present

## 2019-09-20 DIAGNOSIS — Z800 Family history of malignant neoplasm of digestive organs: Secondary | ICD-10-CM

## 2019-09-20 DIAGNOSIS — K769 Liver disease, unspecified: Secondary | ICD-10-CM | POA: Insufficient documentation

## 2019-09-20 DIAGNOSIS — Z803 Family history of malignant neoplasm of breast: Secondary | ICD-10-CM | POA: Diagnosis not present

## 2019-09-20 DIAGNOSIS — K219 Gastro-esophageal reflux disease without esophagitis: Secondary | ICD-10-CM | POA: Diagnosis not present

## 2019-09-20 DIAGNOSIS — R933 Abnormal findings on diagnostic imaging of other parts of digestive tract: Secondary | ICD-10-CM | POA: Diagnosis present

## 2019-09-20 DIAGNOSIS — Z86010 Personal history of colonic polyps: Secondary | ICD-10-CM | POA: Diagnosis not present

## 2019-09-20 DIAGNOSIS — I4891 Unspecified atrial fibrillation: Secondary | ICD-10-CM | POA: Diagnosis not present

## 2019-09-20 HISTORY — PX: COLONOSCOPY WITH PROPOFOL: SHX5780

## 2019-09-20 HISTORY — PX: ESOPHAGOGASTRODUODENOSCOPY (EGD) WITH PROPOFOL: SHX5813

## 2019-09-20 SURGERY — COLONOSCOPY WITH PROPOFOL
Anesthesia: General

## 2019-09-20 MED ORDER — LIDOCAINE HCL (CARDIAC) PF 100 MG/5ML IV SOSY
PREFILLED_SYRINGE | INTRAVENOUS | Status: DC | PRN
  Administered 2019-09-20: 100 mg via INTRATRACHEAL

## 2019-09-20 MED ORDER — PHENYLEPHRINE HCL (PRESSORS) 10 MG/ML IV SOLN
INTRAVENOUS | Status: DC | PRN
  Administered 2019-09-20: 100 ug via INTRAVENOUS

## 2019-09-20 MED ORDER — PROPOFOL 500 MG/50ML IV EMUL
INTRAVENOUS | Status: DC | PRN
  Administered 2019-09-20: 165 ug/kg/min via INTRAVENOUS

## 2019-09-20 MED ORDER — PROPOFOL 10 MG/ML IV BOLUS
INTRAVENOUS | Status: DC | PRN
  Administered 2019-09-20 (×2): 10 mg via INTRAVENOUS
  Administered 2019-09-20: 50 mg via INTRAVENOUS

## 2019-09-20 MED ORDER — SODIUM CHLORIDE 0.9 % IV SOLN
INTRAVENOUS | Status: DC
  Administered 2019-09-20: 1000 mL via INTRAVENOUS

## 2019-09-20 NOTE — Anesthesia Preprocedure Evaluation (Signed)
Anesthesia Evaluation  Patient identified by MRN, date of birth, ID band Patient awake    Reviewed: Allergy & Precautions, NPO status , Patient's Chart, lab work & pertinent test results  History of Anesthesia Complications Negative for: history of anesthetic complications  Airway Mallampati: II  TM Distance: >3 FB Neck ROM: Full    Dental  (+) Partial Lower, Partial Upper   Pulmonary asthma , neg sleep apnea, neg COPD,    breath sounds clear to auscultation- rhonchi (-) wheezing      Cardiovascular hypertension, (-) CAD, (-) Past MI, (-) Cardiac Stents and (-) CABG + dysrhythmias Atrial Fibrillation  Rhythm:Regular Rate:Normal - Systolic murmurs and - Diastolic murmurs    Neuro/Psych neg Seizures PSYCHIATRIC DISORDERS Depression negative neurological ROS     GI/Hepatic Neg liver ROS, GERD  ,  Endo/Other  neg diabetesHypothyroidism   Renal/GU negative Renal ROS     Musculoskeletal  (+) Arthritis , Fibromyalgia -  Abdominal (+) - obese,   Peds  Hematology negative hematology ROS (+)   Anesthesia Other Findings Past Medical History: No date: A-fib ([place]) No date: Allergy No date: Arthritis     Comment:  feet No date: Asthma No date: Breast neoplasm No date: Chronic lower back pain     Comment:  s/p fall injury No date: Depression No date: Dysrhythmia No date: Fibromyalgia No date: Hypertension No date: Hypothyroidism 11/06/2018: Lumbar facet arthropathy 02/27/2018: Prediabetes No date: Sciatica of right side No date: Wears dentures     Comment:  partial upper and lower   Reproductive/Obstetrics                             Anesthesia Physical Anesthesia Plan  ASA: III  Anesthesia Plan: General   Post-op Pain Management:    Induction: Intravenous  PONV Risk Score and Plan: 2 and Propofol infusion  Airway Management Planned: Natural Airway  Additional Equipment:    Intra-op Plan:   Post-operative Plan:   Informed Consent: I have reviewed the patients History and Physical, chart, labs and discussed the procedure including the risks, benefits and alternatives for the proposed anesthesia with the patient or authorized representative who has indicated his/her understanding and acceptance.     Dental advisory given  Plan Discussed with: CRNA and Anesthesiologist  Anesthesia Plan Comments:         Anesthesia Quick Evaluation

## 2019-09-20 NOTE — Op Note (Signed)
[facility] Patient Name: Sonya Harper Procedure Date: 09/20/2019 9:43 AM MRN: [id number] Account #: [id number] Date of Birth: 04/23/1946 Admit Type: Outpatient Age: 74 Room: [facility] ENDO ROOM 4 Gender: Female Note Status: Finalized Procedure:             Colonoscopy Indications:           High risk colon cancer surveillance: Personal history                         of colonic polyps three years ago Providers:             Lucilla Lame MD, MD Medicines:             Propofol per Anesthesia Complications:         No immediate complications. Procedure:             Pre-Anesthesia Assessment:                        - Prior to the procedure, a History and Physical was                         performed, and patient medications and allergies were                         reviewed. The patient's tolerance of previous                         anesthesia was also reviewed. The risks and benefits                         of the procedure and the sedation options and risks                         were discussed with the patient. All questions were                         answered, and informed consent was obtained. Prior                         Anticoagulants: The patient has taken no previous                         anticoagulant or antiplatelet agents. ASA Grade                         Assessment: II - A patient with mild systemic disease.                         After reviewing the risks and benefits, the patient                         was deemed in satisfactory condition to undergo the                         procedure.                        After obtaining informed consent, the colonoscope was  passed under direct vision. Throughout the procedure,                         the patient's blood pressure, pulse, and oxygen                         saturations were monitored continuously. The                         Colonoscope was introduced  through the anus and                         advanced to the the cecum, identified by appendiceal                         orifice and ileocecal valve. The colonoscopy was                         performed without difficulty. The patient tolerated                         the procedure well. The quality of the bowel                         preparation was excellent. Findings:      The perianal and digital rectal examinations were normal.      Two sessile polyps were found in the ascending colon. The polyps were 3       to 4 mm in size. These polyps were removed with a cold biopsy forceps.       Resection and retrieval were complete.      A 7 mm polyp was found in the ascending colon. The polyp was sessile.       The polyp was removed with a cold snare. Resection and retrieval were       complete.      Two sessile polyps were found in the transverse colon. The polyps were 4       to 5 mm in size. These polyps were removed with a cold biopsy forceps.       Resection and retrieval were complete.      Multiple small-mouthed diverticula were found in the sigmoid colon.      Non-bleeding internal hemorrhoids were found during retroflexion. The       hemorrhoids were Grade II (internal hemorrhoids that prolapse but reduce       spontaneously). Impression:            - Two 3 to 4 mm polyps in the ascending colon, removed                         with a cold biopsy forceps. Resected and retrieved.                        - One 7 mm polyp in the ascending colon, removed with                         a cold snare. Resected and retrieved.                        -  Two 4 to 5 mm polyps in the transverse colon,                         removed with a cold biopsy forceps. Resected and                         retrieved.                        - Diverticulosis in the sigmoid colon.                        - Non-bleeding internal hemorrhoids. Recommendation:        - Discharge patient to home.                         - Resume previous diet.                        - Continue present medications.                        - Await pathology results.                        - Repeat colonoscopy in 5 years for surveillance. Procedure Code(s):     --- Professional ---                        [id number], Colonoscopy, flexible; with removal of                         tumor(s), polyp(s), or other lesion(s) by snare                         technique                        45380, 53, Colonoscopy, flexible; with biopsy, single                         or multiple Diagnosis Code(s):     --- Professional ---                        Z86.010, Personal history of colonic polyps                        K63.5, Polyp of colon CPT copyright 2019 American Medical Association. All rights reserved. The codes documented in this report are preliminary and upon coder review may  be revised to meet current compliance requirements. Lucilla Lame MD, MD 09/20/2019 10:09:28 AM This report has been signed electronically. Number of Addenda: 0 Note Initiated On: 09/20/2019 9:43 AM Scope Withdrawal Time: 0 hours 6 minutes 46 seconds  Total Procedure Duration: 0 hours 9 minutes 31 seconds       [facility]

## 2019-09-20 NOTE — Transfer of Care (Signed)
Immediate Anesthesia Transfer of Care Note  Patient: Sonya Harper  Procedure(s) Performed: COLONOSCOPY WITH PROPOFOL (N/A ) ESOPHAGOGASTRODUODENOSCOPY (EGD) WITH PROPOFOL (N/A )  Patient Location: Endoscopy Unit  Anesthesia Type:General  Level of Consciousness: drowsy, patient cooperative and responds to stimulation  Airway & Oxygen Therapy: Patient Spontanous Breathing and Patient connected to face mask oxygen  Post-op Assessment: Report given to RN and Post -op Vital signs reviewed and stable  Post vital signs: Reviewed and stable  Last Vitals:  Vitals Value Taken Time  BP 112/82 09/20/19 1014  Temp    Pulse 70 09/20/19 1015  Resp 26 09/20/19 1015  SpO2 100 % 09/20/19 1015  Vitals shown include unvalidated device data.  Last Pain:  Vitals:   09/20/19 0925  TempSrc: Temporal  PainSc: 0-No pain         Complications: No apparent anesthesia complications

## 2019-09-20 NOTE — Op Note (Signed)
[facility] Patient Name: Sonya Harper Procedure Date: 09/20/2019 9:44 AM MRN: [id number] Account #: [id number] Date of Birth: [birth year] Admit Type: Outpatient Age: 74 Room: [facility] ENDO ROOM 4 Gender: Female Note Status: Finalized Procedure:             Upper GI endoscopy Indications:           Abnormal CT of the GI tract Providers:             Lucilla Lame MD, MD Medicines:             Propofol per Anesthesia Complications:         No immediate complications. Procedure:             Pre-Anesthesia Assessment:                        - Prior to the procedure, a History and Physical was                         performed, and patient medications and allergies were                         reviewed. The patient's tolerance of previous                         anesthesia was also reviewed. The risks and benefits                         of the procedure and the sedation options and risks                         were discussed with the patient. All questions were                         answered, and informed consent was obtained. Prior                         Anticoagulants: The patient has taken no previous                         anticoagulant or antiplatelet agents. ASA Grade                         Assessment: II - A patient with mild systemic disease.                         After reviewing the risks and benefits, the patient                         was deemed in satisfactory condition to undergo the                         procedure.                        After obtaining informed consent, the endoscope was                         passed under direct vision. Throughout the procedure,  the patient's blood pressure, pulse, and oxygen                         saturations were monitored continuously. The Endoscope                         was introduced through the mouth, and advanced to the                         second part of  duodenum. The upper GI endoscopy was                         accomplished without difficulty. The patient tolerated                         the procedure well. Findings:      A small hiatal hernia was present.      Diffuse mild inflammation characterized by erythema was found in the       gastric antrum. Biopsies were taken with a cold forceps for histology.      The examined duodenum was normal. Impression:            - Small hiatal hernia.                        - Gastritis. Biopsied.                        - Normal examined duodenum. Recommendation:        - Discharge patient to home.                        - Resume previous diet.                        - Continue present medications.                        - Await pathology results. Procedure Code(s):     --- Professional ---                        [phone], Esophagogastroduodenoscopy, flexible,                         transoral; with biopsy, single or multiple Diagnosis Code(s):     --- Professional ---                        R93.3, Abnormal findings on diagnostic imaging of                         other parts of digestive tract                        K29.70, Gastritis, unspecified, without bleeding CPT copyright 2019 American Medical Association. All rights reserved. The codes documented in this report are preliminary and upon coder review may  be revised to meet current compliance requirements. Lucilla Lame MD, MD 09/20/2019 9:54:59 AM This report has been signed electronically. Number of Addenda: 0 Note Initiated On: 09/20/2019 9:44 AM Estimated Blood Loss:  Estimated blood loss: none.  [facility]

## 2019-09-20 NOTE — Interval H&P Note (Signed)
History and Physical Interval Note:  09/20/2019 9:34 AM  Sonya Harper  has presented today for surgery, with the diagnosis of Hx of colon polyps Z86.010 GERD K21.9.  The various methods of treatment have been discussed with the patient and family. After consideration of risks, benefits and other options for treatment, the patient has consented to  Procedure(s): COLONOSCOPY WITH PROPOFOL (N/A) ESOPHAGOGASTRODUODENOSCOPY (EGD) WITH PROPOFOL (N/A) as a surgical intervention.  The patient's history has been reviewed, patient examined, no change in status, stable for surgery.  I have reviewed the patient's chart and labs.  Questions were answered to the patient's satisfaction.     [name]

## 2019-09-20 NOTE — Anesthesia Postprocedure Evaluation (Signed)
Anesthesia Post Note  Patient: Sonya Harper  Procedure(s) Performed: COLONOSCOPY WITH PROPOFOL (N/A ) ESOPHAGOGASTRODUODENOSCOPY (EGD) WITH PROPOFOL (N/A )  Patient location during evaluation: Endoscopy Anesthesia Type: General Level of consciousness: awake and alert and oriented Pain management: pain level controlled Vital Signs Assessment: post-procedure vital signs reviewed and stable Respiratory status: spontaneous breathing, nonlabored ventilation and respiratory function stable Cardiovascular status: blood pressure returned to baseline and stable Postop Assessment: no signs of nausea or vomiting Anesthetic complications: no     Last Vitals:  Vitals:   09/20/19 1020 09/20/19 1029  BP:    Pulse: 73 74  Resp: (!) 26 (!) 21  Temp:    SpO2: 98% 99%    Last Pain:  Vitals:   09/20/19 0925  TempSrc: Temporal  PainSc: 0-No pain                 Eberardo Demello

## 2019-09-22 ENCOUNTER — Other Ambulatory Visit: Payer: Self-pay | Admitting: Family Medicine

## 2019-09-22 DIAGNOSIS — I208 Other forms of angina pectoris: Secondary | ICD-10-CM

## 2019-09-23 ENCOUNTER — Telehealth: Payer: Self-pay | Admitting: Gastroenterology

## 2019-09-23 ENCOUNTER — Encounter: Payer: Self-pay | Admitting: *Deleted

## 2019-09-23 LAB — SURGICAL PATHOLOGY

## 2019-09-23 NOTE — Telephone Encounter (Signed)
Spoke with pt and she stated she is still having quite a bit of abdominal pain. I advised her other than the polyps, nothing that would explain this pain showed up. Please advise.

## 2019-09-23 NOTE — Telephone Encounter (Signed)
Patient called stating she had a colonoscopy on 09-20-2019.Patient states she is having stomach pain unable to sleep before & after the colonoscopy. She would like to know what to do? Please call.

## 2019-09-24 ENCOUNTER — Encounter: Payer: Self-pay | Admitting: Gastroenterology

## 2019-09-24 ENCOUNTER — Other Ambulatory Visit: Admission: RE | Admit: 2019-09-24 | Payer: Medicare Other | Source: Ambulatory Visit

## 2019-09-26 ENCOUNTER — Encounter (INDEPENDENT_AMBULATORY_CARE_PROVIDER_SITE_OTHER): Payer: Self-pay

## 2019-09-26 ENCOUNTER — Encounter: Payer: Self-pay | Admitting: Gastroenterology

## 2019-09-26 ENCOUNTER — Other Ambulatory Visit: Payer: Self-pay

## 2019-09-26 ENCOUNTER — Ambulatory Visit (INDEPENDENT_AMBULATORY_CARE_PROVIDER_SITE_OTHER): Payer: Medicare Other | Admitting: Gastroenterology

## 2019-09-26 VITALS — BP 135/78 | HR 63 | Temp 97.8°F | Ht 66.0 in | Wt 178.4 lb

## 2019-09-26 DIAGNOSIS — I208 Other forms of angina pectoris: Secondary | ICD-10-CM

## 2019-09-26 DIAGNOSIS — R109 Unspecified abdominal pain: Secondary | ICD-10-CM | POA: Diagnosis not present

## 2019-09-26 NOTE — Progress Notes (Signed)
Primary Care Physician: Delsa Grana, PA-C  Primary Gastroenterologist:  Dr. Lucilla Lame  Chief Complaint  Patient presents with  . Follow up procedure results    Still having abdominal pain    HPI: Sonya Harper is a 74 y.o. female here for follow-up of abdominal pain.  The patient had seen me back at the beginning of the year for abdominal pain.  The patient was having lower abdominal pain and a CT scan that showed possible antral gastritis.  The patient was also due for repeat colonoscopy as reported by her previous gastroenterologist.  The patient was set up for an EGD and colonoscopy and was found to have multiple colon polyps that were adenomas with a recommended repeat colonoscopy in 5 years.  The stomach showed chronic inactive gastritis without any ulcerations or active inflammation.  The patient had been called the office stating that she was still having abdominal discomfort and was asked to come in to the office. The patient reports her pain to be in the back pain on her left side that radiates around the front to the area above her hip.  Current Outpatient Medications  Medication Sig Dispense Refill  . amLODipine (NORVASC) 5 MG tablet Take by mouth.    Marland Kitchen apixaban (ELIQUIS) 5 MG TABS tablet Take by mouth.    Marland Kitchen atorvastatin (LIPITOR) 10 MG tablet Take 1 tablet (10 mg total) by mouth every other day. 45 tablet 1  . BREO ELLIPTA 100-25 MCG/INH AEPB INHALE ONE DOSE BY MOUTH DAILY 3 each 1  . Cholecalciferol (VITAMIN D-1000 MAX ST) 25 MCG (1000 UT) tablet Take by mouth.    . EPINEPHrine (EPIPEN 2-PAK) 0.3 mg/0.3 mL IJ SOAJ injection Use per package directions; one injection subcutaneously for LIFE-THREATENING emergency, deadly reaction 2 Device 1  . fluticasone (FLONASE) 50 MCG/ACT nasal spray Place 2 sprays into both nostrils daily. 16 g 6  . levocetirizine (XYZAL) 5 MG tablet Take 1 tablet (5 mg total) by mouth every evening. 30 tablet 2  . metoprolol succinate (TOPROL-XL)  100 MG 24 hr tablet     . Na Sulfate-K Sulfate-Mg Sulf (SUPREP BOWEL PREP KIT) 17.5-3.13-1.6 GM/177ML SOLN Take 1 kit by mouth as directed. 354 mL 0  . nitroGLYCERIN (NITROSTAT) 0.4 MG SL tablet Place 1 tablet (0.4 mg total) under the tongue every 5 (five) minutes as needed for chest pain. Max 3 pills per episode 50 tablet 3  . ondansetron (ZOFRAN ODT) 4 MG disintegrating tablet Allow 1-2 tablets to dissolve in your mouth every 8 hours as needed for nausea/vomiting 30 tablet 0  . pantoprazole (PROTONIX) 40 MG tablet Take 1 tablet (40 mg total) by mouth daily. 90 tablet 0  . sotalol (BETAPACE) 120 MG tablet Take by mouth.    . sucralfate (CARAFATE) 1 g tablet Take 1 tablet (1 g total) by mouth 4 (four) times daily as needed (for abdominal discomfort, nausea, and/or vomiting). 30 tablet 1  . SYNTHROID 50 MCG tablet TAKE 1 BY MOUTH DAILY. DO NOT SUBSTITUTE  0  . VENTOLIN HFA 108 (90 Base) MCG/ACT inhaler INHALE 2 PUFFS EVERY 6 HOURS AS NEEDED FOR SHORTNESS OF BREATH 18 each 2  . montelukast (SINGULAIR) 10 MG tablet Take 1 tablet (10 mg total) by mouth at bedtime. 30 tablet 2   No current facility-administered medications for this visit.    Allergies as of 09/26/2019 - Review Complete 09/26/2019  Allergen Reaction Noted  . Molds & smuts Anaphylaxis 04/07/2016  . Penicillins  Nausea And Vomiting and Rash 03/03/2015    ROS:  General: Negative for anorexia, weight loss, fever, chills, fatigue, weakness. ENT: Negative for hoarseness, difficulty swallowing , nasal congestion. CV: Negative for chest pain, angina, palpitations, dyspnea on exertion, peripheral edema.  Respiratory: Negative for dyspnea at rest, dyspnea on exertion, cough, sputum, wheezing.  GI: See history of present illness. GU:  Negative for dysuria, hematuria, urinary incontinence, urinary frequency, nocturnal urination.  Endo: Negative for unusual weight change.    Physical Examination:   BP 135/78   Pulse 63   Temp 97.8 F  (36.6 C) (Oral)   Ht _0  (1.676 m)   Wt 178 lb 6.4 oz (80.9 kg)   BMI 28.79 kg/m   General: Well-nourished, well-developed in no acute distress.  Eyes: No icterus. Conjunctivae pink. Lungs: Clear to auscultation bilaterally. Non-labored. Heart: Regular rate and rhythm, no murmurs rubs or gallops.  Abdomen: Bowel sounds are normal, nontender, nondistended, no hepatosplenomegaly or masses, no abdominal bruits or hernia , no rebound or guarding.  There is tenderness over the left flank left back and above the anterior superior iliac crest. Extremities: No lower extremity edema. No clubbing or deformities. Neuro: Alert and oriented x 3.  Grossly intact. Skin: Warm and dry, no jaundice.   Psych: Alert and cooperative, normal mood and affect.  Labs:    Imaging Studies: No results found.  Assessment and Plan:   Sonya Harper is a 74 y.o. y/o female who comes in today with flank pain and back pain on the left with some pain over the anterior superior iliac crest.  The patient's symptoms and pain is consistent with musculoskeletal and not related to eating drinking or bowel movements.  Although urinary symptoms may mimic this kind of pain I will hold off on doing a urinalysis since the patient states that she is seeing her urologist tomorrow.  The patient was also considering changing the practice she goes to since her doctor had left the practice but I have spoken to her and we are trying to get her an appointment with one of the physicians at [facility].  The patient has been explained the plan and agrees with it.     Lucilla Lame, MD. Marval Regal    Note: This dictation was prepared with Dragon dictation along with smaller phrase technology. Any transcriptional errors that result from this process are unintentional.

## 2019-10-04 ENCOUNTER — Ambulatory Visit: Payer: Private Health Insurance - Indemnity | Admitting: Internal Medicine

## 2019-10-14 ENCOUNTER — Other Ambulatory Visit: Payer: Self-pay

## 2019-10-14 ENCOUNTER — Ambulatory Visit: Payer: Medicare Other | Attending: Internal Medicine

## 2019-10-14 DIAGNOSIS — Z23 Encounter for immunization: Secondary | ICD-10-CM | POA: Insufficient documentation

## 2019-10-14 NOTE — Progress Notes (Signed)
   Covid-19 Vaccination Clinic  Name:  Sonya Harper    MRN: [id number] DOB: 1945-12-11  10/14/2019  [name] was observed post Covid-19 immunization for 30 minutes based on pre-vaccination screening without incidence. She was provided with Vaccine Information Sheet and instruction to access the V-Safe system.   [name] was instructed to call 911 with any severe reactions post vaccine: Marland Kitchen Difficulty breathing  . Swelling of your face and throat  . A fast heartbeat  . A bad rash all over your body  . Dizziness and weakness    Immunizations Administered    Name Date Dose VIS Date Route   Moderna COVID-19 Vaccine 10/14/2019 11:01 AM 0.5 mL 08/06/2019 Intramuscular   Manufacturer: Moderna   Lot: [id number]   [id number]

## 2019-10-17 NOTE — Progress Notes (Signed)
Name: Sonya Harper   MRN: [id number]    DOB: [birth year]   Date:10/18/2019       Progress Note  Subjective  Chief Complaint  No chief complaint on file. Notes needs to come n and be checked out with labs.   I connected with  Sonya Harper on 10/18/19 at 10:00 AM EST by telephone and verified that I am speaking with the correct person using two identifiers.  I discussed the limitations, risks, security and privacy concerns of performing an evaluation and management service by telephone and the availability of in person appointments. Staff also discussed with the patient that there may be a patient responsible charge related to this service. Patient Location: Home Provider Location: [facility] Additional Individuals present: none  HPI Patient is a 74 y.o. female who follows up today.  Patient was seen by Dr. Allen Norris for abdominal pain again on 09/26/19..  The patient had seen him back at the beginning of the year for abdominal pain.  The patient was having lower abdominal pain and a CT scan that showed possible antral gastritis.  The patient was also due for repeat colonoscopy as reported by her previous gastroenterologist.  The patient was set up for an EGD and colonoscopy and was found to have multiple colon polyps that were adenomas with a recommended repeat colonoscopy in 5 years.  The stomach showed chronic inactive gastritis without any ulcerations or active inflammation.  The patient had called the office stating that she was still having abdominal discomfort and was seen 09/26/19. She reported her pain to be in the back on her left side that radiates around the front to the area above her hip.The pain was felt consistent with musculoskeletal and not related to eating drinking or bowel movements.  Although urinary symptoms may mimic this kind of pain, they held off on doing a urinalysis since the patient stated that she was seeing her urologist tomorrow.  The patient was also considering  changing the practice she goes to since her doctor had left the practice but after speaking to her spoken to her, they got her an appointment with one of the physicians here at [facility] and she follows up today. Questioned a kidney stone by Dr. Eliberto Ivory office (urology) and not have a kidney stone, is supposed to follow-up again. Does have a cyst on the kidney.She thinks she will go back again.  Still having pain, "some", and a little different than prior. Not as bad and not her stomach and is lower and was in front going around to the back, and now just a little below her stomach and not in the back. She notes feels better presently.    She has a h/o a fib with her last visit with [facility] 07/19/2019. She was noted to have  paroxysmal atrial fibrillation (CHADSVASC 3; on apixaban and sotalol), HTN, HLD, hypothyroidism, asthma, and GERD. She was previously admitted to [facility] for sotalol drug load. During that hospitalization, DCCV was attempted twice (once on admission and the second after a couple of doses of sotalol), but was unsuccessful. Her dose of sotalol was increased to 120mg  Q12hr and she pharmacologically converted on that dose prior to discharge. She was also started on amlodipine for BP control. She did well after discharge, noted on the 11/13 visit that she still felt weak overall which has not improved substantially since before her hospitalization. She was still able to work and was not otherwise limited from her completing her  ADLs and iADLs. She occasionally noticed intermittent twinges of discomfort in her chest that randomly and not associated with exertion or activity. Otherwise, she denied any associated chest pain, exertional chest pressure/discomfort, dyspnea/tachypnea, paroxysmal nocturnal dyspnea/orthopnea, presyncope/syncope, lower extremity edema, or claudication. She had been taking her medications, including her apixaban, F/u planned in about 3 months Will make another  appt today with them.  Patient was last seen here at [facility] by Delsa Grana in October for an asthma exacerbation concern. She did  not want to take steroids again, (although noted when on prednisone last month she stopped coughing).  Treated with SABA prn, started singulair and a second generation antihistamine .Notes is better. She was seen for chronic cough prior and protonix initiated for possible GERD as well.  She saw endocrine 04/2019 for hypothyroidism. She takes levothyroxine with her TSH level therapeutic. Denied concerning sx's. She had thyroid nodules which were found to be stable on prior ultrasounds in 2019, 2018 and 2017. She has never had a biopsy of the thyroid gland. No plans for follow up routine neck ultrasounds were felt needed.Sees once a year and has labs checked.  Has lost some weight resent past, 15 pounds, plans on doing more. Walks more, hopes to get out and on a track to walk.   Had first vaccine shot for Covid, due for second soon.  Patient Active Problem List   Diagnosis Date Noted  . Polyp of transverse colon   . Polyp of ascending colon   . Chronic anticoagulation 09/12/2019  . Paroxysmal atrial fibrillation ([place]) 09/12/2019  . AKI (acute kidney injury) ([place]) 06/26/2019  . Grade I diastolic dysfunction [id number]  . Mild mitral regurgitation 06/26/2019  . Mild tricuspid regurgitation 06/26/2019  . Lumbar facet arthropathy 11/06/2018  . Prediabetes 02/27/2018  . Medication monitoring encounter 11/13/2017  . Abnormal laboratory test 10/16/2017  . Palpitations 08/23/2017  . Breast pain in female 08/10/2017  . Obesity (BMI 30.0-34.9) 08/10/2017  . Protein in urine 08/10/2017  . Vaginal atrophy 08/10/2017  . Benign essential HTN 11/16/2016  . Hyperlipidemia, mixed 11/16/2016  . SOBOE (shortness of breath on exertion) 11/16/2016  . Stable angina pectoris ([place]) 11/16/2016  . Dysrhythmia 07/07/2016  . Right low back pain 07/07/2016  . Fall on or from stairs or  steps 07/07/2016  . Family history of malignant neoplasm of gastrointestinal tract   . Benign neoplasm of ascending colon   . Benign neoplasm of transverse colon   . Benign neoplasm of descending colon   . Goiter 03/03/2015  . Asthma, mild persistent 03/03/2015  . Gastroesophageal reflux disease without esophagitis 03/03/2015    Past Surgical History:  Procedure Laterality Date  . ABDOMINAL HYSTERECTOMY    . COLONOSCOPY WITH PROPOFOL N/A 05/13/2016   Procedure: COLONOSCOPY WITH PROPOFOL;  Surgeon: Lucilla Lame, MD;  Location: [facility];  Service: Endoscopy;  Laterality: N/A;  . COLONOSCOPY WITH PROPOFOL N/A 09/20/2019   Procedure: COLONOSCOPY WITH PROPOFOL;  Surgeon: Lucilla Lame, MD;  Location: [facility] ENDOSCOPY;  Service: Endoscopy;  Laterality: N/A;  . ESOPHAGOGASTRODUODENOSCOPY (EGD) WITH PROPOFOL N/A 09/20/2019   Procedure: ESOPHAGOGASTRODUODENOSCOPY (EGD) WITH PROPOFOL;  Surgeon: Lucilla Lame, MD;  Location: [facility] ENDOSCOPY;  Service: Endoscopy;  Laterality: N/A;  . POLYPECTOMY N/A 05/13/2016   Procedure: POLYPECTOMY;  Surgeon: Lucilla Lame, MD;  Location: [place];  Service: Endoscopy;  Laterality: N/A;    Family History  Problem Relation Age of Onset  . Stroke Mother   . Breast cancer Sister 25  breast ca x3  . Colon cancer Paternal Aunt   . Cancer Sister     Social History   Socioeconomic History  . Marital status: Married    Spouse name: Not on file  . Number of children: Not on file  . Years of education: Not on file  . Highest education level: Not on file  Occupational History  . Not on file  Tobacco Use  . Smoking status: Never Smoker  . Smokeless tobacco: Never Used  Substance and Sexual Activity  . Alcohol use: No    Alcohol/week: 0.0 standard drinks  . Drug use: Never  . Sexual activity: Not Currently    Birth control/protection: Post-menopausal  Other Topics Concern  . Not on file  Social History Narrative  . Not on file   Social  Determinants of Health   Financial Resource Strain:   . Difficulty of Paying Living Expenses: Not on file  Food Insecurity:   . Worried About Charity fundraiser in the Last Year: Not on file  . Ran Out of Food in the Last Year: Not on file  Transportation Needs:   . Lack of Transportation (Medical): Not on file  . Lack of Transportation (Non-Medical): Not on file  Physical Activity:   . Days of Exercise per Week: Not on file  . Minutes of Exercise per Session: Not on file  Stress:   . Feeling of Stress : Not on file  Social Connections:   . Frequency of Communication with Friends and Family: Not on file  . Frequency of Social Gatherings with Friends and Family: Not on file  . Attends Religious Services: Not on file  . Active Member of Clubs or Organizations: Not on file  . Attends Archivist Meetings: Not on file  . Marital Status: Not on file  Intimate Partner Violence:   . Fear of Current or Ex-Partner: Not on file  . Emotionally Abused: Not on file  . Physically Abused: Not on file  . Sexually Abused: Not on file   Med list reviewed   Current Outpatient Medications:  .  amLODipine (NORVASC) 5 MG tablet, Take by mouth., Disp: , Rfl:  .  apixaban (ELIQUIS) 5 MG TABS tablet, Take by mouth., Disp: , Rfl:  .  atorvastatin (LIPITOR) 10 MG tablet, Take 1 tablet (10 mg total) by mouth every other day., Disp: 45 tablet, Rfl: 1 .  BREO ELLIPTA 100-25 MCG/INH AEPB, INHALE ONE DOSE BY MOUTH DAILY, Disp: 3 each, Rfl: 1 .  Cholecalciferol (VITAMIN D-1000 MAX ST) 25 MCG (1000 UT) tablet, Take by mouth., Disp: , Rfl:  .  fluticasone (FLONASE) 50 MCG/ACT nasal spray, Place 2 sprays into both nostrils daily., Disp: 16 g, Rfl: 6 .  levocetirizine (XYZAL) 5 MG tablet, Take 1 tablet (5 mg total) by mouth every evening., Disp: 30 tablet, Rfl: 2 .  metoprolol succinate (TOPROL-XL) 100 MG 24 hr tablet, , Disp: , Rfl:  .  pantoprazole (PROTONIX) 40 MG tablet, Take 1 tablet (40 mg total)  by mouth daily., Disp: 90 tablet, Rfl: 0 .  sotalol (BETAPACE) 120 MG tablet, Take by mouth., Disp: , Rfl:  .  sucralfate (CARAFATE) 1 g tablet, Take 1 tablet (1 g total) by mouth 4 (four) times daily as needed (for abdominal discomfort, nausea, and/or vomiting)., Disp: 30 tablet, Rfl: 1 .  SYNTHROID 50 MCG tablet, TAKE 1 BY MOUTH DAILY. DO NOT SUBSTITUTE, Disp: , Rfl: 0 .  tamsulosin (FLOMAX) 0.4 MG CAPS  capsule, Take 0.4 mg by mouth daily., Disp: , Rfl:  .  VENTOLIN HFA 108 (90 Base) MCG/ACT inhaler, INHALE 2 PUFFS EVERY 6 HOURS AS NEEDED FOR SHORTNESS OF BREATH, Disp: 18 each, Rfl: 2 .  EPINEPHrine (EPIPEN 2-PAK) 0.3 mg/0.3 mL IJ SOAJ injection, Use per package directions; one injection subcutaneously for LIFE-THREATENING emergency, deadly reaction (Patient not taking: Reported on 10/18/2019), Disp: 2 Device, Rfl: 1 .  nitroGLYCERIN (NITROSTAT) 0.4 MG SL tablet, Place 1 tablet (0.4 mg total) under the tongue every 5 (five) minutes as needed for chest pain. Max 3 pills per episode (Patient not taking: Reported on 10/18/2019), Disp: 50 tablet, Rfl: 3 .  ondansetron (ZOFRAN ODT) 4 MG disintegrating tablet, Allow 1-2 tablets to dissolve in your mouth every 8 hours as needed for nausea/vomiting (Patient not taking: Reported on 10/18/2019), Disp: 30 tablet, Rfl: 0  Allergies  Allergen Reactions  . Molds & Smuts Anaphylaxis  . Penicillins Nausea And Vomiting and Rash    Has patient had a PCN reaction causing immediate rash, facial/tongue/throat swelling, SOB or lightheadedness with hypotension: Yes Has patient had a PCN reaction causing severe rash involving mucus membranes or skin necrosis: No Has patient had a PCN reaction that required hospitalization: No Has patient had a PCN reaction occurring within the last 10 years: No If all of the above answers are "NO", then may proceed with Cephalosporin use.     With staff assistance, above reviewed with the patient today.  ROS: As per HPI, otherwise no  specific complaints on a limited and focused system review   Objective  Virtual encounter, vitals not obtained.  Body mass index is 31.76 kg/m.  Physical Exam   Appears in NAD via conversation, pleasant No obvious respiratory distress. Speaking in complete sentences Neurological: Pt is alert and oriented, Speech is normal Psychiatric: Patient has a normal mood and affect, behavior is normal. Judgment and thought content normal.   No results found for this or any previous visit (from the past 72 hour(s)).  PHQ2/9: Depression screen [facility] 2/9 10/18/2019 07/03/2019 05/10/2019 12/28/2018 12/25/2018  Decreased Interest 0 0 0 0 0  Down, Depressed, Hopeless 0 0 0 0 0  PHQ - 2 Score 0 0 0 0 0  Altered sleeping 0 0 0 0 0  Tired, decreased energy 1 0 0 0 0  Change in appetite 0 0 0 0 0  Feeling bad or failure about yourself  0 0 0 0 0  Trouble concentrating 0 0 0 0 0  Moving slowly or fidgety/restless 0 0 0 0 0  Suicidal thoughts 0 0 0 0 0  PHQ-9 Score 1 0 0 0 0  Difficult doing work/chores Not difficult at all Not difficult at all Not difficult at all Not difficult at all Not difficult at all  Some recent data might be hidden   PHQ-2/9 Result reviewed neg  Fall Risk: Fall Risk  10/18/2019 07/03/2019 05/10/2019 12/28/2018 12/25/2018  Falls in the past year? 0 1 0 1 1  Number falls in past yr: 0 1 0 0 0  Injury with Fall? 0 0 0 0 0  Risk Factor Category  - - - - -  Risk for fall due to : - - - - -  Follow up - - - Falls evaluation completed -    Reviewed her Dec labs. CBC ok, comp panel ok with BS 106 noted (prediabetes history)  Assessment & Plan  1. Left lower quadrant abdominal pain Work-up had been  with GI, but those notes reviewed, and also urology involved.  More recently, GI had concerns it was not gastrointestinal as the source.  She notes that her symptoms have significantly improved.  She stated in the past after a colonoscopy she was better from symptoms, and it seems like this  time was a similar case.  Limitations with this being a phone conversation and not being able to examine. It was agreed that if her pain again worsens, she will come in to be seen, and if remains better, we will follow-up at the latest in a couple months time in the office for a visit.  2. Paroxysmal atrial fibrillation [facility]) Seeing [facility] cardiology, and did recommend scheduling a follow-up, and she states she will.  Do feel having them about this helpful. Continue the sotalol and the apixaban presently.  3. Benign essential HTN We will continue the amlodipine that was added by cardiology for blood pressure control. Continue to monitor presently.  4. Mild persistent asthma without complication She notes this has remained good, after reviewed that last visit in October here for an exacerbation.  5. Gastroesophageal reflux disease without esophagitis This has remained controlled and continue current management.  GI has been involved with EGD showing chronic inactive gastritis without any ulcerations or active inflammation.  6. Prediabetes Continue with dietary recommendations for management, and also encouraged increasing her physical activity today as she has been trying to walk more. Plan to repeat labs again on follow-up in a couple months time.  This will include an A1c. Lab Results  Component Value Date   HGBA1C 5.9 (H) 08/30/2018    7. Obesity (BMI 30.0-34.9) She has noted success with some weight loss recently, and again emphasized diet modifications and watching portions and also increasing physical activity to help.  She noted she plans to continue to try to lose weight and encouraged that today.  Noted the positive benefits with that with her medical conditions.  8. Hyperlipidemia, mixed Continue the statin presently with diet modifications and exercise to help.  9. Chronic anticoagulation Continue the apixaban with her paroxysmal A. fib as per cardiology.  10. Goiter See  details above from the last endocrinology visit She had thyroid nodules which were found to be stable on prior ultrasounds in 2019, 2018 and 2017. She has never had a biopsy of the thyroid gland. No plans for follow up routine neck ultrasounds were felt needed. Sees once a year and has labs checked.  Plans to f/u in about 2 -3 months with an in office visit, to have labs checked again at that time and Hep C can be done then if not yet done. F/u sooner prn  I discussed the assessment and treatment plan with the patient. The patient was provided an opportunity to ask questions and all were answered. The patient agreed with the plan and demonstrated an understanding of the instructions.   I provided 25 minutes of non-face-to-face time during this encounter that included discussing at length patient's sx/history, pertinent pmhx, medications, treatment and follow up plan. This time also included the necessary documentation, orders, and chart review.  Towanda Malkin, MD

## 2019-10-18 ENCOUNTER — Ambulatory Visit (INDEPENDENT_AMBULATORY_CARE_PROVIDER_SITE_OTHER): Payer: Medicare Other | Admitting: Internal Medicine

## 2019-10-18 ENCOUNTER — Other Ambulatory Visit: Payer: Self-pay

## 2019-10-18 ENCOUNTER — Encounter: Payer: Self-pay | Admitting: Internal Medicine

## 2019-10-18 VITALS — Ht 64.0 in | Wt 185.0 lb

## 2019-10-18 DIAGNOSIS — K219 Gastro-esophageal reflux disease without esophagitis: Secondary | ICD-10-CM

## 2019-10-18 DIAGNOSIS — J4530 Mild persistent asthma, uncomplicated: Secondary | ICD-10-CM

## 2019-10-18 DIAGNOSIS — R1032 Left lower quadrant pain: Secondary | ICD-10-CM

## 2019-10-18 DIAGNOSIS — E049 Nontoxic goiter, unspecified: Secondary | ICD-10-CM

## 2019-10-18 DIAGNOSIS — E669 Obesity, unspecified: Secondary | ICD-10-CM

## 2019-10-18 DIAGNOSIS — I480 Paroxysmal atrial fibrillation: Secondary | ICD-10-CM

## 2019-10-18 DIAGNOSIS — R7303 Prediabetes: Secondary | ICD-10-CM

## 2019-10-18 DIAGNOSIS — I10 Essential (primary) hypertension: Secondary | ICD-10-CM

## 2019-10-18 DIAGNOSIS — E782 Mixed hyperlipidemia: Secondary | ICD-10-CM

## 2019-10-18 DIAGNOSIS — Z7901 Long term (current) use of anticoagulants: Secondary | ICD-10-CM

## 2019-10-21 ENCOUNTER — Ambulatory Visit: Payer: Medicare Other | Admitting: Gastroenterology

## 2019-11-03 ENCOUNTER — Other Ambulatory Visit: Payer: Self-pay | Admitting: Family Medicine

## 2019-11-03 DIAGNOSIS — R05 Cough: Secondary | ICD-10-CM

## 2019-11-03 DIAGNOSIS — R053 Chronic cough: Secondary | ICD-10-CM

## 2019-11-03 DIAGNOSIS — K2100 Gastro-esophageal reflux disease with esophagitis, without bleeding: Secondary | ICD-10-CM

## 2019-11-13 ENCOUNTER — Ambulatory Visit: Payer: Medicare Other | Attending: Internal Medicine

## 2019-11-13 DIAGNOSIS — Z23 Encounter for immunization: Secondary | ICD-10-CM

## 2019-11-13 NOTE — Progress Notes (Signed)
   Covid-19 Vaccination Clinic  Name:  [name]    MRN: [id number] DOB: [birth year]  11/13/2019  [name] was observed post Covid-19 immunization for 15 minutes without incident. She was provided with Vaccine Information Sheet and instruction to access the V-Safe system.   [name] was instructed to call 911 with any severe reactions post vaccine: Marland Kitchen Difficulty breathing  . Swelling of face and throat  . A fast heartbeat  . A bad rash all over body  . Dizziness and weakness   Immunizations Administered    Name Date Dose VIS Date Route   Moderna COVID-19 Vaccine 11/13/2019 10:57 AM 0.5 mL 08/06/2019 Intramuscular   Manufacturer: Moderna   Lot: [id number]   [id number]

## 2019-12-11 ENCOUNTER — Ambulatory Visit: Payer: Self-pay | Admitting: *Deleted

## 2019-12-11 NOTE — Telephone Encounter (Signed)
Please keep her in mind for cancellations tomorrow

## 2019-12-11 NOTE — Telephone Encounter (Signed)
Attempted to contact pt; left message on voicemail for pt to call office.

## 2019-12-11 NOTE — Telephone Encounter (Signed)
Pt called stating her BP is 186/?; this reading was obtain ed 12/10/19 at 2300; this is the only reading that she has; she denies SOB, dizziness, blurred vision; she has not missed any doses of her medication;the pt also states that she had some chest pain a while ago but none at present  the pt says that Dr Nancy Fetter, at [facility] was told that perhaps her PCP will give her something to supplement her BP medication; she is seen by Dr Lebron Conners, [place]; call dropped due to phone malfunction will attempt to contact pt; spoke with Meliss and she states that the pt will need to be seen; unable to complete nurse triage at this time; will attempt to contact pt; will also route to office for notification.Marland Kitchen  Answer Assessment - Initial Assessment Questions 1. BLOOD PRESSURE: "What is the blood pressure?" "Did you take at least two measurements 5 minutes apart?"     186/*? 2. ONSET: "When did you take your blood pressure?"    2300 12/10/19 3. HOW: "How did you obtain the blood pressure?" (e.g., visiting nurse, automatic home BP monitor)   Home cuff 4. HISTORY: "Do you have a history of high blood pressure?"   yes 5. MEDICATIONS: "Are you taking any medications for blood pressure?" "Have you missed any doses recently?"   no 6. OTHER SYMPTOMS: "Do you have any symptoms?" (e.g., headache, chest pain, blurred vision, difficulty breathing, weakness)     no 7. PREGNANCY: "Is there any chance you are pregnant?" "When was your last menstrual period?"     no  Protocols used: HIGH BLOOD PRESSURE-A-AH

## 2019-12-11 NOTE — Telephone Encounter (Signed)
Attempted to contact pt at her friend's home; no answer.

## 2019-12-11 NOTE — Telephone Encounter (Signed)
Blood pressure running 176/90 last night. Had a procedure done  in March and her heart rate went back and forth out of fib. Have another procedure schedule soon. Think her BP cuff is not working or to small. Spoke to Dr. Ruffin Frederick the Cardiologist nurse and they stated her BP medication need to be adjusted and her primary doctor will have to do. Patient informed to go to [facility] for BP check. If any cancellations for this week patient would like a call. Patient was informed to stop at a [facility] to check BP and call office with numbers. Patient verbalized understanding

## 2019-12-11 NOTE — Telephone Encounter (Signed)
Pt. Called back. Informed her she would need to be seen per Katrice's note and Melissa. No availability. Please advise pt. Contact number until 5 today is [phone].

## 2019-12-12 NOTE — Telephone Encounter (Signed)
Called and spoke to pt this morning to schedule with Dr Ancil Boozer. Pt states she cannot come in at the time available and that she is feeling better. Her sister is a Marine scientist and she states her BP has went back down.

## 2019-12-19 NOTE — Progress Notes (Signed)
Patient ID: Sonya Harper, female    DOB: 06-12-1946, 74 y.o.   MRN: [id number]  PCP: Sonya Malkin, MD  Chief Complaint  Patient presents with  . Follow-up  . Abdominal Pain    left lower quad pain-pt states much improved  . Hypertension  . Gastroesophageal Reflux  . Atrial Fibrillation    Subjective:   Sonya Harper is a 74 y.o. female, presents to clinic with CC of the following:  Chief Complaint  Patient presents with  . Follow-up  . Abdominal Pain    left lower quad pain-pt states much improved  . Hypertension  . Gastroesophageal Reflux  . Atrial Fibrillation    HPI:  Patient is a 74 year old female whom I first met via a telemedicine visit on 10/18/2019 with that note reviewed. She follows up today.   Left lower quadrant abdominal pain Work-up had been with GI, Dr. Eliberto Harper with those notes reviewed, and also urology involved.  More recently, GI had concerns it was not gastrointestinal as the source.   She notes that her symptoms have significantly improved.  She stated in the past after a colonoscopy she was better from symptoms, and it seems like this time was a similar case.  She notes that the symptoms have remained much improved.    Paroxysmal atrial fibrillation [facility]) Seeing [facility] cardiology, last saw November 22, 2019 and patient noted recommendations for the potential of catheter ablation for A. fib, and she was still thinking about it.  She was leaning towards having this done.  The note from her last visit was reviewed, with the recommendation as noted below.  She does feel like she wants to proceed with a catheter ablation, and I did feel that was a reasonable option. She notes she has intermittent symptoms still, with occasional chest pains and feelings of her heart rhythm being irregular. Continuing the sotalol and the apixaban presently.  We have discussed the options for management of atrial fibrillation including a rate control or rhythm  control strategy with either a drug based approach or with a catheter ablation approach. She remains symptomatic as discussed above in the history, and she is aware that the primary goal of rhythm control strategy is for the management of symptoms. We reinforced that there is no demonstrated benefit in terms of longevity or an amelioration of the need for anticoagulation. We have discussed pursuing catheter ablation for atrial fibrillation as a potentially more efficacious rhythm control option given the inadequate symptom control on sotalol and the patient has decided to proceed with ablation rather than pursue alternative anti-arrhythmic therapy. Risks of the procedure were discussed with Sonya Harper, including but not limited to vascular access injury, bleeding, hematoma, damage to the normal conduction system potentially requiring implantation of a permanent pacemaker, creation of substrate for atypical atrial flutters, pulmonary vein stenosis, phrenic nerve paralysis, stroke, myocardial infarction, cardiac perforation, tamponade, esophageal injury and death. Informed consents were signed and placed into the patient's record.     Benign essential HTN Meidcations reviewed -  amlodipine was added by cardiology for blood pressure control fairly recently and continues. Checks BP at home, but one at home not working well, and has not had many recent checks. No increased HA's, no vision problems, no lower extremity swelling. BP Readings from Last 3 Encounters:  12/20/19 138/86  09/26/19 135/78  09/20/19 112/82     Mild persistent asthma without complication She notes this has remained good, was seen in Oct  2020 for an exacerbation.   Gastroesophageal reflux disease without esophagitis This has remained controlled and continue current management. On PPI. GI has been involved with EGD showing chronic inactive gastritis without any ulcerations or active inflammation.   Prediabetes  Recent  Labs_0 Expand by Default       Lab Results  Component Value Date   HGBA1C 5.9 (H) 08/30/2018       Obesity (BMI 30.0-34.9) Wt Readings from Last 3 Encounters:  12/20/19 178 lb 8 oz (81 kg)  10/18/19 185 lb (83.9 kg)  09/26/19 178 lb 6.4 oz (80.9 kg)    She has noted success with some weight loss recently, and again emphasized diet modifications and watching portions and also increasing physical activity to help.  She noted she plans to continue to try to lose weight and encouraged that today.  Noted the positive benefits with that with her medical conditions.   Hyperlipidemia, mixed Lab Results  Component Value Date   CHOL 134 08/30/2018   HDL 49 (L) 08/30/2018   LDLCALC 72 08/30/2018   TRIG 54 08/30/2018   CHOLHDL 2.7 08/30/2018   Continue the statin presently with diet modifications and exercise to help, has been taking the statin only intermittently, as some concern for leg muscle discomfort noted. Did rec daily use today.   Chronic anticoagulation Continuing the apixaban with her paroxysmal A. fib as per cardiology.   Goiter She last saw endocrine 04/2019 for hypothyroidism. She takes levothyroxine with her TSH level therapeutic. Denied concerning sx's.  She had thyroid nodules which were found to be stable on prior ultrasounds in 2019, 2018 and 2017. She has never had a biopsy of the thyroid gland. No plans for follow up routine neck ultrasounds were felt needed. Sees once a year and has labs checked. Due again in August  Last CBC and comprehensive panel on 08/26/2019 were all normal with the only exception being a slightly high glucose at 106.   Patient Active Problem List   Diagnosis Date Noted  . Polyp of transverse colon   . Polyp of ascending colon   . Chronic anticoagulation 09/12/2019  . Paroxysmal atrial fibrillation ([place]) 09/12/2019  . AKI (acute kidney injury) ([place]) 06/26/2019  . Grade I diastolic dysfunction 40/98/1191  . Mild mitral regurgitation  06/26/2019  . Mild tricuspid regurgitation 06/26/2019  . Lumbar facet arthropathy 11/06/2018  . Prediabetes 02/27/2018  . Medication monitoring encounter 11/13/2017  . Abnormal laboratory test 10/16/2017  . Palpitations 08/23/2017  . Breast pain in female 08/10/2017  . Obesity (BMI 30.0-34.9) 08/10/2017  . Protein in urine 08/10/2017  . Vaginal atrophy 08/10/2017  . Benign essential HTN 11/16/2016  . Hyperlipidemia, mixed 11/16/2016  . SOBOE (shortness of breath on exertion) 11/16/2016  . Stable angina pectoris ([place]) 11/16/2016  . Dysrhythmia 07/07/2016  . Right low back pain 07/07/2016  . Fall on or from stairs or steps 07/07/2016  . Family history of malignant neoplasm of gastrointestinal tract   . Benign neoplasm of ascending colon   . Benign neoplasm of transverse colon   . Benign neoplasm of descending colon   . Goiter 03/03/2015  . Asthma, mild persistent 03/03/2015  . Gastroesophageal reflux disease without esophagitis 03/03/2015      Current Outpatient Medications:  .  amLODipine (NORVASC) 5 MG tablet, Take by mouth., Disp: , Rfl:  .  apixaban (ELIQUIS) 5 MG TABS tablet, Take by mouth., Disp: , Rfl:  .  atorvastatin (LIPITOR) 10 MG tablet, Take 1 tablet (10 mg  total) by mouth every other day., Disp: 45 tablet, Rfl: 1 .  BREO ELLIPTA 100-25 MCG/INH AEPB, INHALE ONE DOSE BY MOUTH DAILY, Disp: 3 each, Rfl: 1 .  Cholecalciferol (VITAMIN D-1000 MAX ST) 25 MCG (1000 UT) tablet, Take by mouth., Disp: , Rfl:  .  fluticasone (FLONASE) 50 MCG/ACT nasal spray, Place 2 sprays into both nostrils daily., Disp: 16 g, Rfl: 6 .  pantoprazole (PROTONIX) 40 MG tablet, Take 40 mg by mouth daily., Disp: , Rfl:  .  sotalol (BETAPACE) 120 MG tablet, Take by mouth., Disp: , Rfl:  .  sucralfate (CARAFATE) 1 g tablet, Take 1 tablet (1 g total) by mouth 4 (four) times daily as needed (for abdominal discomfort, nausea, and/or vomiting)., Disp: 30 tablet, Rfl: 1 .  SYNTHROID 50 MCG tablet, TAKE 1  BY MOUTH DAILY. DO NOT SUBSTITUTE, Disp: , Rfl: 0 .  VENTOLIN HFA 108 (90 Base) MCG/ACT inhaler, INHALE 2 PUFFS EVERY 6 HOURS AS NEEDED FOR SHORTNESS OF BREATH, Disp: 18 each, Rfl: 2 .  EPINEPHrine (EPIPEN 2-PAK) 0.3 mg/0.3 mL IJ SOAJ injection, Use per package directions; one injection subcutaneously for LIFE-THREATENING emergency, deadly reaction (Patient not taking: Reported on 10/18/2019), Disp: 2 Device, Rfl: 1 .  nitroGLYCERIN (NITROSTAT) 0.4 MG SL tablet, Place 1 tablet (0.4 mg total) under the tongue every 5 (five) minutes as needed for chest pain. Max 3 pills per episode (Patient not taking: Reported on 10/18/2019), Disp: 50 tablet, Rfl: 3 .  ondansetron (ZOFRAN ODT) 4 MG disintegrating tablet, Allow 1-2 tablets to dissolve in your mouth every 8 hours as needed for nausea/vomiting (Patient not taking: Reported on 10/18/2019), Disp: 30 tablet, Rfl: 0   Allergies  Allergen Reactions  . Molds & Smuts Anaphylaxis  . Penicillins Nausea And Vomiting and Rash    Has patient had a PCN reaction causing immediate rash, facial/tongue/throat swelling, SOB or lightheadedness with hypotension: Yes Has patient had a PCN reaction causing severe rash involving mucus membranes or skin necrosis: No Has patient had a PCN reaction that required hospitalization: No Has patient had a PCN reaction occurring within the last 10 years: No If all of the above answers are "NO", then may proceed with Cephalosporin use.      Past Surgical History:  Procedure Laterality Date  . ABDOMINAL HYSTERECTOMY    . COLONOSCOPY WITH PROPOFOL N/A 05/13/2016   Procedure: COLONOSCOPY WITH PROPOFOL;  Surgeon: Lucilla Lame, MD;  Location: [place];  Service: Endoscopy;  Laterality: N/A;  . COLONOSCOPY WITH PROPOFOL N/A 09/20/2019   Procedure: COLONOSCOPY WITH PROPOFOL;  Surgeon: Lucilla Lame, MD;  [facility] ENDOSCOPY;  Service: Endoscopy;  Laterality: N/A;  . ESOPHAGOGASTRODUODENOSCOPY (EGD) WITH PROPOFOL N/A 09/20/2019     Procedure: ESOPHAGOGASTRODUODENOSCOPY (EGD) WITH PROPOFOL;  Surgeon: Lucilla Lame, MD;  Location: [facility] ENDOSCOPY;  Service: Endoscopy;  Laterality: N/A;  . POLYPECTOMY N/A 05/13/2016   Procedure: POLYPECTOMY;  Surgeon: Lucilla Lame, MD;  Location: [place];  Service: Endoscopy;  Laterality: N/A;     Family History  Problem Relation Age of Onset  . Stroke Mother   . Breast cancer Sister 72       breast ca x3  . Colon cancer Paternal Aunt   . Cancer Sister      Social History   Tobacco Use  . Smoking status: Never Smoker  . Smokeless tobacco: Never Used  Substance Use Topics  . Alcohol use: No    Alcohol/week: 0.0 standard drinks    With staff assistance,  above reviewed with the patient today.  ROS: As per HPI, otherwise no specific complaints on a limited and focused system review   No results found for this or any previous visit (from the past 72 hour(s)).   PHQ2/9: Depression screen [facility] 2/9 12/20/2019 10/18/2019 07/03/2019 05/10/2019 12/28/2018  Decreased Interest 0 0 0 0 0  Down, Depressed, Hopeless 0 0 0 0 0  PHQ - 2 Score 0 0 0 0 0  Altered sleeping 0 0 0 0 0  Tired, decreased energy 2 1 0 0 0  Change in appetite 0 0 0 0 0  Feeling bad or failure about yourself  0 0 0 0 0  Trouble concentrating 0 0 0 0 0  Moving slowly or fidgety/restless 0 0 0 0 0  Suicidal thoughts 0 0 0 0 0  PHQ-9 Score 2 1 0 0 0  Difficult doing work/chores Not difficult at all Not difficult at all Not difficult at all Not difficult at all Not difficult at all  Some recent data might be hidden   PHQ-2/9 Result reviewed  Fall Risk: Fall Risk  12/20/2019 10/18/2019 07/03/2019 05/10/2019 12/28/2018  Falls in the past year? 0 0 1 0 1  Number falls in past yr: 0 0 1 0 0  Injury with Fall? 0 0 0 0 0  Risk Factor Category  - - - - -  Risk for fall due to : - - - - -  Follow up - - - - Falls evaluation completed      Objective:   Vitals:   12/20/19 0855  BP: 138/86  Pulse: 67  Resp:  16  Temp: 97.6 F (36.4 C)  TempSrc: Temporal  SpO2: 96%  Weight: 178 lb 8 oz (81 kg)  Height: _0  (1.626 m)    Body mass index is 30.64 kg/m.  Physical Exam   NAD, masked HEENT - [place]/AT, sclera anicteric, PERRL, EOMI, conj - non-inj'ed,  pharynx clear Neck - supple, no adenopathy, no TM, no palpable nodules, carotids 2+ and = without bruits bilat Car - RRR without m/g/r Pulm- RR and effort normal at rest, CTA without wheeze or rales Abd - soft, NT, ND, BS+,  no masses Back - no CVA tenderness Skin- no rash on exposed areas,  Ext - no LE edema,  Neuro/psychiatric - affect was not flat, appropriate with conversation  Alert and oriented  Grossly non-focal - good strength on testing extremities, sensation intact to LT in distal extremities, DTRs 2+ and equal in the patella, no pronator drift,  Speech and gait are normal       Assessment & Plan:   1. Left lower quadrant abdominal pain Remains much improved.  Had prior work-up involving both gastroenterology and urology noted.  Continue to monitor presently  2. Paroxysmal atrial fibrillation ([place]) She she noted today she is very much leaning towards getting the catheter ablation, I did review the last cardiology note as well.  Do feel that is reasonable.  If successful, it may help her lessen chronic anticoagulation needs over time. Continued follow-up with cardiology.  3. Benign essential HTN Blood pressure today was controlled, although is approaching borderline readings. Continue with cardiology input to help manage. Check labs today. - BASIC METABOLIC PANEL WITH GFR  4. Mild persistent asthma without complication Has been stable.  5. Gastroesophageal reflux disease without esophagitis Continue with her PPI. Did discuss concerns with longer-term chronic use of PPIs, in combination with the benefits if having persistent  reflux and lessening Barrett's esophagus developing which is a potentially precancerous  lesion. Noted if can have days away without symptoms, encouraged, but if is having symptoms, continue with the PPI  6. Prediabetes Recheck labs again today - Hemoglobin V6K - BASIC METABOLIC PANEL WITH GFR  7. Obesity (BMI 30.0-34.9) Encouraged in her goals of continuing to try to lose weight. She has noted success with some weight loss recently, and again emphasized diet modifications and watching portions and also increasing physical activity to help.  She noted. Noted the positive benefits with that with her medical conditions.  8. Hyperlipidemia, mixed Recommended taking the statin product daily.  As do feel it is beneficial for her. We will recheck a lipid panel today If is having increasing side effect concerns, she should follow-up - Lipid panel  9. Chronic anticoagulation Continue follow-up with cardiology, and await results with the catheter ablation that is planned.  10. Goiter Continues to follow-up yearly with endocrine.  We will tentatively schedule a follow-up again in 4 months, can follow-up sooner as needed. Await lab test results ordered today.     Sonya Malkin, MD 12/20/19 9:07 AM

## 2019-12-20 ENCOUNTER — Ambulatory Visit (INDEPENDENT_AMBULATORY_CARE_PROVIDER_SITE_OTHER): Payer: Medicare Other | Admitting: Internal Medicine

## 2019-12-20 ENCOUNTER — Other Ambulatory Visit: Payer: Self-pay

## 2019-12-20 ENCOUNTER — Encounter: Payer: Self-pay | Admitting: Internal Medicine

## 2019-12-20 VITALS — BP 138/86 | HR 67 | Temp 97.6°F | Resp 16 | Ht 64.0 in | Wt 178.5 lb

## 2019-12-20 DIAGNOSIS — R1032 Left lower quadrant pain: Secondary | ICD-10-CM | POA: Diagnosis not present

## 2019-12-20 DIAGNOSIS — E669 Obesity, unspecified: Secondary | ICD-10-CM

## 2019-12-20 DIAGNOSIS — I10 Essential (primary) hypertension: Secondary | ICD-10-CM

## 2019-12-20 DIAGNOSIS — J4530 Mild persistent asthma, uncomplicated: Secondary | ICD-10-CM

## 2019-12-20 DIAGNOSIS — E049 Nontoxic goiter, unspecified: Secondary | ICD-10-CM

## 2019-12-20 DIAGNOSIS — R7303 Prediabetes: Secondary | ICD-10-CM

## 2019-12-20 DIAGNOSIS — E782 Mixed hyperlipidemia: Secondary | ICD-10-CM

## 2019-12-20 DIAGNOSIS — Z7901 Long term (current) use of anticoagulants: Secondary | ICD-10-CM

## 2019-12-20 DIAGNOSIS — K219 Gastro-esophageal reflux disease without esophagitis: Secondary | ICD-10-CM

## 2019-12-20 DIAGNOSIS — I480 Paroxysmal atrial fibrillation: Secondary | ICD-10-CM

## 2019-12-21 LAB — BASIC METABOLIC PANEL WITH GFR
BUN: 15 mg/dL (ref 7–25)
CO2: 27 mmol/L (ref 20–32)
Calcium: 9.3 mg/dL (ref 8.6–10.4)
Chloride: 106 mmol/L (ref 98–110)
Creat: 0.86 mg/dL (ref 0.60–0.93)
GFR, Est African American: 78 mL/min/{1.73_m2} (ref >=60)
GFR, Est Non African American: 67 mL/min/{1.73_m2} (ref >=60)
Glucose, Bld: 99 mg/dL (ref 65–99)
Potassium: 4.2 mmol/L (ref 3.5–5.3)
Sodium: 140 mmol/L (ref 135–146)

## 2019-12-21 LAB — HEMOGLOBIN A1C
Hgb A1c MFr Bld: 5.8 % of total Hgb — ABNORMAL HIGH (ref <5.7)
Mean Plasma Glucose: 120 (calc)
eAG (mmol/L): 6.6 (calc)

## 2019-12-21 LAB — LIPID PANEL
Cholesterol: 149 mg/dL (ref <200)
HDL: 51 mg/dL (ref >=50)
LDL Cholesterol (Calc): 84 mg/dL (calc)
Non-HDL Cholesterol (Calc): 98 mg/dL (calc) (ref <130)
Total CHOL/HDL Ratio: 2.9 (calc) (ref <5.0)
Triglycerides: 63 mg/dL (ref <150)

## 2019-12-25 ENCOUNTER — Telehealth: Payer: Self-pay

## 2019-12-25 NOTE — Telephone Encounter (Signed)
I called the patient, no answer, left a voicemail to return call. Labs results sent to [facility] for permission to give patient results if she calls after hours.   Copied from [place] [phone]. Topic: General - Inquiry >> Dec 25, 2019  8:46 AM Richardo Priest, NT wrote: Reason for CRM: Patient returning call in regards to lab work. Please advise.

## 2019-12-27 NOTE — Telephone Encounter (Signed)
Patient notified of results and recommendations listed below:  Please let patient know her lab results were all good. A1c about the same - 5.8, still c/w prediabetes but stable. BMP all normal including her blood sugar at 99. Lipid panel good. Her LDL chol did slightly increase to above 70 (84). Please encourage her to take her statin daily as was recommended during her visit.  Thanks, [facility]

## 2020-01-13 ENCOUNTER — Telehealth: Payer: Self-pay | Admitting: Internal Medicine

## 2020-01-13 NOTE — Telephone Encounter (Signed)
Left message for patient to schedule Annual Wellness Visit.  Please schedule with Nurse Health Advisor KASEY UTHUS, RN.   

## 2020-01-21 ENCOUNTER — Telehealth: Payer: Self-pay | Admitting: Internal Medicine

## 2020-01-21 NOTE — Chronic Care Management (AMB) (Signed)
  Chronic Care Management   Outreach Note  01/21/2020 Name: NYHLA SALTER MRN: [id number] DOB: Jan 24, 1946  GABBANELLI PETILLO is a 74 y.o. year old female who is a primary care patient of Towanda Malkin, MD. I reached out to [name] by phone today in response to a referral sent by Ms. Margie Billet [place]'s health plan.     An unsuccessful telephone outreach was attempted today. The patient was referred to the case management team for assistance with care management and care coordination.   Follow Up Plan: A HIPPA compliant phone message was left for the patient providing contact information and requesting a return call.  The care management team will reach out to the patient again over the next 7 days.  If patient returns call to provider office, please advise to call [facility]  at [place], [place], [place], [place] [address] Direct Dial: [phone] [email or web address] Website: [email or web address]

## 2020-01-24 NOTE — Chronic Care Management (AMB) (Signed)
  Chronic Care Management   Outreach Note  01/24/2020 Name: Sonya Harper MRN: [id number] DOB: 08/02/1946  Sonya Harper is a 74 y.o. year old female who is a primary care patient of Towanda Malkin, MD. I reached out to [name] by phone today in response to a referral sent by Ms. Margie Billet [place]'s health plan.     A second unsuccessful telephone outreach was attempted today. The patient was referred to the case management team for assistance with care management and care coordination.   Follow Up Plan: A HIPPA compliant phone message was left for the patient providing contact information and requesting a return call.  The care management team will reach out to the patient again over the next 7 days.  If patient returns call to provider office, please advise to call [facility] at [address] [place], [address], [place], [place] [address] Direct Dial: [phone] [email or web address] Website: [email or web address]

## 2020-01-27 NOTE — Chronic Care Management (AMB) (Signed)
  Chronic Care Management   Note  01/27/2020 Name: Sonya Harper MRN: [id number] DOB: [birth year]  Sonya Harper is a 74 y.o. year old female who is a primary care patient of Towanda Malkin, MD. I reached out to [name] by phone today in response to a referral sent by [name]'s health plan.     Sonya Harper was given information about Chronic Care Management services today including:  1. CCM service includes personalized support from designated clinical staff supervised by her physician, including individualized plan of care and coordination with other care providers 2. 24/7 contact phone numbers for assistance for urgent and routine care needs. 3. Service will only be billed when office clinical staff spend 20 minutes or more in a month to coordinate care. 4. Only one practitioner may furnish and bill the service in a calendar month. 5. The patient may stop CCM services at any time (effective at the end of the month) by phone call to the office staff. 6. The patient will be responsible for cost sharing (co-pay) of up to 20% of the service fee (after annual deductible is met).  Patient did not agree to enrollment in care management services and does not wish to consider at this time.  Follow up plan: The patient has been provided with contact information for the care management team and has been advised to call with any health related questions or concerns.   Noreene Larsson, [place], [place], [place] [address] Direct Dial: [phone] [email or web address] Website: [email or web address]

## 2020-03-11 NOTE — Progress Notes (Deleted)
Patient is a 74 year old female who I last saw 12/20/2019 She has subsequently followed up with cardiology since Follows up today.    Paroxysmal atrial fibrillation ([place]) Seeing [facility] cardiology, with catheter ablation versus medication based approach presented She notes she has intermittent symptoms still, with occasional chest pains and feelings of her heart rhythm being irregular. Continuing the sotalol and the apixaban presently.  We have discussed the options for management of atrial fibrillation including a rate control or rhythm control strategy with either a drug based approach or with a catheter ablation approach. She remains symptomatic as discussed above in the history, and she is aware that the primary goal of rhythm control strategy is for the management of symptoms. We reinforced that there is no demonstrated benefit in terms of longevity or an amelioration of the need for anticoagulation. We have discussed pursuing catheter ablation for atrial fibrillation as a potentially more efficacious rhythm control option given the inadequate symptom control on sotalol and the patient has decided to proceed with ablation rather than pursue alternative anti-arrhythmic therapy. Risks of the procedure were discussed with Ms. Saldarriaga, including but not limited to vascular access injury, bleeding, hematoma, damage to the normal conduction system potentially requiring implantation of a permanent pacemaker, creation of substrate for atypical atrial flutters, pulmonary vein stenosis, phrenic nerve paralysis, stroke, myocardial infarction, cardiac perforation, tamponade, esophageal injury and death. Informed consents were signed and placed into the patient's record.   From a telephone encounter 03/02/2020, it appears that the catheter ablation has been scheduled for September 2021: Telephone Encounter - Daubert, Sherrie George, MD - 03/02/2020 4:49 PM EDT Formatting of this note might be different from the  original. PRE-PROCEDURE DETAILS - AF ABLATION  1) AF Ablation Indication and CPT codes:  AF catheter ablation is useful for symptomatic paroxysmal AF refractory or intolerant to at least 1 class I or III antiarrhythmic medication when a rhythm-control strategy is desired. (I o A) Base procedure: Comprehensive EP eval and treatment of AF, PVI (CPT 93656) Intracardiac Echocardiography (CPT [phone]) Intracardiac 3-D Mapping (CPT [phone]): CARTO  2) Patient Care Team: Shiela Mayer, MD as PCP - General (Internal Medicine) Shiela Mayer, MD (Internal Medicine) Jobe Gibbon, MD as Consulting Provider (Cardiovascular Disease)  3) Procedure Date: 26 May 2020  4) Follow-up appointment: Please schedule 3-9 weeks post-procedure with an EP Advanced Practice Provider (if possible the one most recently seen)  5) Admission Status: Observation  Procedure Status: elective  Anesthesia Plan: General anesthesia  Radial arterial line: Yes if suitable per anesthesia team  The ablation strategy will be:de novo wide antral pulmonary vein isolation (and possibly any observed likely clinically relevant AT/AFL)  Possible need for TEE: Do not schedule a TEE pre ablation; the patient is on uninterrupted DOAC  Please confirm patient is taking DOAC or warfarin as prescribed.   Pre-procedure Imaging: A cardiac CT scan will be obtained to evaluate the morphology and function of the left atrium, right atrium, and coronary sinus as well as to evaluate the pulmonary vasculature for use with an electro-anatomical mapping system.  Preoperative Evaluation: Clinic 1D - Preop Visit Needed   Preoperative labs: To be done at pre-operative Visit  covid, Basic Metabolic Profile, CBC, PT with INR, Urinalysis, aPTT, Magnesium, Type and Screen and 12-lead ECG  Contrast Allergy Premedication: No premedication necessary   Pre-ablation medication plans:  Anticoagulant Plan  DO NOT  INTERRUPT apixaban-- take PM dose one calendar day prior to procedure as usual and then  take AM dose at 5AM on day of ablation  Additional medications to hold Sotalol LD AM one calendar day prior to ablation  Diabetic agents  If on metformin hold on day of operation  If on glipizide or glyburide hold on day of operation  If on evening long or intermediate-acting insulin give half dose one calendar day in evening before the operation  If on morning long or intermediate-acting insulin give half dose on day of the operation  Post-ablation medication plan is:  Continue anti-coagulation uninterrupted  Restart recent antiarrhythmic  Colchicine unless medication interaction  PPI x 30d    Benign essential HTN Meidcations reviewed -  amlodipine was added by cardiology for blood pressure control fairly recently and continues. Checks BP at home, but one at home not working well, and has not had many recent checks. No increased HA's, no vision problems, no lower extremity swelling.    BP Readings from Last 3 Encounters:  12/20/19 138/86  09/26/19 135/78  09/20/19 112/82     Mild persistent asthma without complication She notes this has remained good, was seen in Oct 2020 for an exacerbation.   Gastroesophageal reflux disease without esophagitis This has remained controlled and continue current management. On PPI. GI has been involved with EGD showingchronic inactive gastritis without any ulcerations or active inflammation.   Prediabetes  Recent Labs[] ?Expand by Default       Lab Results  Component Value Date   HGBA1C 5.9 (H) 08/30/2018       Obesity (BMI 30.0-34.9)    Wt Readings from Last 3 Encounters:  12/20/19 178 lb 8 oz (81 kg)  10/18/19 185 lb (83.9 kg)  09/26/19 178 lb 6.4 oz (80.9 kg)    She has noted success with some weight loss recently, and again emphasized diet modifications and watching portions and also increasing physical activity to  help. She noted she plans to continue to try to lose weight and encouraged that today. Noted the positive benefits with that with her medical conditions.   Hyperlipidemia, mixed Recent Labs       Lab Results  Component Value Date   CHOL 134 08/30/2018   HDL 49 (L) 08/30/2018   LDLCALC 72 08/30/2018   TRIG 54 08/30/2018   CHOLHDL 2.7 08/30/2018     Continue the statin presently with dietmodificationsand exercise to help, has been taking the statin only intermittently, as some concern for leg muscle discomfort noted. Did rec daily use today.   Chronic anticoagulation Continuing the apixaban with her paroxysmal A. fib as per cardiology.   Goiter She last saw endocrine 04/2019 for hypothyroidism.She takes levothyroxinewith her TSH leveltherapeutic. Denied concerning sx's. She hadthyroid nodules which were found to be stable on prior ultrasounds in 2019, 2018 and 2017. She has never had a biopsy of the thyroid gland. No plans for follow up routine neck ultrasounds were felt needed. Sees once a year and has labs checked. Due again in August  Last CBC and comprehensive panel on 08/26/2019 were all normal with the only exception being a slightly high glucose at 106.

## 2020-03-12 ENCOUNTER — Ambulatory Visit: Payer: Medicare Other | Admitting: Internal Medicine

## 2020-03-18 IMAGING — CT CT ABD-PELV W/ CM
2 of 5 series · 16 of 46 positions shown, 18 images · IV contrast (APPLIED)
Comparison: February 14, 2010

CLINICAL DATA: Nausea vomiting lower abdominal pain

EXAM:
CT ABDOMEN AND PELVIS WITH CONTRAST
TECHNIQUE: Multidetector CT imaging of the abdomen and pelvis was performed
using the standard protocol following bolus administration of
intravenous contrast.
CONTRAST:  100mL OMNIPAQUE IOHEXOL 300 MG/ML  SOLN

[Series 2: routine abd/pel with · axial · 0.84mm/px · z∈[-871,-476]mm · 13 of 89 slices shown, 15 images]
[im 5/89  soft-tissue]
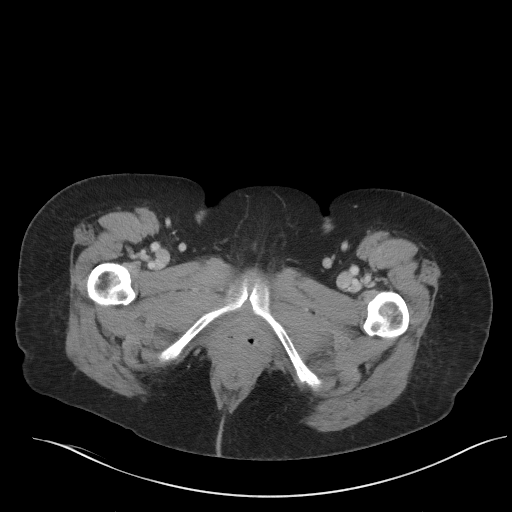
[im 5/89  bone]
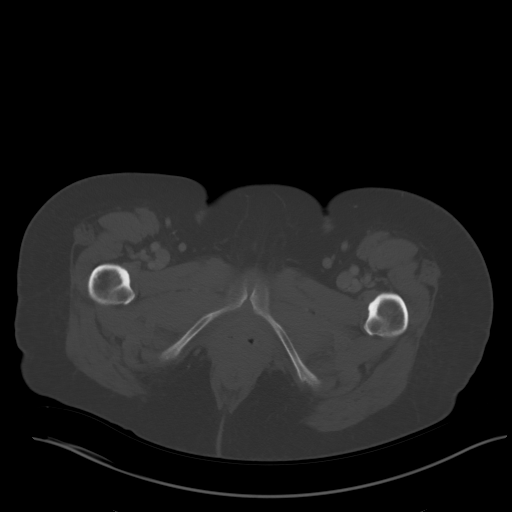
[im 14/89  soft-tissue]
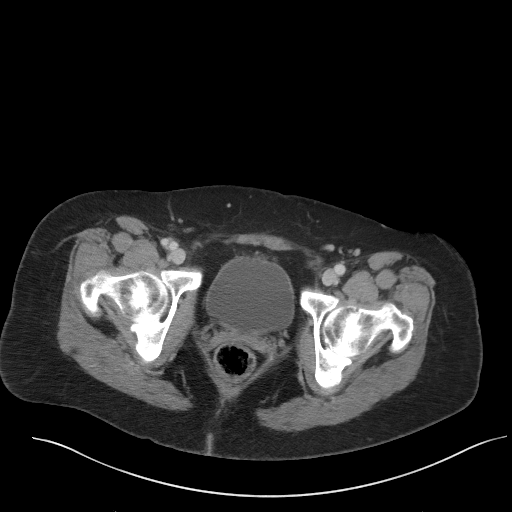
[im 19/89  soft-tissue]
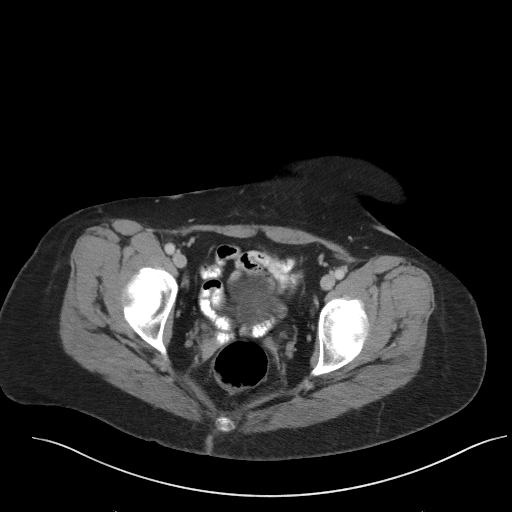
[im 24/89  soft-tissue]
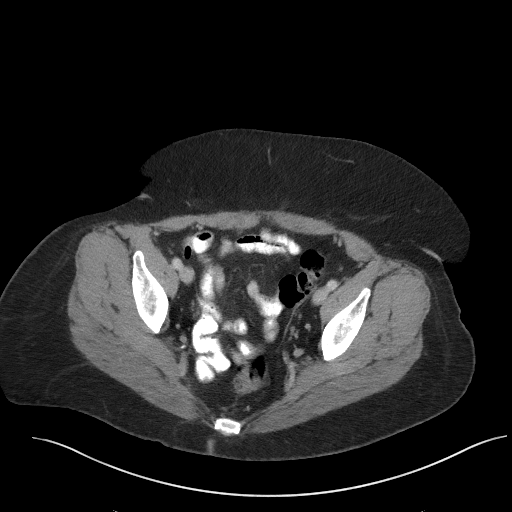
[im 33/89  soft-tissue]
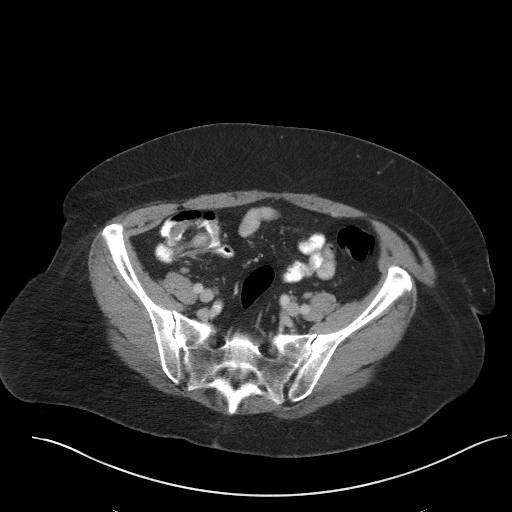
[im 38/89  soft-tissue]
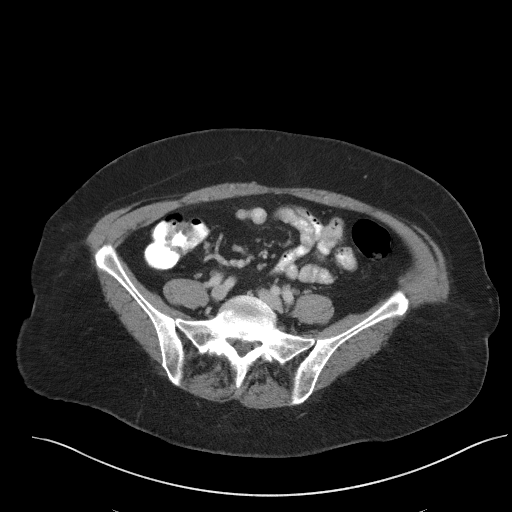
[im 47/89  soft-tissue]
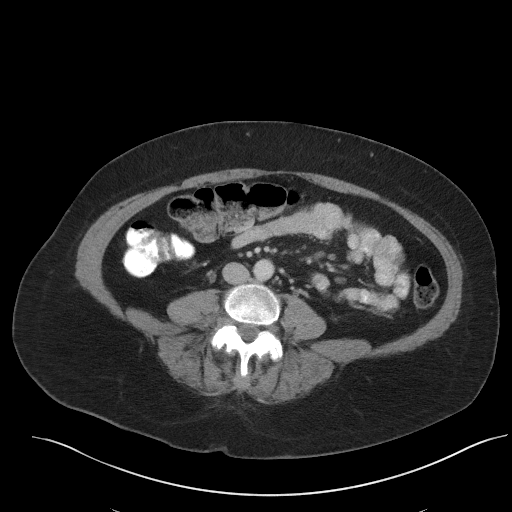
[im 51/89  soft-tissue]
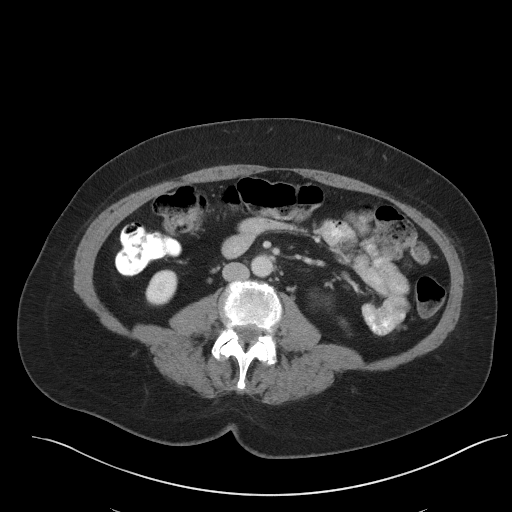
[im 56/89  soft-tissue]
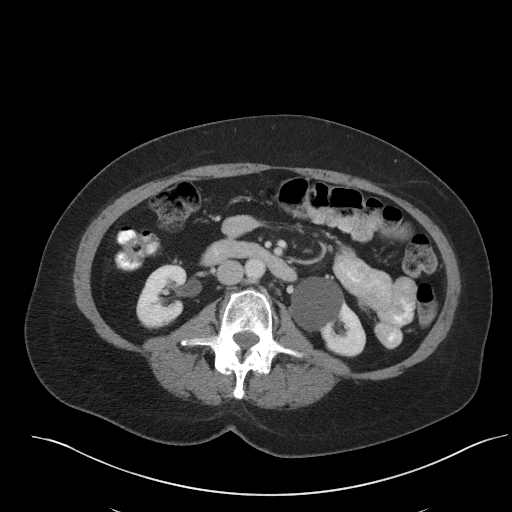
[im 56/89  bone]
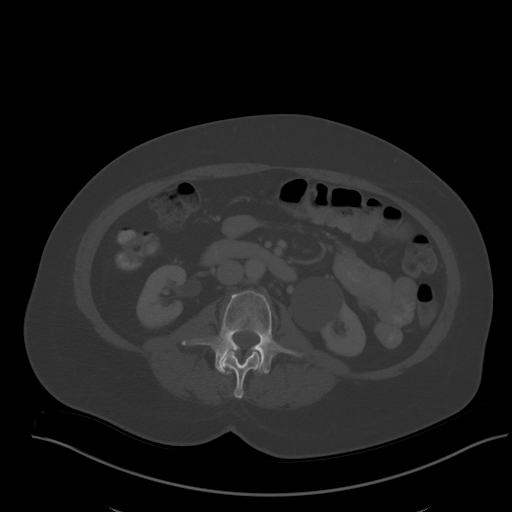
[im 65/89  soft-tissue]
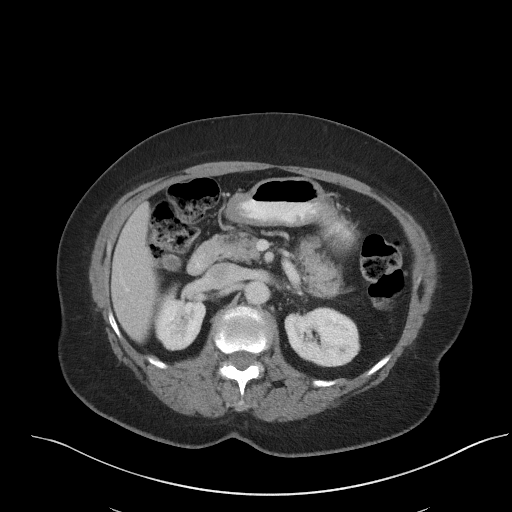
[im 70/89  soft-tissue]
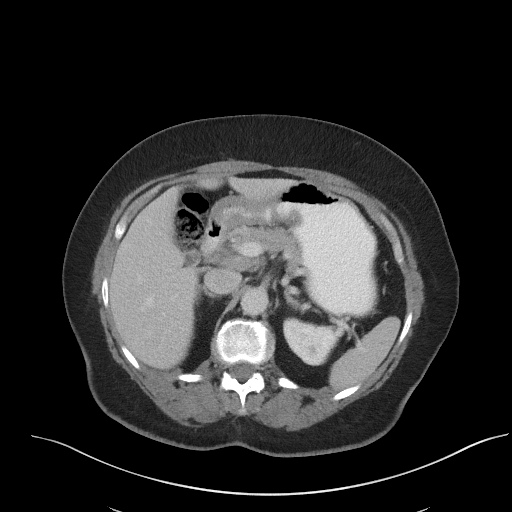
[im 75/89  soft-tissue]
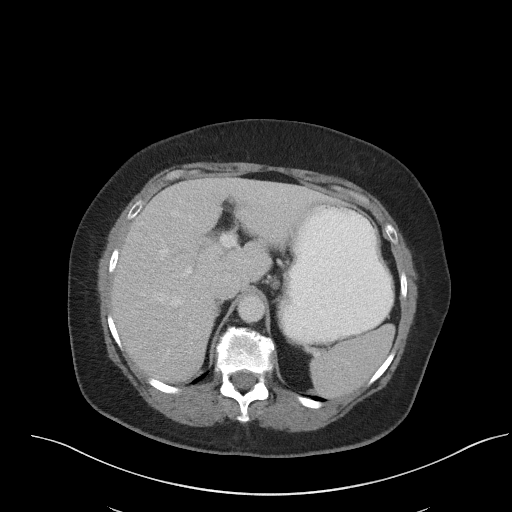
[im 84/89  soft-tissue]
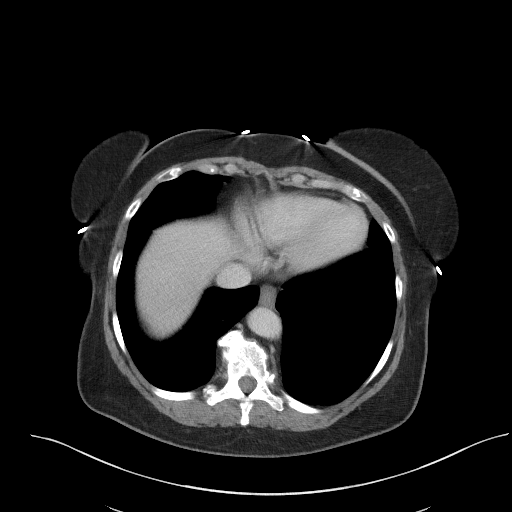

[Series 5: coronal st · coronal · 0.78mm/px · 3 of 91 slices shown]
[im 31/91  soft-tissue]
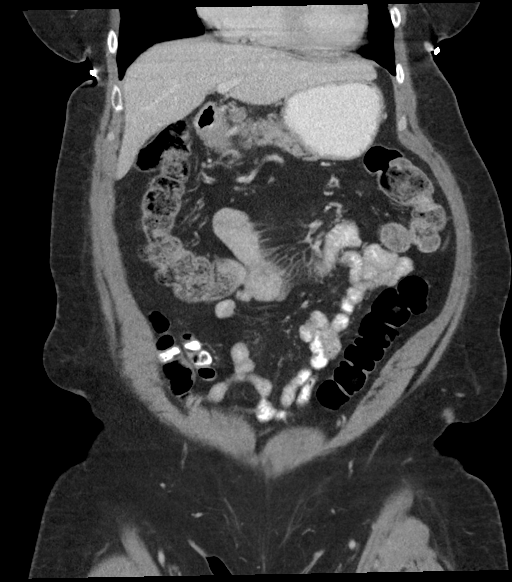
[im 41/91  soft-tissue]
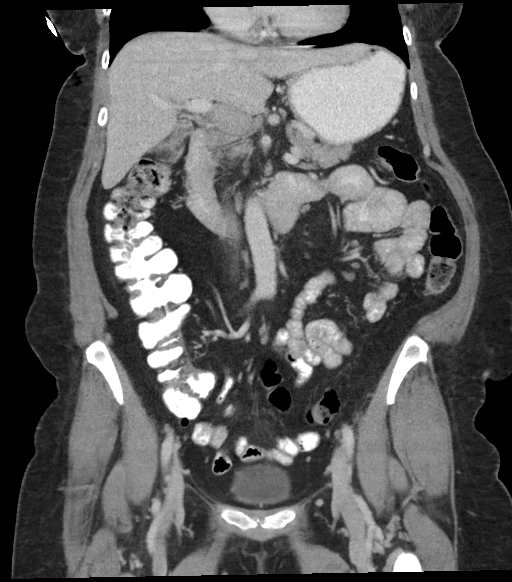
[im 51/91  soft-tissue]
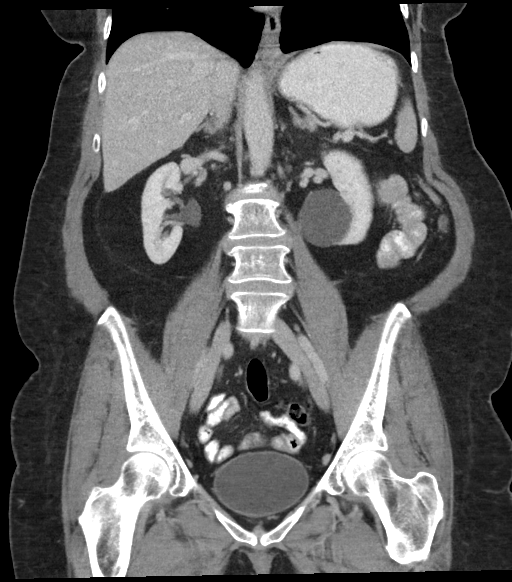

[16 of 46 positions shown; findings below may reference images not displayed]

FINDINGS: Lower chest: The visualized heart size within normal limits. No
pericardial fluid/thickening.

No hiatal hernia.

The visualized portions of the lungs are clear.

Hepatobiliary: Tiny 1 cm low-density lesion in the posterior right
liver lobe as on prior exam, likely hepatic cyst. The main portal
vein is patent. No evidence of calcified gallstones, gallbladder
wall thickening or biliary dilatation.

Pancreas: Unremarkable. No pancreatic ductal dilatation or
surrounding inflammatory changes.

Spleen: Normal in size without focal abnormality.

Adrenals/Urinary Tract: Both adrenal glands appear normal. There is
a 3 cm low-density lesion seen off the lower pole the left kidney,
likely renal cyst. There is a 1 cm low-density lesion seen in the
lower pole the right kidney. No hydronephrosis. Bladder is
unremarkable.

Stomach/Bowel: There is mild circumferential wall thickening seen
within the distal stomach/pylorus. No significant surrounding fat
stranding changes however are seen. No inflammatory changes, wall
thickening, or obstructive findings.The appendix is normal.

Vascular/Lymphatic: There are no enlarged mesenteric,
retroperitoneal, or pelvic lymph nodes. No significant vascular
findings are present.

Reproductive: The patient is status post hysterectomy.

Other: No evidence of abdominal wall mass or hernia.

Musculoskeletal: No acute or significant osseous findings.
IMPRESSION: Distal stomach /pylorus wall thickening which could be due to
gastritis.

## 2020-04-22 NOTE — Progress Notes (Signed)
Patient ID: Sonya Harper, female    DOB: [birth year], 74 y.o.   MRN: [id number]  PCP: Towanda Malkin, MD  Chief Complaint  Patient presents with  . Follow-up  . Cough    patient says once a year she starts coughing and it doesn't unless she takesa medication for it like a cough medication    Subjective:   Sonya Harper is a 74 y.o. female, presents to clinic with CC of the following:  Chief Complaint  Patient presents with  . Follow-up  . Cough    patient says once a year she starts coughing and it doesn't unless she takesa medication for it like a cough medication    HPI:  Patient is a 74 year old female Last visit with me was in April 2021 Follows up today. Her main concern today is this recent persistent cough.  I first met via a telemedicine visit on 10/18/2019 with that note reviewed, and her abdominal pain symptoms continue to remain improved   Paroxysmal atrial fibrillation [facility]) She saw Dr. Lurene Shadow at [facility] cardiology for a second opinion on 12/30/19 with the following impression and discussion noted: IMPRESSION:   1) paroxysmal and persistent atrial fibrillation: Episodes occurring despite sotalol 120 twice daily. She had episodes in March 19 and again on Easter weekend. She has moderately severe symptoms during the atrial fibrillation. She is seen as a second opinion in electrophysiology having recently seen Dr. Teena Dunk in part because her brother-in-law had catheter ablation by me.  2) stroke risk on apixaban  3) sotalol therapy: QTC well under 500; doubt feasibility of increased dose however  Discussion: We discussed alternative antiarrhythmic drugs and catheter ablation pros and cons and risks of ablation nerve drugs as well. Dofetilide could be an option but I doubt this would be more efficacious than sotalol. We repeated the stress test flecainide could be considered. Dronedarone might be an option. We talked about amiodarone and potential  long-term toxicity. We reviewed the risks of catheter ablation including stroke, cardiac tamponade etc. and the likelihood. Discussed that there is a chance she could discontinue sotalol but not apixaban after the ablation. She is frightened of the risks of complications and not ready to make a decision yet. She and her 2 daughters asked excellent questions. We discussed cardio monitor that could confirm she is not having other episodes and document her heart rate during the episodes of atrial fibrillation and decided we would proceed with a telehealth visit in 2 months to see how often she is having recurrent episodes and decide how to proceed at that point  She noted today that she did decide to proceed with the ablation procedure, scheduled for  05/26/20.   Continuing the sotalol and the apixaban presently.    Benign essential HTN Medication regimen -  amlodipine Can check BP at home, not recently done  BP Readings from Last 3 Encounters:  04/24/20 136/84  12/20/19 138/86  09/26/19 135/78    Notes has occas chest pains/palpitations "since a little girl" and nothing new of concern, no increased shortness of breath, increased lower extremity swelling, increased headaches, vision changes (although notes she has a cataract she needs to get taken care of)   Mild persistent asthma without complication She notes this has remained stable, although the cough increased recently has been an issue.  She notes she does not use an inhaler routinely presently, although would like a refill of her [employer] as she can get that  now at a low cost from that company that makes it. She noted that was helpful for her in the past. Denies marked wheezing.  COUGH -she notes she has had a persistent intermittent cough now, daily lately  NP, dry cough mostly and if does is white, no discolored mucus. No loss of taste or smell No F, No N/V, except if coughs after eat, sometimes food can come up, rarely + Postnasal  drip during the day, no sinus tenderness, no sore throat Noted this happens yearly, and a cough medicine with codeine used in the past helps and has been helpful.  She notes she does okay with this.  Discussed some hesitancy using this product, although she notes she has done very well with this previously. Notes cough keeping up at night in the very recent past. Not using Flonase in recent past.   Gastroesophageal reflux disease without esophagitis Medication regimen-on protonix This has remained controlled.  No recent increase symptoms of concern as noted this can cause cough as well GI has been involved with EGD showingchronic inactive gastritis without any ulcerations or active inflammation.   Prediabetes  Lab Results  Component Value Date   HGBA1C 5.8 (H) 12/20/2019   HGBA1C 5.9 (H) 08/30/2018   HGBA1C 6.0 (H) 02/26/2018   Lab Results  Component Value Date   MICROALBUR 0.9 10/13/2017   LDLCALC 84 12/20/2019   CREATININE 0.86 12/20/2019      Obesity (BMI 30.0-34.9) Wt Readings from Last 3 Encounters:  04/24/20 178 lb 12.8 oz (81.1 kg)  12/20/19 178 lb 8 oz (81 kg)  10/18/19 185 lb (83.9 kg)    weight stable recent past,     Hyperlipidemia, mixed Medication regimen-a statin, Lipitor, has been taking intermittently as had some concerns with some leg muscle discomfort, and daily use was recently recommended  Takes medication now every other day "sometimes", rec'ed more frequent use today. Lab Results  Component Value Date   CHOL 149 12/20/2019   HDL 51 12/20/2019   LDLCALC 84 12/20/2019   TRIG 63 12/20/2019   CHOLHDL 2.9 12/20/2019   Denies increased myalgias   Chronic anticoagulation Continuing the apixaban with her paroxysmal A. fib as per cardiology.   Goiter Followed by endocrine-last saw endocrine 04/2019 for hypothyroidism, has follow-up visit 05/01/2020. She takes levothyroxinewith her TSH leveltherapeutic.  She hadthyroid nodules which were  found to be stable on prior ultrasounds in 2019, 2018 and 2017. She has never had a biopsy of the thyroid gland. No plans for follow up routine neck ultrasounds were felt needed. Sees once a year and has labs checked.   Last CBC and comprehensive panel on 08/26/2019 were all normal with the only exception being a slightly high glucose at 106.  BMP all normal 12/20/19  Patient Active Problem List   Diagnosis Date Noted  . Polyp of transverse colon   . Polyp of ascending colon   . Chronic anticoagulation 09/12/2019  . Paroxysmal atrial fibrillation ([place]) 09/12/2019  . AKI (acute kidney injury) ([place]) 06/26/2019  . Grade I diastolic dysfunction 48/54/6270  . Mild mitral regurgitation 06/26/2019  . Mild tricuspid regurgitation 06/26/2019  . Lumbar facet arthropathy 11/06/2018  . Prediabetes 02/27/2018  . Medication monitoring encounter 11/13/2017  . Abnormal laboratory test 10/16/2017  . Palpitations 08/23/2017  . Breast pain in female 08/10/2017  . Obesity (BMI 30.0-34.9) 08/10/2017  . Protein in urine 08/10/2017  . Vaginal atrophy 08/10/2017  . Benign essential HTN 11/16/2016  . Hyperlipidemia, mixed 11/16/2016  .  SOBOE (shortness of breath on exertion) 11/16/2016  . Stable angina pectoris ([place]) 11/16/2016  . Dysrhythmia 07/07/2016  . Right low back pain 07/07/2016  . Fall on or from stairs or steps 07/07/2016  . Family history of malignant neoplasm of gastrointestinal tract   . Benign neoplasm of ascending colon   . Benign neoplasm of transverse colon   . Benign neoplasm of descending colon   . Goiter 03/03/2015  . Asthma, mild persistent 03/03/2015  . Gastroesophageal reflux disease without esophagitis 03/03/2015      Current Outpatient Medications:  .  amLODipine (NORVASC) 5 MG tablet, Take by mouth., Disp: , Rfl:  .  apixaban (ELIQUIS) 5 MG TABS tablet, Take by mouth., Disp: , Rfl:  .  atorvastatin (LIPITOR) 10 MG tablet, Take 1 tablet (10 mg total) by mouth every other  day., Disp: 45 tablet, Rfl: 1 .  BREO ELLIPTA 100-25 MCG/INH AEPB, INHALE ONE DOSE BY MOUTH DAILY, Disp: 3 each, Rfl: 1 .  Cholecalciferol (VITAMIN D-1000 MAX ST) 25 MCG (1000 UT) tablet, Take by mouth., Disp: , Rfl:  .  fluticasone (FLONASE) 50 MCG/ACT nasal spray, Place 2 sprays into both nostrils daily., Disp: 16 g, Rfl: 6 .  pantoprazole (PROTONIX) 40 MG tablet, Take 40 mg by mouth daily., Disp: , Rfl:  .  sotalol (BETAPACE) 120 MG tablet, Take by mouth., Disp: , Rfl:  .  SYNTHROID 50 MCG tablet, TAKE 1 BY MOUTH DAILY. DO NOT SUBSTITUTE, Disp: , Rfl: 0 .  VENTOLIN HFA 108 (90 Base) MCG/ACT inhaler, INHALE 2 PUFFS EVERY 6 HOURS AS NEEDED FOR SHORTNESS OF BREATH, Disp: 18 each, Rfl: 2 .  EPINEPHrine (EPIPEN 2-PAK) 0.3 mg/0.3 mL IJ SOAJ injection, Use per package directions; one injection subcutaneously for LIFE-THREATENING emergency, deadly reaction (Patient not taking: Reported on 10/18/2019), Disp: 2 Device, Rfl: 1 .  nitroGLYCERIN (NITROSTAT) 0.4 MG SL tablet, Place 1 tablet (0.4 mg total) under the tongue every 5 (five) minutes as needed for chest pain. Max 3 pills per episode (Patient not taking: Reported on 10/18/2019), Disp: 50 tablet, Rfl: 3   Allergies  Allergen Reactions  . Molds & Smuts Anaphylaxis  . Penicillins Nausea And Vomiting and Rash    Has patient had a PCN reaction causing immediate rash, facial/tongue/throat swelling, SOB or lightheadedness with hypotension: Yes Has patient had a PCN reaction causing severe rash involving mucus membranes or skin necrosis: No Has patient had a PCN reaction that required hospitalization: No Has patient had a PCN reaction occurring within the last 10 years: No If all of the above answers are "NO", then may proceed with Cephalosporin use.      Past Surgical History:  Procedure Laterality Date  . ABDOMINAL HYSTERECTOMY    . COLONOSCOPY WITH PROPOFOL N/A 05/13/2016   Procedure: COLONOSCOPY WITH PROPOFOL;  Surgeon: Lucilla Lame, MD;  Location:  [place];  Service: Endoscopy;  Laterality: N/A;  . COLONOSCOPY WITH PROPOFOL N/A 09/20/2019   Procedure: COLONOSCOPY WITH PROPOFOL;  Surgeon: Lucilla Lame, MD;  Location: [facility] END[facility]COPY;  Service: Endoscopy;  Laterality: N/A;  . ESOPHAGOGASTRODUODENOSCOPY (EGD) WITH PROPOFOL N/A 09/20/2019   Procedure: ESOPHAGOGASTRODUODENOSCOPY (EGD) WITH PROPOFOL;  Surgeon: Lucilla Lame, MD;  Location: [facility] ENDOSCOPY;  Service: Endoscopy;  Laterality: N/A;  . POLYPECTOMY N/A 05/13/2016   Procedure: POLYPECTOMY;  Surgeon: Lucilla Lame, MD;  Location: [place];  Service: Endoscopy;  Laterality: N/A;     Family History  Problem Relation Age of Onset  . Stroke Mother   .  Breast cancer Sister 39       breast ca x3  . Colon cancer Paternal Aunt   . Cancer Sister      Social History   Tobacco Use  . Smoking status: Never Smoker  . Smokeless tobacco: Never Used  Substance Use Topics  . Alcohol use: No    Alcohol/week: 0.0 standard drinks    With staff assistance, above reviewed with the patient today.  ROS: As per HPI, otherwise no specific complaints on a limited and focused system review   No results found for this or any previous visit (from the past 72 hour(s)).   PHQ2/9: Depression screen [facility] 2/9 04/24/2020 12/20/2019 10/18/2019 07/03/2019 05/10/2019  Decreased Interest 0 0 0 0 0  Down, Depressed, Hopeless 0 0 0 0 0  PHQ - 2 Score 0 0 0 0 0  Altered sleeping 0 0 0 0 0  Tired, decreased energy 0 2 1 0 0  Change in appetite 0 0 0 0 0  Feeling bad or failure about yourself  0 0 0 0 0  Trouble concentrating 0 0 0 0 0  Moving slowly or fidgety/restless 0 0 0 0 0  Suicidal thoughts 0 0 0 0 0  PHQ-9 Score 0 2 1 0 0  Difficult doing work/chores Not difficult at all Not difficult at all Not difficult at all Not difficult at all Not difficult at all  Some recent data might be hidden   PHQ-2/9 Result is neg  Fall Risk: Fall Risk  04/24/2020 12/20/2019 10/18/2019 07/03/2019  05/10/2019  Falls in the past year? 0 0 0 1 0  Number falls in past yr: 0 0 0 1 0  Injury with Fall? 0 0 0 0 0  Risk Factor Category  - - - - -  Risk for fall due to : - - - - -  Follow up - - - - -      Objective:   Vitals:   04/24/20 0948  BP: 136/84  Pulse: 90  Resp: 16  Temp: 98.2 F (36.8 C)  TempSrc: Temporal  SpO2: 96%  Weight: 178 lb 12.8 oz (81.1 kg)  Height: $Remove'5\' 4"'[id number]$  (1.626 m)    Body mass index is 30.69 kg/m.  Physical Exam   NAD, masked, pleasant HEENT - [place]/AT, sclera anicteric, positive glasses, PERRL, EOMI, conj - non-inj'ed, no focal sinus tenderness, nares patent without marked erythema, TM's and canals clear, pharynx clear Neck - supple, no adenopathy, no JVD, carotids 2+ and = without bruits bilat Car -regular rhythm with occasional ectopic beats noted, not irregularly irregular, without m/g/r Pulm- CTA without wheeze or rales Abd - soft, NT diffusely, ND,  Back - no CVA tenderness Ext - no LE edema, no active joints Neuro/psychiatric - affect was not flat, appropriate with conversation  Alert   Grossly non-focal   Speech normal   Results for orders placed or performed in visit on 12/20/19  Hemoglobin A1c  Result Value Ref Range   Hgb A1c MFr Bld 5.8 (H) <5.7 % of total Hgb   Mean Plasma Glucose 120 (calc)   eAG (mmol/L) 6.6 (calc)  BASIC METABOLIC PANEL WITH GFR  Result Value Ref Range   Glucose, Bld 99 65 - 99 mg/dL   BUN 15 7 - 25 mg/dL   Creat 0.86 0.60 - 0.93 mg/dL   GFR, Est Non African American 67 > OR = 60 mL/min/1.28m2   GFR, Est African American 78 > OR = 60 mL/min/1.54m2  BUN/Creatinine Ratio NOT APPLICABLE 6 - 22 (calc)   Sodium 140 135 - 146 mmol/L   Potassium 4.2 3.5 - 5.3 mmol/L   Chloride 106 98 - 110 mmol/L   CO2 27 20 - 32 mmol/L   Calcium 9.3 8.6 - 10.4 mg/dL  Lipid panel  Result Value Ref Range   Cholesterol 149 <200 mg/dL   HDL 51 > OR = 50 mg/dL   Triglycerides 63 <150 mg/dL   LDL Cholesterol (Calc) 84 mg/dL (calc)     Total CHOL/HDL Ratio 2.9 <5.0 (calc)   Non-HDL Cholesterol (Calc) 98 <130 mg/dL (calc)   Last labs reviewed    Assessment & Plan:    1. Cough/Bronchitis Noted the increased cough in the recent past, intermittently problematic, keeping her up at night, and minimally productive and if it is,  is a clearish phlegm.  No marked fevers.  Lungs clear on exam.  May have a bronchitis component noted, with the postnasal drip likely a major contributor to the cough. She noted a history of asthma, and had not been taking her inhaler previously, and requested a refill of that today.  It was a [employer] product.  Felt reasonable to refill that [facility] product, as it does have a steroid component which I think will be helpful. Given the cough is keeping her up at night, not opposed to a cough suppressant with a codeine component in the short-term, as she states that has been very helpful for her in the past, and will only short-term. Also felt adding the Flonase product would be helpful to help lessen the postnasal drip which is the source of her cough.  She noted she will get that over-the-counter.  (Did offer to add via a prescription)  - guaiFENesin-codeine 100-10 MG/5ML syrup; Take 5 mLs by mouth 3 (three) times daily as needed for cough. May make drowsy  Dispense: 120 mL; Refill: 0  2. Persistent asthma with acute exacerbation, unspecified asthma severity As above, did refill the Breo product today.  She noted that is the inhaler she needs to have refilled as she can get it at very low cost based on the company that makes that.  - fluticasone furoate-vilanterol (BREO ELLIPTA) 100-25 MCG/INH AEPB; INHALE ONE DOSE BY MOUTH DAILY  Dispense: 60 each; Refill: 1  3. Acute rhinitis As above, did feel adding a Flonase type product would be helpful and recommended.  4. Paroxysmal atrial fibrillation [facility]) She has plans to have the ablation procedure done in September. She noted some hesitancy with this, and I was  supportive with her proceeding in hopes of lessening some of the anxiety with this.  5. Benign essential HTN Blood pressure remains controlled. Continue the present medication regimen.  6. Gastroesophageal reflux disease without esophagitis She notes the reflux has been well controlled here recently, with no increased symptoms that may be contributing to her cough. She remains on a PPI presently.  7. Prediabetes Last A1c in April was reviewed, stable. We will check again on a follow-up visit.  8. Hyperlipidemia, mixed Last lipid panel from April reviewed. Taking the statin sporadically, and encouraged to try to take it a little more frequently as do think will be helpful.  9. Obesity (BMI 30.0-34.9) Weight has been stable in the recent past.  10. Chronic anticoagulation As per cardiology due to her paroxysmal A. Fib The ablation procedure is scheduled in September, and pending success, may be able to this over time  11. Goiter Tinges to follow with  endocrinology, with another follow-up planned at the end of this month.    Tentatively, schedule a follow-up again in 4 months time, follow-up sooner as needed.    Towanda Malkin, MD 04/24/20 10:12 AM

## 2020-04-24 ENCOUNTER — Other Ambulatory Visit: Payer: Self-pay

## 2020-04-24 ENCOUNTER — Encounter: Payer: Self-pay | Admitting: Internal Medicine

## 2020-04-24 ENCOUNTER — Ambulatory Visit (INDEPENDENT_AMBULATORY_CARE_PROVIDER_SITE_OTHER): Payer: Medicare Other | Admitting: Internal Medicine

## 2020-04-24 VITALS — BP 136/84 | HR 90 | Temp 98.2°F | Resp 16 | Ht 64.0 in | Wt 178.8 lb

## 2020-04-24 DIAGNOSIS — E049 Nontoxic goiter, unspecified: Secondary | ICD-10-CM

## 2020-04-24 DIAGNOSIS — K219 Gastro-esophageal reflux disease without esophagitis: Secondary | ICD-10-CM

## 2020-04-24 DIAGNOSIS — E66811 Obesity, class 1: Secondary | ICD-10-CM

## 2020-04-24 DIAGNOSIS — I480 Paroxysmal atrial fibrillation: Secondary | ICD-10-CM | POA: Diagnosis not present

## 2020-04-24 DIAGNOSIS — J45901 Unspecified asthma with (acute) exacerbation: Secondary | ICD-10-CM | POA: Diagnosis not present

## 2020-04-24 DIAGNOSIS — J00 Acute nasopharyngitis [common cold]: Secondary | ICD-10-CM

## 2020-04-24 DIAGNOSIS — R7303 Prediabetes: Secondary | ICD-10-CM

## 2020-04-24 DIAGNOSIS — R05 Cough: Secondary | ICD-10-CM | POA: Diagnosis not present

## 2020-04-24 DIAGNOSIS — Z7901 Long term (current) use of anticoagulants: Secondary | ICD-10-CM

## 2020-04-24 DIAGNOSIS — I10 Essential (primary) hypertension: Secondary | ICD-10-CM

## 2020-04-24 DIAGNOSIS — E782 Mixed hyperlipidemia: Secondary | ICD-10-CM

## 2020-04-24 DIAGNOSIS — R059 Cough, unspecified: Secondary | ICD-10-CM

## 2020-04-24 DIAGNOSIS — E669 Obesity, unspecified: Secondary | ICD-10-CM

## 2020-04-24 DIAGNOSIS — J40 Bronchitis, not specified as acute or chronic: Secondary | ICD-10-CM

## 2020-04-24 MED ORDER — BREO ELLIPTA 100-25 MCG/INH IN AEPB: INHALATION_SPRAY | RESPIRATORY_TRACT | 1 refills | Status: DC

## 2020-04-24 MED ORDER — GUAIFENESIN-CODEINE 100-10 MG/5ML PO SOLN: 5.0000 mL | Freq: Three times a day (TID) | ORAL | 0 refills | Status: DC | PRN

## 2020-04-24 NOTE — Patient Instructions (Signed)
The Breo product was refilled, a cough medicine was prescribed to your pharmacy to take as needed, and also recommended that you pick up a Flonase product to take once daily to help as well.

## 2020-05-21 DIAGNOSIS — E039 Hypothyroidism, unspecified: Secondary | ICD-10-CM | POA: Insufficient documentation

## 2020-06-29 ENCOUNTER — Other Ambulatory Visit: Payer: Self-pay

## 2020-06-29 ENCOUNTER — Emergency Department
Admission: EM | Admit: 2020-06-29 | Discharge: 2020-06-29 | Disposition: A | Discharge status: Home / Self Care | Payer: Medicare Other | Attending: Emergency Medicine | Admitting: Emergency Medicine

## 2020-06-29 ENCOUNTER — Emergency Department: Payer: Medicare Other

## 2020-06-29 ENCOUNTER — Encounter: Payer: Self-pay | Admitting: Emergency Medicine

## 2020-06-29 DIAGNOSIS — I10 Essential (primary) hypertension: Secondary | ICD-10-CM | POA: Diagnosis not present

## 2020-06-29 DIAGNOSIS — Z79899 Other long term (current) drug therapy: Secondary | ICD-10-CM | POA: Diagnosis not present

## 2020-06-29 DIAGNOSIS — E039 Hypothyroidism, unspecified: Secondary | ICD-10-CM | POA: Diagnosis not present

## 2020-06-29 DIAGNOSIS — Z853 Personal history of malignant neoplasm of breast: Secondary | ICD-10-CM | POA: Insufficient documentation

## 2020-06-29 DIAGNOSIS — H66002 Acute suppurative otitis media without spontaneous rupture of ear drum, left ear: Secondary | ICD-10-CM | POA: Diagnosis not present

## 2020-06-29 DIAGNOSIS — E860 Dehydration: Secondary | ICD-10-CM | POA: Diagnosis not present

## 2020-06-29 DIAGNOSIS — J45909 Unspecified asthma, uncomplicated: Secondary | ICD-10-CM | POA: Diagnosis not present

## 2020-06-29 DIAGNOSIS — Z7901 Long term (current) use of anticoagulants: Secondary | ICD-10-CM | POA: Diagnosis not present

## 2020-06-29 DIAGNOSIS — R531 Weakness: Secondary | ICD-10-CM | POA: Diagnosis present

## 2020-06-29 LAB — BASIC METABOLIC PANEL
Anion gap: 7 (ref 5–15)
BUN: 19 mg/dL (ref 8–23)
CO2: 28 mmol/L (ref 22–32)
Calcium: 9.3 mg/dL (ref 8.9–10.3)
Chloride: 103 mmol/L (ref 98–111)
Creatinine, Ser: 1.39 mg/dL — ABNORMAL HIGH (ref 0.44–1.00)
GFR, Estimated: 40 mL/min — ABNORMAL LOW (ref >60)
Glucose, Bld: 117 mg/dL — ABNORMAL HIGH (ref 70–99)
Potassium: 4 mmol/L (ref 3.5–5.1)
Sodium: 138 mmol/L (ref 135–145)

## 2020-06-29 LAB — CBC WITH DIFFERENTIAL/PLATELET
Abs Immature Granulocytes: 0.02 10*3/uL (ref 0.00–0.07)
Basophils Absolute: 0.1 10*3/uL (ref 0.0–0.1)
Basophils Relative: 1 %
Eosinophils Absolute: 0.1 10*3/uL (ref 0.0–0.5)
Eosinophils Relative: 2 %
HCT: 44.2 % (ref 36.0–46.0)
Hemoglobin: 14.3 g/dL (ref 12.0–15.0)
Immature Granulocytes: 0 %
Lymphocytes Relative: 39 %
Lymphs Abs: 2.8 10*3/uL (ref 0.7–4.0)
MCH: 29 pg (ref 26.0–34.0)
MCHC: 32.4 g/dL (ref 30.0–36.0)
MCV: 89.7 fL (ref 80.0–100.0)
Monocytes Absolute: 0.6 10*3/uL (ref 0.1–1.0)
Monocytes Relative: 8 %
Neutro Abs: 3.8 10*3/uL (ref 1.7–7.7)
Neutrophils Relative %: 50 %
Platelets: 237 10*3/uL (ref 150–400)
RBC: 4.93 MIL/uL (ref 3.87–5.11)
RDW: 13.8 % (ref 11.5–15.5)
WBC: 7.4 10*3/uL (ref 4.0–10.5)
nRBC: 0 % (ref 0.0–0.2)

## 2020-06-29 LAB — TROPONIN I (HIGH SENSITIVITY)
Troponin I (High Sensitivity): 7 ng/L (ref <18)
Troponin I (High Sensitivity): 7 ng/L (ref <18)

## 2020-06-29 MED ORDER — ONDANSETRON 4 MG PO TBDP: 4.0000 mg | ORAL_TABLET | Freq: Three times a day (TID) | ORAL | 0 refills | Status: DC | PRN

## 2020-06-29 MED ORDER — CEFDINIR 300 MG PO CAPS: 300.0000 mg | ORAL_CAPSULE | Freq: Two times a day (BID) | ORAL | 0 refills | Status: AC

## 2020-06-29 MED ORDER — ACETAMINOPHEN 500 MG PO TABS
1000.0000 mg | ORAL_TABLET | Freq: Once | ORAL | Status: AC
  Administered 2020-06-29: 1000 mg via ORAL
  Filled 2020-06-29: qty 2

## 2020-06-29 MED ORDER — SODIUM CHLORIDE 0.9 % IV BOLUS
1000.0000 mL | Freq: Once | INTRAVENOUS | Status: AC
  Administered 2020-06-29: 1000 mL via INTRAVENOUS

## 2020-06-29 NOTE — ED Provider Notes (Signed)
[facility] Emergency Department Provider Note  ____________________________________________   First MD Initiated Contact with Patient 06/29/20 1804     (approximate)  I have reviewed the triage vital signs and the nursing notes.   HISTORY  Chief Complaint Shortness of Breath and Dizziness    HPI Sonya Harper is a 74 y.o. female  With h/o HTN, HLD, FM, AFib, here with generalized weakness. Pt states that over the past few days, she's noticed more fatigue, lightheadedness w/ standing that she attributes to her AFib. She has also noticed some aching, throbbing left ear pain for the past day along with swollen LN in her left neck. She states that today, she stood up to use the restroom and felt more lightheaded so she comes in for evaluation. Denies recent med changes. No CP. No known fever, chills, medication changes. No alleviating factors other than lying down/sitting down at rest.       Past Medical History:  Diagnosis Date  . A-fib ([place])   . Allergy   . Arthritis    feet  . Asthma   . Breast neoplasm   . Chronic lower back pain    s/p fall injury  . Depression   . Dysrhythmia   . Fibromyalgia   . Hypertension   . Hypothyroidism   . Lumbar facet arthropathy 11/06/2018  . Prediabetes 02/27/2018  . Sciatica of right side   . Wears dentures    partial upper and lower    Patient Active Problem List   Diagnosis Date Noted  . Persistent asthma with acute exacerbation 04/24/2020  . Polyp of transverse colon   . Polyp of ascending colon   . Chronic anticoagulation 09/12/2019  . Paroxysmal atrial fibrillation ([place]) 09/12/2019  . AKI (acute kidney injury) ([place]) 06/26/2019  . Grade I diastolic dysfunction 53/61/4431  . Mild mitral regurgitation 06/26/2019  . Mild tricuspid regurgitation 06/26/2019  . Lumbar facet arthropathy 11/06/2018  . Prediabetes 02/27/2018  . Medication monitoring encounter 11/13/2017  . Abnormal laboratory test  10/16/2017  . Palpitations 08/23/2017  . Breast pain in female 08/10/2017  . Obesity (BMI 30.0-34.9) 08/10/2017  . Protein in urine 08/10/2017  . Vaginal atrophy 08/10/2017  . Benign essential HTN 11/16/2016  . Hyperlipidemia, mixed 11/16/2016  . SOBOE (shortness of breath on exertion) 11/16/2016  . Stable angina pectoris ([place]) 11/16/2016  . Dysrhythmia 07/07/2016  . Right low back pain 07/07/2016  . Fall on or from stairs or steps 07/07/2016  . Family history of malignant neoplasm of gastrointestinal tract   . Benign neoplasm of ascending colon   . Benign neoplasm of transverse colon   . Benign neoplasm of descending colon   . Goiter 03/03/2015  . Asthma, mild persistent 03/03/2015  . Gastroesophageal reflux disease without esophagitis 03/03/2015    Past Surgical History:  Procedure Laterality Date  . ABDOMINAL HYSTERECTOMY    . COLONOSCOPY WITH PROPOFOL N/A 05/13/2016   Procedure: COLONOSCOPY WITH PROPOFOL;  Surgeon: Lucilla Lame, MD;  Location: [place];  Service: Endoscopy;  Laterality: N/A;  . COLONOSCOPY WITH PROPOFOL N/A 09/20/2019   Procedure: COLONOSCOPY WITH PROPOFOL;  Surgeon: Lucilla Lame, MD;  Location[facility] ENDOSCOPY;  Service: Endoscopy;  Laterality: N/A;  . ESOPHAGOGASTRODUODENOSCOPY (EGD) WITH PROPOFOL N/A 09/20/2019   Procedure: ESOPHAGOGASTRODUODENOSCOPY (EGD) WITH PROPOFOL;  Surgeon: Lucilla Lame, MD;  Location: [facility] ENDOSCOPY;  Service: Endoscopy;  Laterality: N/A;  . POLYPECTOMY N/A 05/13/2016   Procedure: POLYPECTOMY;  Surgeon: Lucilla Lame, MD;  Location: [facility];  Service: Endoscopy;  Laterality: N/A;    Prior to Admission medications   Medication Sig Start Date End Date Taking? Authorizing Provider  amLODipine (NORVASC) 5 MG tablet Take by mouth. 06/30/19 06/29/20  [provider]  apixaban (ELIQUIS) 5 MG TABS tablet Take by mouth. 07/18/19   [provider]  atorvastatin (LIPITOR) 10 MG tablet Take 1 tablet (10 mg  total) by mouth every other day. 12/29/18   Lada, Satira Anis, MD  cefdinir (OMNICEF) 300 MG capsule Take 1 capsule (300 mg total) by mouth 2 (two) times daily for 7 days. 06/29/20 07/06/20  Duffy Bruce, MD  Cholecalciferol (VITAMIN D-1000 MAX ST) 25 MCG (1000 UT) tablet Take by mouth.    [provider]  EPINEPHrine (EPIPEN 2-PAK) 0.3 mg/0.3 mL IJ SOAJ injection Use per package directions; one injection subcutaneously for LIFE-THREATENING emergency, deadly reaction Patient not taking: Reported on 10/18/2019 05/16/18   Arnetha Courser, MD  fluticasone (FLONASE) 50 MCG/ACT nasal spray Place 2 sprays into both nostrils daily. 12/11/18   Poulose, Bethel Born, NP  fluticasone furoate-vilanterol (BREO ELLIPTA) 100-25 MCG/INH AEPB INHALE ONE DOSE BY MOUTH DAILY 04/24/20   Towanda Malkin, MD  guaiFENesin-codeine 100-10 MG/5ML syrup Take 5 mLs by mouth 3 (three) times daily as needed for cough. May make drowsy 04/24/20   Towanda Malkin, MD  nitroGLYCERIN (NITROSTAT) 0.4 MG SL tablet Place 1 tablet (0.4 mg total) under the tongue every 5 (five) minutes as needed for chest pain. Max 3 pills per episode Patient not taking: Reported on 10/18/2019 08/30/18   Arnetha Courser, MD  ondansetron (ZOFRAN ODT) 4 MG disintegrating tablet Take 1 tablet (4 mg total) by mouth every 8 (eight) hours as needed for nausea or vomiting. 06/29/20   Duffy Bruce, MD  pantoprazole (PROTONIX) 40 MG tablet Take 40 mg by mouth daily.    [provider]  SYNTHROID 50 MCG tablet TAKE 1 BY MOUTH DAILY. DO NOT SUBSTITUTE 12/04/14   [provider]  VENTOLIN HFA 108 (90 Base) MCG/ACT inhaler INHALE 2 PUFFS EVERY 6 HOURS AS NEEDED FOR SHORTNESS OF BREATH 01/30/18   Wilhelmina Mcardle, MD    Allergies Molds & smuts and Penicillins  Family History  Problem Relation Age of Onset  . Stroke Mother   . Breast cancer Sister 58       breast ca x3  . Colon cancer Paternal Aunt   . Cancer Sister      Social History Social History   Tobacco Use  . Smoking status: Never Smoker  . Smokeless tobacco: Never Used  Vaping Use  . Vaping Use: Never used  Substance Use Topics  . Alcohol use: No    Alcohol/week: 0.0 standard drinks  . Drug use: Never    Review of Systems  Review of Systems  Constitutional: Positive for fatigue. Negative for fever.  HENT: Positive for ear pain. Negative for congestion and sore throat.   Eyes: Negative for visual disturbance.  Respiratory: Negative for cough and shortness of breath.   Cardiovascular: Negative for chest pain.  Gastrointestinal: Negative for abdominal pain, diarrhea, nausea and vomiting.  Genitourinary: Negative for flank pain.  Musculoskeletal: Negative for back pain and neck pain.  Skin: Negative for rash and wound.  Neurological: Positive for light-headedness. Negative for weakness.  All other systems reviewed and are negative.    ____________________________________________  PHYSICAL EXAM:      VITAL SIGNS: ED Triage Vitals  Enc Vitals Group  BP 06/29/20 1502 (!) 155/73     Pulse Rate 06/29/20 1502 (!) 58     Resp 06/29/20 1502 (!) 22     Temp 06/29/20 1502 98.9 F (37.2 C)     Temp Source 06/29/20 1502 Oral     SpO2 06/29/20 1502 97 %     Weight 06/29/20 1503 178 lb 12.7 oz (81.1 kg)     Height 06/29/20 1503 5\' 4"  (1.626 m)     Head Circumference --      Peak Flow --      Pain Score 06/29/20 1503 5     Pain Loc --      Pain Edu? --      Excl. in [place]? --      Physical Exam Vitals and nursing note reviewed.  Constitutional:      General: She is not in acute distress.    Appearance: She is well-developed.  HENT:     Head: Normocephalic and atraumatic.     Comments: Mild posterior pharyngeal erythema. Bilateral ME effusions, with erythema and opacification of left TM. No postauricular/mastoid tenderness or erythema. Eyes:     Conjunctiva/sclera: Conjunctivae normal.  Neck:     Comments: Diffusely  enlarged, mobile, minimally tender left anterior cervical lymphadenopathy. Cardiovascular:     Rate and Rhythm: Normal rate and regular rhythm.     Heart sounds: Normal heart sounds. No murmur heard.  No friction rub.  Pulmonary:     Effort: Pulmonary effort is normal. No respiratory distress.     Breath sounds: Normal breath sounds. No wheezing or rales.  Abdominal:     General: There is no distension.     Palpations: Abdomen is soft.     Tenderness: There is no abdominal tenderness.  Musculoskeletal:     Cervical back: Neck supple.  Skin:    General: Skin is warm.     Capillary Refill: Capillary refill takes less than 2 seconds.  Neurological:     Mental Status: She is alert and oriented to person, place, and time.     Motor: No abnormal muscle tone.       ____________________________________________   LABS (all labs ordered are listed, but only abnormal results are displayed)  Labs Reviewed  BASIC METABOLIC PANEL - Abnormal; Notable for the following components:      Result Value   Glucose, Bld 117 (*)    Creatinine, Ser 1.39 (*)    GFR, Estimated 40 (*)    All other components within normal limits  CBC WITH DIFFERENTIAL/PLATELET  TROPONIN I (HIGH SENSITIVITY)  TROPONIN I (HIGH SENSITIVITY)    ____________________________________________  EKG:  Sinus bradycardia, VR 57. PR 156, QRS 74, QTc 473. Non-specific TWI in inferio lateral leads, LVH. Likely changes of LVH when compared to prior. ________________________________________  RADIOLOGY All imaging, including plain films, CT scans, and ultrasounds, independently reviewed by me, and interpretations confirmed via formal radiology reads.  ED MD interpretation:   CXR: Clear, on focal abnormality  Official radiology report(s): DG Chest 2 View  Result Date: 06/29/2020 CLINICAL DATA:  Shortness of breath and dizziness. EXAM: CHEST - 2 VIEW COMPARISON:  December 03, 2017 FINDINGS: The heart size and mediastinal  contours are within normal limits. Tortuosity of the descending thoracic aorta is noted. Both lungs are clear. The visualized skeletal structures are unremarkable. IMPRESSION: No active cardiopulmonary disease. Electronically Signed   By: Virgina Norfolk M.D.   On: 06/29/2020 15:34    ____________________________________________  PROCEDURES  Procedure(s) performed (including Critical Care):  Procedures  ____________________________________________  INITIAL IMPRESSION / MDM / ASSESSMENT AND PLAN / ED COURSE  As part of my medical decision making, I reviewed the following data within the [place] notes reviewed and incorporated, Old chart reviewed, Notes from prior ED visits, and [place] Controlled Substance Database       *ANASTASIA TOMPSON was evaluated in Emergency Department on 06/29/2020 for the symptoms described in the history of present illness. She was evaluated in the context of the global COVID-19 pandemic, which necessitated consideration that the patient might be at risk for infection with the SARS-CoV-2 virus that causes COVID-19. Institutional protocols and algorithms that pertain to the evaluation of patients at risk for COVID-19 are in a state of rapid change based on information released by regulatory bodies including the CDC and federal and state organizations. These policies and algorithms were followed during the patient's care in the ED.  Some ED evaluations and interventions may be delayed as a result of limited staffing during the pandemic.*     Medical Decision Making:  74 yo F here with mild lightheadedness upon standing, mild ear pain, cough. Suspect mild dehydration with orthostasis, likely in setting of URI with possible early AOM.  Re: her weakness - EKG shows nonspecific TWC which I suspect are related to LVH. She has no ongoing CP and neg trop x 2 - doubt ACS acutely. Cr slightly elevated which I suspect is 2/2 her dehydration. She's  had slight poor appetite and all her sx correlate with standing. CBC without leukocytosis, anemia, or significant abnormality. CXR is clear w/o PNA. Feels much better after fluids, will encourage hydration.  Re: neck pain/ear pain - suspect AOM/sinusitis with reactive LAD. No mastoid tenderness, erythema or sigsn fo mastoiditis. CBC w/o leukocytosis or signs to suggest leukemia, lymphoma. Will tx with ABX .  ____________________________________________  FINAL CLINICAL IMPRESSION(S) / ED DIAGNOSES  Final diagnoses:  Dehydration  Non-recurrent acute suppurative otitis media of left ear without spontaneous rupture of tympanic membrane     MEDICATIONS GIVEN DURING THIS VISIT:  Medications  sodium chloride 0.9 % bolus 1,000 mL (0 mLs Intravenous Stopped 06/29/20 2011)  acetaminophen (TYLENOL) tablet 1,000 mg (1,000 mg Oral Given 06/29/20 1842)     ED Discharge Orders         Ordered    ondansetron (ZOFRAN ODT) 4 MG disintegrating tablet  Every 8 hours PRN        06/29/20 2015    cefdinir (OMNICEF) 300 MG capsule  2 times daily        06/29/20 2015           Note:  This document was prepared using Dragon voice recognition software and may include unintentional dictation errors.   Duffy Bruce, MD 06/29/20 2206

## 2020-06-29 NOTE — ED Triage Notes (Signed)
Pt presents to ED via POV with c/o SOB and dizziness, pt states SOb started yesterday, dizziness upon awakening, pt states awoke this morning with swelling to L side of her neck. Pt states hx of A-fib at this time.

## 2020-07-01 ENCOUNTER — Other Ambulatory Visit: Payer: Self-pay | Admitting: Internal Medicine

## 2020-07-01 ENCOUNTER — Telehealth: Payer: Self-pay

## 2020-07-01 NOTE — Telephone Encounter (Signed)
Copied from [place] [id number]. Topic: General - Inquiry >> Jul 01, 2020  3:52 PM Alease Frame wrote: Reason for CRM: Pt wanted Dr Roxan Hockey to know she was seen at the ER Monday Oct 25th for neck pain and cough as well . Please advise

## 2020-07-01 NOTE — Telephone Encounter (Signed)
Medication Refill - Medication: VENTOLIN HFA 108 (90 Base) MCG/ACT inhaler [[id number]]     Preferred Pharmacy (with phone number or street name):  [facility], [address]  [place] [address]  Phone: [phone] Fax: [phone]  Hours: Not open 24 hours     Agent: Please be advised that RX refills may take up to 3 business days. We ask that you follow-up with your pharmacy.

## 2020-07-01 NOTE — Telephone Encounter (Signed)
Copied from [place] [id number]. Topic: General - Inquiry >> Jul 01, 2020  3:49 PM Alease Frame wrote: Reason for CRM: Pt is requesting medication for a recurring cough that she has . Please advise

## 2020-07-02 MED ORDER — ALBUTEROL SULFATE HFA 108 (90 BASE) MCG/ACT IN AERS
INHALATION_SPRAY | RESPIRATORY_TRACT | 1 refills | Status: DC
Start: 2020-07-02 — End: 2022-02-16

## 2020-07-02 NOTE — Telephone Encounter (Signed)
Reviewed the ER visit note. If her cough is continuing, patient will need assessed as to best next steps.  Thanks,

## 2020-07-06 NOTE — Telephone Encounter (Signed)
Patient will schedule appointment for cough.

## 2020-07-07 NOTE — Progress Notes (Signed)
Name: Sonya Harper   MRN: [id number]    DOB: 19-Mar-1946   Date:07/08/2020       Progress Note  Subjective  Chief Complaint  Chief Complaint  Patient presents with  . Cough    I connected with  Sonya Harper on 07/08/20 at  8:00 AM EDT by telephone and verified that I am speaking with the correct person using two identifiers.  I discussed the limitations, risks, security and privacy concerns of performing an evaluation and management service by telephone and the availability of in person appointments. The patient expressed understanding and agreed to proceed. Staff also discussed with the patient that there may be a patient responsible charge related to this service. Patient Location: Home Provider Location: [facility] Additional Individuals present: none  HPI Patient is a 74 year old female Last visit was 04/24/2020 with complaints of persistent cough. Follows up today with a phone visit with the same complaint. Noted the marked limitations of this being a phone visit again today.  SHe was recently seen in the emergency room 06/29/2020 with the following history:  Sonya Harper is a 73 y.o. female  With h/o HTN, HLD, FM, AFib, here with generalized weakness. Pt states that over the past few days, she's noticed more fatigue, lightheadedness w/ standing that she attributes to her AFib. She has also noticed some aching, throbbing left ear pain for the past day along with swollen LN in her left neck. She states that today, she stood up to use the restroom and felt more lightheaded so she comes in for evaluation. Denies recent med changes. No CP. No known fever, chills, medication changes. No alleviating factors other than lying down/sitting down at rest. The impression was as follows:  Medical Decision Making:  73 yo F here with mild lightheadedness upon standing, mild ear pain, cough. Suspect mild dehydration with orthostasis, likely in setting of URI with possible early AOM. Re:  her weakness - EKG shows nonspecific TWC which I suspect are related to LVH. She has no ongoing CP and neg trop x 2 - doubt ACS acutely. Cr slightly elevated which I suspect is 2/2 her dehydration. She's had slight poor appetite and all her sx correlate with standing. CBC without leukocytosis, anemia, or significant abnormality. CXR is clear w/o PNA. Feels much better after fluids, will encourage hydration. Re: neck pain/ear pain - suspect AOM/sinusitis with reactive LAD. No mastoid tenderness, erythema or sigsn fo mastoiditis. CBC w/o leukocytosis or signs to suggest leukemia, lymphoma. Will tx with ABX .  She sent a message on 07/01/2020 as follows:  Pt wanted Sonya Harper to know she was seen at the ER Monday Oct 25th for neck pain and cough as well . Please advise I recommended a follow-up visit to evaluate before further recommendations could be given. Had Covid vaccine, not yet get booster Had flu vaccine as well  She states this time the cough is different, started a week prior to going to the ER and worse at night, does cough during the day too.  Sometimes keeping her up at night. Was given abx cefdinir at ER with a CXR clear and not help as much with the cough. Neck and ear better, but cough has persisted. Cough minimally productive at best, mostly dry, no discolored mucus No loss of taste or smell No Fevers,  No N/V +  Mild postnasal drip during the day - sometimes, no marked sinus tenderness, no sore throat Trying nothing for cough, not  know what to take Not using Flonase in recent past.  Mild persistent asthma without complication She notes this has remained stable, although the cough increased recently has been an issue.   She notes she uses the [place] daily as she can get that now at a low cost from that company that makes it. She uses albuterol a couple times a day and states helps Denies marked wheezing, Occas SOB when tired   Gastroesophageal reflux disease without  esophagitis Medication regimen-on protonix This has remained controlled. Mild increase symptoms noted recently and I noted this can cause cough as well GI has been involved with EGD showingchronic inactive gastritis without any ulcerations or active inflammation.  She also has chronic A. fib, has been followed by cardiology, and has not yet had the procedure done, now thinking will likely wait till after the holidays to have it done.     Patient Active Problem List   Diagnosis Date Noted  . Persistent asthma with acute exacerbation 04/24/2020  . Polyp of transverse colon   . Polyp of ascending colon   . Chronic anticoagulation 09/12/2019  . Paroxysmal atrial fibrillation ([place]) 09/12/2019  . AKI (acute kidney injury) ([place]) 06/26/2019  . Grade I diastolic dysfunction 80/16/5537  . Mild mitral regurgitation 06/26/2019  . Mild tricuspid regurgitation 06/26/2019  . Lumbar facet arthropathy 11/06/2018  . Prediabetes 02/27/2018  . Medication monitoring encounter 11/13/2017  . Abnormal laboratory test 10/16/2017  . Palpitations 08/23/2017  . Breast pain in female 08/10/2017  . Obesity (BMI 30.0-34.9) 08/10/2017  . Protein in urine 08/10/2017  . Vaginal atrophy 08/10/2017  . Benign essential HTN 11/16/2016  . Hyperlipidemia, mixed 11/16/2016  . SOBOE (shortness of breath on exertion) 11/16/2016  . Stable angina pectoris ([place]) 11/16/2016  . Dysrhythmia 07/07/2016  . Right low back pain 07/07/2016  . Fall on or from stairs or steps 07/07/2016  . Family history of malignant neoplasm of gastrointestinal tract   . Benign neoplasm of ascending colon   . Benign neoplasm of transverse colon   . Benign neoplasm of descending colon   . Goiter 03/03/2015  . Asthma, mild persistent 03/03/2015  . Gastroesophageal reflux disease without esophagitis 03/03/2015    Past Surgical History:  Procedure Laterality Date  . ABDOMINAL HYSTERECTOMY    . COLONOSCOPY WITH PROPOFOL N/A 05/13/2016    Procedure: COLONOSCOPY WITH PROPOFOL;  Surgeon: Lucilla Lame, MD;  Location: [place];  Service: Endoscopy;  Laterality: N/A;  . COLONOSCOPY WITH PROPOFOL N/A 09/20/2019   Procedure: COLONOSCOPY WITH PROPOFOL;  Surgeon: Lucilla Lame, MD;  Location[facility] ENDOSCOPY;  Service: Endoscopy;  Laterality: N/A;  . ESOPHAGOGASTRODUODENOSCOPY (EGD) WITH PROPOFOL N/A 09/20/2019   Procedure: ESOPHAGOGASTRODUODENOSCOPY (EGD) WITH PROPOFOL;  Surgeon: Lucilla Lame, MD;  Location: [facility] ENDOSCOPY;  Service: Endoscopy;  Laterality: N/A;  . POLYPECTOMY N/A 05/13/2016   Procedure: POLYPECTOMY;  Surgeon: Lucilla Lame, MD;  Location: [place];  Service: Endoscopy;  Laterality: N/A;    Family History  Problem Relation Age of Onset  . Stroke Mother   . Breast cancer Sister 71       breast ca x3  . Colon cancer Paternal Aunt   . Cancer Sister     Social History   Tobacco Use  . Smoking status: Never Smoker  . Smokeless tobacco: Never Used  Substance Use Topics  . Alcohol use: No    Alcohol/week: 0.0 standard drinks     Current Outpatient Medications:  .  albuterol (VENTOLIN HFA) 108 (90  Base) MCG/ACT inhaler, INHALE 2 PUFFS EVERY 6 HOURS AS NEEDED FOR SHORTNESS OF BREATH, Disp: 6.7 g, Rfl: 1 .  amLODipine (NORVASC) 5 MG tablet, Take by mouth., Disp: , Rfl:  .  apixaban (ELIQUIS) 5 MG TABS tablet, Take by mouth., Disp: , Rfl:  .  atorvastatin (LIPITOR) 10 MG tablet, Take 1 tablet (10 mg total) by mouth every other day., Disp: 45 tablet, Rfl: 1 .  Cholecalciferol (VITAMIN D-1000 MAX ST) 25 MCG (1000 UT) tablet, Take by mouth., Disp: , Rfl:  .  EPINEPHrine (EPIPEN 2-PAK) 0.3 mg/0.3 mL IJ SOAJ injection, Use per package directions; one injection subcutaneously for LIFE-THREATENING emergency, deadly reaction, Disp: 2 Device, Rfl: 1 .  fluticasone (FLONASE) 50 MCG/ACT nasal spray, Place 2 sprays into both nostrils daily., Disp: 16 g, Rfl: 6 .  fluticasone furoate-vilanterol (BREO ELLIPTA) 100-25  MCG/INH AEPB, INHALE ONE DOSE BY MOUTH DAILY, Disp: 60 each, Rfl: 1 .  nitroGLYCERIN (NITROSTAT) 0.4 MG SL tablet, Place 1 tablet (0.4 mg total) under the tongue every 5 (five) minutes as needed for chest pain. Max 3 pills per episode, Disp: 50 tablet, Rfl: 3 .  ondansetron (ZOFRAN ODT) 4 MG disintegrating tablet, Take 1 tablet (4 mg total) by mouth every 8 (eight) hours as needed for nausea or vomiting., Disp: 12 tablet, Rfl: 0 .  pantoprazole (PROTONIX) 40 MG tablet, Take 40 mg by mouth daily., Disp: , Rfl:  .  SYNTHROID 50 MCG tablet, TAKE 1 BY MOUTH DAILY. DO NOT SUBSTITUTE, Disp: , Rfl: 0  Allergies  Allergen Reactions  . Molds & Smuts Anaphylaxis  . Penicillins Nausea And Vomiting and Rash    Has patient had a PCN reaction causing immediate rash, facial/tongue/throat swelling, SOB or lightheadedness with hypotension: Yes Has patient had a PCN reaction causing severe rash involving mucus membranes or skin necrosis: No Has patient had a PCN reaction that required hospitalization: No Has patient had a PCN reaction occurring within the last 10 years: No If all of the above answers are "NO", then may proceed with Cephalosporin use.     With staff assistance, above reviewed with the patient  today.  ROS: As per HPI, otherwise no specific complaints on a limited and focused system review   Objective  Virtual encounter, vitals not obtained.  There is no height or weight on file to calculate BMI.  Physical Exam   Appears in NAD via conversation, very pleasant Breathing: No obvious respiratory distress. Speaking in complete sentences Neurological: Pt is alert, Speech is normal Psychiatric: Patient has a normal mood and affect, behavior is normal. Judgment and thought content normal.   No results found for this or any previous visit (from the past 72 hour(s)).  PHQ2/9: Depression screen [facility] 2/9 07/08/2020 04/24/2020 12/20/2019 10/18/2019 07/03/2019  Decreased Interest 0 0 0 0 0  Down,  Depressed, Hopeless 1 0 0 0 0  PHQ - 2 Score 1 0 0 0 0  Altered sleeping 3 0 0 0 0  Tired, decreased energy 3 0 2 1 0  Change in appetite 0 0 0 0 0  Feeling bad or failure about yourself  0 0 0 0 0  Trouble concentrating 0 0 0 0 0  Moving slowly or fidgety/restless 0 0 0 0 0  Suicidal thoughts 0 0 0 0 0  PHQ-9 Score 7 0 2 1 0  Difficult doing work/chores Not difficult at all Not difficult at all Not difficult at all Not difficult at all Not difficult  at all  Some recent data might be hidden   PHQ-2/9 Result reviewed  Fall Risk: Fall Risk  07/08/2020 04/24/2020 12/20/2019 10/18/2019 07/03/2019  Falls in the past year? 1 0 0 0 1  Number falls in past yr: 0 0 0 0 1  Injury with Fall? 0 0 0 0 0  Risk Factor Category  - - - - -  Risk for fall due to : - - - - -  Follow up - - - - -     Assessment & Plan  1. Cough/bronchitis 2. Persistent asthma with acute exacerbation, unspecified asthma severity Discussed with patient the likelihood of a possible mild asthma exacerbation/bronchitis with her recent infectious symptoms noted which have improved after completing the antibiotic course prescribed by the emergency room.  She has had no fevers, no discolored mucus, no productive cough, and doubt an pneumonia or more infectious concern requiring another course of antibiotics presently.  She was in agreement with this.  Emphasized the importance of closely monitoring, and if she does start to develop fevers, more discolored mucus, more production to her cough, she needs to follow-up. We will add a steroid entity, prednisone, with a 6-day course prescribed and a wean component to this. Also to continue the Breo inhaler daily and the albuterol inhaler as needed, currently using a couple times a day. Also recommended a Mucinex DM or Robitussin-DM product as needed.  Felt best holding off on an entity that is stronger as a cough suppressant, one with codeine presently as we continue to closely  monitor.   3. Gastroesophageal reflux disease without esophagitis Emphasized the importance of continuing the Protonix product, as noted reflux can worsen cough. If the symptoms are more problematic, needs to follow-up as well.  Emphasized the importance of following up if symptoms not improving or more problematic despite the above, and noted may need to get another chest x-ray or add additional medications if more infectious concerns are again arising, and she was understanding of that.  Discussed potentially getting a Covid test, and she was convinced she does not think this is Covid, she has had the vaccine.  If symptoms not improving or increasing, do think we will need to pursue a Covid test as well.  I discussed the assessment and treatment plan with the patient. The patient was provided an opportunity to ask questions and all were answered. The patient agreed with the plan and demonstrated an understanding of the instructions.  Red flags and when to present for emergency care or RTC including fevers, chest pain, shortness of breath, new/worsening/un-resolving symptoms reviewed with patient at time of visit.   The patient was advised to call back or seek an in-person evaluation if the symptoms worsen or if the condition fails to improve as anticipated.  I provided 20 minutes of non-face-to-face time during this encounter that included discussing at length patient's sx/history, pertinent pmhx, medications, treatment and follow up plan. This time also included the necessary documentation, orders, and chart review.  Towanda Malkin, MD

## 2020-07-08 ENCOUNTER — Telehealth: Payer: Medicare Other | Admitting: Internal Medicine

## 2020-07-08 ENCOUNTER — Encounter: Payer: Self-pay | Admitting: Internal Medicine

## 2020-07-08 ENCOUNTER — Ambulatory Visit (INDEPENDENT_AMBULATORY_CARE_PROVIDER_SITE_OTHER): Payer: Medicare Other | Admitting: Internal Medicine

## 2020-07-08 DIAGNOSIS — J00 Acute nasopharyngitis [common cold]: Secondary | ICD-10-CM

## 2020-07-08 DIAGNOSIS — K219 Gastro-esophageal reflux disease without esophagitis: Secondary | ICD-10-CM | POA: Diagnosis not present

## 2020-07-08 DIAGNOSIS — J45901 Unspecified asthma with (acute) exacerbation: Secondary | ICD-10-CM | POA: Diagnosis not present

## 2020-07-08 DIAGNOSIS — R059 Cough, unspecified: Secondary | ICD-10-CM | POA: Diagnosis not present

## 2020-07-08 DIAGNOSIS — R053 Chronic cough: Secondary | ICD-10-CM

## 2020-07-08 DIAGNOSIS — J40 Bronchitis, not specified as acute or chronic: Secondary | ICD-10-CM | POA: Diagnosis not present

## 2020-07-08 MED ORDER — PREDNISONE 10 MG PO TABS: ORAL_TABLET | ORAL | 0 refills | Status: DC

## 2020-08-04 ENCOUNTER — Telehealth: Payer: Self-pay

## 2020-08-04 NOTE — Telephone Encounter (Signed)
Copied from [place] [id number]. Topic: General - Other >> Aug 04, 2020  4:11 PM Keene Breath wrote: Reason for CRM: Patient called to let the doctor know that she is still not feeling well even after taking the prednisone prescribed.  She stated she can still feel sickness in her throat.  Please advise and call patient to let her know if the medicine should be adjusted.  CB# [phone]

## 2020-08-05 NOTE — Telephone Encounter (Signed)
She has a f/u planned for later this month. If she feels like she needs seen sooner, can schedule a f/u sooner. Thanks

## 2020-08-06 NOTE — Telephone Encounter (Signed)
Tried to call pt to have her schedule an earlier appt, her mailbox was full, no message was left

## 2020-08-06 NOTE — Telephone Encounter (Signed)
Pt called back and would like to do a tele visit sooner.  Pt has been scheduled for Monday 12/06 at 7:40 am

## 2020-08-07 NOTE — Progress Notes (Signed)
Name: Sonya Harper   MRN: [id number]    DOB: 09-Dec-1945   Date:08/10/2020       Progress Note  Subjective  Chief Complaint  Chief Complaint  Patient presents with  . Follow-up    I connected with  [name] on 08/10/20 at  7:40 AM EST by telephone and verified that I am speaking with the correct person using two identifiers.  I discussed the limitations, risks, security and privacy concerns of performing an evaluation and management service by telephone and the availability of in person appointments. The patient expressed understanding and agreed to proceed. Staff also discussed with the patient that there may be a patient responsible charge related to this service. Patient Location: Home Provider Location: [facility] Additional Individuals present: none  HPI Patient is a 74 year old female Last visit was 11/3//2021  Follows up today with persistent symptoms since that visit which was a phone visit, and today's visit is also a phone visit, with the significant limitations noted due to this.  SHe was seen in the emergency room 06/29/2020 with the following history:             MAVA SUARES a 74 y.o.femaleWith h/o HTN, HLD, FM, AFib, here with generalized weakness. Pt states that over the past few days, she's noticed more fatigue, lightheadedness w/ standing that she attributes to her AFib. She has also noticed some aching, throbbing left ear pain for the past day along with swollen LN in her left neck. She states that today, she stood up to use the restroom and felt more lightheaded so she comes in for evaluation. Denies recent med changes. No CP. No known fever, chills, medication changes. No alleviating factors other than lying down/sitting down at rest. The impression was as follows:             Medical Decision Making:73 yo F here with mild lightheadedness upon standing, mild ear pain, cough.Suspect mild dehydration with orthostasis, likely in setting of URI  with possible early AOM. Re: her weakness - EKG shows nonspecific TWC which I suspect are related to LVH. She has no ongoing CP and neg trop x 2 - doubt ACS acutely. Cr slightly elevated which I suspect is 2/2 her dehydration. She's had slight poor appetite and all her sx correlate with standing. CBC without leukocytosis, anemia, or significant abnormality. CXR is clear w/o PNA. Feels much better after fluids, will encourage hydration. Re: neck pain/ear pain - suspect AOM/sinusitis with reactive LAD. No mastoid tenderness, erythema or sigsn fo mastoiditis. CBC w/o leukocytosis or signs to suggest leukemia, lymphoma. Will tx with ABX .  She sent a message on 07/01/2020 as follows:             Pt wanted Dr Roxan Hockey to know she was seen at the ER Monday Oct 25th for neck pain and cough as well . Please advise I recommended a follow-up visit to evaluate before further recommendations could be given. Had Covid vaccine, not yet get booster Had flu vaccine as well  On that last visit, she noted the cough was different, started a week prior to going to the ER and worse at night, did cough during the day.  Sometimes the cough was keeping her up at night. Was given abx cefdinir at ER with a CXR clear and did not help much with the cough. Neck and ear sx's were better, but cough persisted. Cough was minimally productive at best, mostly dry, no discolored  mucus. She had no loss of taste or smell, No Fevers, No N/V. My A/P was as follows:  1. Cough/bronchitis  2. Persistent asthma with acute exacerbation, unspecified asthma severity Discussed with patient the likelihood of a possible mild asthma exacerbation/bronchitis with her recent infectious symptoms noted which have improved after completing the antibiotic course prescribed by the emergency room.  She has had no fevers, no discolored mucus, no productive cough, and doubt an pneumonia or more infectious concern requiring another course of antibiotics  presently.  She was in agreement with this.  Emphasized the importance of closely monitoring, and if she does start to develop fevers, more discolored mucus, more production to her cough, she needs to follow-up. We will add a steroid entity, prednisone, with a 6-day course prescribed and a wean component to this. Also to continue the Breo inhaler daily and the albuterol inhaler as needed, currently using a couple times a day. Also recommended a Mucinex DM or Robitussin-DM product as needed.  Felt best holding off on an entity that is stronger as a cough suppressant, one with codeine presently as we continue to closely monitor.   3. Gastroesophageal reflux disease without esophagitis Emphasized the importance of continuing the Protonix product, as noted reflux can worsen cough. If the symptoms are more problematic, needs to follow-up as well.  Discussed potentially getting a Covid test, and she was convinced she does not think this is Covid, she has had the vaccine.  If symptoms not improving or increasing, do think we will need to pursue a Covid test as well.  Of note, she was to have the cardiac ablation in November, but noted concerns being close to the holidays, and has been rescheduled to January.  On November 30, she left this message:  Patient called to let the doctor know that she is still not feeling well even after taking the prednisone prescribed.  She stated she can still feel sickness in her throat.  Please advise and call patient to let her know if the medicine should be adjusted.  CB# [phone].  Recommended scheduling a follow-up sooner than was originally planned. Follows up today   She noted her symptoms did improve after taking the prednisone course. Then sx's came back, mainly the ear. Still cough somes and notes not as problematic, not markedly productive.  No shortness of breath. Notes her left ear still hurts some, and intermittently feels discomfort in the lymph nodes  beneath the ear in the neck. Not hurt to tug on the ear, feel inside the ear, not keeping up at night. Denies any increase in size. No fevers, denies any increase PND, no discolored mucus, no sinus pains, mucus is white if does have.  No loss of taste or smell. No bad muscle aches No N/V  All - PCN (hives/rash), did answer yes to one of the 4 questions asked to entertain cephalosporin use previously Had Covid vaccine, is going to get the booster and rec'ed  She has a history of mild persistent asthma, with is fairly stable in the recent past.  She also has chronic A. fib, has been followed by cardiology, and has not yet had the ablation procedure done, and postponed it now till after the holidays with it scheduled in January.    Chronic anticoagulation Continuingthe apixaban with her paroxysmal A. fib as per cardiology.      Patient Active Problem List   Diagnosis Date Noted  . Persistent asthma with acute exacerbation 04/24/2020  . Polyp  of transverse colon   . Polyp of ascending colon   . Chronic anticoagulation 09/12/2019  . Paroxysmal atrial fibrillation ([place]) 09/12/2019  . AKI (acute kidney injury) ([place]) 06/26/2019  . Grade I diastolic dysfunction 16/06/9603  . Mild mitral regurgitation 06/26/2019  . Mild tricuspid regurgitation 06/26/2019  . Lumbar facet arthropathy 11/06/2018  . Prediabetes 02/27/2018  . Medication monitoring encounter 11/13/2017  . Abnormal laboratory test 10/16/2017  . Palpitations 08/23/2017  . Breast pain in female 08/10/2017  . Obesity (BMI 30.0-34.9) 08/10/2017  . Protein in urine 08/10/2017  . Vaginal atrophy 08/10/2017  . Benign essential HTN 11/16/2016  . Hyperlipidemia, mixed 11/16/2016  . SOBOE (shortness of breath on exertion) 11/16/2016  . Stable angina pectoris ([place]) 11/16/2016  . Dysrhythmia 07/07/2016  . Right low back pain 07/07/2016  . Fall on or from stairs or steps 07/07/2016  . Family history of malignant neoplasm of  gastrointestinal tract   . Benign neoplasm of ascending colon   . Benign neoplasm of transverse colon   . Benign neoplasm of descending colon   . Goiter 03/03/2015  . Asthma, mild persistent 03/03/2015  . Gastroesophageal reflux disease without esophagitis 03/03/2015    Past Surgical History:  Procedure Laterality Date  . ABDOMINAL HYSTERECTOMY    . COLONOSCOPY WITH PROPOFOL N/A 05/13/2016   Procedure: COLONOSCOPY WITH PROPOFOL;  Surgeon: Lucilla Lame, MD;  Location: [facility];  Service: Endoscopy;  Laterality: N/A;  . COLONOSCOPY WITH PROPOFOL N/A 09/20/2019   Procedure: COLONOSCOPY WITH PROPOFOL;  Surgeon: Lucilla Lame, MD;  Location: [facility];  Service: Endoscopy;  Laterality: N/A;  . ESOPHAGOGASTRODUODENOSCOPY (EGD) WITH PROPOFOL N/A 09/20/2019   Procedure: ESOPHAGOGASTRODUODENOSCOPY (EGD) WITH PROPOFOL;  Surgeon: Lucilla Lame, MD;  Location: [facility] ENDOSCOPY;  Service: Endoscopy;  Laterality: N/A;  . POLYPECTOMY N/A 05/13/2016   Procedure: POLYPECTOMY;  Surgeon: Lucilla Lame, MD;  Location: [facility];  Service: Endoscopy;  Laterality: N/A;    Family History  Problem Relation Age of Onset  . Stroke Mother   . Breast cancer Sister 54       breast ca x3  . Colon cancer Paternal Aunt   . Cancer Sister     Social History   Tobacco Use  . Smoking status: Never Smoker  . Smokeless tobacco: Never Used  Substance Use Topics  . Alcohol use: No    Alcohol/week: 0.0 standard drinks     Current Outpatient Medications:  .  albuterol (VENTOLIN HFA) 108 (90 Base) MCG/ACT inhaler, INHALE 2 PUFFS EVERY 6 HOURS AS NEEDED FOR SHORTNESS OF BREATH, Disp: 6.7 g, Rfl: 1 .  amLODipine (NORVASC) 5 MG tablet, Take by mouth., Disp: , Rfl:  .  apixaban (ELIQUIS) 5 MG TABS tablet, Take by mouth., Disp: , Rfl:  .  atorvastatin (LIPITOR) 10 MG tablet, Take 1 tablet (10 mg total) by mouth every other day., Disp: 45 tablet, Rfl: 1 .  Cholecalciferol (VITAMIN D-1000 MAX ST) 25 MCG  (1000 UT) tablet, Take by mouth., Disp: , Rfl:  .  EPINEPHrine (EPIPEN 2-PAK) 0.3 mg/0.3 mL IJ SOAJ injection, Use per package directions; one injection subcutaneously for LIFE-THREATENING emergency, deadly reaction, Disp: 2 Device, Rfl: 1 .  fluticasone (FLONASE) 50 MCG/ACT nasal spray, Place 2 sprays into both nostrils daily., Disp: 16 g, Rfl: 6 .  fluticasone furoate-vilanterol (BREO ELLIPTA) 100-25 MCG/INH AEPB, INHALE ONE DOSE BY MOUTH DAILY, Disp: 60 each, Rfl: 1 .  nitroGLYCERIN (NITROSTAT) 0.4 MG SL tablet, Place 1 tablet (0.4 mg total) under  the tongue every 5 (five) minutes as needed for chest pain. Max 3 pills per episode, Disp: 50 tablet, Rfl: 3 .  ondansetron (ZOFRAN ODT) 4 MG disintegrating tablet, Take 1 tablet (4 mg total) by mouth every 8 (eight) hours as needed for nausea or vomiting., Disp: 12 tablet, Rfl: 0 .  pantoprazole (PROTONIX) 40 MG tablet, Take 40 mg by mouth daily., Disp: , Rfl:  .  predniSONE (DELTASONE) 10 MG tablet, Take four tabs daily X 2 days, then two tabs daily X 2 days, then one tab daily X 2 days, Disp: 14 tablet, Rfl: 0 .  SYNTHROID 50 MCG tablet, TAKE 1 BY MOUTH DAILY. DO NOT SUBSTITUTE, Disp: , Rfl: 0  Allergies  Allergen Reactions  . Molds & Smuts Anaphylaxis  . Penicillins Nausea And Vomiting and Rash    Has patient had a PCN reaction causing immediate rash, facial/tongue/throat swelling, SOB or lightheadedness with hypotension: Yes Has patient had a PCN reaction causing severe rash involving mucus membranes or skin necrosis: No Has patient had a PCN reaction that required hospitalization: No Has patient had a PCN reaction occurring within the last 10 years: No If all of the above answers are "NO", then may proceed with Cephalosporin use.     With staff assistance, above reviewed with the patient today.  ROS: As per HPI, otherwise no specific complaints on a limited and focused system review   Objective  Virtual encounter, vitals not  obtained.  There is no height or weight on file to calculate BMI.  Physical Exam   Appears in NAD via conversation, pleasant Breathing : No obvious respiratory distress. Speaking in complete sentences Neurological: Pt is alert Speech is normal Psychiatric: Patient has a normal mood and affect, Judgment and thought content normal.   No results found for this or any previous visit (from the past 72 hour(s)).  PHQ2/9: Depression screen [facility] 2/9 08/10/2020 07/08/2020 04/24/2020 12/20/2019 10/18/2019  Decreased Interest 0 0 0 0 0  Down, Depressed, Hopeless 0 1 0 0 0  PHQ - 2 Score 0 1 0 0 0  Altered sleeping - 3 0 0 0  Tired, decreased energy - 3 0 2 1  Change in appetite - 0 0 0 0  Feeling bad or failure about yourself  - 0 0 0 0  Trouble concentrating - 0 0 0 0  Moving slowly or fidgety/restless - 0 0 0 0  Suicidal thoughts - 0 0 0 0  PHQ-9 Score - 7 0 2 1  Difficult doing work/chores - Not difficult at all Not difficult at all Not difficult at all Not difficult at all  Some recent data might be hidden   PHQ-2/9 Result reviewed  Fall Risk: Fall Risk  08/10/2020 07/08/2020 04/24/2020 12/20/2019 10/18/2019  Falls in the past year? 1 1 0 0 0  Number falls in past yr: 0 0 0 0 0  Injury with Fall? 0 0 0 0 0  Risk Factor Category  - - - - -  Risk for fall due to : - - - - -  Follow up - - - - -     Assessment & Plan  1. Cough 2. Acute rhinitis/?  3. Otitis of left ear  She notes that her symptoms of cough improved after the prednisone was initiated, although still has some mild intermittent cough.  No fevers, not productive of discolored mucus.  No shortness of breath.  Still complains of some nasal congestion, not marked, with  the persistence of this left ear pain more problematic.  Noted would be ideal to have her in the office so I could assess with an examination. She at times feels some discomfort in the node area beneath the ear and down the neck, only intermittently.  No pain  tugging on the low.  Feels it deeper inside the ear. Discussed option, and not opposed to trying an antibiotic to assess her response, although emphasized if not improving or more problematic, the importance of having an in person follow-up and she was understanding of that. Given her allergy to penicillin, initially entertained a cephalosporin, although she did have a history of answering yes to one of the 4 question when entertain cephalosporins due to the cross-reactivity. Felt best then to add a Z-Pak, take as directed. Also can use Tylenol products as needed for discomfort.  4. Paroxysmal atrial fibrillation (HCC) 5. Chronic anticoagulation She now plans to have the ablation procedure after Christmas, and continues to follow with cardiology.   I discussed the assessment and treatment plan with the patient. The patient was provided an opportunity to ask questions and all were answered. The patient agreed with the plan and demonstrated an understanding of the instructions.    The patient was advised to call back or seek an in-person evaluation if the symptoms worsen or if the condition fails to improve as anticipated.  I provided 15 minutes of non-face-to-face time during this encounter that included discussing at length patient's sx/history, pertinent pmhx, medications, treatment and follow up plan. This time also included the necessary documentation, orders, and chart review.  Towanda Malkin, MD

## 2020-08-10 ENCOUNTER — Ambulatory Visit (INDEPENDENT_AMBULATORY_CARE_PROVIDER_SITE_OTHER): Payer: Medicare Other | Admitting: Internal Medicine

## 2020-08-10 DIAGNOSIS — I480 Paroxysmal atrial fibrillation: Secondary | ICD-10-CM

## 2020-08-10 DIAGNOSIS — R059 Cough, unspecified: Secondary | ICD-10-CM

## 2020-08-10 DIAGNOSIS — H6692 Otitis media, unspecified, left ear: Secondary | ICD-10-CM

## 2020-08-10 DIAGNOSIS — Z7901 Long term (current) use of anticoagulants: Secondary | ICD-10-CM

## 2020-08-10 DIAGNOSIS — J00 Acute nasopharyngitis [common cold]: Secondary | ICD-10-CM

## 2020-08-10 MED ORDER — AZITHROMYCIN 250 MG PO TABS: ORAL_TABLET | ORAL | 0 refills | Status: DC

## 2020-08-24 ENCOUNTER — Ambulatory Visit: Payer: 59 | Admitting: Internal Medicine

## 2020-09-11 ENCOUNTER — Other Ambulatory Visit: Payer: Self-pay

## 2020-09-11 DIAGNOSIS — J45901 Unspecified asthma with (acute) exacerbation: Secondary | ICD-10-CM

## 2020-09-11 MED ORDER — BREO ELLIPTA 100-25 MCG/INH IN AEPB: INHALATION_SPRAY | RESPIRATORY_TRACT | 11 refills | Status: DC

## 2020-09-23 DIAGNOSIS — Z20822 Contact with and (suspected) exposure to covid-19: Secondary | ICD-10-CM | POA: Insufficient documentation

## 2020-09-29 ENCOUNTER — Other Ambulatory Visit: Payer: Self-pay | Admitting: Internal Medicine

## 2020-09-29 DIAGNOSIS — R059 Cough, unspecified: Secondary | ICD-10-CM

## 2020-09-29 DIAGNOSIS — J45901 Unspecified asthma with (acute) exacerbation: Secondary | ICD-10-CM

## 2020-09-29 NOTE — Telephone Encounter (Signed)
Please see previous encounter

## 2020-09-29 NOTE — Telephone Encounter (Signed)
Requested medication (s) are due for refill today: na  Requested medication (s) are on the active medication list: yes   Last refill:  07/08/20 #14 0 refills   Future visit scheduled: no  Notes to clinic:  not delegated per protocol, patient requesting tessalon perles that was expired on   05/10/2019.    Requested Prescriptions  Pending Prescriptions Disp Refills   predniSONE (DELTASONE) 10 MG tablet 14 tablet 0    Sig: Take four tabs daily X 2 days, then two tabs daily X 2 days, then one tab daily X 2 days      Not Delegated - Endocrinology:  Oral Corticosteroids Failed - 09/29/2020  5:41 PM      Failed - This refill cannot be delegated      Failed - Last BP in normal range    BP Readings from Last 1 Encounters:  06/29/20 (!) 156/83          Passed - Valid encounter within last 6 months    Recent Outpatient Visits           1 month ago Cough   [facility] Towanda Malkin, MD   2 months ago Cough   [facility] Towanda Malkin, MD   5 months ago Cough   [facility] Lebron Conners D, MD   9 months ago Left lower quadrant abdominal pain   [facility] Lebron Conners D, MD   11 months ago Left lower quadrant abdominal pain   [facility] Towanda Malkin, MD

## 2020-09-29 NOTE — Telephone Encounter (Signed)
Medication Refill - Medication: Pt is requesting refill of prednisone and whatever cough medicine Dr. Roxan Hockey prescribed before. Pt is experiencing drainage in her throat and her ENT does not have availability until a few weeks away. Please advise   Has the patient contacted their pharmacy? No. (Agent: If no, request that the patient contact the pharmacy for the refill.) (Agent: If yes, when and what did the pharmacy advise?)  Preferred Pharmacy (with phone number or street name):  [place]  [address] [place] [address]  Phone: [phone] Fax: [phone]     Agent: Please be advised that RX refills may take up to 3 business days. We ask that you follow-up with your pharmacy.

## 2020-10-08 ENCOUNTER — Ambulatory Visit: Payer: Medicare Other | Admitting: Family Medicine

## 2020-10-30 ENCOUNTER — Encounter: Payer: Self-pay | Admitting: Emergency Medicine

## 2020-10-30 ENCOUNTER — Emergency Department: Payer: Medicare Other

## 2020-10-30 ENCOUNTER — Emergency Department
Admission: EM | Admit: 2020-10-30 | Discharge: 2020-10-30 | Disposition: A | Discharge status: Home / Self Care | Payer: Medicare Other | Attending: Emergency Medicine | Admitting: Emergency Medicine

## 2020-10-30 ENCOUNTER — Other Ambulatory Visit: Payer: Self-pay

## 2020-10-30 DIAGNOSIS — K219 Gastro-esophageal reflux disease without esophagitis: Secondary | ICD-10-CM | POA: Diagnosis not present

## 2020-10-30 DIAGNOSIS — Z7901 Long term (current) use of anticoagulants: Secondary | ICD-10-CM | POA: Diagnosis not present

## 2020-10-30 DIAGNOSIS — J4531 Mild persistent asthma with (acute) exacerbation: Secondary | ICD-10-CM | POA: Insufficient documentation

## 2020-10-30 DIAGNOSIS — E039 Hypothyroidism, unspecified: Secondary | ICD-10-CM | POA: Insufficient documentation

## 2020-10-30 DIAGNOSIS — R1032 Left lower quadrant pain: Secondary | ICD-10-CM | POA: Insufficient documentation

## 2020-10-30 DIAGNOSIS — Z79899 Other long term (current) drug therapy: Secondary | ICD-10-CM | POA: Insufficient documentation

## 2020-10-30 DIAGNOSIS — I10 Essential (primary) hypertension: Secondary | ICD-10-CM | POA: Insufficient documentation

## 2020-10-30 DIAGNOSIS — Z853 Personal history of malignant neoplasm of breast: Secondary | ICD-10-CM | POA: Insufficient documentation

## 2020-10-30 DIAGNOSIS — Z7951 Long term (current) use of inhaled steroids: Secondary | ICD-10-CM | POA: Insufficient documentation

## 2020-10-30 LAB — URINALYSIS, COMPLETE (UACMP) WITH MICROSCOPIC
Bilirubin Urine: NEGATIVE
Glucose, UA: NEGATIVE mg/dL
Hgb urine dipstick: NEGATIVE
Ketones, ur: NEGATIVE mg/dL
Leukocytes,Ua: NEGATIVE
Nitrite: NEGATIVE
Protein, ur: NEGATIVE mg/dL
Specific Gravity, Urine: 1.018 (ref 1.005–1.030)
pH: 5 (ref 5.0–8.0)

## 2020-10-30 LAB — COMPREHENSIVE METABOLIC PANEL
ALT: 13 U/L (ref 0–44)
AST: 18 U/L (ref 15–41)
Albumin: 3.6 g/dL (ref 3.5–5.0)
Alkaline Phosphatase: 80 U/L (ref 38–126)
Anion gap: 5 (ref 5–15)
BUN: 16 mg/dL (ref 8–23)
CO2: 27 mmol/L (ref 22–32)
Calcium: 9 mg/dL (ref 8.9–10.3)
Chloride: 106 mmol/L (ref 98–111)
Creatinine, Ser: 1 mg/dL (ref 0.44–1.00)
GFR, Estimated: 59 mL/min — ABNORMAL LOW (ref >60)
Glucose, Bld: 129 mg/dL — ABNORMAL HIGH (ref 70–99)
Potassium: 4.2 mmol/L (ref 3.5–5.1)
Sodium: 138 mmol/L (ref 135–145)
Total Bilirubin: 1 mg/dL (ref 0.3–1.2)
Total Protein: 6.8 g/dL (ref 6.5–8.1)

## 2020-10-30 LAB — CBC
HCT: 42.6 % (ref 36.0–46.0)
Hemoglobin: 13.8 g/dL (ref 12.0–15.0)
MCH: 29.1 pg (ref 26.0–34.0)
MCHC: 32.4 g/dL (ref 30.0–36.0)
MCV: 89.7 fL (ref 80.0–100.0)
Platelets: 211 10*3/uL (ref 150–400)
RBC: 4.75 MIL/uL (ref 3.87–5.11)
RDW: 14.2 % (ref 11.5–15.5)
WBC: 5.2 10*3/uL (ref 4.0–10.5)
nRBC: 0 % (ref 0.0–0.2)

## 2020-10-30 LAB — LIPASE, BLOOD: Lipase: 28 U/L (ref 11–51)

## 2020-10-30 MED ORDER — IOHEXOL 300 MG/ML  SOLN
100.0000 mL | Freq: Once | INTRAMUSCULAR | Status: AC | PRN
  Administered 2020-10-30: 100 mL via INTRAVENOUS

## 2020-10-30 NOTE — Discharge Instructions (Addendum)
Your blood test, urine test, and CT scan today were all okay.  We were unable to find a cause for your pain, but your evaluation is reassuring.  Please follow-up with your doctor for continued monitoring of your symptoms.

## 2020-10-30 NOTE — ED Triage Notes (Signed)
Pt to ED via POV stating that she has been having LLQ abdominal pain for 3 weeks. Pt states that the pain comes and goes. Pt states that the pain does not radiate. Pt has had a few episodes of vomiting. Pt is in NAD.

## 2020-10-30 NOTE — ED Provider Notes (Signed)
[facility] Emergency Department Provider Note  ____________________________________________  Time seen: Approximately 4:16 PM  I have reviewed the triage vital signs and the nursing notes.   HISTORY  Chief Complaint Abdominal Pain    HPI Sonya Harper is a 75 y.o. female with a history of atrial fibrillation on Eliquis,  hypertension hypothyroidism chronic low back pain who comes ED complaining of left lower quadrant abdominal pain for the past 3 weeks.  It is intermittent, worse lying down, better being upright.  No other aggravating or alleviating factors.  Eating and drinking normally.  No diarrhea or constipation.  No hernias.  Does report a history of diverticulosis.  Nonradiating, currently moderate intensity 5/10.     Past Medical History:  Diagnosis Date  . A-fib ([place])   . Allergy   . Arthritis    feet  . Asthma   . Breast neoplasm   . Chronic lower back pain    s/p fall injury  . Depression   . Dysrhythmia   . Fibromyalgia   . Hypertension   . Hypothyroidism   . Lumbar facet arthropathy 11/06/2018  . Prediabetes 02/27/2018  . Sciatica of right side   . Wears dentures    partial upper and lower     Patient Active Problem List   Diagnosis Date Noted  . Persistent asthma with acute exacerbation 04/24/2020  . Polyp of transverse colon   . Polyp of ascending colon   . Chronic anticoagulation 09/12/2019  . Paroxysmal atrial fibrillation ([address]) 09/12/2019  . AKI (acute kidney injury) ([place]) 06/26/2019  . Grade I diastolic dysfunction 41/66/0630  . Mild mitral regurgitation 06/26/2019  . Mild tricuspid regurgitation 06/26/2019  . Lumbar facet arthropathy 11/06/2018  . Prediabetes 02/27/2018  . Medication monitoring encounter 11/13/2017  . Abnormal laboratory test 10/16/2017  . Palpitations 08/23/2017  . Breast pain in female 08/10/2017  . Obesity (BMI 30.0-34.9) 08/10/2017  . Protein in urine 08/10/2017  . Vaginal atrophy  08/10/2017  . Benign essential HTN 11/16/2016  . Hyperlipidemia, mixed 11/16/2016  . SOBOE (shortness of breath on exertion) 11/16/2016  . Stable angina pectoris ([place]) 11/16/2016  . Dysrhythmia 07/07/2016  . Right low back pain 07/07/2016  . Fall on or from stairs or steps 07/07/2016  . Family history of malignant neoplasm of gastrointestinal tract   . Benign neoplasm of ascending colon   . Benign neoplasm of transverse colon   . Benign neoplasm of descending colon   . Goiter 03/03/2015  . Asthma, mild persistent 03/03/2015  . Gastroesophageal reflux disease without esophagitis 03/03/2015     Past Surgical History:  Procedure Laterality Date  . ABDOMINAL HYSTERECTOMY    . COLONOSCOPY WITH PROPOFOL N/A 05/13/2016   Procedure: COLONOSCOPY WITH PROPOFOL;  Surgeon: Lucilla Lame, MD;  Location: [place];  Service: Endoscopy;  Laterality: N/A;  . COLONOSCOPY WITH PROPOFOL N/A 09/20/2019   Procedure: COLONOSCOPY WITH PROPOFOL;  Surgeon: Lucilla Lame, MD;  Location: [facility] ENDOSCOPY;  Service: Endoscopy;  Laterality: N/A;  . ESOPHAGOGASTRODUODENOSCOPY (EGD) WITH PROPOFOL N/A 09/20/2019   Procedure: ESOPHAGOGASTRODUODENOSCOPY (EGD) WITH PROPOFOL;  Surgeon: Lucilla Lame, MD;  Location: [facility] ENDOSCOPY;  Service: Endoscopy;  Laterality: N/A;  . POLYPECTOMY N/A 05/13/2016   Procedure: POLYPECTOMY;  Surgeon: Lucilla Lame, MD;  Location: [place];  Service: Endoscopy;  Laterality: N/A;     Prior to Admission medications   Medication Sig Start Date End Date Taking? Authorizing Provider  albuterol (VENTOLIN HFA) 108 (90 Base) MCG/ACT inhaler INHALE  2 PUFFS EVERY 6 HOURS AS NEEDED FOR SHORTNESS OF BREATH 07/02/20   Towanda Malkin, MD  amLODipine (NORVASC) 5 MG tablet Take by mouth. 07/19/19   [provider]  apixaban (ELIQUIS) 5 MG TABS tablet Take by mouth. 07/18/19   [provider]  atorvastatin (LIPITOR) 10 MG tablet Take 1 tablet (10 mg total) by mouth  every other day. 12/29/18   Arnetha Courser, MD  azithromycin (ZITHROMAX) 250 MG tablet Take 2 tablets on day 1, then 1 tablet daily for the next 4 days 08/10/20   Towanda Malkin, MD  Cholecalciferol (VITAMIN D-1000 MAX ST) 25 MCG (1000 UT) tablet Take by mouth.    [provider]  EPINEPHrine (EPIPEN 2-PAK) 0.3 mg/0.3 mL IJ SOAJ injection Use per package directions; one injection subcutaneously for LIFE-THREATENING emergency, deadly reaction 05/16/18   Lada, Satira Anis, MD  fluticasone (FLONASE) 50 MCG/ACT nasal spray Place 2 sprays into both nostrils daily. 12/11/18   Poulose, Bethel Born, NP  fluticasone furoate-vilanterol (BREO ELLIPTA) 100-25 MCG/INH AEPB INHALE ONE DOSE BY MOUTH DAILY 09/11/20   Towanda Malkin, MD  nitroGLYCERIN (NITROSTAT) 0.4 MG SL tablet Place 1 tablet (0.4 mg total) under the tongue every 5 (five) minutes as needed for chest pain. Max 3 pills per episode 08/30/18   Arnetha Courser, MD  ondansetron (ZOFRAN ODT) 4 MG disintegrating tablet Take 1 tablet (4 mg total) by mouth every 8 (eight) hours as needed for nausea or vomiting. 06/29/20   Duffy Bruce, MD  pantoprazole (PROTONIX) 40 MG tablet Take 40 mg by mouth daily.    [provider]  predniSONE (DELTASONE) 10 MG tablet Take four tabs daily X 2 days, then two tabs daily X 2 days, then one tab daily X 2 days 07/08/20   Towanda Malkin, MD  SYNTHROID 50 MCG tablet TAKE 1 BY MOUTH DAILY. DO NOT SUBSTITUTE 12/04/14   [provider]     Allergies Molds & smuts and Penicillins   Family History  Problem Relation Age of Onset  . Stroke Mother   . Breast cancer Sister 39       breast ca x3  . Colon cancer Paternal Aunt   . Cancer Sister     Social History Social History   Tobacco Use  . Smoking status: Never Smoker  . Smokeless tobacco: Never Used  Vaping Use  . Vaping Use: Never used  Substance Use Topics  . Alcohol use: No    Alcohol/week: 0.0 standard drinks   . Drug use: Never    Review of Systems  Constitutional:   No fever or chills.  ENT:   No sore throat. No rhinorrhea. Cardiovascular:   No chest pain or syncope. Respiratory:   No dyspnea or cough. Gastrointestinal:   Positive as above for abdominal pain without vomiting and diarrhea.  Musculoskeletal:   Negative for focal pain or swelling All other systems reviewed and are negative except as documented above in ROS and HPI.  ____________________________________________   PHYSICAL EXAM:  VITAL SIGNS: ED Triage Vitals  Enc Vitals Group     BP 10/30/20 1306 (!) 149/73     Pulse Rate 10/30/20 1306 (!) 59     Resp 10/30/20 1306 16     Temp 10/30/20 1309 98.4 F (36.9 C)     Temp Source 10/30/20 1309 Oral     SpO2 10/30/20 1306 99 %     Weight 10/30/20 1307 178 lb (80.7 kg)  Height 10/30/20 1307 5\' 5"  (1.651 m)     Head Circumference --      Peak Flow --      Pain Score 10/30/20 1307 8     Pain Loc --      Pain Edu? --      Excl. in [place]? --     Vital signs reviewed, nursing assessments reviewed.   Constitutional:   Alert and oriented. Non-toxic appearance. Eyes:   Conjunctivae are normal. EOMI. PERRL. ENT      Head:   Normocephalic and atraumatic.      Nose:   Wearing a mask.      Mouth/Throat:   Wearing a mask.      Neck:   No meningismus. Full ROM. Hematological/Lymphatic/Immunilogical:   No cervical lymphadenopathy. Cardiovascular:   RRR. Symmetric bilateral radial and DP pulses.  No murmurs. Cap refill less than 2 seconds. Respiratory:   Normal respiratory effort without tachypnea/retractions. Breath sounds are clear and equal bilaterally. No wheezes/rales/rhonchi. Gastrointestinal:   Soft with mild left lower quadrant tenderness. Non distended. There is no CVA tenderness.  No rebound, rigidity, or guarding.  Musculoskeletal:   Normal range of motion in all extremities. No joint effusions.  No lower extremity tenderness.  No edema. Neurologic:   Normal speech  and language.  Motor grossly intact. No acute focal neurologic deficits are appreciated.  Skin:    Skin is warm, dry and intact. No rash noted.  No petechiae, purpura, or bullae.  ____________________________________________    LABS (pertinent positives/negatives) (all labs ordered are listed, but only abnormal results are displayed) Labs Reviewed  COMPREHENSIVE METABOLIC PANEL - Abnormal; Notable for the following components:      Result Value   Glucose, Bld 129 (*)    GFR, Estimated 59 (*)    All other components within normal limits  URINALYSIS, COMPLETE (UACMP) WITH MICROSCOPIC - Abnormal; Notable for the following components:   Color, Urine YELLOW (*)    APPearance HAZY (*)    Bacteria, UA RARE (*)    All other components within normal limits  LIPASE, BLOOD  CBC   ____________________________________________   EKG    ____________________________________________    RADIOLOGY  CT ABDOMEN PELVIS W CONTRAST  Result Date: 10/30/2020 CLINICAL DATA:  Acute left lower quadrant abdominal pain. EXAM: CT ABDOMEN AND PELVIS WITH CONTRAST TECHNIQUE: Multidetector CT imaging of the abdomen and pelvis was performed using the standard protocol following bolus administration of intravenous contrast. CONTRAST:  135mL OMNIPAQUE IOHEXOL 300 MG/ML  SOLN COMPARISON:  August 27, 2019. FINDINGS: Lower chest: No acute abnormality. Hepatobiliary: No focal liver abnormality is seen. No gallstones, gallbladder wall thickening, or biliary dilatation. Pancreas: Unremarkable. No pancreatic ductal dilatation or surrounding inflammatory changes. Spleen: Normal in size without focal abnormality. Adrenals/Urinary Tract: Adrenal glands appear normal. Stable left renal cyst is noted. No hydronephrosis or renal obstruction is noted. No renal or ureteral calculi are noted. Urinary bladder is unremarkable. Stomach/Bowel: Stomach is within normal limits. Appendix appears normal. No evidence of bowel wall  thickening, distention, or inflammatory changes. Vascular/Lymphatic: No significant vascular findings are present. No enlarged abdominal or pelvic lymph nodes. Reproductive: Status post hysterectomy. No adnexal masses. Other: No abdominal wall hernia or abnormality. No abdominopelvic ascites. Musculoskeletal: No acute or significant osseous findings. IMPRESSION: 1. Stable left renal cyst. No hydronephrosis or renal obstruction is noted. 2. No renal or ureteral calculi are noted. No acute abnormality seen in the abdomen or pelvis. Electronically Signed   By: Jeneen Rinks  Murlean Caller M.D.   On: 10/30/2020 16:25    ____________________________________________   PROCEDURES Procedures  ____________________________________________  DIFFERENTIAL DIAGNOSIS   Diverticulitis, hernia, bowel obstruction, constipation, musculoskeletal pain  CLINICAL IMPRESSION / ASSESSMENT AND PLAN / ED COURSE  Medications ordered in the ED: Medications  iohexol (OMNIPAQUE) 300 MG/ML solution 100 mL (100 mLs Intravenous Contrast Given 10/30/20 1604)    Pertinent labs & imaging results that were available during my care of the patient were reviewed by me and considered in my medical decision making (see chart for details).  Sonya Harper was evaluated in Emergency Department on 10/30/2020 for the symptoms described in the history of present illness. She was evaluated in the context of the global COVID-19 pandemic, which necessitated consideration that the patient might be at risk for infection with the SARS-CoV-2 virus that causes COVID-19. Institutional protocols and algorithms that pertain to the evaluation of patients at risk for COVID-19 are in a state of rapid change based on information released by regulatory bodies including the CDC and federal and state organizations. These policies and algorithms were followed during the patient's care in the ED.   Patient presents with intermittent left lower quadrant pain for 3 weeks.   Vital signs unremarkable, patient is nontoxic and overall reassuring exam but with age, comorbidities, abdominal tenderness, will obtain a CT scan.  Doubt mesenteric ischemia, dissection, vascular occlusion.  Declines pain medicine or nausea medicine right now, appears well-hydrated.  Clinical Course as of 10/30/20 1646  Fri Oct 30, 2020  1644 CT scan unremarkable.  Work-up negative and reassuring, vital signs normal.  Will discharge to follow-up with primary care. [PS]    Clinical Course User Index [PS] Carrie Mew, MD     ____________________________________________   FINAL CLINICAL IMPRESSION(S) / ED DIAGNOSES    Final diagnoses:  Left lower quadrant abdominal pain     ED Discharge Orders    None      Portions of this note were generated with dragon dictation software. Dictation errors may occur despite best attempts at proofreading.   Carrie Mew, MD 10/30/20 [id number]

## 2020-12-15 ENCOUNTER — Inpatient Hospital Stay: Payer: Medicare Other | Admitting: Physician Assistant

## 2021-01-19 IMAGING — CR DG CHEST 2V
1 series · 2 of 2 positions shown · non-contrast
Comparison: December 03, 2017

CLINICAL DATA: Shortness of breath and dizziness.

EXAM:
CHEST - 2 VIEW

[Series 1: dg chest 2 view · 0.14mm/px · 2 of 2 slices shown]
[im 1/2]
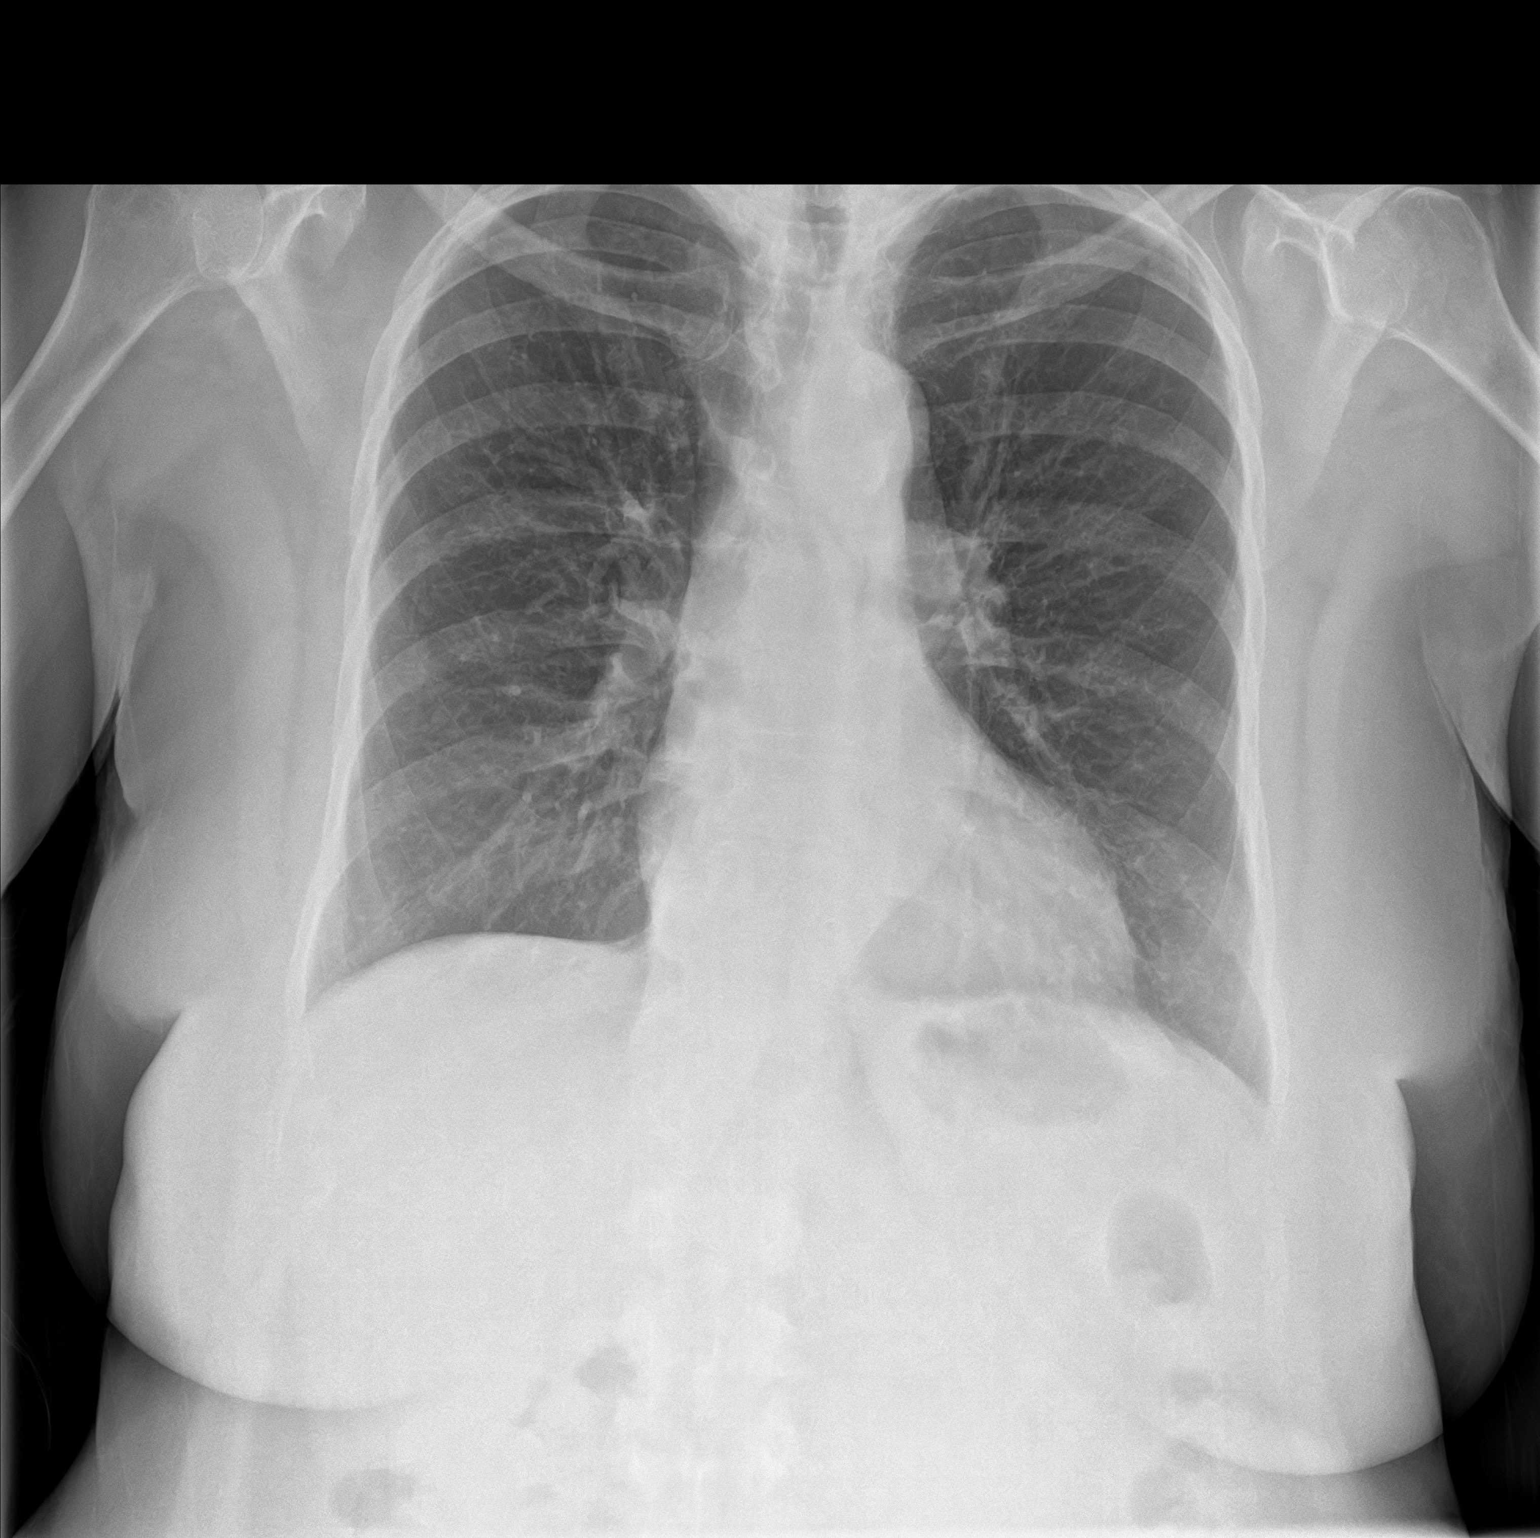
[im 2/2]
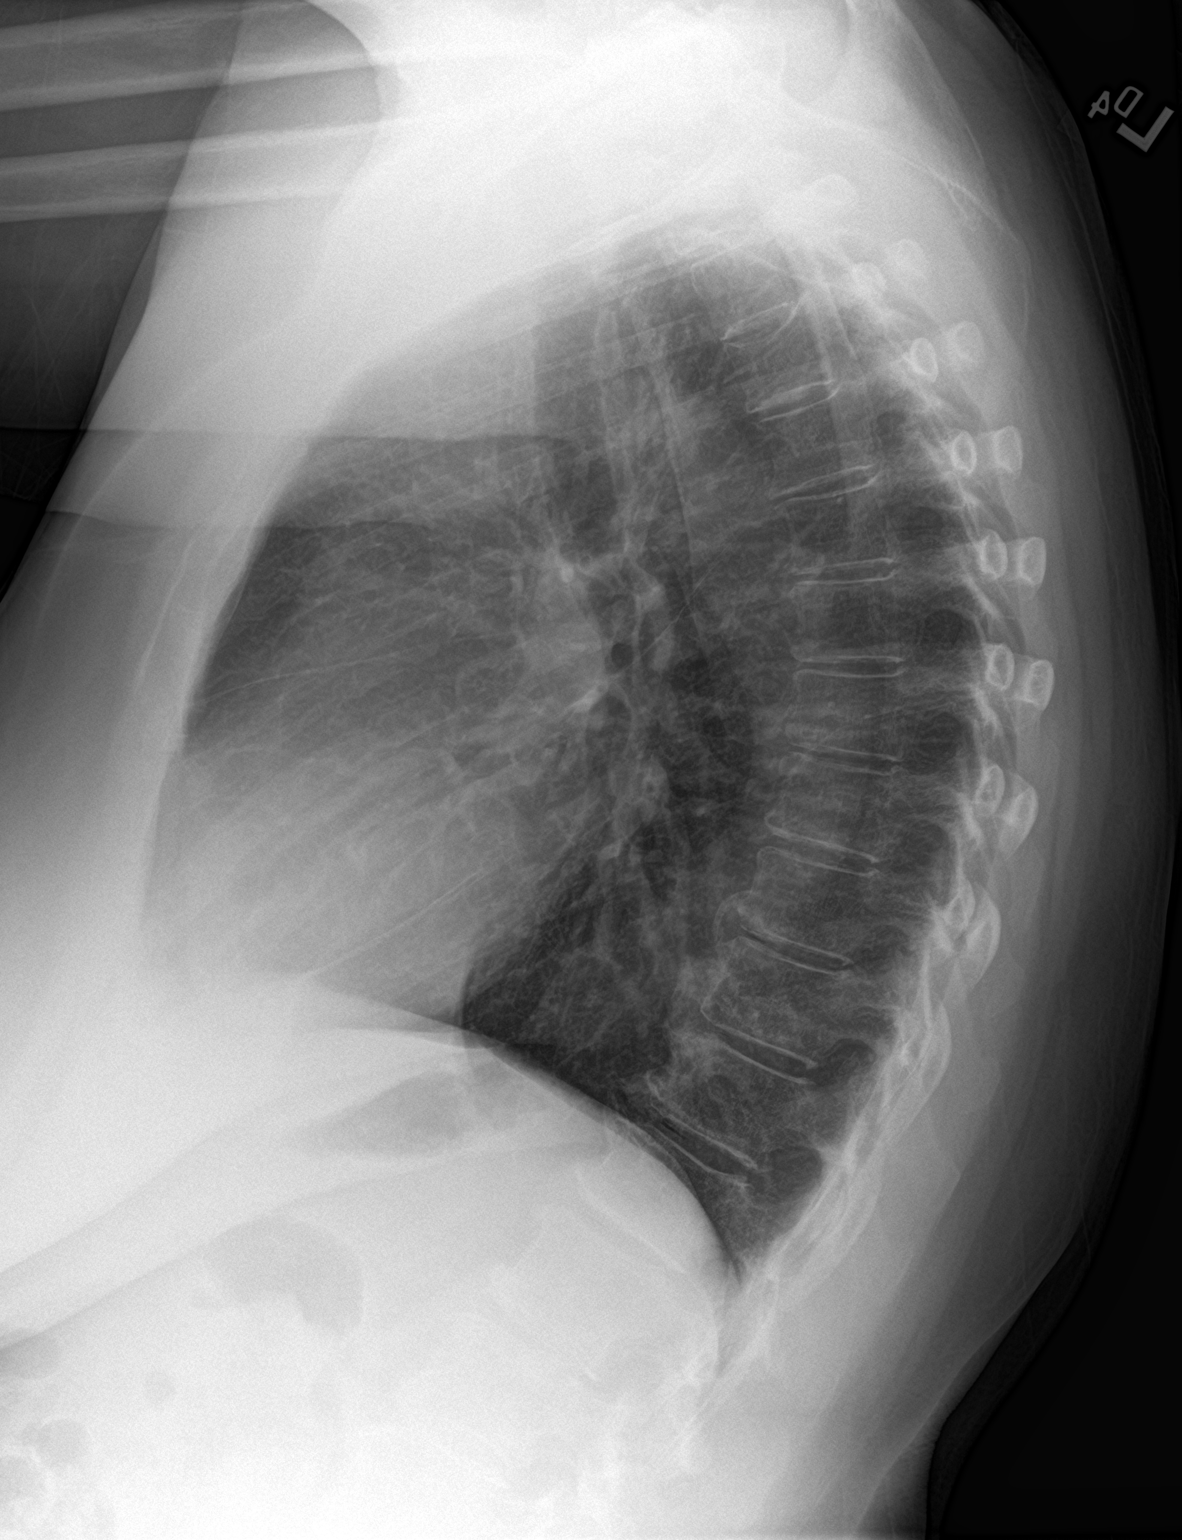

[2 of 2 positions shown; findings below may reference images not displayed]

FINDINGS: The heart size and mediastinal contours are within normal limits.
Tortuosity of the descending thoracic aorta is noted. Both lungs are
clear. The visualized skeletal structures are unremarkable.
IMPRESSION: No active cardiopulmonary disease.

## 2021-02-08 ENCOUNTER — Other Ambulatory Visit: Payer: Self-pay

## 2021-02-08 ENCOUNTER — Emergency Department: Payer: Medicare Other

## 2021-02-08 DIAGNOSIS — Z85038 Personal history of other malignant neoplasm of large intestine: Secondary | ICD-10-CM | POA: Insufficient documentation

## 2021-02-08 DIAGNOSIS — R059 Cough, unspecified: Secondary | ICD-10-CM | POA: Diagnosis present

## 2021-02-08 DIAGNOSIS — I10 Essential (primary) hypertension: Secondary | ICD-10-CM | POA: Diagnosis not present

## 2021-02-08 DIAGNOSIS — J45909 Unspecified asthma, uncomplicated: Secondary | ICD-10-CM | POA: Insufficient documentation

## 2021-02-08 DIAGNOSIS — Z7951 Long term (current) use of inhaled steroids: Secondary | ICD-10-CM | POA: Diagnosis not present

## 2021-02-08 DIAGNOSIS — Z853 Personal history of malignant neoplasm of breast: Secondary | ICD-10-CM | POA: Diagnosis not present

## 2021-02-08 DIAGNOSIS — Z79899 Other long term (current) drug therapy: Secondary | ICD-10-CM | POA: Diagnosis not present

## 2021-02-08 DIAGNOSIS — J9801 Acute bronchospasm: Secondary | ICD-10-CM | POA: Diagnosis not present

## 2021-02-08 DIAGNOSIS — Z7901 Long term (current) use of anticoagulants: Secondary | ICD-10-CM | POA: Insufficient documentation

## 2021-02-08 DIAGNOSIS — E039 Hypothyroidism, unspecified: Secondary | ICD-10-CM | POA: Diagnosis not present

## 2021-02-08 LAB — CBC
HCT: 40.9 % (ref 36.0–46.0)
Hemoglobin: 13.4 g/dL (ref 12.0–15.0)
MCH: 28.9 pg (ref 26.0–34.0)
MCHC: 32.8 g/dL (ref 30.0–36.0)
MCV: 88.3 fL (ref 80.0–100.0)
Platelets: 237 10*3/uL (ref 150–400)
RBC: 4.63 MIL/uL (ref 3.87–5.11)
RDW: 13.4 % (ref 11.5–15.5)
WBC: 7.1 10*3/uL (ref 4.0–10.5)
nRBC: 0 % (ref 0.0–0.2)

## 2021-02-08 NOTE — ED Triage Notes (Signed)
Pt in with co [facility], hx of asthma. Has been using inhalers at home without relief. No distress noted at this time, does have a productive cough. Denies any fever.

## 2021-02-09 ENCOUNTER — Emergency Department
Admission: EM | Admit: 2021-02-09 | Discharge: 2021-02-09 | Disposition: A | Discharge status: Home / Self Care | Payer: Medicare Other | Attending: Emergency Medicine | Admitting: Emergency Medicine

## 2021-02-09 DIAGNOSIS — J9801 Acute bronchospasm: Secondary | ICD-10-CM | POA: Diagnosis not present

## 2021-02-09 LAB — TROPONIN I (HIGH SENSITIVITY)
Troponin I (High Sensitivity): 13 ng/L (ref <18)
Troponin I (High Sensitivity): 14 ng/L (ref <18)

## 2021-02-09 LAB — COMPREHENSIVE METABOLIC PANEL
ALT: 12 U/L (ref 0–44)
AST: 19 U/L (ref 15–41)
Albumin: 3.9 g/dL (ref 3.5–5.0)
Alkaline Phosphatase: 99 U/L (ref 38–126)
Anion gap: 8 (ref 5–15)
BUN: 12 mg/dL (ref 8–23)
CO2: 26 mmol/L (ref 22–32)
Calcium: 8.8 mg/dL — ABNORMAL LOW (ref 8.9–10.3)
Chloride: 105 mmol/L (ref 98–111)
Creatinine, Ser: 0.91 mg/dL (ref 0.44–1.00)
GFR, Estimated: >60 mL/min (ref >60)
Glucose, Bld: 118 mg/dL — ABNORMAL HIGH (ref 70–99)
Potassium: 3.7 mmol/L (ref 3.5–5.1)
Sodium: 139 mmol/L (ref 135–145)
Total Bilirubin: 0.6 mg/dL (ref 0.3–1.2)
Total Protein: 6.9 g/dL (ref 6.5–8.1)

## 2021-02-09 MED ORDER — HYDROCOD POLST-CPM POLST ER 10-8 MG/5ML PO SUER: 5.0000 mL | Freq: Every evening | ORAL | 0 refills | Status: DC | PRN

## 2021-02-09 MED ORDER — BENADRYL ITCH STOPPING 2 % EX GEL
1.0000 | Freq: Three times a day (TID) | CUTANEOUS | 1 refills | Status: AC
Start: 2021-02-09 — End: ?

## 2021-02-09 MED ORDER — ALBUTEROL SULFATE HFA 108 (90 BASE) MCG/ACT IN AERS: 2.0000 | INHALATION_SPRAY | Freq: Four times a day (QID) | RESPIRATORY_TRACT | 2 refills | Status: DC | PRN

## 2021-02-09 MED ORDER — BENZONATATE 100 MG PO CAPS: 200.0000 mg | ORAL_CAPSULE | Freq: Three times a day (TID) | ORAL | 0 refills | Status: DC | PRN

## 2021-02-09 NOTE — ED Provider Notes (Signed)
[facility] Emergency Department Provider Note   ____________________________________________   Event Date/Time   First MD Initiated Contact with Patient 02/09/21 0720     (approximate)  I have reviewed the triage vital signs and the nursing notes.   HISTORY  Chief Complaint Shortness of Breath    HPI Sonya Harper is a 75 y.o. female patient complain approximately 1 week of productive cough, mild dyspnea, and shortness of breath.  Denies chest pain.  Patient has a history of asthma.  Patient states that no relief with inhaler.  Patient has history of A. fib and takes Eliquis.         Past Medical History:  Diagnosis Date  . A-fib ([place])   . Allergy   . Arthritis    feet  . Asthma   . Breast neoplasm   . Chronic lower back pain    s/p fall injury  . Depression   . Dysrhythmia   . Fibromyalgia   . Hypertension   . Hypothyroidism   . Lumbar facet arthropathy 11/06/2018  . Prediabetes 02/27/2018  . Sciatica of right side   . Wears dentures    partial upper and lower    Patient Active Problem List   Diagnosis Date Noted  . Persistent asthma with acute exacerbation 04/24/2020  . Polyp of transverse colon   . Polyp of ascending colon   . Chronic anticoagulation 09/12/2019  . Paroxysmal atrial fibrillation ([place]) 09/12/2019  . AKI (acute kidney injury) ([place]) 06/26/2019  . Grade I diastolic dysfunction 99/37/1696  . Mild mitral regurgitation 06/26/2019  . Mild tricuspid regurgitation 06/26/2019  . Lumbar facet arthropathy 11/06/2018  . Prediabetes 02/27/2018  . Medication monitoring encounter 11/13/2017  . Abnormal laboratory test 10/16/2017  . Palpitations 08/23/2017  . Breast pain in female 08/10/2017  . Obesity (BMI 30.0-34.9) 08/10/2017  . Protein in urine 08/10/2017  . Vaginal atrophy 08/10/2017  . Benign essential HTN 11/16/2016  . Hyperlipidemia, mixed 11/16/2016  . SOBOE (shortness of breath on exertion) 11/16/2016  .  Stable angina pectoris ([place]) 11/16/2016  . Dysrhythmia 07/07/2016  . Right low back pain 07/07/2016  . Fall on or from stairs or steps 07/07/2016  . Family history of malignant neoplasm of gastrointestinal tract   . Benign neoplasm of ascending colon   . Benign neoplasm of transverse colon   . Benign neoplasm of descending colon   . Goiter 03/03/2015  . Asthma, mild persistent 03/03/2015  . Gastroesophageal reflux disease without esophagitis 03/03/2015    Past Surgical History:  Procedure Laterality Date  . ABDOMINAL HYSTERECTOMY    . COLONOSCOPY WITH PROPOFOL N/A 05/13/2016   Procedure: COLONOSCOPY WITH PROPOFOL;  Surgeon: Lucilla Lame, MD;  Location: [place];  Service: Endoscopy;  Laterality: N/A;  . COLONOSCOPY WITH PROPOFOL N/A 09/20/2019   Procedure: COLONOSCOPY WITH PROPOFOL;  Surgeon: Lucilla Lame, MD;  Location: [facility] ENDOSCOPY;  Service: Endoscopy;  Laterality: N/A;  . ESOPHAGOGASTRODUODENOSCOPY (EGD) WITH PROPOFOL N/A 09/20/2019   Procedure: ESOPHAGOGASTRODUODENOSCOPY (EGD) WITH PROPOFOL;  Surgeon: Lucilla Lame, MD;  Location: [facility] ENDOSCOPY;  Service: Endoscopy;  Laterality: N/A;  . POLYPECTOMY N/A 05/13/2016   Procedure: POLYPECTOMY;  Surgeon: Lucilla Lame, MD;  Location: [facility];  Service: Endoscopy;  Laterality: N/A;    Prior to Admission medications   Medication Sig Start Date End Date Taking? Authorizing Provider  albuterol (VENTOLIN HFA) 108 (90 Base) MCG/ACT inhaler Inhale 2 puffs into the lungs every 6 (six) hours as needed for wheezing  or shortness of breath. 02/09/21  Yes Sable Feil, PA-C  benzonatate (TESSALON PERLES) 100 MG capsule Take 2 capsules (200 mg total) by mouth 3 (three) times daily as needed. 02/09/21 02/09/22 Yes Sable Feil, PA-C  chlorpheniramine-HYDROcodone (TUSSIONEX PENNKINETIC ER) 10-8 MG/5ML SUER Take 5 mLs by mouth at bedtime as needed for cough. 02/09/21  Yes Sable Feil, PA-C  DIPHENHYDRAMINE HCL, TOPICAL, (BENADRYL ITCH  STOPPING) 2 % GEL Apply 1 application topically in the morning, at noon, and at bedtime. 02/09/21  Yes Sable Feil, PA-C  albuterol (VENTOLIN HFA) 108 (90 Base) MCG/ACT inhaler INHALE 2 PUFFS EVERY 6 HOURS AS NEEDED FOR SHORTNESS OF BREATH 07/02/20   Towanda Malkin, MD  amLODipine (NORVASC) 5 MG tablet Take by mouth. 07/19/19   [provider]  apixaban (ELIQUIS) 5 MG TABS tablet Take by mouth. 07/18/19   [provider]  atorvastatin (LIPITOR) 10 MG tablet Take 1 tablet (10 mg total) by mouth every other day. 12/29/18   Arnetha Courser, MD  azithromycin (ZITHROMAX) 250 MG tablet Take 2 tablets on day 1, then 1 tablet daily for the next 4 days 08/10/20   Towanda Malkin, MD  Cholecalciferol (VITAMIN D-1000 MAX ST) 25 MCG (1000 UT) tablet Take by mouth.    [provider]  EPINEPHrine (EPIPEN 2-PAK) 0.3 mg/0.3 mL IJ SOAJ injection Use per package directions; one injection subcutaneously for LIFE-THREATENING emergency, deadly reaction 05/16/18   Lada, Satira Anis, MD  fluticasone (FLONASE) 50 MCG/ACT nasal spray Place 2 sprays into both nostrils daily. 12/11/18   Poulose, Bethel Born, NP  fluticasone furoate-vilanterol (BREO ELLIPTA) 100-25 MCG/INH AEPB INHALE ONE DOSE BY MOUTH DAILY 09/11/20   Towanda Malkin, MD  nitroGLYCERIN (NITROSTAT) 0.4 MG SL tablet Place 1 tablet (0.4 mg total) under the tongue every 5 (five) minutes as needed for chest pain. Max 3 pills per episode 08/30/18   Arnetha Courser, MD  ondansetron (ZOFRAN ODT) 4 MG disintegrating tablet Take 1 tablet (4 mg total) by mouth every 8 (eight) hours as needed for nausea or vomiting. 06/29/20   Duffy Bruce, MD  pantoprazole (PROTONIX) 40 MG tablet Take 40 mg by mouth daily.    [provider]  predniSONE (DELTASONE) 10 MG tablet Take four tabs daily X 2 days, then two tabs daily X 2 days, then one tab daily X 2 days 07/08/20   Towanda Malkin, MD  SYNTHROID 50 MCG tablet TAKE  1 BY MOUTH DAILY. DO NOT SUBSTITUTE 12/04/14   [provider]    Allergies Molds & smuts and Penicillins  Family History  Problem Relation Age of Onset  . Stroke Mother   . Breast cancer Sister 66       breast ca x3  . Colon cancer Paternal Aunt   . Cancer Sister     Social History Social History   Tobacco Use  . Smoking status: Never Smoker  . Smokeless tobacco: Never Used  Vaping Use  . Vaping Use: Never used  Substance Use Topics  . Alcohol use: No    Alcohol/week: 0.0 standard drinks  . Drug use: Never    Review of Systems Constitutional: No fever/chills Eyes: No visual changes. ENT: No sore throat. Cardiovascular: Denies chest pain. Respiratory: shortness of breath with coughing.. Gastrointestinal: No abdominal pain.  No nausea, no vomiting.  No diarrhea.  No constipation. Genitourinary: Negative for dysuria. Musculoskeletal: Negative for back pain. Skin: Negative for rash. Neurological: Negative for headaches,  focal weakness or numbness. Psychiatric:  Depression Endocrine:  Hypertension hypothyroidism Hematological/Lymphatic:  Allergic/Immunilogical: Mold, nuts, penicillin. ____________________________________________   PHYSICAL EXAM:  VITAL SIGNS: ED Triage Vitals  Enc Vitals Group     BP 02/08/21 2335 (!) 168/98     Pulse Rate 02/08/21 2335 88     Resp 02/08/21 2335 20     Temp 02/08/21 2335 98.1 F (36.7 C)     Temp Source 02/08/21 2335 Oral     SpO2 02/08/21 2335 95 %     Weight 02/08/21 2332 183 lb (83 kg)     Height 02/08/21 2332 5\' 5"  (1.651 m)     Head Circumference --      Peak Flow --      Pain Score 02/08/21 2332 0     Pain Loc --      Pain Edu? --      Excl. in [place]? --     Constitutional: Alert and oriented. Well appearing and in no acute distress. Eyes: Conjunctivae are normal. PERRL. EOMI. Head: Atraumatic. Nose: No congestion/rhinnorhea. Mouth/Throat: Mucous membranes are moist.  Oropharynx non-erythematous. Neck:  No stridor.  Hematological/Lymphatic/Immunilogical: No cervical lymphadenopathy. Cardiovascular: Normal rate, regular rhythm. Grossly normal heart sounds.  Good peripheral circulation. Respiratory: Normal respiratory effort.  No retractions. Lungs CTAB. Gastrointestinal: Soft and nontender. No distention. No abdominal bruits. No CVA tenderness. Genitourinary: Deferred Musculoskeletal: No lower extremity tenderness nor edema.  No joint effusions. Neurologic:  Normal speech and language. No gross focal neurologic deficits are appreciated. No gait instability. Skin:  Skin is warm, dry and intact. No rash noted. Psychiatric: Mood and affect are normal. Speech and behavior are normal.  ____________________________________________   LABS (all labs ordered are listed, but only abnormal results are displayed)  Labs Reviewed  COMPREHENSIVE METABOLIC PANEL - Abnormal; Notable for the following components:      Result Value   Glucose, Bld 118 (*)    Calcium 8.8 (*)    All other components within normal limits  CBC  TROPONIN I (HIGH SENSITIVITY)  TROPONIN I (HIGH SENSITIVITY)   ____________________________________________  EKG  Read by heart station Dr. With no acute findings.____________________________________________  [place], personally viewed and evaluated these images (plain radiographs) as part of my medical decision making, as well as reviewing the written report by the radiologist.  ED MD interpretation: No acute findings on chest x-ray. Official radiology report(s): DG Chest 2 View  Result Date: 02/08/2021 CLINICAL DATA:  Shortness of breath. EXAM: CHEST - 2 VIEW COMPARISON:  June 29, 2020 FINDINGS: The heart size and mediastinal contours are within normal limits. There is mild calcification of the aortic arch and tortuosity of the descending thoracic aorta. Both lungs are clear. The visualized skeletal structures are unremarkable. IMPRESSION: No active  cardiopulmonary disease. Electronically Signed   By: Virgina Norfolk M.D.   On: 02/08/2021 23:48    ____________________________________________   PROCEDURES  Procedure(s) performed (including Critical Care):  Procedures   ____________________________________________   INITIAL IMPRESSION / ASSESSMENT AND PLAN / ED COURSE  As part of my medical decision making, I reviewed the following data within the [place]         Patient presents with dyspnea secondary to bronchospasm and cough.  Discussed no acute findings on chest x-ray with patient.  Discussed no acute findings on lab results.  Patient complaint physical exam consistent with cough due to bronchospasm.  Patient given discharge care instruction advised take medication as directed.  ____________________________________________   FINAL CLINICAL IMPRESSION(S) / ED DIAGNOSES  Final diagnoses:  Cough due to bronchospasm     ED Discharge Orders         Ordered    albuterol (VENTOLIN HFA) 108 (90 Base) MCG/ACT inhaler  Every 6 hours PRN        02/09/21 0735    benzonatate (TESSALON PERLES) 100 MG capsule  3 times daily PRN        02/09/21 0735    chlorpheniramine-HYDROcodone (TUSSIONEX PENNKINETIC ER) 10-8 MG/5ML SUER  At bedtime PRN        02/09/21 0735    DIPHENHYDRAMINE HCL, TOPICAL, (BENADRYL ITCH STOPPING) 2 % GEL  3 times daily        02/09/21 0737           Note:  This document was prepared using Dragon voice recognition software and may include unintentional dictation errors.    Sable Feil, PA-C 02/09/21 9201    Lucrezia Starch, MD 02/09/21 [phone]

## 2021-02-09 NOTE — ED Notes (Signed)
See triage note  Presents with cough and ear pain  States she has a hx of same  No fever  Used her inhalers with min relief

## 2021-02-09 NOTE — Discharge Instructions (Signed)
No acute findings on chest x-ray and lab results.  Read and follow discharge care instruction.  Take medication as directed.

## 2021-03-02 ENCOUNTER — Ambulatory Visit: Payer: Medicare Other | Admitting: Family Medicine

## 2021-03-25 ENCOUNTER — Encounter: Payer: Medicare Other | Admitting: Unknown Physician Specialty

## 2021-04-05 ENCOUNTER — Other Ambulatory Visit: Payer: Self-pay

## 2021-04-05 ENCOUNTER — Telehealth (INDEPENDENT_AMBULATORY_CARE_PROVIDER_SITE_OTHER): Payer: Medicare Other | Admitting: Family Medicine

## 2021-04-05 ENCOUNTER — Encounter: Payer: Self-pay | Admitting: Family Medicine

## 2021-04-05 DIAGNOSIS — R059 Cough, unspecified: Secondary | ICD-10-CM | POA: Diagnosis not present

## 2021-04-05 MED ORDER — BENZONATATE 100 MG PO CAPS: 200.0000 mg | ORAL_CAPSULE | Freq: Three times a day (TID) | ORAL | 0 refills | Status: DC | PRN

## 2021-04-05 MED ORDER — ATORVASTATIN CALCIUM 10 MG PO TABS: 10.0000 mg | ORAL_TABLET | ORAL | 0 refills | Status: DC

## 2021-04-05 MED ORDER — PREDNISONE 20 MG PO TABS: 40.0000 mg | ORAL_TABLET | Freq: Every day | ORAL | 0 refills | Status: AC

## 2021-04-05 NOTE — Patient Instructions (Signed)
It was great to see you!  Our plans for today:  - Take your Breo and pantoprazole daily as prescribed. - Take the tessalon perles as needed for cough. - Take the steroids as prescribed. - If you are still having trouble after finishing the steroids, come back for an in-person appointment.   Take care and seek immediate care sooner if you develop any concerns.   Dr. Ky Barban

## 2021-04-05 NOTE — Progress Notes (Signed)
Virtual Visit Note  I connected with Sonya Harper on 04/05/21 at  3:40 PM EDT by phone and verified that I am speaking with the correct person using two identifiers.  Location: Patient: home Provider: [facility]   I discussed the limitations of evaluation and management by telemedicine and the availability of in person appointments. The patient expressed understanding and agreed to proceed.  History of Present Illness:  COUGH - seen previously 6/7 for same, thought to be 2/2 bronchospasm. CXR neagtive. Gave albuterol, tessalon perles, tussionex - h/o asthma, on breo. Taking daily starting a couple weeks ago, previously was as needed - previously on antihypertensive d/ced d/t cough.  Duration:  chronic, years. Worse last week. Circumstances of initial development of cough: unknown Cough description:  phlegm Aggravating factors:   worse after eating, laying down Alleviating factors: nothing Status:  worse Treatments attempted: cough medicine, albuterol Wheezing: no Shortness of breath:  occasional Chest pain: no Chest tightness: no change Nasal congestion:  occasional Runny nose: no Frequent throat clearing or swallowing: no Hemoptysis: no Fevers: no Weight loss: no Heartburn:  occasionally Recent foreign travel: no Sick contacts: no, husband with COVID recently, hospitalized but hasn't been around.     Observations/Objective:  Patient had trouble connecting to video visit, entirety of visit conducted over the phone.  Speaks in full sentences, no respiratory distress.   Assessment and Plan:  Cough Unclear etiology though likely multifactorial. H/o asthma but has not been compliant with daily dosing of maintenance inhaler before a few weeks ago, ED visit 6/7 thought to be 2/2 bronchospasm, CXR at that time and lung exam clear. No fever, pain, productive cough so doubt PNA. Also with h/o GERD, on PPI as needed though has been taking more recently. With report of cough worse  with laying down and after eating, could also be contributing. Recommend daily PPI dosing. Will also provide steroid burst for bronchospasm and encouraged daily breo use. F/u in person if no better after finishing steroids.     I discussed the assessment and treatment plan with the patient. The patient was provided an opportunity to ask questions and all were answered. The patient agreed with the plan and demonstrated an understanding of the instructions.   The patient was advised to call back or seek an in-person evaluation if the symptoms worsen or if the condition fails to improve as anticipated.  I provided 15 minutes of non-face-to-face time during this encounter.   Myles Gip, DO

## 2021-04-09 ENCOUNTER — Telehealth: Payer: Medicare Other | Admitting: Family Medicine

## 2021-04-13 ENCOUNTER — Other Ambulatory Visit: Payer: Self-pay

## 2021-04-13 ENCOUNTER — Ambulatory Visit (INDEPENDENT_AMBULATORY_CARE_PROVIDER_SITE_OTHER): Payer: Medicare Other | Admitting: Family Medicine

## 2021-04-13 ENCOUNTER — Encounter: Payer: Self-pay | Admitting: Family Medicine

## 2021-04-13 VITALS — BP 126/80 | HR 86 | Temp 98.2°F | Resp 16 | Ht 65.0 in | Wt 184.7 lb

## 2021-04-13 DIAGNOSIS — R1110 Vomiting, unspecified: Secondary | ICD-10-CM | POA: Diagnosis not present

## 2021-04-13 MED ORDER — PANTOPRAZOLE SODIUM 40 MG PO TBEC: 40.0000 mg | DELAYED_RELEASE_TABLET | Freq: Two times a day (BID) | ORAL | 1 refills | Status: DC

## 2021-04-13 NOTE — Progress Notes (Signed)
    SUBJECTIVE:   CHIEF COMPLAINT / HPI:   COUGH - seen previously 6/7 for same, thought to be 2/2 bronchospasm. CXR negative. Gave albuterol, tessalon perles, tussionex - seen virtually for same 8/1, thought ?2/2 asthma given had not been using maintenance inhaler, also maybe GERD contributing given worse with laying down. Gave steroid burst and recommended daily PPI use.  - previously on antihypertensive d/ced d/t cough. - reports symptoms worse over the weekend but is now better, still coughing.  - finished steroids, taking PPI and breo daily. Doesn't feel tessalon is helping. - did have some regurgitation of food a few days ago and some loss of appetite.  Duration:  years, worse in the last few weeks Circumstances of initial development of cough: unknown Cough description: productive of phlegm Aggravating factors:   laying down, worse after eating, did vomit after eating a few days ago. Alleviating factors:  nothing Status:  better Treatments attempted: PPI, breo, tessalon Wheezing: no Shortness of breath:  a little more than usual Chest pain: no Chest tightness:no Nasal congestion: no Runny nose: no Frequent throat clearing or swallowing: yes Hemoptysis: no Fevers: yes Weight loss: no Heartburn: no   OBJECTIVE:   BP 126/80   Pulse 86   Temp 98.2 F (36.8 C) (Oral)   Resp 16   Ht '5\' 5"'$  (1.651 m)   Wt 184 lb 11.2 oz (83.8 kg)   SpO2 97%   BMI 30.74 kg/m   Gen: well appearing, elderly, in NAD Card: irregular rhythm, reg rate Lungs: CTAB. No crackles, wheeze Ext: WWP, no edema   ASSESSMENT/PLAN:   Cough Likely 2/2 GERD given normal lung exam, no improvement with steroid burst or tessalon and slightly improved since daily dosing of PPI. Also with regurgitation of food and loss of appetite. Recommend BID PPI dosing. Will refer to GI for further evaluation and potential endoscopy.    Myles Gip, DO

## 2021-04-13 NOTE — Patient Instructions (Signed)
It was great to see you!  Our plans for today:  - Take the pantoprazole twice daily. - Let us know if you don't hear about an appointment with the GI doctor in the next week.   Take care and seek immediate care sooner if you develop any concerns.   Dr. Ky Barban

## 2021-04-14 ENCOUNTER — Other Ambulatory Visit: Payer: Self-pay

## 2021-04-14 NOTE — Telephone Encounter (Signed)
Copied from [place] [phone]. Topic: Quick Communication - Rx Refill/Question >> Apr 14, 2021  4:29 PM Tessa Lerner A wrote: Medication: benzonatate (TESSALON PERLES) 100 MG capsule   Has the patient contacted their pharmacy? Yes.   (Agent: If no, request that the patient contact the pharmacy for the refill.) (Agent: If yes, when and what did the pharmacy advise?)  Preferred Pharmacy (with phone number or street name): [facility] [id number] [address], [place]  Phone:  [phone] Fax:  [phone]  Agent: Please be advised that RX refills may take up to 3 business days. We ask that you follow-up with your pharmacy.

## 2021-04-15 MED ORDER — BENZONATATE 100 MG PO CAPS: 200.0000 mg | ORAL_CAPSULE | Freq: Three times a day (TID) | ORAL | 0 refills | Status: DC | PRN

## 2021-04-15 NOTE — Telephone Encounter (Signed)
  Notes to clinic:  Patient was given short supply Review for continued use and refill    Requested Prescriptions  Pending Prescriptions Disp Refills   benzonatate (TESSALON PERLES) 100 MG capsule 30 capsule 0    Sig: Take 2 capsules (200 mg total) by mouth 3 (three) times daily as needed.     Ear, Nose, and Throat:  Antitussives/Expectorants Passed - 04/14/2021  4:35 PM      Passed - Valid encounter within last 12 months    Recent Outpatient Visits           2 days ago Regurgitation of food   [place], DO   1 week ago Cough   [place], DO   8 months ago Cough   [facility] Towanda Malkin, MD   9 months ago Cough   [facility] Towanda Malkin, MD   11 months ago Cough   [facility] Towanda Malkin, MD

## 2021-04-26 ENCOUNTER — Telehealth: Payer: Self-pay

## 2021-04-26 NOTE — Telephone Encounter (Signed)
Pt states she is still having a cough and it is keeping her up at night. She is requesting cough medicine and abx.  Pt states she cannot go on like this.  She has Afib and this cough not good for that either. Pt would like to be agressive in getting over this cough.  [facility] [id number] - [address], [place]

## 2021-04-26 NOTE — Telephone Encounter (Signed)
Pt.notified

## 2021-04-27 ENCOUNTER — Telehealth: Payer: Self-pay | Admitting: Family Medicine

## 2021-04-27 NOTE — Telephone Encounter (Signed)
Pt notified, will give office a call

## 2021-04-27 NOTE — Telephone Encounter (Signed)
Checked with referral coordinator who stated GI office has tried reaching out to patient to schedule appointment. She should call their office at [phone] to schedule.

## 2021-06-09 ENCOUNTER — Ambulatory Visit: Payer: 59 | Admitting: Gastroenterology

## 2021-06-30 ENCOUNTER — Other Ambulatory Visit: Payer: Self-pay | Admitting: Family Medicine

## 2021-06-30 NOTE — Telephone Encounter (Signed)
Requested medications are due for refill today.  yes  Requested medications are on the active medications list.  yes  Last refill. 04/05/2021  Future visit scheduled.   no  Notes to clinic.  Labs are expired.  Dr. Roxan Hockey is PCP.

## 2021-10-05 ENCOUNTER — Other Ambulatory Visit: Payer: Self-pay

## 2021-10-05 MED ORDER — EPINEPHRINE 0.3 MG/0.3ML IJ SOAJ: INTRAMUSCULAR | 1 refills | Status: DC

## 2021-10-05 NOTE — Telephone Encounter (Unsigned)
Copied from [place] [phone]. Topic: Quick Communication - Rx Refill/Question >> Oct 05, 2021  9:26 AM Tessa Lerner A wrote: Medication: EPINEPHrine (EPIPEN 2-PAK) 0.3 mg/0.3 mL IJ SOAJ injection [[id number]]   Has the patient contacted their pharmacy? Yes. The patient has been directed to contact their PCP. (Agent: If no, request that the patient contact the pharmacy for the refill. If patient does not wish to contact the pharmacy document the reason why and proceed with request.) (Agent: If yes, when and what did the pharmacy advise?)  Preferred Pharmacy (with phone number or street name): [facility] [id number], [place] [address] Phone: [phone] Fax: [phone] Hours: Not open 24 hours   Has the patient been seen for an appointment in the last year OR does the patient have an upcoming appointment? Yes.    Agent: Please be advised that RX refills may take up to 3 business days. We ask that you follow-up with your pharmacy.

## 2021-10-05 NOTE — Telephone Encounter (Signed)
Requested medication (s) are due for refill today: yes  Requested medication (s) are on the active medication list: yes    Last refill: 05/16/18    2   1  refill  Future visit scheduled yes  10/19/21  Notes to clinic:Historical provider, please review.Thank you  Requested Prescriptions  Pending Prescriptions Disp Refills   EPINEPHrine (EPIPEN 2-PAK) 0.3 mg/0.3 mL IJ SOAJ injection 2 each 1    Sig: Use per package directions; one injection subcutaneously for LIFE-THREATENING emergency, deadly reaction     Immunology: Antidotes Passed - 10/05/2021  9:29 AM      Passed - Valid encounter within last 12 months    Recent Outpatient Visits           5 months ago Regurgitation of food   [place], DO   6 months ago Cough   [facility] Rory Percy M, DO   1 year ago Cough   [facility] Towanda Malkin, MD   1 year ago Cough   [facility] Towanda Malkin, MD   1 year ago Cough   [facility] Towanda Malkin, MD       Future Appointments             In 2 weeks Teodora Medici, DO [facility], [place]   In 2 months Ralene Bathe, MD [place]

## 2021-10-06 ENCOUNTER — Other Ambulatory Visit: Payer: Self-pay | Admitting: Family Medicine

## 2021-10-06 NOTE — Telephone Encounter (Signed)
Requested medication (s) are due for refill today: yes  Requested medication (s) are on the active medication list: yes  Last refill:  07/08/21  Future visit scheduled: 10/19/21  Notes to clinic:  PCP listed no longer in practice, please assess.  Requested Prescriptions  Pending Prescriptions Disp Refills   pantoprazole (PROTONIX) 40 MG tablet [Pharmacy Med Name: PANTOPRAZOLE SOD DR 40 MG TAB] 180 tablet 0    Sig: TAKE ONE TABLET BY MOUTH TWICE A DAY     Gastroenterology: Proton Pump Inhibitors Passed - 10/06/2021  6:21 AM      Passed - Valid encounter within last 12 months    Recent Outpatient Visits           5 months ago Regurgitation of food   [place], DO   6 months ago Cough   [facility] Rory Percy M, DO   1 year ago Cough   [facility] Towanda Malkin, MD   1 year ago Cough   [facility] Towanda Malkin, MD   1 year ago Cough   [place], [place], MD       Future Appointments             In 1 week Teodora Medici, DO [facility], [place]   In 2 months Ralene Bathe, MD [place]

## 2021-10-19 ENCOUNTER — Encounter: Payer: Self-pay | Admitting: Internal Medicine

## 2021-10-19 ENCOUNTER — Other Ambulatory Visit: Payer: Self-pay

## 2021-10-19 ENCOUNTER — Ambulatory Visit (INDEPENDENT_AMBULATORY_CARE_PROVIDER_SITE_OTHER): Payer: 59 | Admitting: Internal Medicine

## 2021-10-19 VITALS — BP 160/82 | HR 96 | Temp 98.1°F | Resp 16 | Ht 65.0 in | Wt 180.9 lb

## 2021-10-19 DIAGNOSIS — Z1231 Encounter for screening mammogram for malignant neoplasm of breast: Secondary | ICD-10-CM

## 2021-10-19 DIAGNOSIS — E2839 Other primary ovarian failure: Secondary | ICD-10-CM

## 2021-10-19 DIAGNOSIS — Z1159 Encounter for screening for other viral diseases: Secondary | ICD-10-CM

## 2021-10-19 DIAGNOSIS — Z13820 Encounter for screening for osteoporosis: Secondary | ICD-10-CM

## 2021-10-19 DIAGNOSIS — Z0001 Encounter for general adult medical examination with abnormal findings: Secondary | ICD-10-CM

## 2021-10-19 NOTE — Progress Notes (Signed)
Name: Sonya Harper   MRN: [id number]    DOB: 02-03-1946   Date:10/19/2021       Progress Note  Subjective  Chief Complaint  Chief Complaint  Patient presents with   Annual Exam    HPI  Patient presents for annual CPE.  Diet: chicken, rice, zucchini, hamburgers sometimes, ham sometimes. Overall feels well balanced.  Exercise: no regular routine, husband recently passed away and routine has been difficult  [place] Office Visit from 04/13/2021 in [facility]  AUDIT-C Score 0      Depression: Phq 9 is  negative Depression screen [facility] [address] 10/19/2021 04/13/2021 04/05/2021 08/10/2020 07/08/2020  Decreased Interest 1 0 0 0 0  Down, Depressed, Hopeless 0 0 0 0 1  PHQ - 2 Score 1 0 0 0 1  Altered sleeping 0 - - - 3  Tired, decreased energy 0 - - - 3  Change in appetite 0 - - - 0  Feeling bad or failure about yourself  0 - - - 0  Trouble concentrating 0 - - - 0  Moving slowly or fidgety/restless 0 - - - 0  Suicidal thoughts 0 - - - 0  PHQ-9 Score 1 - - - 7  Difficult doing work/chores Not difficult at all - - - Not difficult at all  Some recent data might be hidden   Hypertension: BP Readings from Last 3 Encounters:  10/19/21 (!) 152/70  04/13/21 126/80  02/09/21 (!) 165/97   Obesity: Wt Readings from Last 3 Encounters:  10/19/21 180 lb 14.4 oz (82.1 kg)  04/13/21 184 lb 11.2 oz (83.8 kg)  02/08/21 183 lb (83 kg)   BMI Readings from Last 3 Encounters:  10/19/21 30.10 kg/m  04/13/21 30.74 kg/m  02/08/21 30.45 kg/m     Vaccines:    Tdap: due, discussed that she will talk to her pharmacist about it due to insurance  Shingrix: yes Pneumonia: 76 and 24 Flu: yes COVID-19: first 2, due for booster  Hep C Screening: due today STD testing and prevention (HIV/chl/gon/syphilis): no concerns Intimate partner violence: negative Menstrual History/LMP/Abnormal Bleeding: no vaginal bleeding Discussed importance of follow up if any post-menopausal  bleeding: yes Incontinence Symptoms: No.  Breast cancer:  - Last Mammogram: 10/20, ordered today - BRCA gene screening: no  Osteoporosis Prevention : Discussed high calcium and vitamin D supplementation, weight bearing exercises Bone density: due today  Cervical cancer screening: hysterectomy   Skin cancer: Discussed monitoring for atypical lesions  Colorectal cancer: colonoscopy 1/21, repeat in 5 years   Lung cancer:  Low Dose CT Chest recommended if Age 75-80 years, 20 pack-year currently smoking OR have quit w/in 15years. Patient does not qualify.   ECG: 6/22  Advanced Care Planning: A voluntary discussion about advance care planning including the explanation and discussion of advance directives.  Discussed health care proxy and Living will, and the patient was able to identify a health care proxy as daughters.  Patient does not   Lipids: Lab Results  Component Value Date   CHOL 149 12/20/2019   CHOL 134 08/30/2018   CHOL 148 02/26/2018   Lab Results  Component Value Date   HDL 51 12/20/2019   HDL 49 (L) 08/30/2018   HDL 52 02/26/2018   Lab Results  Component Value Date   LDLCALC 84 12/20/2019   LDLCALC 72 08/30/2018   LDLCALC 80 02/26/2018   Lab Results  Component Value Date   TRIG 63 12/20/2019   TRIG 54  08/30/2018   TRIG 76 02/26/2018   Lab Results  Component Value Date   CHOLHDL 2.9 12/20/2019   CHOLHDL 2.7 08/30/2018   CHOLHDL 2.8 02/26/2018   No results found for: LDLDIRECT  Glucose: Glucose  Date Value Ref Range Status  06/03/2012 117 (H) 65 - 99 mg/dL Final   Glucose, Bld  Date Value Ref Range Status  02/08/2021 118 (H) 70 - 99 mg/dL Final    Comment:    Glucose reference range applies only to samples taken after fasting for at least 8 hours.  10/30/2020 129 (H) 70 - 99 mg/dL Final    Comment:    Glucose reference range applies only to samples taken after fasting for at least 8 hours.  06/29/2020 117 (H) 70 - 99 mg/dL Final    Comment:     Glucose reference range applies only to samples taken after fasting for at least 8 hours.    Patient Active Problem List   Diagnosis Date Noted   Persistent asthma with acute exacerbation 04/24/2020   Polyp of transverse colon    Polyp of ascending colon    Chronic anticoagulation 09/12/2019   Paroxysmal atrial fibrillation ([place]) 09/12/2019   AKI (acute kidney injury) ([place]) 06/26/2019   Grade I diastolic dysfunction 54/65/0354   Mild mitral regurgitation 06/26/2019   Mild tricuspid regurgitation 06/26/2019   Lumbar facet arthropathy 11/06/2018   Prediabetes 02/27/2018   Medication monitoring encounter 11/13/2017   Abnormal laboratory test 10/16/2017   Palpitations 08/23/2017   Breast pain in female 08/10/2017   Obesity (BMI 30.0-34.9) 08/10/2017   Protein in urine 08/10/2017   Vaginal atrophy 08/10/2017   Benign essential HTN 11/16/2016   Hyperlipidemia, mixed 11/16/2016   SOBOE (shortness of breath on exertion) 11/16/2016   Stable angina pectoris ([place]) 11/16/2016   Dysrhythmia 07/07/2016   Right low back pain 07/07/2016   Fall on or from stairs or steps 07/07/2016   Family history of malignant neoplasm of gastrointestinal tract    Benign neoplasm of ascending colon    Benign neoplasm of transverse colon    Benign neoplasm of descending colon    Goiter 03/03/2015   Asthma, mild persistent 03/03/2015   Gastroesophageal reflux disease without esophagitis 03/03/2015    Past Surgical History:  Procedure Laterality Date   ABDOMINAL HYSTERECTOMY     COLONOSCOPY WITH PROPOFOL N/A 05/13/2016   Procedure: COLONOSCOPY WITH PROPOFOL;  Surgeon: Lucilla Lame, MD;  Location: [place];  Service: Endoscopy;  Laterality: N/A;   COLONOSCOPY WITH PROPOFOL N/A 09/20/2019   Procedure: COLONOSCOPY WITH PROPOFOL;  Surgeon: Lucilla Lame, MD;  Location[facility] ENDOSCOPY;  Service: Endoscopy;  Laterality: N/A;   ESOPHAGOGASTRODUODENOSCOPY (EGD) WITH PROPOFOL N/A 09/20/2019   Procedure:  ESOPHAGOGASTRODUODENOSCOPY (EGD) WITH PROPOFOL;  Surgeon: Lucilla Lame, MD;  Location: [facility] ENDOSCOPY;  Service: Endoscopy;  Laterality: N/A;   POLYPECTOMY N/A 05/13/2016   Procedure: POLYPECTOMY;  Surgeon: Lucilla Lame, MD;  Location: [place];  Service: Endoscopy;  Laterality: N/A;    Family History  Problem Relation Age of Onset   Stroke Mother    Breast cancer Sister 81       breast ca x3   Colon cancer Paternal Aunt    Cancer Sister     Social History   Socioeconomic History   Marital status: Married    Spouse name: Not on file   Number of children: Not on file   Years of education: Not on file   Highest education level: Not on file  Occupational History   Not on file  Tobacco Use   Smoking status: Never   Smokeless tobacco: Never  Vaping Use   Vaping Use: Never used  Substance and Sexual Activity   Alcohol use: No    Alcohol/week: 0.0 standard drinks   Drug use: Never   Sexual activity: Not Currently    Birth control/protection: Post-menopausal  Other Topics Concern   Not on file  Social History Narrative   Not on file   Social Determinants of Health   Financial Resource Strain: Not on file  Food Insecurity: Not on file  Transportation Needs: Not on file  Physical Activity: Not on file  Stress: Not on file  Social Connections: Not on file  Intimate Partner Violence: Not on file     Current Outpatient Medications:    albuterol (VENTOLIN HFA) 108 (90 Base) MCG/ACT inhaler, INHALE 2 PUFFS EVERY 6 HOURS AS NEEDED FOR SHORTNESS OF BREATH, Disp: 6.7 g, Rfl: 1   albuterol (VENTOLIN HFA) 108 (90 Base) MCG/ACT inhaler, Inhale 2 puffs into the lungs every 6 (six) hours as needed for wheezing or shortness of breath., Disp: 1 each, Rfl: 2   amLODipine (NORVASC) 5 MG tablet, Take by mouth., Disp: , Rfl:    apixaban (ELIQUIS) 5 MG TABS tablet, Take by mouth., Disp: , Rfl:    atorvastatin (LIPITOR) 10 MG tablet, TAKE ONE TABLET BY MOUTH EVERY OTHER DAY, Disp:  45 tablet, Rfl: 3   benzonatate (TESSALON PERLES) 100 MG capsule, Take 2 capsules (200 mg total) by mouth 3 (three) times daily as needed., Disp: 30 capsule, Rfl: 0   chlorpheniramine-HYDROcodone (TUSSIONEX PENNKINETIC ER) 10-8 MG/5ML SUER, Take 5 mLs by mouth at bedtime as needed for cough., Disp: 70 mL, Rfl: 0   Cholecalciferol (VITAMIN D-1000 MAX ST) 25 MCG (1000 UT) tablet, Take by mouth., Disp: , Rfl:    DIPHENHYDRAMINE HCL, TOPICAL, (BENADRYL ITCH STOPPING) 2 % GEL, Apply 1 application topically in the morning, at noon, and at bedtime., Disp: 118 mL, Rfl: 1   EPINEPHrine (EPIPEN 2-PAK) 0.3 mg/0.3 mL IJ SOAJ injection, Use per package directions; one injection subcutaneously for LIFE-THREATENING emergency, deadly reaction, Disp: 2 each, Rfl: 1   fluticasone (FLONASE) 50 MCG/ACT nasal spray, Place 2 sprays into both nostrils daily., Disp: 16 g, Rfl: 6   fluticasone furoate-vilanterol (BREO ELLIPTA) 100-25 MCG/INH AEPB, INHALE ONE DOSE BY MOUTH DAILY, Disp: 60 each, Rfl: 11   levothyroxine (SYNTHROID) 50 MCG tablet, Take by mouth., Disp: , Rfl:    nitroGLYCERIN (NITROSTAT) 0.4 MG SL tablet, Place 1 tablet (0.4 mg total) under the tongue every 5 (five) minutes as needed for chest pain. Max 3 pills per episode, Disp: 50 tablet, Rfl: 3   ondansetron (ZOFRAN ODT) 4 MG disintegrating tablet, Take 1 tablet (4 mg total) by mouth every 8 (eight) hours as needed for nausea or vomiting., Disp: 12 tablet, Rfl: 0   pantoprazole (PROTONIX) 40 MG tablet, TAKE ONE TABLET BY MOUTH TWICE A DAY, Disp: 180 tablet, Rfl: 0   SYNTHROID 50 MCG tablet, TAKE 1 BY MOUTH DAILY. DO NOT SUBSTITUTE, Disp: , Rfl: 0   losartan (COZAAR) 25 MG tablet, Take 25 mg by mouth daily., Disp: , Rfl:   Allergies  Allergen Reactions   Molds & Smuts Anaphylaxis   Penicillins Nausea And Vomiting and Rash    Has patient had a PCN reaction causing immediate rash, facial/tongue/throat swelling, SOB or lightheadedness with hypotension:  Yes Has patient had a PCN reaction causing  severe rash involving mucus membranes or skin necrosis: No Has patient had a PCN reaction that required hospitalization: No Has patient had a PCN reaction occurring within the last 10 years: No If all of the above answers are "NO", then may proceed with Cephalosporin use.      Review of Systems  Constitutional: Negative.   Respiratory: Negative.    Cardiovascular: Negative.   Gastrointestinal: Negative.   Genitourinary: Negative.      Objective  Vitals:   10/19/21 1428  Pulse: 96  Resp: 16  Temp: 98.1 F (36.7 C)  SpO2: 98%  Weight: 180 lb 14.4 oz (82.1 kg)  Height: $Remove'5\' 5"'BlIEqzl$  (1.651 m)    Body mass index is 30.1 kg/m.  Physical Exam Constitutional:      Appearance: Normal appearance.  HENT:     Head: Normocephalic and atraumatic.  Eyes:     Conjunctiva/sclera: Conjunctivae normal.  Cardiovascular:     Rate and Rhythm: Normal rate and regular rhythm.  Pulmonary:     Effort: Pulmonary effort is normal.     Breath sounds: Normal breath sounds.  Musculoskeletal:     Right lower leg: No edema.     Left lower leg: No edema.  Skin:    General: Skin is warm and dry.  Neurological:     General: No focal deficit present.     Mental Status: She is alert. Mental status is at baseline.  Psychiatric:        Mood and Affect: Mood normal.        Behavior: Behavior normal.     No results found for this or any previous visit (from the past 2160 hour(s)).   Fall Risk: Fall Risk  10/19/2021 04/13/2021 04/05/2021 08/10/2020 07/08/2020  Falls in the past year? 0 0 0 1 1  Number falls in past yr: 0 0 0 0 0  Injury with Fall? 0 0 0 0 0  Risk Factor Category  - - - - -  Risk for fall due to : - - - - -  Follow up - Falls evaluation completed Falls evaluation completed - -     Functional Status Survey: Is the patient deaf or have difficulty hearing?: No Does the patient have difficulty seeing, even when wearing glasses/contacts?:  Yes Does the patient have difficulty concentrating, remembering, or making decisions?: No Does the patient have difficulty walking or climbing stairs?: No Does the patient have difficulty dressing or bathing?: No Does the patient have difficulty doing errands alone such as visiting a doctor's office or shopping?: No   Assessment & Plan  1. Encounter for general adult medical examination with abnormal findings: Due for lipid panel, CMP.   - COMPLETE METABOLIC PANEL WITH GFR - Lipid Profile  2. Encounter for hepatitis C screening test for low risk patient: Hepatitis C screening due as well.   - Hepatitis C Antibody  3. Encounter for screening mammogram for malignant neoplasm of breast./Screening for osteoporosis/Estrogen deficiency: Mammogram and bone scan ordered.  - MM 3D SCREEN BREAST BILATERAL; Future  - DG Bone Density; Future  -USPSTF grade A and B recommendations reviewed with patient; age-appropriate recommendations, preventive care, screening tests, etc discussed and encouraged; healthy living encouraged; see AVS for patient education given to patient -Discussed importance of 150 minutes of physical activity weekly, eat two servings of fish weekly, eat one serving of tree nuts ( cashews, pistachios, pecans, almonds.Marland Kitchen) every other day, eat 6 servings of fruit/vegetables daily and drink plenty of water and  avoid sweet beverages.   -Reviewed Health Maintenance: Yes.

## 2021-10-19 NOTE — Patient Instructions (Addendum)
It was great seeing you today!  Plan discussed at today's visit: -Blood work ordered today, results will be uploaded to [facility].  -Mammogram and bone scan ordered today -Consider new pneumonia vaccine, more info below  Follow up in: 1 month   Take care and let us know if you have any questions or concerns prior to your next visit.  Dr. Rosana Berger  Pneumococcal Conjugate Vaccine (Prevnar 20) Suspension for Injection What is this medication? PNEUMOCOCCAL VACCINE (NEU mo KOK al vak SEEN) is a vaccine. It prevents pneumococcus bacterial infections. These bacteria can cause serious infections like pneumonia, meningitis, and blood infections. This vaccine will not treat an infection and will not cause infection. This vaccine is recommended for adults 18 years and older. This medicine may be used for other purposes; ask your health care provider or pharmacist if you have questions. COMMON BRAND NAME(S): Prevnar 20 What should I tell my care team before I take this medication? They need to know if you have any of these conditions: bleeding disorder fever immune system problems an unusual or allergic reaction to pneumococcal vaccine, diphtheria toxoid, other vaccines, other medicines, foods, dyes, or preservatives pregnant or trying to get pregnant breast-feeding How should I use this medication? This vaccine is injected into a muscle. It is given by a health care provider. A copy of Vaccine Information Statements will be given before each vaccination. Be sure to read this information carefully each time. This sheet may change often. Talk to your health care provider about the use of this medicine in children. Special care may be needed. Overdosage: If you think you have taken too much of this medicine contact a poison control center or emergency room at once. NOTE: This medicine is only for you. Do not share this medicine with others. What if I miss a dose? This does not apply. This medicine is  not for regular use. What may interact with this medication? medicines for cancer chemotherapy medicines that suppress your immune function steroid medicines like prednisone or cortisone This list may not describe all possible interactions. Give your health care provider a list of all the medicines, herbs, non-prescription drugs, or dietary supplements you use. Also tell them if you smoke, drink alcohol, or use illegal drugs. Some items may interact with your medicine. What should I watch for while using this medication? Mild fever and pain should go away in 3 days or less. Report any unusual symptoms to your health care provider. What side effects may I notice from receiving this medication? Side effects that you should report to your doctor or health care professional as soon as possible: allergic reactions (skin rash, itching or hives; swelling of the face, lips, or tongue) confusion fast, irregular heartbeat fever over 102 degrees F muscle weakness seizures trouble breathing unusual bruising or bleeding Side effects that usually do not require medical attention (report to your doctor or health care professional if they continue or are bothersome): fever of 102 degrees F or less headache joint pain muscle cramps, pain pain, tender at site where injected This list may not describe all possible side effects. Call your doctor for medical advice about side effects. You may report side effects to FDA at 1-800-FDA-1088. Where should I keep my medication? This vaccine is only given by a health care provider. It will not be stored at home. NOTE: This sheet is a summary. It may not cover all possible information. If you have questions about this medicine, talk to your doctor, pharmacist,  or health care provider.  2022 Elsevier/Gold Standard (2020-05-07 00:00:00) Health Maintenance, Female Adopting a healthy lifestyle and getting preventive care are important in promoting health and wellness.  Ask your health care provider about: The right schedule for you to have regular tests and exams. Things you can do on your own to prevent diseases and keep yourself healthy. What should I know about diet, weight, and exercise? Eat a healthy diet  Eat a diet that includes plenty of vegetables, fruits, low-fat dairy products, and lean protein. Do not eat a lot of foods that are high in solid fats, added sugars, or sodium. Maintain a healthy weight Body mass index (BMI) is used to identify weight problems. It estimates body fat based on height and weight. Your health care provider can help determine your BMI and help you achieve or maintain a healthy weight. Get regular exercise Get regular exercise. This is one of the most important things you can do for your health. Most adults should: Exercise for at least 150 minutes each week. The exercise should increase your heart rate and make you sweat (moderate-intensity exercise). Do strengthening exercises at least twice a week. This is in addition to the moderate-intensity exercise. Spend less time sitting. Even light physical activity can be beneficial. Watch cholesterol and blood lipids Have your blood tested for lipids and cholesterol at 75 years of age, then have this test every 5 years. Have your cholesterol levels checked more often if: Your lipid or cholesterol levels are high. You are older than 75 years of age. You are at high risk for heart disease. What should I know about cancer screening? Depending on your health history and family history, you may need to have cancer screening at various ages. This may include screening for: Breast cancer. Cervical cancer. Colorectal cancer. Skin cancer. Lung cancer. What should I know about heart disease, diabetes, and high blood pressure? Blood pressure and heart disease High blood pressure causes heart disease and increases the risk of stroke. This is more likely to develop in people who have  high blood pressure readings or are overweight. Have your blood pressure checked: Every 3-5 years if you are 5-61 years of age. Every year if you are 27 years old or older. Diabetes Have regular diabetes screenings. This checks your fasting blood sugar level. Have the screening done: Once every three years after age 68 if you are at a normal weight and have a low risk for diabetes. More often and at a younger age if you are overweight or have a high risk for diabetes. What should I know about preventing infection? Hepatitis B If you have a higher risk for hepatitis B, you should be screened for this virus. Talk with your health care provider to find out if you are at risk for hepatitis B infection. Hepatitis C Testing is recommended for: Everyone born from [birth year] through 1964. Anyone with known risk factors for hepatitis C. Sexually transmitted infections (STIs) Get screened for STIs, including gonorrhea and chlamydia, if: You are sexually active and are younger than 75 years of age. You are older than 75 years of age and your health care provider tells you that you are at risk for this type of infection. Your sexual activity has changed since you were last screened, and you are at increased risk for chlamydia or gonorrhea. Ask your health care provider if you are at risk. Ask your health care provider about whether you are at high risk for HIV. Your health  care provider may recommend a prescription medicine to help prevent HIV infection. If you choose to take medicine to prevent HIV, you should first get tested for HIV. You should then be tested every 3 months for as long as you are taking the medicine. Pregnancy If you are about to stop having your period (premenopausal) and you may become pregnant, seek counseling before you get pregnant. Take 400 to 800 micrograms (mcg) of folic acid every day if you become pregnant. Ask for birth control (contraception) if you want to prevent  pregnancy. Osteoporosis and menopause Osteoporosis is a disease in which the bones lose minerals and strength with aging. This can result in bone fractures. If you are 29 years old or older, or if you are at risk for osteoporosis and fractures, ask your health care provider if you should: Be screened for bone loss. Take a calcium or vitamin D supplement to lower your risk of fractures. Be given hormone replacement therapy (HRT) to treat symptoms of menopause. Follow these instructions at home: Alcohol use Do not drink alcohol if: Your health care provider tells you not to drink. You are pregnant, may be pregnant, or are planning to become pregnant. If you drink alcohol: Limit how much you have to: 0-1 drink a day. Know how much alcohol is in your drink. In the [place] one drink equals one 12 oz bottle of beer (355 mL), one 5 oz glass of wine (148 mL), or one 1 oz glass of hard liquor (44 mL). Lifestyle Do not use any products that contain nicotine or tobacco. These products include cigarettes, chewing tobacco, and vaping devices, such as e-cigarettes. If you need help quitting, ask your health care provider. Do not use street drugs. Do not share needles. Ask your health care provider for help if you need support or information about quitting drugs. General instructions Schedule regular health, dental, and eye exams. Stay current with your vaccines. Tell your health care provider if: You often feel depressed. You have ever been abused or do not feel safe at home. Summary Adopting a healthy lifestyle and getting preventive care are important in promoting health and wellness. Follow your health care provider's instructions about healthy diet, exercising, and getting tested or screened for diseases. Follow your health care provider's instructions on monitoring your cholesterol and blood pressure. This information is not intended to replace advice given to you by your health care provider.  Make sure you discuss any questions you have with your health care provider. Document Revised: 01/11/2021 Document Reviewed: 01/11/2021 Elsevier Patient Education  [place].

## 2021-10-20 LAB — COMPLETE METABOLIC PANEL WITH GFR
AG Ratio: 1.5 (calc) (ref 1.0–2.5)
ALT: 8 U/L (ref 6–29)
AST: 12 U/L (ref 10–35)
Albumin: 3.7 g/dL (ref 3.6–5.1)
Alkaline phosphatase (APISO): 103 U/L (ref 37–153)
BUN: 15 mg/dL (ref 7–25)
CO2: 28 mmol/L (ref 20–32)
Calcium: 8.9 mg/dL (ref 8.6–10.4)
Chloride: 106 mmol/L (ref 98–110)
Creat: 0.94 mg/dL (ref 0.60–1.00)
Globulin: 2.4 g/dL (calc) (ref 1.9–3.7)
Glucose, Bld: 136 mg/dL — ABNORMAL HIGH (ref 65–99)
Potassium: 3.9 mmol/L (ref 3.5–5.3)
Sodium: 141 mmol/L (ref 135–146)
Total Bilirubin: 0.5 mg/dL (ref 0.2–1.2)
Total Protein: 6.1 g/dL (ref 6.1–8.1)
eGFR: 64 mL/min/{1.73_m2} (ref >=60)

## 2021-10-20 LAB — HEPATITIS C ANTIBODY
Hepatitis C Ab: NONREACTIVE
SIGNAL TO CUT-OFF: 0.05 (ref <1.00)

## 2021-10-20 LAB — LIPID PANEL
Cholesterol: 121 mg/dL (ref <200)
HDL: 52 mg/dL (ref >=50)
LDL Cholesterol (Calc): 56 mg/dL (calc)
Non-HDL Cholesterol (Calc): 69 mg/dL (calc) (ref <130)
Total CHOL/HDL Ratio: 2.3 (calc) (ref <5.0)
Triglycerides: 57 mg/dL (ref <150)

## 2021-10-21 ENCOUNTER — Telehealth: Payer: Self-pay

## 2021-10-21 NOTE — Telephone Encounter (Signed)
Copied from [place] [id number]. Topic: General - Other >> Oct 21, 2021  8:28 AM Sonya Harper wrote: Reason for CRM: Pt would like a call to go over her lab result / please advise

## 2021-10-21 NOTE — Telephone Encounter (Signed)
Tried to call no answer, will try again

## 2021-11-02 ENCOUNTER — Telehealth: Payer: Self-pay | Admitting: Internal Medicine

## 2021-11-02 DIAGNOSIS — J45901 Unspecified asthma with (acute) exacerbation: Secondary | ICD-10-CM

## 2021-11-02 MED ORDER — FLUTICASONE FUROATE-VILANTEROL 100-25 MCG/ACT IN AEPB: 1.0000 | INHALATION_SPRAY | Freq: Every day | RESPIRATORY_TRACT | 11 refills | Status: DC

## 2021-11-02 NOTE — Telephone Encounter (Signed)
Medication Refill - Medication: fluticasone furoate-vilanterol (BREO ELLIPTA) 100-25 MCG/INH AEPB  Has the patient contacted their pharmacy? Yes.   No, more refills.   (Agent: If yes, when and what did the pharmacy advise?)  Preferred Pharmacy (with phone number or street name):  [facility] [id number], [place]  [place] [address]  Phone: [phone] Fax: [phone]  Hours: Not open 24 hours   Has the patient been seen for an appointment in the last year OR does the patient have an upcoming appointment? Yes.    Agent: Please be advised that RX refills may take up to 3 business days. We ask that you follow-up with your pharmacy.

## 2021-11-02 NOTE — Telephone Encounter (Signed)
Unable to reorder refill for [facility], routing to office for refill.

## 2021-11-02 NOTE — Addendum Note (Signed)
Addended by: Teodora Medici on: 11/02/2021 04:21 PM   Modules accepted: Orders

## 2021-11-15 ENCOUNTER — Ambulatory Visit: Payer: Medicare Other | Admitting: Internal Medicine

## 2021-12-01 ENCOUNTER — Other Ambulatory Visit: Payer: Medicare Other

## 2021-12-27 ENCOUNTER — Ambulatory Visit: Payer: 59 | Admitting: Dermatology

## 2021-12-28 ENCOUNTER — Ambulatory Visit (INDEPENDENT_AMBULATORY_CARE_PROVIDER_SITE_OTHER): Payer: Medicare Other | Admitting: Podiatry

## 2021-12-28 DIAGNOSIS — M79674 Pain in right toe(s): Secondary | ICD-10-CM | POA: Diagnosis not present

## 2021-12-28 DIAGNOSIS — B351 Tinea unguium: Secondary | ICD-10-CM | POA: Diagnosis not present

## 2021-12-28 DIAGNOSIS — M79675 Pain in left toe(s): Secondary | ICD-10-CM

## 2021-12-28 MED ORDER — CICLOPIROX 8 % EX SOLN: Freq: Every day | CUTANEOUS | 0 refills | Status: DC

## 2022-01-04 ENCOUNTER — Encounter: Payer: Self-pay | Admitting: Podiatry

## 2022-01-04 NOTE — Progress Notes (Signed)
?  Subjective:  ?Patient ID: Sonya Harper, female    DOB: [birth year],  MRN: [id number] ? ?Chief Complaint  ?Patient presents with  ? Nail Problem  ? ?76 y.o. female returns for the above complaint. Patient presents with thickened elongated dystrophic mycotic toenails x10.  The painful to touch.  Patient would like for me to debride down she is not able to do it herself.  She denies any other acute complaints. ? ?Objective:  ?There were no vitals filed for this visit. ?Podiatric Exam: ?Vascular: dorsalis pedis and posterior tibial pulses are palpable bilateral. Capillary return is immediate. Temperature gradient is WNL. Skin turgor WNL  ?Sensorium: Normal Semmes Weinstein monofilament test. Normal tactile sensation bilaterally. ?Nail Exam: Pt has thick disfigured discolored nails with subungual debris noted bilateral entire nail hallux through fifth toenails.  Pain on palpation to the nails. ?Ulcer Exam: There is no evidence of ulcer or pre-ulcerative changes or infection. ?Orthopedic Exam: Muscle tone and strength are WNL. No limitations in general ROM. No crepitus or effusions noted.  ?Skin: No Porokeratosis. No infection or ulcers ? ? ? ?Assessment & Plan:  ? ?1. Pain due to onychomycosis of toenails of both feet   ? ? ?Patient was evaluated and treated and all questions answered. ? ?Onychomycosis with pain  ?-Nails palliatively debrided as below. ?-Educated on self-care ? ?Procedure: Nail Debridement ?Rationale: pain  ?Type of Debridement: manual, sharp debridement. ?Instrumentation: Nail nipper, rotary burr. ?Number of Nails: 10 ? ?Procedures and Treatment: Consent by patient was obtained for treatment procedures. The patient understood the discussion of treatment and procedures well. All questions were answered thoroughly reviewed. Debridement of mycotic and hypertrophic toenails, 1 through 5 bilateral and clearing of subungual debris. No ulceration, no infection noted.  ?Return Visit-Office Procedure:  Patient instructed to return to the office for a follow up visit 3 months for continued evaluation and treatment. ? ?Boneta Lucks, DPM ?  ? ?No follow-ups on file. ? ?

## 2022-01-20 ENCOUNTER — Ambulatory Visit: Payer: Medicare Other | Admitting: Internal Medicine

## 2022-01-24 ENCOUNTER — Encounter: Payer: Self-pay | Admitting: Ophthalmology

## 2022-01-25 ENCOUNTER — Ambulatory Visit (INDEPENDENT_AMBULATORY_CARE_PROVIDER_SITE_OTHER): Payer: Medicare Other | Admitting: Internal Medicine

## 2022-01-25 ENCOUNTER — Encounter: Payer: Self-pay | Admitting: Internal Medicine

## 2022-01-25 VITALS — BP 140/74 | HR 97 | Temp 98.4°F | Resp 16 | Ht 65.0 in | Wt 184.1 lb

## 2022-01-25 DIAGNOSIS — F4321 Adjustment disorder with depressed mood: Secondary | ICD-10-CM

## 2022-01-25 DIAGNOSIS — R7303 Prediabetes: Secondary | ICD-10-CM

## 2022-01-25 DIAGNOSIS — I480 Paroxysmal atrial fibrillation: Secondary | ICD-10-CM

## 2022-01-25 DIAGNOSIS — E782 Mixed hyperlipidemia: Secondary | ICD-10-CM | POA: Diagnosis not present

## 2022-01-25 DIAGNOSIS — K219 Gastro-esophageal reflux disease without esophagitis: Secondary | ICD-10-CM

## 2022-01-25 DIAGNOSIS — J4530 Mild persistent asthma, uncomplicated: Secondary | ICD-10-CM

## 2022-01-25 DIAGNOSIS — I10 Essential (primary) hypertension: Secondary | ICD-10-CM | POA: Diagnosis not present

## 2022-01-25 LAB — POCT GLYCOSYLATED HEMOGLOBIN (HGB A1C): Hemoglobin A1C: 6 % — AB (ref 4.0–5.6)

## 2022-01-25 MED ORDER — LOSARTAN POTASSIUM 25 MG PO TABS: 25.0000 mg | ORAL_TABLET | Freq: Every day | ORAL | 0 refills | Status: DC

## 2022-01-25 MED ORDER — AMLODIPINE BESYLATE 5 MG PO TABS: 5.0000 mg | ORAL_TABLET | Freq: Every day | ORAL | 0 refills | Status: DC

## 2022-01-25 MED ORDER — PANTOPRAZOLE SODIUM 40 MG PO TBEC: 40.0000 mg | DELAYED_RELEASE_TABLET | Freq: Every day | ORAL | 1 refills | Status: DC

## 2022-01-25 MED ORDER — FLUTICASONE FUROATE-VILANTEROL 200-25 MCG/ACT IN AEPB: 1.0000 | INHALATION_SPRAY | Freq: Every day | RESPIRATORY_TRACT | 11 refills | Status: DC

## 2022-01-25 NOTE — Assessment & Plan Note (Signed)
Stable, in regular rhythm today.  Follows with cardiology, note reviewed from 10/01/2021.  She is on Eliquis 5 mg twice daily.  She is no longer on anything for rhythm control, as this was discontinued by cardiology.

## 2022-01-25 NOTE — Patient Instructions (Addendum)
It was great seeing you today!  Plan discussed at today's visit: -Blood pressure medications refilled today, continue to check blood pressure at home -Try higher dose of inhaler, continue to use Albuterol as needed -Protonix refilled, take 40 mg once a day -Call number on the card I gave you to schedule mammogram and bone scan  Follow up in: 3 month   Take care and let us know if you have any questions or concerns prior to your next visit.  Dr. Rosana Berger

## 2022-01-25 NOTE — Assessment & Plan Note (Signed)
Stable.  Continue Lipitor 10 mg every other day.

## 2022-01-25 NOTE — Assessment & Plan Note (Signed)
Recheck A1c today 6.0%.  Discussed reducing sugars and carbs in the diet and increasing exercise.

## 2022-01-25 NOTE — Assessment & Plan Note (Signed)
Uncontrolled.  Increase Breo Ellipta to 200-25 mg daily.  Continue to use albuterol as needed.
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
## 2022-01-25 NOTE — Assessment & Plan Note (Addendum)
Currently taking Protonix 40 mg twice daily, states symptoms are not completely controlled.  Reviewed EGD from 1/21, which was positive for gastritis and negative for dysplasia.  Decrease Protonix to 40 mg daily for maintenance, will recheck at follow-up and if symptoms are still present recommend referral to GI for repeat EGD.  Also discussed starting a food diary to see if there are any triggering foods.  She states she does eat acidic, spicy foods regularly as well as carbonated beverages and caffeine.

## 2022-01-27 NOTE — Discharge Instructions (Signed)

## 2022-02-02 ENCOUNTER — Encounter
Admission: RE | Disposition: A | Discharge status: Home / Self Care | Payer: Self-pay | Source: Ambulatory Visit | Attending: Ophthalmology

## 2022-02-02 ENCOUNTER — Other Ambulatory Visit: Payer: Self-pay

## 2022-02-02 ENCOUNTER — Ambulatory Visit: Payer: Medicare Other | Admitting: Anesthesiology

## 2022-02-02 ENCOUNTER — Encounter: Payer: Self-pay | Admitting: Ophthalmology

## 2022-02-02 ENCOUNTER — Ambulatory Visit
Admission: RE | Admit: 2022-02-02 | Discharge: 2022-02-02 | Disposition: A | Discharge status: Home / Self Care | Payer: Medicare Other | Source: Ambulatory Visit | Attending: Ophthalmology | Admitting: Ophthalmology

## 2022-02-02 DIAGNOSIS — K219 Gastro-esophageal reflux disease without esophagitis: Secondary | ICD-10-CM | POA: Insufficient documentation

## 2022-02-02 DIAGNOSIS — M797 Fibromyalgia: Secondary | ICD-10-CM | POA: Diagnosis not present

## 2022-02-02 DIAGNOSIS — E039 Hypothyroidism, unspecified: Secondary | ICD-10-CM | POA: Insufficient documentation

## 2022-02-02 DIAGNOSIS — J45909 Unspecified asthma, uncomplicated: Secondary | ICD-10-CM | POA: Diagnosis not present

## 2022-02-02 DIAGNOSIS — I4891 Unspecified atrial fibrillation: Secondary | ICD-10-CM | POA: Diagnosis not present

## 2022-02-02 DIAGNOSIS — F32A Depression, unspecified: Secondary | ICD-10-CM | POA: Diagnosis not present

## 2022-02-02 DIAGNOSIS — Z79899 Other long term (current) drug therapy: Secondary | ICD-10-CM | POA: Insufficient documentation

## 2022-02-02 DIAGNOSIS — I10 Essential (primary) hypertension: Secondary | ICD-10-CM | POA: Diagnosis not present

## 2022-02-02 DIAGNOSIS — Z7951 Long term (current) use of inhaled steroids: Secondary | ICD-10-CM | POA: Insufficient documentation

## 2022-02-02 DIAGNOSIS — Z7901 Long term (current) use of anticoagulants: Secondary | ICD-10-CM | POA: Diagnosis not present

## 2022-02-02 DIAGNOSIS — H2511 Age-related nuclear cataract, right eye: Secondary | ICD-10-CM | POA: Insufficient documentation

## 2022-02-02 HISTORY — PX: CATARACT EXTRACTION W/PHACO: SHX586

## 2022-02-02 SURGERY — PHACOEMULSIFICATION, CATARACT, WITH IOL INSERTION
Anesthesia: Monitor Anesthesia Care | Site: Eye | Laterality: Right

## 2022-02-02 MED ORDER — MOXIFLOXACIN HCL 0.5 % OP SOLN
OPHTHALMIC | Status: DC | PRN
  Administered 2022-02-02: 0.2 mL via OPHTHALMIC

## 2022-02-02 MED ORDER — ONDANSETRON HCL 4 MG/2ML IJ SOLN
4.0000 mg | Freq: Once | INTRAMUSCULAR | Status: DC | PRN
Start: 2022-02-02 — End: 2022-02-02

## 2022-02-02 MED ORDER — SIGHTPATH DOSE#1 BSS IO SOLN
INTRAOCULAR | Status: DC | PRN
  Administered 2022-02-02: 1 mL via INTRAMUSCULAR

## 2022-02-02 MED ORDER — ACETAMINOPHEN 325 MG PO TABS
325.0000 mg | ORAL_TABLET | ORAL | Status: DC | PRN
  Administered 2022-02-02: 650 mg via ORAL

## 2022-02-02 MED ORDER — SIGHTPATH DOSE#1 BSS IO SOLN
INTRAOCULAR | Status: DC | PRN
  Administered 2022-02-02: 62 mL via OPHTHALMIC

## 2022-02-02 MED ORDER — ACETAMINOPHEN 160 MG/5ML PO SOLN: 325.0000 mg | ORAL | Status: DC | PRN

## 2022-02-02 MED ORDER — TETRACAINE HCL 0.5 % OP SOLN
1.0000 [drp] | OPHTHALMIC | Status: DC | PRN
  Administered 2022-02-02 (×3): 1 [drp] via OPHTHALMIC

## 2022-02-02 MED ORDER — SIGHTPATH DOSE#1 NA HYALUR & NA CHOND-NA HYALUR IO KIT
PACK | INTRAOCULAR | Status: DC | PRN
  Administered 2022-02-02: 1 via OPHTHALMIC

## 2022-02-02 MED ORDER — BRIMONIDINE TARTRATE-TIMOLOL 0.2-0.5 % OP SOLN
OPHTHALMIC | Status: DC | PRN
  Administered 2022-02-02: 1 [drp] via OPHTHALMIC

## 2022-02-02 MED ORDER — SIGHTPATH DOSE#1 BSS IO SOLN
INTRAOCULAR | Status: DC | PRN
  Administered 2022-02-02: 15 mL

## 2022-02-02 MED ORDER — ARMC OPHTHALMIC DILATING DROPS
1.0000 "application " | OPHTHALMIC | Status: DC | PRN
  Administered 2022-02-02 (×3): 1 via OPHTHALMIC

## 2022-02-02 SURGICAL SUPPLY — 11 items
CATARACT SUITE SIGHTPATH (MISCELLANEOUS) ×2 IMPLANT
FEE CATARACT SUITE SIGHTPATH (MISCELLANEOUS) ×1 IMPLANT
GLOVE SRG 8 PF TXTR STRL LF DI (GLOVE) ×1 IMPLANT
GLOVE SURG ENC TEXT LTX SZ7.5 (GLOVE) ×2 IMPLANT
GLOVE SURG UNDER POLY LF SZ8 (GLOVE) ×2
LENS IOL TECNIS EYHANCE 21.0 (Intraocular Lens) ×1 IMPLANT
NDL FILTER BLUNT 18X1 1/2 (NEEDLE) ×1 IMPLANT
NEEDLE FILTER BLUNT 18X 1/2SAF (NEEDLE) ×1
NEEDLE FILTER BLUNT 18X1 1/2 (NEEDLE) ×1 IMPLANT
SYR 3ML LL SCALE MARK (SYRINGE) ×2 IMPLANT
WATER STERILE IRR 250ML POUR (IV SOLUTION) ×2 IMPLANT

## 2022-02-02 NOTE — Op Note (Signed)
LOCATION:  [facility]   PREOPERATIVE DIAGNOSIS:    Nuclear sclerotic cataract right eye. H25.11   POSTOPERATIVE DIAGNOSIS:  Nuclear sclerotic cataract right eye.     PROCEDURE:  Phacoemusification with posterior chamber intraocular lens placement of the right eye   ULTRASOUND TIME: Procedure(s) with comments: CATARACT EXTRACTION PHACO AND INTRAOCULAR LENS PLACEMENT (IOC) RIGHT (Right) - 4.52 00:45.9  LENS:   Implant Name Type Inv. Item Serial No. Manufacturer Lot No. LRB No. Used Action  LENS IOL TECNIS EYHANCE 21.0 - [id number] Intraocular Lens LENS IOL TECNIS EYHANCE 21.0 [id number] [employer]  Right 1 Implanted         SURGEON:  Wyonia Hough, MD   ANESTHESIA:  Topical with tetracaine drops and 2% Xylocaine jelly, augmented with 1% preservative-free intracameral lidocaine.    COMPLICATIONS:  None.   DESCRIPTION OF PROCEDURE:  The patient was identified in the holding room and transported to the operating room and placed in the supine position under the operating microscope.  The right eye was identified as the operative eye and it was prepped and draped in the usual sterile ophthalmic fashion.   A 1 millimeter clear-corneal paracentesis was made at the 12:00 position.  0.5 ml of preservative-free 1% lidocaine was injected into the anterior chamber. The anterior chamber was filled with Viscoat viscoelastic.  A 2.4 millimeter keratome was used to make a near-clear corneal incision at the 9:00 position.  A curvilinear capsulorrhexis was made with a cystotome and capsulorrhexis forceps.  Balanced salt solution was used to hydrodissect and hydrodelineate the nucleus.   Phacoemulsification was then used in stop and chop fashion to remove the lens nucleus and epinucleus.  The remaining cortex was then removed using the irrigation and aspiration handpiece. Provisc was then placed into the capsular bag to distend it for lens placement.  A lens was then injected into the  capsular bag.  The remaining viscoelastic was aspirated.   Wounds were hydrated with balanced salt solution.  The anterior chamber was inflated to a physiologic pressure with balanced salt solution.  No wound leaks were noted. Vigamox 0.2 ml of a '1mg'$  per ml solution was injected into the anterior chamber for a dose of 0.2 mg of intracameral antibiotic at the completion of the case.   Timolol and Brimonidine drops were applied to the eye.  The patient was taken to the recovery room in stable condition without complications of anesthesia or surgery.   Serinity Ware 02/02/2022, 8:58 AM

## 2022-02-02 NOTE — Anesthesia Postprocedure Evaluation (Signed)
Anesthesia Post Note  Patient: Sonya Harper  Procedure(s) Performed: CATARACT EXTRACTION PHACO AND INTRAOCULAR LENS PLACEMENT (IOC) RIGHT (Right: Eye)     Patient location during evaluation: PACU Anesthesia Type: MAC Level of consciousness: awake Pain management: pain level controlled Vital Signs Assessment: post-procedure vital signs reviewed and stable Respiratory status: respiratory function stable Cardiovascular status: stable Postop Assessment: no apparent nausea or vomiting Anesthetic complications: no   No notable events documented.  Veda Canning

## 2022-02-02 NOTE — Anesthesia Preprocedure Evaluation (Addendum)
Anesthesia Evaluation  Patient identified by MRN, date of birth, ID band Patient awake    Reviewed: Allergy & Precautions, NPO status   Airway Mallampati: II  TM Distance: >3 FB     Dental   Pulmonary asthma ,    Pulmonary exam normal        Cardiovascular hypertension, + dysrhythmias Atrial Fibrillation  Rhythm:Regular Rate:Normal  Last cardiology visit Jan 2023 - chest pain seems reproducible and not cardiac. Normal nuclear stress test in 2018, normal EF on echo in 2022   Neuro/Psych PSYCHIATRIC DISORDERS Depression    GI/Hepatic GERD  ,  Endo/Other  Hypothyroidism BMI 30  Renal/GU      Musculoskeletal  (+) Arthritis , Fibromyalgia -  Abdominal   Peds  Hematology   Anesthesia Other Findings   Reproductive/Obstetrics                            Anesthesia Physical Anesthesia Plan  ASA: 3  Anesthesia Plan: MAC   Post-op Pain Management: Minimal or no pain anticipated   Induction: Intravenous  PONV Risk Score and Plan: TIVA, Midazolam and Treatment may vary due to age or medical condition  Airway Management Planned: Natural Airway and Nasal Cannula  Additional Equipment:   Intra-op Plan:   Post-operative Plan:   Informed Consent: I have reviewed the patients History and Physical, chart, labs and discussed the procedure including the risks, benefits and alternatives for the proposed anesthesia with the patient or authorized representative who has indicated his/her understanding and acceptance.       Plan Discussed with: CRNA  Anesthesia Plan Comments:         Anesthesia Quick Evaluation

## 2022-02-02 NOTE — Transfer of Care (Signed)
Immediate Anesthesia Transfer of Care Note  Patient: Sonya Harper  Procedure(s) Performed: CATARACT EXTRACTION PHACO AND INTRAOCULAR LENS PLACEMENT (IOC) RIGHT (Right: Eye)  Patient Location: PACU  Anesthesia Type: MAC  Level of Consciousness: awake, alert  and patient cooperative  Airway and Oxygen Therapy: Patient Spontanous Breathing and Patient connected to supplemental oxygen  Post-op Assessment: Post-op Vital signs reviewed, Patient's Cardiovascular Status Stable, Respiratory Function Stable, Patent Airway and No signs of Nausea or vomiting  Post-op Vital Signs: Reviewed and stable  Complications: No notable events documented.

## 2022-02-02 NOTE — H&P (Signed)
[facility]   Primary Care Physician:  Teodora Medici, DO Ophthalmologist: Dr. Leandrew Koyanagi  Pre-Procedure History & Physical: HPI:  Sonya Harper is a 76 y.o. female here for ophthalmic surgery.   Past Medical History:  Diagnosis Date   A-fib [facility])    Allergy    Arthritis    feet   Asthma    Breast neoplasm    Chronic lower back pain    s/p fall injury   Depression    Dysrhythmia    Fibromyalgia    Hypertension    Hypothyroidism    Lumbar facet arthropathy 11/06/2018   Prediabetes 02/27/2018   Sciatica of right side    Wears dentures    partial upper and lower    Past Surgical History:  Procedure Laterality Date   ABDOMINAL HYSTERECTOMY     COLONOSCOPY WITH PROPOFOL N/A 05/13/2016   Procedure: COLONOSCOPY WITH PROPOFOL;  Surgeon: Lucilla Lame, MD;  Location: [place];  Service: Endoscopy;  Laterality: N/A;   COLONOSCOPY WITH PROPOFOL N/A 09/20/2019   Procedure: COLONOSCOPY WITH PROPOFOL;  Surgeon: Lucilla Lame, MD;  Location: [facility] ENDOSCOPY;  Service: Endoscopy;  Laterality: N/A;   ELECTROPHYSIOLOGIC STUDY     Ablation   ESOPHAGOGASTRODUODENOSCOPY (EGD) WITH PROPOFOL N/A 09/20/2019   Procedure: ESOPHAGOGASTRODUODENOSCOPY (EGD) WITH PROPOFOL;  Surgeon: Lucilla Lame, MD;  Location: [facility] ENDOSCOPY;  Service: Endoscopy;  Laterality: N/A;   POLYPECTOMY N/A 05/13/2016   Procedure: POLYPECTOMY;  Surgeon: Lucilla Lame, MD;  Location: [place];  Service: Endoscopy;  Laterality: N/A;    Prior to Admission medications   Medication Sig Start Date End Date Taking? Authorizing Provider  albuterol (VENTOLIN HFA) 108 (90 Base) MCG/ACT inhaler INHALE 2 PUFFS EVERY 6 HOURS AS NEEDED FOR SHORTNESS OF BREATH 07/02/20  Yes Towanda Malkin, MD  albuterol (VENTOLIN HFA) 108 (90 Base) MCG/ACT inhaler Inhale 2 puffs into the lungs every 6 (six) hours as needed for wheezing or shortness of breath. 02/09/21  Yes Sable Feil, PA-C  amLODipine  (NORVASC) 5 MG tablet Take 1 tablet (5 mg total) by mouth daily. 01/25/22  Yes Teodora Medici, DO  apixaban (ELIQUIS) 5 MG TABS tablet Take by mouth. 07/18/19  Yes [provider]  atorvastatin (LIPITOR) 10 MG tablet TAKE ONE TABLET BY MOUTH EVERY OTHER DAY 06/30/21  Yes Myles Gip, DO  Cholecalciferol (VITAMIN D-1000 MAX ST) 25 MCG (1000 UT) tablet Take by mouth.   Yes [provider]  ciclopirox (PENLAC) 8 % solution Apply topically at bedtime. Apply over nail and surrounding skin. Apply daily over previous coat. After seven (7) days, may remove with alcohol and continue cycle. 12/28/21  Yes Felipa Furnace, DPM  DIPHENHYDRAMINE HCL, TOPICAL, (BENADRYL ITCH STOPPING) 2 % GEL Apply 1 application topically in the morning, at noon, and at bedtime. 02/09/21  Yes Sable Feil, PA-C  EPINEPHrine (EPIPEN 2-PAK) 0.3 mg/0.3 mL IJ SOAJ injection Use per package directions; one injection subcutaneously for LIFE-THREATENING emergency, deadly reaction 10/05/21  Yes Teodora Medici, DO  fluticasone [facility]) 50 MCG/ACT nasal spray Place 2 sprays into both nostrils daily. 12/11/18  Yes Poulose, Bethel Born, NP  fluticasone furoate-vilanterol (BREO ELLIPTA) 200-25 MCG/ACT AEPB Inhale 1 puff into the lungs daily. 01/25/22  Yes Teodora Medici, DO  losartan (COZAAR) 25 MG tablet Take 1 tablet (25 mg total) by mouth daily. 01/25/22  Yes Teodora Medici, DO  nitroGLYCERIN (NITROSTAT) 0.4 MG SL tablet Place 1 tablet (0.4 mg total) under the tongue every  5 (five) minutes as needed for chest pain. Max 3 pills per episode 08/30/18  Yes Lada, Satira Anis, MD  pantoprazole (PROTONIX) 40 MG tablet Take 1 tablet (40 mg total) by mouth daily. 01/25/22  Yes Teodora Medici, DO  SYNTHROID 50 MCG tablet TAKE 1 BY MOUTH DAILY. DO NOT SUBSTITUTE 12/04/14  Yes [provider]    Allergies as of 12/17/2021 - Review Complete 10/19/2021  Allergen Reaction Noted   Molds & smuts Anaphylaxis  04/07/2016   Penicillins Nausea And Vomiting and Rash 03/03/2015    Family History  Problem Relation Age of Onset   Stroke Mother    Breast cancer Sister 73       breast ca x3   Colon cancer Paternal Aunt    Cancer Sister     Social History   Socioeconomic History   Marital status: Married    Spouse name: Not on file   Number of children: Not on file   Years of education: Not on file   Highest education level: Not on file  Occupational History   Not on file  Tobacco Use   Smoking status: Never   Smokeless tobacco: Never  Vaping Use   Vaping Use: Never used  Substance and Sexual Activity   Alcohol use: No    Alcohol/week: 0.0 standard drinks   Drug use: Never   Sexual activity: Not Currently    Birth control/protection: Post-menopausal  Other Topics Concern   Not on file  Social History Narrative   Not on file   Social Determinants of Health   Financial Resource Strain: Not on file  Food Insecurity: Not on file  Transportation Needs: Not on file  Physical Activity: Not on file  Stress: Not on file  Social Connections: Not on file  Intimate Partner Violence: Not on file    Review of Systems: See HPI, otherwise negative ROS  Physical Exam: BP (!) 157/82   Pulse 90   Temp 98.1 F (36.7 C) (Temporal)   Resp 16   Ht '5\' 5"'$  (1.651 m)   Wt 83.9 kg   SpO2 96%   BMI 30.79 kg/m  General:   Alert,  pleasant and cooperative in NAD Head:  Normocephalic and atraumatic. Lungs:  Clear to auscultation.   shallow Heart:  Regular rate and rhythm.   Impression/Plan: Sonya Harper is here for ophthalmic surgery.  Risks, benefits, limitations, and alternatives regarding ophthalmic surgery have been reviewed with the patient.  Questions have been answered.  All parties agreeable.   Leandrew Koyanagi, MD  02/02/2022, 7:41 AM

## 2022-02-03 ENCOUNTER — Encounter: Payer: Self-pay | Admitting: Ophthalmology

## 2022-02-07 ENCOUNTER — Ambulatory Visit (INDEPENDENT_AMBULATORY_CARE_PROVIDER_SITE_OTHER): Payer: Medicare Other | Admitting: Family Medicine

## 2022-02-07 ENCOUNTER — Encounter: Payer: Self-pay | Admitting: Family Medicine

## 2022-02-07 VITALS — Ht 65.0 in | Wt 184.1 lb

## 2022-02-07 DIAGNOSIS — J45901 Unspecified asthma with (acute) exacerbation: Secondary | ICD-10-CM

## 2022-02-07 DIAGNOSIS — J310 Chronic rhinitis: Secondary | ICD-10-CM | POA: Diagnosis not present

## 2022-02-07 DIAGNOSIS — J329 Chronic sinusitis, unspecified: Secondary | ICD-10-CM | POA: Diagnosis not present

## 2022-02-07 MED ORDER — PREDNISONE 20 MG PO TABS: 40.0000 mg | ORAL_TABLET | Freq: Every day | ORAL | 0 refills | Status: AC

## 2022-02-07 MED ORDER — PROMETHAZINE-DM 6.25-15 MG/5ML PO SYRP: 5.0000 mL | ORAL_SOLUTION | Freq: Three times a day (TID) | ORAL | 1 refills | Status: DC | PRN

## 2022-02-07 MED ORDER — MONTELUKAST SODIUM 10 MG PO TABS: 10.0000 mg | ORAL_TABLET | Freq: Every day | ORAL | 3 refills | Status: DC

## 2022-02-07 NOTE — Progress Notes (Signed)
Name: Sonya Harper   MRN: [id number]    DOB: 09-Nov-1945   Date:02/07/2022       Progress Note  Subjective:    Chief Complaint  Chief Complaint  Patient presents with   Cough    Ongoing cough worst at night, previously seen for same reason. Pt unable to give me vital signs.    I connected with  [name] on 02/07/22 at 10:40 AM EDT by telephone and verified that I am speaking with the correct person using two identifiers.   I discussed the limitations, risks, security and privacy concerns of performing an evaluation and management service by telephone and the availability of in person appointments. Staff also discussed with the patient that there may be a patient responsible charge related to this service.  Patient verbalized understanding and agreed to proceed with encounter. Patient Location: home Provider Location: [facility] Additional Individuals present: none  HPI Last OV discussed coughing and uncontrolled asthma  Some nasal sx They did increase the maintenance inhaler but sx are not any better When she previously went to urgent care a cough syrup was very helpful. No recent steroid burst Overall she feels asthma is not well controlled She is not on allergy meds (antihistamines) and she thinks in the past she may have been on singulair  Asthma:  -Asthma status: uncontrolled -Current Treatments: Breo-Ellipta 100, Albuterol PRN -Satisfied with current treatment?: no -Albuterol/rescue inhaler frequency: Daily for the last 2 weeks -Dyspnea frequency: Daily -Wheezing frequency: On and off but more frequent recently -Cough frequency: Daily, nonproductive -Nocturnal symptom frequency: every night for the last week  -Limitation of activity: no -Current upper respiratory symptoms: no -Visits to ER or Urgent Care in past year: no -Pneumovax:  Prevnar 23 in 2018, 13 in 2016 -Influenza: Up to Date     Patient Active Problem List   Diagnosis Date Noted    Persistent asthma with acute exacerbation 04/24/2020   Polyp of transverse colon    Polyp of ascending colon    Chronic anticoagulation 09/12/2019   Paroxysmal atrial fibrillation ([place]) 09/12/2019   AKI (acute kidney injury) ([place]) 06/26/2019   Grade I diastolic dysfunction 69/48/5462   Mild mitral regurgitation 06/26/2019   Mild tricuspid regurgitation 06/26/2019   Lumbar facet arthropathy 11/06/2018   Prediabetes 02/27/2018   Medication monitoring encounter 11/13/2017   Abnormal laboratory test 10/16/2017   Palpitations 08/23/2017   Breast pain in female 08/10/2017   Obesity (BMI 30.0-34.9) 08/10/2017   Protein in urine 08/10/2017   Vaginal atrophy 08/10/2017   Benign essential HTN 11/16/2016   Hyperlipidemia, mixed 11/16/2016   SOBOE (shortness of breath on exertion) 11/16/2016   Stable angina pectoris ([place]) 11/16/2016   Dysrhythmia 07/07/2016   Right low back pain 07/07/2016   Fall on or from stairs or steps 07/07/2016   Family history of malignant neoplasm of gastrointestinal tract    Benign neoplasm of ascending colon    Benign neoplasm of transverse colon    Benign neoplasm of descending colon    Goiter 03/03/2015   Asthma, mild persistent 03/03/2015   Gastroesophageal reflux disease without esophagitis 03/03/2015    Social History   Tobacco Use   Smoking status: Never   Smokeless tobacco: Never  Substance Use Topics   Alcohol use: No    Alcohol/week: 0.0 standard drinks     Current Outpatient Medications:    albuterol (VENTOLIN HFA) 108 (90 Base) MCG/ACT inhaler, INHALE 2 PUFFS EVERY 6 HOURS AS NEEDED FOR  SHORTNESS OF BREATH, Disp: 6.7 g, Rfl: 1   albuterol (VENTOLIN HFA) 108 (90 Base) MCG/ACT inhaler, Inhale 2 puffs into the lungs every 6 (six) hours as needed for wheezing or shortness of breath., Disp: 1 each, Rfl: 2   amLODipine (NORVASC) 5 MG tablet, Take 1 tablet (5 mg total) by mouth daily., Disp: 90 tablet, Rfl: 0   apixaban (ELIQUIS) 5 MG TABS tablet,  Take by mouth., Disp: , Rfl:    atorvastatin (LIPITOR) 10 MG tablet, TAKE ONE TABLET BY MOUTH EVERY OTHER DAY, Disp: 45 tablet, Rfl: 3   Cholecalciferol (VITAMIN D-1000 MAX ST) 25 MCG (1000 UT) tablet, Take by mouth., Disp: , Rfl:    ciclopirox (PENLAC) 8 % solution, Apply topically at bedtime. Apply over nail and surrounding skin. Apply daily over previous coat. After seven (7) days, may remove with alcohol and continue cycle., Disp: 6.6 mL, Rfl: 0   DIPHENHYDRAMINE HCL, TOPICAL, (BENADRYL ITCH STOPPING) 2 % GEL, Apply 1 application topically in the morning, at noon, and at bedtime., Disp: 118 mL, Rfl: 1   EPINEPHrine (EPIPEN 2-PAK) 0.3 mg/0.3 mL IJ SOAJ injection, Use per package directions; one injection subcutaneously for LIFE-THREATENING emergency, deadly reaction, Disp: 2 each, Rfl: 1   fluticasone (FLONASE) 50 MCG/ACT nasal spray, Place 2 sprays into both nostrils daily., Disp: 16 g, Rfl: 6   fluticasone furoate-vilanterol (BREO ELLIPTA) 200-25 MCG/ACT AEPB, Inhale 1 puff into the lungs daily., Disp: 1 each, Rfl: 11   losartan (COZAAR) 25 MG tablet, Take 1 tablet (25 mg total) by mouth daily., Disp: 90 tablet, Rfl: 0   nitroGLYCERIN (NITROSTAT) 0.4 MG SL tablet, Place 1 tablet (0.4 mg total) under the tongue every 5 (five) minutes as needed for chest pain. Max 3 pills per episode, Disp: 50 tablet, Rfl: 3   pantoprazole (PROTONIX) 40 MG tablet, Take 1 tablet (40 mg total) by mouth daily., Disp: 90 tablet, Rfl: 1   SYNTHROID 50 MCG tablet, TAKE 1 BY MOUTH DAILY. DO NOT SUBSTITUTE, Disp: , Rfl: 0  Allergies  Allergen Reactions   Molds & Smuts Anaphylaxis   Penicillins Nausea And Vomiting and Rash    Has patient had a PCN reaction causing immediate rash, facial/tongue/throat swelling, SOB or lightheadedness with hypotension: Yes Has patient had a PCN reaction causing severe rash involving mucus membranes or skin necrosis: No Has patient had a PCN reaction that required hospitalization: No Has  patient had a PCN reaction occurring within the last 10 years: No If all of the above answers are "NO", then may proceed with Cephalosporin use.     Chart Review: I personally reviewed active problem list, medication list, allergies, family history, social history, health maintenance, notes from last encounter, lab results, imaging with the patient/caregiver today.  Review of Systems  Constitutional: Negative.  Negative for appetite change, chills, fatigue and fever.  HENT: Negative.    Eyes: Negative.   Respiratory:  Positive for cough, shortness of breath and wheezing. Negative for choking.   Cardiovascular: Negative.  Negative for chest pain.  Gastrointestinal: Negative.   Endocrine: Negative.   Genitourinary: Negative.   Musculoskeletal: Negative.   Skin: Negative.   Allergic/Immunologic: Negative.   Neurological: Negative.   Hematological: Negative.   Psychiatric/Behavioral: Negative.    All other systems reviewed and are negative.   Objective:    Virtual encounter, vitals limited, only able to obtain the following Today's Vitals   02/07/22 1033  Weight: 184 lb 1.6 oz (83.5 kg)  Height: '5\' 5"'$  (  1.651 m)   Body mass index is 30.64 kg/m. Nursing Note and Vital Signs reviewed.  Physical Exam Vitals and nursing note reviewed.  Pulmonary:     Effort: No respiratory distress.     Comments: Able to speak in full and complete sentences, no audible wheeze, no coughing during encounter Neurological:     Mental Status: She is alert.  Psychiatric:        Mood and Affect: Mood normal.        Behavior: Behavior normal.    PE limited by telephone encounter  No results found for this or any previous visit (from the past 72 hour(s)).  Assessment and Plan:     ICD-10-CM   1. Persistent asthma with acute exacerbation, unspecified asthma severity  J45.901 promethazine-dextromethorphan (PROMETHAZINE-DM) 6.25-15 MG/5ML syrup    montelukast (SINGULAIR) 10 MG tablet     predniSONE (DELTASONE) 20 MG tablet    2. Rhinosinusitis  J31.0 montelukast (SINGULAIR) 10 MG tablet   J32.9     Pt just increased maintenance inhaler dose, continues to cough and have respiratory sx beyond what her baseline is.  She does feel like steroids would be helpful.  She endorses nasal symptoms this is likely contributory to her worsening cough at night, I have recommended that she add an antihistamine like Claritin, Zyrtec, Allegra at bedtime and she can also try adding Singulair which may help control the nasal symptoms and allergy control I explained the cough syrup is similar to Delsym or Robitussin and will not likely get rid of the underlying cause of her cough but only minimize her symptoms temporarily.  After steroid burst and starting allergy medications I recommended that she follow-up with me or with her PCP to see if baseline asthma is better controlled or if her inhaler needs to be changed again we discussed and I recommended Trelegy -she is unclear about her insurance coverage and at this time declined to make her follow-up appointment she wanted to "just wait and see".  -Red flags and when to present for emergency care or RTC including but not limited to new/worsening/un-resolving symptoms, reviewed with patient at time of visit. Follow up and care instructions discussed and provided in AVS. - I discussed the assessment and treatment plan with the patient. The patient was provided an opportunity to ask questions and all were answered. The patient agreed with the plan and demonstrated an understanding of the instructions.  - The patient was advised to call back or seek an in-person evaluation if the symptoms worsen or if the condition fails to improve as anticipated.  I provided 20+ minutes of non-face-to-face time during this encounter. Prior OV and UC visits were reviewed, plus OV and telephone time spent today and additional time to document and send in meds.    02/07/22  10:42 AM

## 2022-02-14 NOTE — Discharge Instructions (Signed)

## 2022-02-15 ENCOUNTER — Other Ambulatory Visit: Payer: Self-pay | Admitting: Internal Medicine

## 2022-02-15 NOTE — Telephone Encounter (Signed)
Medication Refill - Medication:  albuterol (VENTOLIN HFA) 108 (90 Base) MCG/ACT inhaler  Has the patient contacted their pharmacy? yes (Agent: If no, request that the patient contact the pharmacy for the refill. If patient does not wish to contact the pharmacy document the reason why and proceed with request.) (Agent: If yes, when and what did the pharmacy advise?)pt said it was sent in suday from pharmacy, but no record in system, so I put it in  Preferred Pharmacy (with phone number or street name): [facility] [id number], [place]  [place] [address]  Phone: [phone] Fax: [phone]  Hours: Not open 24 hours   Has the patient been seen for an appointment in the last year OR does the patient have an upcoming appointment? yes  Agent: Please be advised that RX refills may take up to 3 business days. We ask that you follow-up with your pharmacy.

## 2022-02-16 ENCOUNTER — Encounter
Admission: RE | Disposition: A | Discharge status: Home / Self Care | Payer: Self-pay | Source: Home / Self Care | Attending: Ophthalmology

## 2022-02-16 ENCOUNTER — Ambulatory Visit: Payer: Medicare Other | Admitting: Anesthesiology

## 2022-02-16 ENCOUNTER — Ambulatory Visit
Admission: RE | Admit: 2022-02-16 | Discharge: 2022-02-16 | Disposition: A | Discharge status: Home / Self Care | Payer: Medicare Other | Attending: Ophthalmology | Admitting: Ophthalmology

## 2022-02-16 ENCOUNTER — Encounter: Payer: Self-pay | Admitting: Ophthalmology

## 2022-02-16 ENCOUNTER — Other Ambulatory Visit: Payer: Self-pay

## 2022-02-16 DIAGNOSIS — I10 Essential (primary) hypertension: Secondary | ICD-10-CM | POA: Insufficient documentation

## 2022-02-16 DIAGNOSIS — I4891 Unspecified atrial fibrillation: Secondary | ICD-10-CM | POA: Diagnosis not present

## 2022-02-16 DIAGNOSIS — M1990 Unspecified osteoarthritis, unspecified site: Secondary | ICD-10-CM | POA: Diagnosis not present

## 2022-02-16 DIAGNOSIS — M797 Fibromyalgia: Secondary | ICD-10-CM | POA: Diagnosis not present

## 2022-02-16 DIAGNOSIS — K219 Gastro-esophageal reflux disease without esophagitis: Secondary | ICD-10-CM | POA: Insufficient documentation

## 2022-02-16 DIAGNOSIS — H2512 Age-related nuclear cataract, left eye: Secondary | ICD-10-CM | POA: Insufficient documentation

## 2022-02-16 DIAGNOSIS — J45909 Unspecified asthma, uncomplicated: Secondary | ICD-10-CM | POA: Diagnosis not present

## 2022-02-16 DIAGNOSIS — R7303 Prediabetes: Secondary | ICD-10-CM

## 2022-02-16 DIAGNOSIS — E039 Hypothyroidism, unspecified: Secondary | ICD-10-CM | POA: Diagnosis not present

## 2022-02-16 HISTORY — PX: CATARACT EXTRACTION W/PHACO: SHX586

## 2022-02-16 SURGERY — PHACOEMULSIFICATION, CATARACT, WITH IOL INSERTION
Anesthesia: Monitor Anesthesia Care | Site: Eye | Laterality: Left

## 2022-02-16 MED ORDER — TETRACAINE HCL 0.5 % OP SOLN
1.0000 [drp] | OPHTHALMIC | Status: DC | PRN
  Administered 2022-02-16 (×3): 1 [drp] via OPHTHALMIC

## 2022-02-16 MED ORDER — MIDAZOLAM HCL 2 MG/2ML IJ SOLN
INTRAMUSCULAR | Status: DC | PRN
  Administered 2022-02-16: 1 mg via INTRAVENOUS

## 2022-02-16 MED ORDER — SIGHTPATH DOSE#1 NA HYALUR & NA CHOND-NA HYALUR IO KIT
PACK | INTRAOCULAR | Status: DC | PRN
  Administered 2022-02-16: 1 via OPHTHALMIC

## 2022-02-16 MED ORDER — SIGHTPATH DOSE#1 BSS IO SOLN
INTRAOCULAR | Status: DC | PRN
  Administered 2022-02-16: 1 mL via INTRAMUSCULAR

## 2022-02-16 MED ORDER — FENTANYL CITRATE (PF) 100 MCG/2ML IJ SOLN
INTRAMUSCULAR | Status: DC | PRN
  Administered 2022-02-16: 50 ug via INTRAVENOUS

## 2022-02-16 MED ORDER — LACTATED RINGERS IV SOLN: INTRAVENOUS | Status: DC

## 2022-02-16 MED ORDER — BRIMONIDINE TARTRATE-TIMOLOL 0.2-0.5 % OP SOLN
OPHTHALMIC | Status: DC | PRN
  Administered 2022-02-16: 1 [drp] via OPHTHALMIC

## 2022-02-16 MED ORDER — ARMC OPHTHALMIC DILATING DROPS
1.0000 "application " | OPHTHALMIC | Status: DC | PRN
  Administered 2022-02-16 (×3): 1 via OPHTHALMIC

## 2022-02-16 MED ORDER — ALBUTEROL SULFATE HFA 108 (90 BASE) MCG/ACT IN AERS: 2.0000 | INHALATION_SPRAY | Freq: Four times a day (QID) | RESPIRATORY_TRACT | 2 refills | Status: DC | PRN

## 2022-02-16 MED ORDER — SIGHTPATH DOSE#1 BSS IO SOLN
INTRAOCULAR | Status: DC | PRN
  Administered 2022-02-16: 15 mL

## 2022-02-16 MED ORDER — ACETAMINOPHEN 325 MG PO TABS
650.0000 mg | ORAL_TABLET | Freq: Once | ORAL | Status: AC
  Administered 2022-02-16: 650 mg via ORAL

## 2022-02-16 MED ORDER — SIGHTPATH DOSE#1 BSS IO SOLN
INTRAOCULAR | Status: DC | PRN
  Administered 2022-02-16: 86 mL via OPHTHALMIC

## 2022-02-16 MED ORDER — MOXIFLOXACIN HCL 0.5 % OP SOLN
OPHTHALMIC | Status: DC | PRN
  Administered 2022-02-16: 0.2 mL via OPHTHALMIC

## 2022-02-16 SURGICAL SUPPLY — 11 items
CATARACT SUITE SIGHTPATH (MISCELLANEOUS) ×2 IMPLANT
FEE CATARACT SUITE SIGHTPATH (MISCELLANEOUS) ×1 IMPLANT
GLOVE SRG 8 PF TXTR STRL LF DI (GLOVE) ×1 IMPLANT
GLOVE SURG ENC TEXT LTX SZ7.5 (GLOVE) ×2 IMPLANT
GLOVE SURG UNDER POLY LF SZ8 (GLOVE) ×2
LENS IOL TECNIS EYHANCE 21.5 (Intraocular Lens) ×1 IMPLANT
NDL FILTER BLUNT 18X1 1/2 (NEEDLE) ×1 IMPLANT
NEEDLE FILTER BLUNT 18X 1/2SAF (NEEDLE) ×1
NEEDLE FILTER BLUNT 18X1 1/2 (NEEDLE) ×1 IMPLANT
SYR 3ML LL SCALE MARK (SYRINGE) ×2 IMPLANT
WATER STERILE IRR 250ML POUR (IV SOLUTION) ×2 IMPLANT

## 2022-02-16 NOTE — Op Note (Signed)
  OPERATIVE NOTE  [name] [id number] 02/16/2022   PREOPERATIVE DIAGNOSIS:  Nuclear sclerotic cataract left eye. H25.12   POSTOPERATIVE DIAGNOSIS:    Nuclear sclerotic cataract left eye.     PROCEDURE:  Phacoemusification with posterior chamber intraocular lens placement of the left eye  Ultrasound time: Procedure(s): CATARACT EXTRACTION PHACO AND INTRAOCULAR LENS PLACEMENT (IOC) LEFT 7.05 01:19.8 (Left)  LENS:   Implant Name Type Inv. Item Serial No. Manufacturer Lot No. LRB No. Used Action  LENS IOL TECNIS EYHANCE 21.5 - [id number] Intraocular Lens LENS IOL TECNIS EYHANCE 21.5 [id number] [employer]  Left 1 Implanted      SURGEON:  Wyonia Hough, MD   ANESTHESIA:  Topical with tetracaine drops and 2% Xylocaine jelly, augmented with 1% preservative-free intracameral lidocaine.    COMPLICATIONS:  None.   DESCRIPTION OF PROCEDURE:  The patient was identified in the holding room and transported to the operating room and placed in the supine position under the operating microscope.  The left eye was identified as the operative eye and it was prepped and draped in the usual sterile ophthalmic fashion.   A 1 millimeter clear-corneal paracentesis was made at the 1:30 position.  0.5 ml of preservative-free 1% lidocaine was injected into the anterior chamber.  The anterior chamber was filled with Viscoat viscoelastic.  A 2.4 millimeter keratome was used to make a near-clear corneal incision at the 10:30 position.  .  A curvilinear capsulorrhexis was made with a cystotome and capsulorrhexis forceps.  Balanced salt solution was used to hydrodissect and hydrodelineate the nucleus.   Phacoemulsification was then used in stop and chop fashion to remove the lens nucleus and epinucleus.  The remaining cortex was then removed using the irrigation and aspiration handpiece. Provisc was then placed into the capsular bag to distend it for lens placement.  A lens was then injected into the  capsular bag.  The remaining viscoelastic was aspirated.   Wounds were hydrated with balanced salt solution.  The anterior chamber was inflated to a physiologic pressure with balanced salt solution.  No wound leaks were noted. Vigamox 0.2 ml of a '1mg'$  per ml solution was injected into the anterior chamber for a dose of 0.2 mg of intracameral antibiotic at the completion of the case.   Timolol and Brimonidine drops were applied to the eye.  The patient was taken to the recovery room in stable condition without complications of anesthesia or surgery.  Sonya Harper 02/16/2022, 10:04 AM

## 2022-02-16 NOTE — Telephone Encounter (Signed)
Requested medication (s) are due for refill today: yes  Requested medication (s) are on the active medication list: yes  Last refill:  02/09/21 1 each 2 RF  Future visit scheduled: yes  Notes to clinic:  med prescribed by a different prescriber previusly   Requested Prescriptions  Pending Prescriptions Disp Refills   albuterol (VENTOLIN HFA) 108 (90 Base) MCG/ACT inhaler 1 each 2    Sig: Inhale 2 puffs into the lungs every 6 (six) hours as needed for wheezing or shortness of breath.     Pulmonology:  Beta Agonists 2 Passed - 02/15/2022  5:34 PM      Passed - Last BP in normal range    BP Readings from Last 1 Encounters:  02/02/22 138/78         Passed - Last Heart Rate in normal range    Pulse Readings from Last 1 Encounters:  02/02/22 75         Passed - Valid encounter within last 12 months    Recent Outpatient Visits           1 week ago Persistent asthma with acute exacerbation, unspecified asthma severity   [facility] Delsa Grana, PA-C   3 weeks ago Benign essential HTN   [place], DO   4 months ago Encounter for general adult medical examination with abnormal findings   [place], DO   10 months ago Regurgitation of food   [facility] Rory Percy M, DO   10 months ago Cough   [facility] Myles Gip, DO       Future Appointments             In 2 months Teodora Medici, [facility], [facility]

## 2022-02-16 NOTE — Transfer of Care (Signed)
Immediate Anesthesia Transfer of Care Note  Patient: Sonya Harper  Procedure(s) Performed: CATARACT EXTRACTION PHACO AND INTRAOCULAR LENS PLACEMENT (IOC) LEFT 7.05 01:19.8 (Left: Eye)  Patient Location: PACU  Anesthesia Type: MAC  Level of Consciousness: awake, alert  and patient cooperative  Airway and Oxygen Therapy: Patient Spontanous Breathing and Patient connected to supplemental oxygen  Post-op Assessment: Post-op Vital signs reviewed, Patient's Cardiovascular Status Stable, Respiratory Function Stable, Patent Airway and No signs of Nausea or vomiting  Post-op Vital Signs: Reviewed and stable  Complications: No notable events documented.

## 2022-02-16 NOTE — Anesthesia Postprocedure Evaluation (Signed)
Anesthesia Post Note  Patient: Sonya Harper  Procedure(s) Performed: CATARACT EXTRACTION PHACO AND INTRAOCULAR LENS PLACEMENT (IOC) LEFT 7.05 01:19.8 (Left: Eye)     Patient location during evaluation: PACU Anesthesia Type: MAC Level of consciousness: awake and alert Pain management: pain level controlled Vital Signs Assessment: post-procedure vital signs reviewed and stable Respiratory status: spontaneous breathing and nonlabored ventilation Cardiovascular status: blood pressure returned to baseline Postop Assessment: no apparent nausea or vomiting Anesthetic complications: no   No notable events documented.  Laryn Venning [employer]

## 2022-02-16 NOTE — H&P (Signed)
[facility]   Primary Care Physician:  Teodora Medici, DO Ophthalmologist: Dr. Leandrew Koyanagi  Pre-Procedure History & Physical: HPI:  Sonya Harper is a 76 y.o. female here for ophthalmic surgery.   Past Medical History:  Diagnosis Date   A-fib [facility])    Allergy    Arthritis    feet   Asthma    Breast neoplasm    Chronic lower back pain    s/p fall injury   Depression    Dysrhythmia    Fibromyalgia    Hypertension    Hypothyroidism    Lumbar facet arthropathy 11/06/2018   Prediabetes 02/27/2018   Sciatica of right side    Wears dentures    partial upper and lower    Past Surgical History:  Procedure Laterality Date   ABDOMINAL HYSTERECTOMY     CATARACT EXTRACTION W/PHACO Right 02/02/2022   Procedure: CATARACT EXTRACTION PHACO AND INTRAOCULAR LENS PLACEMENT ([place]) RIGHT;  Surgeon: Leandrew Koyanagi, MD;  Location: [place];  Service: Ophthalmology;  Laterality: Right;  4.52 00:45.9   COLONOSCOPY WITH PROPOFOL N/A 05/13/2016   Procedure: COLONOSCOPY WITH PROPOFOL;  Surgeon: Lucilla Lame, MD;  Location: [place];  Service: Endoscopy;  Laterality: N/A;   COLONOSCOPY WITH PROPOFOL N/A 09/20/2019   Procedure: COLONOSCOPY WITH PROPOFOL;  Surgeon: Lucilla Lame, MD;  Location: [facility] ENDOSCOPY;  Service: Endoscopy;  Laterality: N/A;   ELECTROPHYSIOLOGIC STUDY     Ablation   ESOPHAGOGASTRODUODENOSCOPY (EGD) WITH PROPOFOL N/A 09/20/2019   Procedure: ESOPHAGOGASTRODUODENOSCOPY (EGD) WITH PROPOFOL;  Surgeon: Lucilla Lame, MD;  Location: [facility] ENDOSCOPY;  Service: Endoscopy;  Laterality: N/A;   POLYPECTOMY N/A 05/13/2016   Procedure: POLYPECTOMY;  Surgeon: Lucilla Lame, MD;  Location: [place];  Service: Endoscopy;  Laterality: N/A;    Prior to Admission medications   Medication Sig Start Date End Date Taking? Authorizing Provider  albuterol (VENTOLIN HFA) 108 (90 Base) MCG/ACT inhaler INHALE 2 PUFFS EVERY 6 HOURS AS NEEDED FOR  SHORTNESS OF BREATH 07/02/20   Towanda Malkin, MD  albuterol (VENTOLIN HFA) 108 (90 Base) MCG/ACT inhaler Inhale 2 puffs into the lungs every 6 (six) hours as needed for wheezing or shortness of breath. 02/09/21   Sable Feil, PA-C  amLODipine (NORVASC) 5 MG tablet Take 1 tablet (5 mg total) by mouth daily. 01/25/22   Teodora Medici, DO  apixaban (ELIQUIS) 5 MG TABS tablet Take by mouth. 07/18/19   [provider]  atorvastatin (LIPITOR) 10 MG tablet TAKE ONE TABLET BY MOUTH EVERY OTHER DAY 06/30/21   Myles Gip, DO  Cholecalciferol (VITAMIN D-1000 MAX ST) 25 MCG (1000 UT) tablet Take by mouth.    [provider]  ciclopirox (PENLAC) 8 % solution Apply topically at bedtime. Apply over nail and surrounding skin. Apply daily over previous coat. After seven (7) days, may remove with alcohol and continue cycle. 12/28/21   Felipa Furnace, DPM  DIPHENHYDRAMINE HCL, TOPICAL, (BENADRYL ITCH STOPPING) 2 % GEL Apply 1 application topically in the morning, at noon, and at bedtime. 02/09/21   Sable Feil, PA-C  EPINEPHrine (EPIPEN 2-PAK) 0.3 mg/0.3 mL IJ SOAJ injection Use per package directions; one injection subcutaneously for LIFE-THREATENING emergency, deadly reaction 10/05/21   Teodora Medici, DO  fluticasone [facility]) 50 MCG/ACT nasal spray Place 2 sprays into both nostrils daily. 12/11/18   Poulose, Bethel Born, NP  fluticasone furoate-vilanterol (BREO ELLIPTA) 200-25 MCG/ACT AEPB Inhale 1 puff into the lungs daily. 01/25/22   Teodora Medici, DO  losartan (COZAAR) 25 MG tablet Take 1 tablet (25 mg total) by mouth daily. 01/25/22   Teodora Medici, DO  montelukast (SINGULAIR) 10 MG tablet Take 1 tablet (10 mg total) by mouth at bedtime. 02/07/22   Delsa Grana, PA-C  nitroGLYCERIN (NITROSTAT) 0.4 MG SL tablet Place 1 tablet (0.4 mg total) under the tongue every 5 (five) minutes as needed for chest pain. Max 3 pills per episode 08/30/18   Arnetha Courser, MD   pantoprazole (PROTONIX) 40 MG tablet Take 1 tablet (40 mg total) by mouth daily. 01/25/22   Teodora Medici, DO  promethazine-dextromethorphan (PROMETHAZINE-DM) 6.25-15 MG/5ML syrup Take 5 mLs by mouth 3 (three) times daily as needed for cough. 02/07/22   Delsa Grana, PA-C  SYNTHROID 50 MCG tablet TAKE 1 BY MOUTH DAILY. DO NOT SUBSTITUTE 12/04/14   [provider]    Allergies as of 12/17/2021 - Review Complete 10/19/2021  Allergen Reaction Noted   Molds & smuts Anaphylaxis 04/07/2016   Penicillins Nausea And Vomiting and Rash 03/03/2015    Family History  Problem Relation Age of Onset   Stroke Mother    Breast cancer Sister 35       breast ca x3   Colon cancer Paternal Aunt    Cancer Sister     Social History   Socioeconomic History   Marital status: Married    Spouse name: Not on file   Number of children: Not on file   Years of education: Not on file   Highest education level: Not on file  Occupational History   Not on file  Tobacco Use   Smoking status: Never   Smokeless tobacco: Never  Vaping Use   Vaping Use: Never used  Substance and Sexual Activity   Alcohol use: No    Alcohol/week: 0.0 standard drinks of alcohol   Drug use: Never   Sexual activity: Not Currently    Birth control/protection: Post-menopausal  Other Topics Concern   Not on file  Social History Narrative   Not on file   Social Determinants of Health   Financial Resource Strain: Not on file  Food Insecurity: Not on file  Transportation Needs: Not on file  Physical Activity: Not on file  Stress: Not on file  Social Connections: Not on file  Intimate Partner Violence: Not on file    Review of Systems: See HPI, otherwise negative ROS  Physical Exam: There were no vitals taken for this visit. General:   Alert,  pleasant and cooperative in NAD Head:  Normocephalic and atraumatic. Lungs:  Clear to auscultation.    Heart:  Regular rate and rhythm.   Impression/Plan: Sonya Harper is here for ophthalmic surgery.  Risks, benefits, limitations, and alternatives regarding ophthalmic surgery have been reviewed with the patient.  Questions have been answered.  All parties agreeable.   Leandrew Koyanagi, MD  02/16/2022, 8:43 AM

## 2022-02-16 NOTE — Anesthesia Procedure Notes (Signed)
Procedure Name: MAC Date/Time: 02/16/2022 9:40 AM  Performed by: Dionne Bucy, CRNAPre-anesthesia Checklist: Patient identified, Emergency Drugs available, Suction available, Patient being monitored and Timeout performed Patient Re-evaluated:Patient Re-evaluated prior to induction Oxygen Delivery Method: Nasal cannula Placement Confirmation: positive ETCO2

## 2022-02-16 NOTE — Anesthesia Preprocedure Evaluation (Signed)
Anesthesia Evaluation  Patient identified by MRN, date of birth, ID band Patient awake    Reviewed: Allergy & Precautions, NPO status   Airway Mallampati: II  TM Distance: >3 FB Neck ROM: Full    Dental no notable dental hx.    Pulmonary asthma ,    Pulmonary exam normal        Cardiovascular hypertension, Normal cardiovascular exam+ dysrhythmias Atrial Fibrillation   Last cardiology visit Jan 2023 Normal nuclear stress test in 2018, normal EF on echo in 2022   Neuro/Psych PSYCHIATRIC DISORDERS Depression  Neuromuscular disease (Fibromyalgia)    GI/Hepatic Neg liver ROS, GERD  ,  Endo/Other  Hypothyroidism BMI 30  Renal/GU negative Renal ROS     Musculoskeletal  (+) Arthritis , Fibromyalgia -  Abdominal (+) + obese,   Peds  Hematology   Anesthesia Other Findings   Reproductive/Obstetrics                             Anesthesia Physical  Anesthesia Plan  ASA: 3  Anesthesia Plan: MAC   Post-op Pain Management: Minimal or no pain anticipated   Induction: Intravenous  PONV Risk Score and Plan: 2 and TIVA, Midazolam and Treatment may vary due to age or medical condition  Airway Management Planned: Natural Airway and Nasal Cannula  Additional Equipment:   Intra-op Plan:   Post-operative Plan:   Informed Consent: I have reviewed the patients History and Physical, chart, labs and discussed the procedure including the risks, benefits and alternatives for the proposed anesthesia with the patient or authorized representative who has indicated his/her understanding and acceptance.     Dental advisory given  Plan Discussed with: CRNA  Anesthesia Plan Comments:         Anesthesia Quick Evaluation

## 2022-02-17 ENCOUNTER — Encounter: Payer: Self-pay | Admitting: Ophthalmology

## 2022-02-18 ENCOUNTER — Telehealth: Payer: Self-pay | Admitting: Internal Medicine

## 2022-02-18 NOTE — Telephone Encounter (Signed)
Pt is calling to express her gratuide from her 02/07/22 office visit. Pt reports feeling better. And reports that the medication is working.

## 2022-02-23 ENCOUNTER — Telehealth: Payer: Self-pay | Admitting: Internal Medicine

## 2022-02-23 NOTE — Telephone Encounter (Signed)
Called patient to inform her she needs an appointment for a referral to be put in. Pt verbalized understanding.

## 2022-02-23 NOTE — Telephone Encounter (Signed)
Copied from [place] [id number]. Topic: Referral - Question >> Feb 23, 2022 10:49 AM Rudene Anda wrote: Reason for CRM: Pt wanted to know if Dr Rosana Berger would send in a referral to [facility] dermatology, please advise.

## 2022-03-01 ENCOUNTER — Ambulatory Visit: Payer: Medicare Other | Admitting: Physician Assistant

## 2022-03-02 NOTE — Telephone Encounter (Signed)
Spoke with pt and informed her that I had spoken with our referral coordinator and they also stated that our name must be on her medicaid card in order for [place] to refer her out. Pt verbalized understanding but is confused because her heart dr is at [facility] but we are her primary care. I suggested that she contact medicaid to get the card switched to our name and she stated that the lady that she spoke with from [place] on yesterday [place]) said our name was on the card. But the person that I spoke with on today Jeralene Huff) stated [facility] was on her card. I again advised pt to contact medicaid so that she will not run into this issue again and she agreed.

## 2022-03-24 ENCOUNTER — Other Ambulatory Visit: Payer: Medicare Other

## 2022-04-14 ENCOUNTER — Encounter: Payer: Self-pay | Admitting: Internal Medicine

## 2022-04-14 ENCOUNTER — Other Ambulatory Visit (HOSPITAL_COMMUNITY)
Admission: RE | Admit: 2022-04-14 | Discharge: 2022-04-14 | Disposition: A | Discharge status: Home / Self Care | Payer: Medicare Other | Source: Ambulatory Visit | Attending: Internal Medicine | Admitting: Internal Medicine

## 2022-04-14 ENCOUNTER — Ambulatory Visit (INDEPENDENT_AMBULATORY_CARE_PROVIDER_SITE_OTHER): Payer: Medicare Other | Admitting: Internal Medicine

## 2022-04-14 VITALS — BP 160/84 | HR 97 | Temp 98.2°F | Resp 16 | Ht 65.0 in | Wt 183.9 lb

## 2022-04-14 DIAGNOSIS — E782 Mixed hyperlipidemia: Secondary | ICD-10-CM

## 2022-04-14 DIAGNOSIS — N898 Other specified noninflammatory disorders of vagina: Secondary | ICD-10-CM

## 2022-04-14 DIAGNOSIS — I10 Essential (primary) hypertension: Secondary | ICD-10-CM

## 2022-04-14 DIAGNOSIS — K219 Gastro-esophageal reflux disease without esophagitis: Secondary | ICD-10-CM

## 2022-04-14 DIAGNOSIS — R350 Frequency of micturition: Secondary | ICD-10-CM

## 2022-04-14 DIAGNOSIS — L989 Disorder of the skin and subcutaneous tissue, unspecified: Secondary | ICD-10-CM

## 2022-04-14 DIAGNOSIS — R319 Hematuria, unspecified: Secondary | ICD-10-CM | POA: Diagnosis not present

## 2022-04-14 DIAGNOSIS — R7303 Prediabetes: Secondary | ICD-10-CM

## 2022-04-14 DIAGNOSIS — J45901 Unspecified asthma with (acute) exacerbation: Secondary | ICD-10-CM

## 2022-04-14 LAB — POCT URINALYSIS DIPSTICK
Bilirubin, UA: NEGATIVE
Blood, UA: POSITIVE
Glucose, UA: NEGATIVE
Ketones, UA: NEGATIVE
Leukocytes, UA: NEGATIVE
Nitrite, UA: NEGATIVE
Odor: NORMAL
Protein, UA: NEGATIVE
Spec Grav, UA: 1.015 (ref 1.010–1.025)
Urobilinogen, UA: 0.2 E.U./dL
pH, UA: 6.5 (ref 5.0–8.0)

## 2022-04-14 MED ORDER — LOSARTAN POTASSIUM 50 MG PO TABS: 50.0000 mg | ORAL_TABLET | Freq: Every day | ORAL | 1 refills | Status: DC

## 2022-04-14 MED ORDER — BLOOD PRESSURE MONITOR/L CUFF MISC: 1.0000 | Freq: Every day | 0 refills | Status: AC

## 2022-04-14 NOTE — Patient Instructions (Signed)
It was great seeing you today!  Plan discussed at today's visit: -Urine with blood in it today, we will send it for culture and I will let you know the results. Be sure to drink plenty of water and stay hydrated -Losartan increased to 50 mg daily, please check blood pressure at home -Referral to Dermatology placed   Follow up in: 1 month   Take care and let us know if you have any questions or concerns prior to your next visit.  Dr. Rosana Berger

## 2022-04-14 NOTE — Progress Notes (Signed)
Established Patient Office Visit  Subjective   Patient ID: Sonya Harper, female    DOB: [birth year]  Age: 76 y.o. MRN: [id number]  Chief Complaint  Patient presents with   Referral    To derm   Urinary Tract Infection    Frequency and itching    HPI Sonya Harper is a 76 year old female here for follow up on chronic medical conditions.   URINARY SYMPTOMS Dysuria: no Urinary frequency: yes Urgency: yes Small volume voids: no Urinary incontinence: no Foul odor: yes Hematuria: no Suprapubic pain/pressure: yes Flank pain: yes, left  Fever:  no Vaginal discharge: no changes    Grief: -Patient's husband passed away from T-cell lymphoma earlier this year.  One of her daughters lives with her and is providing emotional support.  The patient states that overall she is doing okay dealing with her grief.  She was recommended free counseling services, which she is thinking about trying.  Hypertension/A.fib: -Medications: Amlodipine 5, Losartan 25, Eliquis 5 BID -Patient is compliant with above medications and reports no side effects. -Checking BP at home (average): Not checking  -Denies any acute vision changes, LE edema or symptoms of hypotension -Follows with Cardiology, note from 10/01/21 reviewed. Had been on Sotalol for rhythm control in the past, however this was discontinued as she did not have reoccurrence of symptoms.  Nuclear stress test in 2018 was unremarkable.  TEE from 3/22 unremarkable as well with a normal EF.  HLD: -Medications: Lipitor 10 mg every other day  -Patient is compliant with above medications and reports no side effects.  -Last lipid panel: Lipid Panel     Component Value Date/Time   CHOL 121 10/19/2021 1514   TRIG 57 10/19/2021 1514   HDL 52 10/19/2021 1514   CHOLHDL 2.3 10/19/2021 1514   LDLCALC 56 10/19/2021 1514   Hx of Pre-Diabetes: -Last A1c 6.0% 5/23  Asthma:  -Asthma status: controlled -Current Treatments: Breo-Ellipta 200,  increased at last office visit, albuterol PRN -Satisfied with current treatment?: no -Albuterol/rescue inhaler frequency: Occasionally -Dyspnea frequency: Occasionally -Wheezing frequency: Occasionally, not recently -Cough frequency: Daily, nonproductive -Limitation of activity: no -Current upper respiratory symptoms: no -Visits to ER or Urgent Care in past year: no -Pneumovax:  Prevnar 23 in 2018, 13 in 2016 -Influenza: Up to Date  GERD: -Currently on Protonix 40 mg BID, controls symptoms most of the time  -Had EGD 1/21, which did show gastritis, negative for dysplasia  Hypothyroidism: -Medications: Levothyroxine 50 mcg  -Patient is compliant with the above medication (s) at the above dose and reports no medication side effects.  -Denies weight changes, cold./heat intolerance, skin changes, anxiety/palpitations  -Last TSH: 0.728 8/22 -Follows with Endocrinology, note from 04/16/21 reviewed, seen on annual basis.   Health Maintenance: -Blood work up to date -Mammogram and DEXA ordered, scheduled for later this month -Colonoscopy 09/20/19 - repeat in 5 years  Review of Systems  Constitutional:  Negative for chills and fever.  Respiratory:  Negative for shortness of breath and wheezing.   Cardiovascular:  Negative for chest pain.  Gastrointestinal:  Negative for abdominal pain and heartburn.  Genitourinary:  Positive for flank pain, frequency and urgency. Negative for dysuria and hematuria.  Neurological:  Negative for headaches.      Objective:     BP (!) 160/66   Pulse 97   Temp 98.2 F (36.8 C)   Resp 16   Ht 5\' 5"  (1.651 m)   Wt 183 lb 14.4 oz (83.4 kg)  SpO2 99%   BMI 30.60 kg/m  BP Readings from Last 3 Encounters:  04/14/22 (!) 160/66  02/16/22 (!) 155/90  02/02/22 138/78   Wt Readings from Last 3 Encounters:  04/14/22 183 lb 14.4 oz (83.4 kg)  02/16/22 187 lb (84.8 kg)  02/07/22 184 lb 1.6 oz (83.5 kg)      Physical Exam Constitutional:       Appearance: Normal appearance.  HENT:     Head: Normocephalic and atraumatic.  Cardiovascular:     Rate and Rhythm: Normal rate and regular rhythm.  Pulmonary:     Effort: Pulmonary effort is normal.     Breath sounds: Normal breath sounds.  Abdominal:     Tenderness: There is left CVA tenderness. There is no right CVA tenderness.  Musculoskeletal:     Right lower leg: No edema.     Left lower leg: No edema.  Skin:    General: Skin is warm and dry.  Neurological:     General: No focal deficit present.     Mental Status: She is alert. Mental status is at baseline.  Psychiatric:        Mood and Affect: Mood normal.        Behavior: Behavior normal.      No results found for any visits on 04/14/22.   Last CBC Lab Results  Component Value Date   WBC 7.1 02/08/2021   HGB 13.4 02/08/2021   HCT 40.9 02/08/2021   MCV 88.3 02/08/2021   MCH 28.9 02/08/2021   RDW 13.4 02/08/2021   PLT 237 83/25/4982   Last metabolic panel Lab Results  Component Value Date   GLUCOSE 136 (H) 10/19/2021   NA 141 10/19/2021   K 3.9 10/19/2021   CL 106 10/19/2021   CO2 28 10/19/2021   BUN 15 10/19/2021   CREATININE 0.94 10/19/2021   EGFR 64 10/19/2021   CALCIUM 8.9 10/19/2021   PHOS 3.5 02/26/2018   PROT 6.1 10/19/2021   ALBUMIN 3.9 02/08/2021   BILITOT 0.5 10/19/2021   ALKPHOS 99 02/08/2021   AST 12 10/19/2021   ALT 8 10/19/2021   ANIONGAP 8 02/08/2021   Last lipids Lab Results  Component Value Date   CHOL 121 10/19/2021   HDL 52 10/19/2021   LDLCALC 56 10/19/2021   TRIG 57 10/19/2021   CHOLHDL 2.3 10/19/2021   Last hemoglobin A1c Lab Results  Component Value Date   HGBA1C 6.0 (A) 01/25/2022   Last thyroid functions Lab Results  Component Value Date   TSH 1.40 08/10/2017   Last vitamin D Lab Results  Component Value Date   VD25OH 24 (L) 08/30/2018   Last vitamin B12 and Folate No results found for: "VITAMINB12", "FOLATE"    The ASCVD Risk score (Arnett DK, et  al., 2019) failed to calculate for the following reasons:   The valid total cholesterol range is 130 to 320 mg/dL    Assessment & Plan:   1. Urinary frequency/Hematuria, unspecified type: UA in the office negative for signs of infection, however there was microscopic blood.  She is also having some left mild CVA tenderness.  Urine will be sent for culture, however we did discuss the possibility of a kidney stone.  If she has severe pain she will go to the ER.  Recommend she increase her oral hydration.  - POCT urinalysis dipstick - Urine Culture  2. Vaginal itching: Vaginal swab today as well, denies change in vaginal discharge. - Cervicovaginal ancillary only  3.  Benign essential HTN: Blood pressure above goal today.  Increase losartan to 50 mg daily, continue amlodipine 5 mg as well.  A blood pressure cuff was sent to her pharmacy, she will start checking her blood pressure at home and follow-up here in 1 month for blood pressure recheck.  - Blood Pressure Monitoring (BLOOD PRESSURE MONITOR/L CUFF) MISC; 1 each by Does not apply route daily.  Dispense: 1 each; Refill: 0 - losartan (COZAAR) 50 MG tablet; Take 1 tablet (50 mg total) by mouth daily.  Dispense: 30 tablet; Refill: 1  4. Skin lesion of face: Referral placed to dermatology.  - Ambulatory referral to Dermatology  5. Persistent asthma with acute exacerbation, unspecified asthma severity: Better since going up on her Breo Ellipta.  Continue albuterol as needed.  6. Hyperlipidemia, mixed: Chronic.  Continue Lipitor 10 mg daily.  7. Prediabetes: Not currently on any medication, plan to recheck at next visit.  8. Gastroesophageal reflux disease without esophagitis: Stable.  Continue pantoprazole 40 mg daily.   Return in about 4 weeks (around 05/12/2022).    Teodora Medici, DO

## 2022-04-15 LAB — URINE CULTURE
MICRO NUMBER:: 13762954
Result:: NO GROWTH
SPECIMEN QUALITY:: ADEQUATE

## 2022-04-18 ENCOUNTER — Ambulatory Visit (INDEPENDENT_AMBULATORY_CARE_PROVIDER_SITE_OTHER): Payer: Medicare Other | Admitting: Dermatology

## 2022-04-18 ENCOUNTER — Telehealth: Payer: Self-pay | Admitting: Internal Medicine

## 2022-04-18 ENCOUNTER — Encounter: Payer: Self-pay | Admitting: Dermatology

## 2022-04-18 DIAGNOSIS — L720 Epidermal cyst: Secondary | ICD-10-CM

## 2022-04-18 DIAGNOSIS — L308 Other specified dermatitis: Secondary | ICD-10-CM

## 2022-04-18 DIAGNOSIS — L810 Postinflammatory hyperpigmentation: Secondary | ICD-10-CM | POA: Diagnosis not present

## 2022-04-18 LAB — CERVICOVAGINAL ANCILLARY ONLY
Bacterial Vaginitis (gardnerella): NEGATIVE
Candida Glabrata: NEGATIVE
Candida Vaginitis: NEGATIVE
Comment: NEGATIVE
Comment: NEGATIVE
Comment: NEGATIVE
Comment: NEGATIVE
Trichomonas: NEGATIVE

## 2022-04-18 MED ORDER — MOMETASONE FUROATE 0.1 % EX CREA: TOPICAL_CREAM | CUTANEOUS | 1 refills | Status: AC

## 2022-04-18 NOTE — Telephone Encounter (Signed)
Copied from [place] [id number]. Topic: General - Other >> Apr 18, 2022  2:01 PM Chapman Fitch wrote: Reason for CRM: Pt called to find out her results in order to start a medication / pt will be available until 3pm then she has an appt / please advise

## 2022-04-18 NOTE — Progress Notes (Signed)
   New Patient Visit  Subjective  Sonya Harper is a 76 y.o. female who presents for the following: Skin Problem (Check spot on right temple area. Dur: ~3 months. Also has spots on left temple, right cheek and abdomen. C/O breaking out on legs at times. Had Rx cream in the past, does not know name). The patient has spots, moles and lesions to be evaluated, some may be new or changing and the patient has concerns that these could be cancer.  Review of Systems: No other skin or systemic complaints except as noted in HPI or Assessment and Plan.  Objective  Well appearing patient in no apparent distress; mood and affect are within normal limits.  A focused examination was performed including face. Relevant physical exam findings are noted in the Assessment and Plan.  Right Temple, right cheek, right temple 0.6 cm cystic papule, smaller 0.2-0.3 cm cystic papules at right cheek and right temple  Right Lower Leg - Anterior Hyperpigmented patches   Assessment & Plan  Epidermal inclusion cyst Right Temple, right cheek, right temple  Benign-appearing. Exam most consistent with an epidermal inclusion cyst. Discussed that a cyst is a benign growth that can grow over time and sometimes get irritated or inflamed. Recommend observation if it is not bothersome. Discussed option of surgical excision to remove it if it is growing, symptomatic, or other changes noted. Please call for new or changing lesions so they can be evaluated.  Patient will schedule surgery for lesion on right temple  Eczema of the lower legs with post-inflammatory hyperpigmentation Right Lower Leg - Anterior Chronic and persistent condition with duration or expected duration over one year. Condition is bothersome/symptomatic for patient. Currently flared. Atopic dermatitis (eczema) is a chronic, relapsing, pruritic condition that can significantly affect quality of life. It is often associated with allergic rhinitis and/or  asthma and can require treatment with topical medications, phototherapy, or in severe cases biologic injectable medication (Dupixent; Adbry) or Oral JAK inhibitors.  Start Mometasone cream once daily up to 5 days a week as needed for eczema flares.  Topical steroids (such as triamcinolone, fluocinolone, fluocinonide, mometasone, clobetasol, halobetasol, betamethasone, hydrocortisone) can cause thinning and lightening of the skin if they are used for too long in the same area. Your physician has selected the right strength medicine for your problem and area affected on the body. Please use your medication only as directed by your physician to prevent side effects.   mometasone (ELOCON) 0.1 % cream - Right Lower Leg - Anterior Apply once daily up to 5 days a week for eczema flares  Related Medications mometasone (ELOCON) 0.1 % cream Apply once daily up to 5 days a week for eczema flares  Return if symptoms worsen or fail to improve.  I, Emelia Salisbury, CMA, am acting as scribe for Sarina Ser, MD. Documentation: I have reviewed the above documentation for accuracy and completeness, and I agree with the above.  Sarina Ser, MD

## 2022-04-18 NOTE — Telephone Encounter (Signed)
Pt notified, urine culture negative if still having symptoms she will need to follow-up

## 2022-04-18 NOTE — Patient Instructions (Signed)
Start Mometasone cream once daily up to 5 days a week as needed for eczema flares.  Topical steroids (such as triamcinolone, fluocinolone, fluocinonide, mometasone, clobetasol, halobetasol, betamethasone, hydrocortisone) can cause thinning and lightening of the skin if they are used for too long in the same area. Your physician has selected the right strength medicine for your problem and area affected on the body. Please use your medication only as directed by your physician to prevent side effects.      Pre-Operative Instructions  You are scheduled for a surgical procedure at [facility]. We recommend you read the following instructions. If you have any questions or concerns, please call the office at [phone].  Shower and wash the entire body with soap and water the day of your surgery paying special attention to cleansing at and around the planned surgery site.  Avoid aspirin or aspirin containing products at least fourteen (14) days prior to your surgical procedure and for at least one week (7 Days) after your surgical procedure. If you take aspirin on a regular basis for heart disease or history of stroke or for any other reason, we may recommend you continue taking aspirin but please notify us if you take this on a regular basis. Aspirin can cause more bleeding to occur during surgery as well as prolonged bleeding and bruising after surgery.   Avoid other nonsteroidal pain medications at least one week prior to surgery and at least one week prior to your surgery. These include medications such as Ibuprofen (Motrin, Advil and Nuprin), Naprosyn, Voltaren, Relafen, etc. If medications are used for therapeutic reasons, please inform us as they can cause increased bleeding or prolonged bleeding during and bruising after surgical procedures.   Please advise [place] if you are taking any "blood thinner" medications such as Coumadin or Dipyridamole or Plavix or similar medications. These  cause increased bleeding and prolonged bleeding during procedures and bruising after surgical procedures. We may have to consider discontinuing these medications briefly prior to and shortly after your surgery if safe to do so.   Please inform us of all medications you are currently taking. All medications that are taken regularly should be taken the day of surgery as you always do. Nevertheless, we need to be informed of what medications you are taking prior to surgery to know whether they will affect the procedure or cause any complications.   Please inform us of any medication allergies. Also inform us of whether you have allergies to Latex or rubber products or whether you have had any adverse reaction to Lidocaine or Epinephrine.  Please inform us of any prosthetic or artificial body parts such as artificial heart valve, joint replacements, etc., or similar condition that might require preoperative antibiotics.   We recommend avoidance of alcohol at least two weeks prior to surgery and continued avoidance for at least two weeks after surgery.   We recommend discontinuation of tobacco smoking at least two weeks prior to surgery and continued abstinence for at least two weeks after surgery.  Do not plan strenuous exercise, strenuous work or strenuous lifting for approximately four weeks after your surgery.   We request if you are unable to make your scheduled surgical appointment, please call us at least a week in advance or as soon as you are aware of a problem so that we can cancel or reschedule the appointment.   You MAY TAKE TYLENOL (acetaminophen) for pain as it is not a blood thinner.   PLEASE PLAN TO  BE IN TOWN FOR TWO WEEKS FOLLOWING SURGERY, THIS IS IMPORTANT SO YOU CAN BE CHECKED FOR DRESSING CHANGES, SUTURE REMOVAL AND TO MONITOR FOR POSSIBLE COMPLICATIONS.   Gentle Skin Care Guide  1. Bathe no more than once a day.  2. Avoid bathing in hot water  3. Use a mild soap like Dove,  Vanicream, Cetaphil, CeraVe. Can use Lever 2000 or Cetaphil antibacterial soap  4. Use soap only where you need it. On most days, use it under your arms, between your legs, and on your feet. Let the water rinse other areas unless visibly dirty.  5. When you get out of the bath/shower, use a towel to gently blot your skin dry, don't rub it.  6. While your skin is still a little damp, apply a moisturizing cream such as Vanicream, CeraVe, Cetaphil, Eucerin, Sarna lotion or plain Vaseline Jelly. For hands apply Neutrogena [place] Hand Cream or Excipial Hand Cream.  7. Reapply moisturizer any time you start to itch or feel dry.  8. Sometimes using free and clear laundry detergents can be helpful. Fabric softener sheets should be avoided. Downy Free & Gentle liquid, or any liquid fabric softener that is free of dyes and perfumes, it acceptable to use  9. If your doctor has given you prescription creams you may apply moisturizers over them     Due to recent changes in healthcare laws, you may see results of your pathology and/or laboratory studies on MyChart before the doctors have had a chance to review them. We understand that in some cases there may be results that are confusing or concerning to you. Please understand that not all results are received at the same time and often the doctors may need to interpret multiple results in order to provide you with the best plan of care or course of treatment. Therefore, we ask that you please give [place] 2 business days to thoroughly review all your results before contacting the office for clarification. Should we see a critical lab result, you will be contacted sooner.   If You Need Anything After Your Visit  If you have any questions or concerns for your doctor, please call our main line at [phone] and press option 4 to reach your doctor's medical assistant. If no one answers, please leave a voicemail as directed and we will return your call as soon as  possible. Messages left after 4 pm will be answered the following business day.   You may also send [place] a message via [facility]. We typically respond to MyChart messages within 1-2 business days.  For prescription refills, please ask your pharmacy to contact our office. Our fax number is [phone].  If you have an urgent issue when the clinic is closed that cannot wait until the next business day, you can page your doctor at the number below.    Please note that while we do our best to be available for urgent issues outside of office hours, we are not available 24/7.   If you have an urgent issue and are unable to reach [place], you may choose to seek medical care at your doctor's office, retail clinic, urgent care center, or emergency room.  If you have a medical emergency, please immediately call 911 or go to the emergency department.  Pager Numbers  - Dr. Nehemiah Massed: [phone]  - Dr. Laurence Ferrari: [phone]  - Dr. Nicole Kindred: [phone]  In the event of inclement weather, please call our main line at [phone] for an update on the  status of any delays or closures.  Dermatology Medication Tips: Please keep the boxes that topical medications come in in order to help keep track of the instructions about where and how to use these. Pharmacies typically print the medication instructions only on the boxes and not directly on the medication tubes.   If your medication is too expensive, please contact our office at [phone] option 4 or send [place] a message through [place].   We are unable to tell what your co-pay for medications will be in advance as this is different depending on your insurance coverage. However, we may be able to find a substitute medication at lower cost or fill out paperwork to get insurance to cover a needed medication.   If a prior authorization is required to get your medication covered by your insurance company, please allow [place] 1-2 business days to complete this  process.  Drug prices often vary depending on where the prescription is filled and some pharmacies may offer cheaper prices.  The website www.goodrx.com contains coupons for medications through different pharmacies. The prices here do not account for what the cost may be with help from insurance (it may be cheaper with your insurance), but the website can give you the price if you did not use any insurance.  - You can print the associated coupon and take it with your prescription to the pharmacy.  - You may also stop by our office during regular business hours and pick up a GoodRx coupon card.  - If you need your prescription sent electronically to a different pharmacy, notify our office through [facility] or by phone at [phone] option 4.     Si Usted Necesita Algo Despus de Su Visita  Tambin puede enviarnos un mensaje a travs de Pharmacist, community. Por lo general respondemos a los mensajes de MyChart en el transcurso de 1 a 2 das hbiles.  Para renovar recetas, por favor pida a su farmacia que se ponga en contacto con nuestra oficina. Harland Dingwall de fax es [place] [phone].  Si tiene un asunto urgente cuando la clnica est cerrada y que no puede esperar hasta el siguiente da hbil, puede llamar/localizar a su doctor(a) al nmero que aparece a continuacin.   Por favor, tenga en cuenta que aunque hacemos todo lo posible para estar disponibles para asuntos urgentes fuera del horario de [place], no estamos disponibles las 24 horas del da, los 7 das de la [place].   Si tiene un problema urgente y no puede comunicarse con nosotros, puede optar por buscar atencin mdica  en el consultorio de su doctor(a), en una clnica privada, en un centro de atencin urgente o en una sala de emergencias.  Si tiene Engineering geologist, por favor llame inmediatamente al 911 o vaya a la sala de emergencias.  Nmeros de bper  - Dr. Nehemiah Massed: [phone]  - Dra. Moye: [phone]  - Dra.  Nicole Kindred: [phone]  En caso de inclemencias del [place], por favor llame a Johnsie Kindred principal al [phone] para una actualizacin sobre el [place] de cualquier retraso o cierre.  Consejos para la medicacin en dermatologa: Por favor, guarde las cajas en las que vienen los medicamentos de uso tpico para ayudarle a seguir las instrucciones sobre dnde y cmo usarlos. Las farmacias generalmente imprimen las instrucciones del medicamento slo en las cajas y no directamente en los tubos del [place].   Si su medicamento es muy caro, por favor, pngase en contacto con Zigmund Daniel llamando al [phone] y  presione la opcin 4 o envenos un mensaje a travs de MyChart.   No podemos decirle cul ser su copago por los medicamentos por adelantado ya que esto es diferente dependiendo de la cobertura de su seguro. Sin embargo, es posible que podamos encontrar un medicamento sustituto a Electrical engineer un formulario para que el seguro cubra el medicamento que se considera necesario.   Si se requiere una autorizacin previa para que su compaa de seguros [place] su medicamento, por favor permtanos de 1 a 2 das hbiles para completar este proceso.  Los precios de los medicamentos varan con frecuencia dependiendo del Environmental consultant de dnde se surte la receta y alguna farmacias pueden ofrecer precios ms baratos.  El sitio web www.goodrx.com tiene cupones para medicamentos de Airline pilot. Los precios aqu no tienen en cuenta lo que podra costar con la ayuda del seguro (puede ser ms barato con su seguro), pero el sitio web puede darle el precio si no utiliz Research scientist (physical sciences).  - Puede imprimir el cupn correspondiente y llevarlo con su receta a la farmacia.  - Tambin puede pasar por nuestra oficina durante el horario de atencin regular y Charity fundraiser una tarjeta de cupones de GoodRx.  - Si necesita que su receta se enve electrnicamente a una farmacia diferente, informe a nuestra oficina a  travs de MyChart de [place] o por telfono llamando al [phone] y presione la opcin 4.

## 2022-04-25 ENCOUNTER — Encounter: Payer: Self-pay | Admitting: Dermatology

## 2022-04-26 ENCOUNTER — Ambulatory Visit
Admission: RE | Admit: 2022-04-26 | Discharge: 2022-04-26 | Disposition: A | Discharge status: Home / Self Care | Payer: Medicare Other | Source: Ambulatory Visit | Attending: Internal Medicine | Admitting: Internal Medicine

## 2022-04-26 DIAGNOSIS — Z1231 Encounter for screening mammogram for malignant neoplasm of breast: Secondary | ICD-10-CM | POA: Insufficient documentation

## 2022-04-27 ENCOUNTER — Ambulatory Visit: Payer: 59 | Admitting: Internal Medicine

## 2022-05-05 ENCOUNTER — Other Ambulatory Visit: Payer: Self-pay | Admitting: Family Medicine

## 2022-05-05 DIAGNOSIS — J45901 Unspecified asthma with (acute) exacerbation: Secondary | ICD-10-CM

## 2022-05-05 DIAGNOSIS — J310 Chronic rhinitis: Secondary | ICD-10-CM

## 2022-05-14 ENCOUNTER — Other Ambulatory Visit: Payer: Self-pay

## 2022-05-14 ENCOUNTER — Emergency Department: Payer: Medicare Other

## 2022-05-14 ENCOUNTER — Emergency Department
Admission: EM | Admit: 2022-05-14 | Discharge: 2022-05-14 | Disposition: A | Discharge status: Home / Self Care | Payer: Medicare Other | Attending: Emergency Medicine | Admitting: Emergency Medicine

## 2022-05-14 DIAGNOSIS — E039 Hypothyroidism, unspecified: Secondary | ICD-10-CM | POA: Insufficient documentation

## 2022-05-14 DIAGNOSIS — R531 Weakness: Secondary | ICD-10-CM

## 2022-05-14 DIAGNOSIS — R059 Cough, unspecified: Secondary | ICD-10-CM | POA: Diagnosis not present

## 2022-05-14 DIAGNOSIS — R011 Cardiac murmur, unspecified: Secondary | ICD-10-CM | POA: Insufficient documentation

## 2022-05-14 DIAGNOSIS — Z7901 Long term (current) use of anticoagulants: Secondary | ICD-10-CM | POA: Insufficient documentation

## 2022-05-14 DIAGNOSIS — I10 Essential (primary) hypertension: Secondary | ICD-10-CM | POA: Insufficient documentation

## 2022-05-14 DIAGNOSIS — J45909 Unspecified asthma, uncomplicated: Secondary | ICD-10-CM | POA: Diagnosis not present

## 2022-05-14 LAB — CBC
HCT: 40 % (ref 36.0–46.0)
Hemoglobin: 12.3 g/dL (ref 12.0–15.0)
MCH: 25.4 pg — ABNORMAL LOW (ref 26.0–34.0)
MCHC: 30.8 g/dL (ref 30.0–36.0)
MCV: 82.6 fL (ref 80.0–100.0)
Platelets: 259 10*3/uL (ref 150–400)
RBC: 4.84 MIL/uL (ref 3.87–5.11)
RDW: 16.3 % — ABNORMAL HIGH (ref 11.5–15.5)
WBC: 5.8 10*3/uL (ref 4.0–10.5)
nRBC: 0 % (ref 0.0–0.2)

## 2022-05-14 LAB — URINALYSIS, ROUTINE W REFLEX MICROSCOPIC
Bilirubin Urine: NEGATIVE
Glucose, UA: NEGATIVE mg/dL
Hgb urine dipstick: NEGATIVE
Ketones, ur: NEGATIVE mg/dL
Leukocytes,Ua: NEGATIVE
Nitrite: NEGATIVE
Protein, ur: NEGATIVE mg/dL
Specific Gravity, Urine: 1.012 (ref 1.005–1.030)
pH: 5 (ref 5.0–8.0)

## 2022-05-14 LAB — BASIC METABOLIC PANEL
Anion gap: 12 (ref 5–15)
BUN: 16 mg/dL (ref 8–23)
CO2: 24 mmol/L (ref 22–32)
Calcium: 9.1 mg/dL (ref 8.9–10.3)
Chloride: 105 mmol/L (ref 98–111)
Creatinine, Ser: 1.01 mg/dL — ABNORMAL HIGH (ref 0.44–1.00)
GFR, Estimated: 58 mL/min — ABNORMAL LOW (ref >60)
Glucose, Bld: 98 mg/dL (ref 70–99)
Potassium: 4 mmol/L (ref 3.5–5.1)
Sodium: 141 mmol/L (ref 135–145)

## 2022-05-14 LAB — TSH: TSH: 1.334 u[IU]/mL (ref 0.350–4.500)

## 2022-05-14 LAB — APTT: aPTT: 35 seconds (ref 24–36)

## 2022-05-14 LAB — TROPONIN I (HIGH SENSITIVITY): Troponin I (High Sensitivity): 12 ng/L (ref <18)

## 2022-05-14 NOTE — ED Triage Notes (Signed)
Pt here with weakness that started 3 days ago. Pt denies pain, N, V, or D. Pt states she just does not feel well. Hx of a fib, on eliquis.

## 2022-05-14 NOTE — ED Provider Notes (Signed)
[facility] Provider Note    Event Date/Time   First MD Initiated Contact with Patient 05/14/22 1137     (approximate)   History   Weakness   HPI  Sonya Harper is a 76 y.o. female history of hypertension, atrial fibrillation hyperlipidemia currently on Eliquis as well as asthma hypothyroidism    Patient has been feeling fatigued more so than typical for about 1 week.  She reports last week after church she noticed that she just felt like she was a bit tired malaise and urgency.  She reports to me that she has had some difficulty with changes in her life and generally decreased energy for about 8 or 9 months now after the death of her husband  No abdominal pain.  Eating and drinking normally normal bowel movements no chest pain no trouble breathing.  She has had a little bit of a slight cough for some time now, at least for few weeks.  She reports she does not really have any shortness of breath with it though.  No headaches.  No numbness or weakness no trouble speaking no falls or injuries.  Has atrial fibrillation and takes her medications  Saw Dr. Loni Muse couple weeks ago when she had some symptoms of discomfort with urination but those have since resolved.  Reviewed primary care note from August of this year where patient was seen for concerns of urinary frequency and itching  Negative urine culture on 8/14 Per documentation from medical assistant  Physical Exam   Triage Vital Signs: ED Triage Vitals  Enc Vitals Group     BP 05/14/22 1114 (!) 155/87     Pulse Rate 05/14/22 1114 82     Resp 05/14/22 1114 18     Temp 05/14/22 1114 98.1 F (36.7 C)     Temp Source 05/14/22 1114 Oral     SpO2 05/14/22 1114 94 %     Weight 05/14/22 1115 183 lb 13.8 oz (83.4 kg)     Height 05/14/22 1115 '5\' 5"'$  (1.651 m)     Head Circumference --      Peak Flow --      Pain Score 05/14/22 1115 0     Pain Loc --      Pain Edu? --      Excl. in [place]? --     Most  recent vital signs: Vitals:   05/14/22 1230 05/14/22 1417  BP: (!) 169/78 138/70  Pulse: 81 74  Resp: 20 15  Temp:  98.1 F (36.7 C)  SpO2: 97% 100%     General: Awake, no distress.  She is very pleasant well oriented.  Normocephalic atraumatic.  Moves all extremities well with 5 out of 5 strength to commands.  Equal smile.  Strong grip strength.  No pronator drift. CV:  Good peripheral perfusion.  Rhythm except for occasional irregularity.  Very slight mild systolic murmur Resp:  Normal effort.  Clear lungs bilaterally Abd:  No distention.  Soft nontender nondistended Other:  Warm well-perfused extremities.   ED Results / Procedures / Treatments   Labs (all labs ordered are listed, but only abnormal results are displayed) Labs Reviewed  BASIC METABOLIC PANEL - Abnormal; Notable for the following components:      Result Value   Creatinine, Ser 1.01 (*)    GFR, Estimated 58 (*)    All other components within normal limits  CBC - Abnormal; Notable for the following components:   MCH 25.4 (*)  RDW 16.3 (*)    All other components within normal limits  URINALYSIS, ROUTINE W REFLEX MICROSCOPIC - Abnormal; Notable for the following components:   Color, Urine YELLOW (*)    APPearance CLEAR (*)    All other components within normal limits  APTT  TSH  CBG MONITORING, ED  TROPONIN I (HIGH SENSITIVITY)     EKG  Interpreted by me at 1110 heart rate 80 QRS 80 QTc 430 Normal sinus rhythm, occasional PVC.  Some baseline artifact.  No evidence of acute ischemia denoted.   RADIOLOGY  Chest x-ray interpreted by me as negative for acute finding   PROCEDURES:  Critical Care performed: No  Procedures   MEDICATIONS ORDERED IN ED: Medications - No data to display   IMPRESSION / MDM / [place] / ED COURSE  I reviewed the triage vital signs and the nursing notes.                              Differential diagnosis includes, but is not limited to, possible  metabolic abnormality, deconditioning, depression, hypothyroidism (reports taking her levothyroxine), etiology such as urinary tract infection possible subtle viral etiology, pneumonia given a few weeks of slight cough but no noted dyspnea or hypoxia, cardiac etiologies etc.  To this point her work-up very reassuring CBC is normal.  Metabolic panel is normal.  She has no associated abdominal pain or abdominal symptoms.  She had a troponin checked at triage that was also normal (she does not have any clinical symptoms suggestive of ACS, thus I have canceled her second troponin)  TSH within normal limits Viral testing does not seem to be indicated at this time as the patient has had a cough for at least a couple weeks, and even if positive there would be no specific antiviral therapy   Patient's presentation is most consistent with acute complicated illness / injury requiring diagnostic workup.   The patient is on the cardiac monitor to evaluate for evidence of arrhythmia and/or significant heart rate changes.  Urinalysis without infection  ----------------------------------------- 12:57 PM on 05/14/2022 ----------------------------------------- Thus her work-up quite reassuring.  I discussed with her, and it does seem to me that a significant component of her symptoms have been present for extended period of time.  She appears quite well nontoxic eating drinking no neurologic abnormalities nothing that would suggest stroke, intracranial hemorrhage etc.  No falls or injuries.  Extremely reassuring work-up to this point, some test including TSH still pending and chest x-ray.  However, if her work-up is negative I discussed with her that other considerations would be something like possibly some mild depression she could be experiencing since the loss of her husband.  She does report that since he died she has been feeling like she could be suffering a bit of a "broken heart syndrome", and I question if  perhaps there is some chronicity or mild depressive type symptoms.  I did discuss this with her and she is very agreeable to follow-up with her primary care doctor, and I think that would be very reasonable if her work-up is negative today   Patient's work-up in the ER extremely reassuring.  Family at the bedside, and normal and daughter reports that for several months is seemingly suffered with low energy and intermittent slight anxiety.  Family member concerned patient may have some mild depressive type symptoms.  Discussed with the patient, she is very agreeable with the  plan to follow-up with primary care.  At this juncture I do not see clear acute medical cause for her fatigue, and she appears quite well and nontoxic with reassuring exam.  She is agreeable to follow-up with her primary, and family taking her home.  Discussed careful return precautions which she is in agreement with  Return precautions and treatment recommendations and follow-up discussed with the patient who is agreeable with the plan.      FINAL CLINICAL IMPRESSION(S) / ED DIAGNOSES   Final diagnoses:  Generalized weakness     Rx / DC Orders   ED Discharge Orders     None        Note:  This document was prepared using Dragon voice recognition software and may include unintentional dictation errors.   Delman Kitten, MD 05/14/22 [phone]

## 2022-05-16 ENCOUNTER — Ambulatory Visit (INDEPENDENT_AMBULATORY_CARE_PROVIDER_SITE_OTHER): Payer: Medicare Other | Admitting: Internal Medicine

## 2022-05-16 ENCOUNTER — Encounter: Payer: Self-pay | Admitting: Internal Medicine

## 2022-05-16 VITALS — BP 142/82 | HR 100 | Temp 97.8°F | Resp 16 | Ht 65.0 in | Wt 178.4 lb

## 2022-05-16 DIAGNOSIS — Z23 Encounter for immunization: Secondary | ICD-10-CM | POA: Diagnosis not present

## 2022-05-16 DIAGNOSIS — I10 Essential (primary) hypertension: Secondary | ICD-10-CM | POA: Diagnosis not present

## 2022-05-16 DIAGNOSIS — G4700 Insomnia, unspecified: Secondary | ICD-10-CM | POA: Diagnosis not present

## 2022-05-16 DIAGNOSIS — F4321 Adjustment disorder with depressed mood: Secondary | ICD-10-CM

## 2022-05-16 DIAGNOSIS — E782 Mixed hyperlipidemia: Secondary | ICD-10-CM | POA: Diagnosis not present

## 2022-05-16 DIAGNOSIS — J302 Other seasonal allergic rhinitis: Secondary | ICD-10-CM

## 2022-05-16 MED ORDER — AMLODIPINE BESYLATE 5 MG PO TABS: 5.0000 mg | ORAL_TABLET | Freq: Every day | ORAL | 1 refills | Status: DC

## 2022-05-16 MED ORDER — LOSARTAN POTASSIUM 50 MG PO TABS: 50.0000 mg | ORAL_TABLET | Freq: Every day | ORAL | 1 refills | Status: DC

## 2022-05-16 MED ORDER — FLUTICASONE PROPIONATE 50 MCG/ACT NA SUSP: 2.0000 | Freq: Every day | NASAL | 6 refills | Status: AC

## 2022-05-16 MED ORDER — TRAZODONE HCL 50 MG PO TABS: 25.0000 mg | ORAL_TABLET | Freq: Every evening | ORAL | 3 refills | Status: DC | PRN

## 2022-05-16 NOTE — Patient Instructions (Addendum)
It was great seeing you today!  Plan discussed at today's visit: -Try new sleeping medication - take 0.5- 1 tablet 30 minutes before bed -Medications refilled   Follow up in: 1 month   Take care and let us know if you have any questions or concerns prior to your next visit.  Dr. Rosana Berger

## 2022-05-16 NOTE — Progress Notes (Signed)
Established Patient Office Visit  Subjective   Patient ID: Sonya Harper, female    DOB: 1946-06-16  Age: 76 y.o. MRN: [id number]  Chief Complaint  Patient presents with   Follow-up    HPI Sonya Harper is a 76 year old female here for follow up on chronic medical conditions. Was seen in the ER over the weekend for generalized weakness. Work up negative, considering depression as a cause of chronic fatigue.   FATIGUE Duration:  chronic but worse over the last few weeks  Severity: severe  Onset: gradual Context when symptoms started:   husband passed away earlier this year Depressive symptoms:  somewhat Stress/anxiety: yes Insomnia: yes  having issues both with falling and staying asleep  Wakes feeling refreshed: no Dysnea on exertion:  no Orthopnea/PND: no Chest pain: no Chronic cough: no Lower extremity edema: no Arthralgias:yes Myalgias: yes Weakness: yes Rash: no      05/16/2022    9:42 AM 04/14/2022    2:17 PM 02/07/2022   10:30 AM 01/25/2022    1:25 PM 10/19/2021    2:35 PM  Depression screen PHQ 2/9  Decreased Interest 0 0 0 0 1  Down, Depressed, Hopeless 1 0 0 0 0  PHQ - 2 Score 1 0 0 0 1  Altered sleeping 2 0 0 0 0  Tired, decreased energy 2 0 0 0 0  Change in appetite 0 0 0 0 0  Feeling bad or failure about yourself  0 0 0 0 0  Trouble concentrating 1 0 0 0 0  Moving slowly or fidgety/restless 0 0 0 0 0  Suicidal thoughts 0 0 0 0 0  PHQ-9 Score 6 0 0 0 1  Difficult doing work/chores Somewhat difficult Not difficult at all Not difficult at all Not difficult at all Not difficult at all   Grief: -Patient's husband passed away from T-cell lymphoma earlier this year.  One of her daughters lives with her and is providing emotional support.  The patient states that overall she is doing okay dealing with her grief.  She was recommended free counseling services, which she is thinking about trying.  Hypertension/A.fib: -Medications: Amlodipine 5 mg, Losartan  50 mg (increased at LOV), Eliquis 5 BID -Patient is compliant with above medications and reports no side effects. -Checking BP at home (average): Not checking  -Denies any acute vision changes, LE edema or symptoms of hypotension -Follows with Cardiology, note from 10/01/21 reviewed. Had been on Sotalol for rhythm control in the past, however this was discontinued as she did not have reoccurrence of symptoms.  Nuclear stress test in 2018 was unremarkable.  TEE from 3/22 unremarkable as well with a normal EF.  HLD: -Medications: Lipitor 10 mg every other day  -Patient is compliant with above medications and reports no side effects.  -Last lipid panel: Lipid Panel     Component Value Date/Time   CHOL 121 10/19/2021 1514   TRIG 57 10/19/2021 1514   HDL 52 10/19/2021 1514   CHOLHDL 2.3 10/19/2021 1514   LDLCALC 56 10/19/2021 1514   Hx of Pre-Diabetes: -Last A1c 6.0% 5/23  Asthma:  -Asthma status: controlled -Current Treatments: Breo-Ellipta 200, increased at last office visit, albuterol PRN -Satisfied with current treatment?: no -Albuterol/rescue inhaler frequency: Occasionally -Dyspnea frequency: Occasionally -Wheezing frequency: Occasionally, not recently -Cough frequency: Daily, nonproductive -Limitation of activity: no -Current upper respiratory symptoms: no -Visits to ER or Urgent Care in past year: no -Pneumovax:  Prevnar 23 in 2018, 13  in 2016 -Influenza: Up to Date  GERD: -Currently on Protonix 40 mg BID, controls symptoms most of the time  -Had EGD 1/21, which did show gastritis, negative for dysplasia  Hypothyroidism: -Medications: Levothyroxine 50 mcg  -Patient is compliant with the above medication (s) at the above dose and reports no medication side effects.  -Denies weight changes, cold./heat intolerance, skin changes, anxiety/palpitations  -Last TSH: 9/23 1.334 -Follows with Endocrinology, note from 04/16/21 reviewed, seen on annual basis.   Health  Maintenance: -Blood work up to date -Mammogram 8/23 Birads-2 -Colonoscopy 09/20/19 - repeat in 5 years  Review of Systems  Constitutional:  Positive for malaise/fatigue. Negative for chills and fever.  Respiratory:  Negative for shortness of breath.   Cardiovascular:  Negative for chest pain.  Neurological:  Negative for headaches.  Psychiatric/Behavioral:  The patient has insomnia.       Objective:     BP (!) 142/82   Pulse 100   Temp 97.8 F (36.6 C)   Resp 16   Ht $R'5\' 5"'vH$  (1.651 m)   Wt 178 lb 6.4 oz (80.9 kg)   SpO2 98%   BMI 29.69 kg/m  BP Readings from Last 3 Encounters:  05/16/22 (!) 142/82  05/14/22 138/70  04/14/22 (!) 160/84   Wt Readings from Last 3 Encounters:  05/16/22 178 lb 6.4 oz (80.9 kg)  05/14/22 183 lb 13.8 oz (83.4 kg)  04/14/22 183 lb 14.4 oz (83.4 kg)      Physical Exam Constitutional:      Appearance: Normal appearance.  HENT:     Head: Normocephalic and atraumatic.  Cardiovascular:     Rate and Rhythm: Normal rate and regular rhythm.  Pulmonary:     Effort: Pulmonary effort is normal.     Breath sounds: Normal breath sounds.  Musculoskeletal:     Right lower leg: No edema.     Left lower leg: No edema.  Skin:    General: Skin is warm and dry.  Neurological:     General: No focal deficit present.     Mental Status: She is alert. Mental status is at baseline.  Psychiatric:        Mood and Affect: Mood normal.        Behavior: Behavior normal.      No results found for any visits on 05/16/22.   Last CBC Lab Results  Component Value Date   WBC 5.8 05/14/2022   HGB 12.3 05/14/2022   HCT 40.0 05/14/2022   MCV 82.6 05/14/2022   MCH 25.4 (L) 05/14/2022   RDW 16.3 (H) 05/14/2022   PLT 259 33/82/5053   Last metabolic panel Lab Results  Component Value Date   GLUCOSE 98 05/14/2022   NA 141 05/14/2022   K 4.0 05/14/2022   CL 105 05/14/2022   CO2 24 05/14/2022   BUN 16 05/14/2022   CREATININE 1.01 (H) 05/14/2022   EGFR  64 10/19/2021   CALCIUM 9.1 05/14/2022   PHOS 3.5 02/26/2018   PROT 6.1 10/19/2021   ALBUMIN 3.9 02/08/2021   BILITOT 0.5 10/19/2021   ALKPHOS 99 02/08/2021   AST 12 10/19/2021   ALT 8 10/19/2021   ANIONGAP 12 05/14/2022   Last lipids Lab Results  Component Value Date   CHOL 121 10/19/2021   HDL 52 10/19/2021   LDLCALC 56 10/19/2021   TRIG 57 10/19/2021   CHOLHDL 2.3 10/19/2021   Last hemoglobin A1c Lab Results  Component Value Date   HGBA1C 6.0 (A) 01/25/2022  Last thyroid functions Lab Results  Component Value Date   TSH 1.334 05/14/2022   Last vitamin D Lab Results  Component Value Date   VD25OH 24 (L) 08/30/2018   Last vitamin B12 and Folate No results found for: "VITAMINB12", "FOLATE"    The ASCVD Risk score (Arnett DK, et al., 2019) failed to calculate for the following reasons:   The valid total cholesterol range is 130 to 320 mg/dL    Assessment & Plan:   1. Insomnia, unspecified type/Grief: Grief and insomnia definitely contributing to fatigue, will start Trazodone 50 mg at night to help with sleep. Recheck in 1 month.   - traZODone (DESYREL) 50 MG tablet; Take 0.5-1 tablets (25-50 mg total) by mouth at bedtime as needed for sleep.  Dispense: 30 tablet; Refill: 3  2. Benign essential HTN: Stable, better controlled since increasing medications. Continue Amlodipine 5 mg, Losartan 50 mg daily, refilled today.   - losartan (COZAAR) 50 MG tablet; Take 1 tablet (50 mg total) by mouth daily.  Dispense: 90 tablet; Refill: 1 - amLODipine (NORVASC) 5 MG tablet; Take 1 tablet (5 mg total) by mouth daily.  Dispense: 90 tablet; Refill: 1  3. Hyperlipidemia, mixed: Cholesterol panel from February good, taking Lipitor 10 mg every other day but sometimes forgets. Continue medication.   4. Seasonal allergies: Stable, Flonase refilled today.   - fluticasone (FLONASE) 50 MCG/ACT nasal spray; Place 2 sprays into both nostrils daily.  Dispense: 16 g; Refill: 6  5.  Need for influenza vaccination: Flu vaccine administered today.  - Flu Vaccine QUAD High Dose(Fluad)   Return in about 4 weeks (around 06/13/2022).    Teodora Medici, DO

## 2022-05-24 ENCOUNTER — Encounter: Payer: Medicare Other | Admitting: Dermatology

## 2022-06-09 ENCOUNTER — Ambulatory Visit (INDEPENDENT_AMBULATORY_CARE_PROVIDER_SITE_OTHER): Payer: Medicare Other | Admitting: Internal Medicine

## 2022-06-09 ENCOUNTER — Encounter: Payer: Self-pay | Admitting: Internal Medicine

## 2022-06-09 VITALS — BP 134/78 | HR 77 | Temp 98.4°F | Resp 16 | Ht 65.0 in | Wt 178.1 lb

## 2022-06-09 DIAGNOSIS — G4700 Insomnia, unspecified: Secondary | ICD-10-CM | POA: Diagnosis not present

## 2022-06-09 DIAGNOSIS — F4321 Adjustment disorder with depressed mood: Secondary | ICD-10-CM | POA: Diagnosis not present

## 2022-06-09 DIAGNOSIS — I10 Essential (primary) hypertension: Secondary | ICD-10-CM | POA: Diagnosis not present

## 2022-06-09 MED ORDER — TRAZODONE HCL 100 MG PO TABS: 100.0000 mg | ORAL_TABLET | Freq: Every day | ORAL | 0 refills | Status: DC

## 2022-06-09 NOTE — Progress Notes (Signed)
Established Patient Office Visit  Subjective   Patient ID: Sonya Harper, female    DOB: [birth year]  Age: 76 y.o. MRN: [id number]  Chief Complaint  Patient presents with   Follow-up   Hypertension   Hyperlipidemia   Gastroesophageal Reflux   Hypothyroidism    HPI Sonya Harper is a 76 year old female here for follow up on new medication.   MDD/Grief/Insomnia: -Patient's husband passed away from T-cell lymphoma earlier this year.  One of her daughters lives with her and is providing emotional support.  The patient states that overall she is doing okay dealing with her grief.  Today she states that her relationship with her husband prior to his passing was complicated and she is still working through some things. She was offered counseling through hospice after her husband's death and she is now ready to call and schedule an appointment with them. -Mood status: stable -Current treatment: Trazodone 50 mg started at [facility] - states she is now sleeping about 4-5 hours with the medication. Her daytime fatigue has improved and she is able to do more activities in the day. She still wakes up every few hours either to urine or because her heart is racing. She does have A. Fib and has an appointment with her cardiologist in January. She denies chest pain, palpitations or shortness of breath. -Satisfied with current treatment?: yes -Symptom severity: moderate  -Duration of current treatment : weeks  -Side effects: no Medication compliance: excellent compliance Psychotherapy/counseling: no      06/09/2022    2:43 PM 05/16/2022    9:42 AM 04/14/2022    2:17 PM 02/07/2022   10:30 AM 01/25/2022    1:25 PM  Depression screen PHQ 2/9  Decreased Interest 0 0 0 0 0  Down, Depressed, Hopeless 0 1 0 0 0  PHQ - 2 Score 0 1 0 0 0  Altered sleeping 0 2 0 0 0  Tired, decreased energy 0 2 0 0 0  Change in appetite 0 0 0 0 0  Feeling bad or failure about yourself  0 0 0 0 0  Trouble concentrating 0 1 0  0 0  Moving slowly or fidgety/restless 0 0 0 0 0  Suicidal thoughts 0 0 0 0 0  PHQ-9 Score 0 6 0 0 0  Difficult doing work/chores Not difficult at all Somewhat difficult Not difficult at all Not difficult at all Not difficult at all   Hypertension/A.fib: -Medications: Amlodipine 5 mg, Losartan 50 mg (increased at LOV), Eliquis 5 BID -Patient is compliant with above medications and reports no side effects. -Checking BP at home (average): Not checking  -Denies any acute vision changes, LE edema or symptoms of hypotension -Follows with Cardiology, note from 10/01/21 reviewed. Had been on Sotalol for rhythm control in the past, however this was discontinued as she did not have reoccurrence of symptoms.  Nuclear stress test in 2018 was unremarkable.  TEE from 3/22 unremarkable as well with a normal EF.  HLD: -Medications: Lipitor 10 mg every other day  -Patient is compliant with above medications and reports no side effects.  -Last lipid panel: Lipid Panel     Component Value Date/Time   CHOL 121 10/19/2021 1514   TRIG 57 10/19/2021 1514   HDL 52 10/19/2021 1514   CHOLHDL 2.3 10/19/2021 1514   LDLCALC 56 10/19/2021 1514   Hx of Pre-Diabetes: -Last A1c 6.0% 5/23  Asthma:  -Asthma status: controlled -Current Treatments: Breo-Ellipta 200, increased at last  office visit, albuterol PRN -Satisfied with current treatment?: no -Albuterol/rescue inhaler frequency: Occasionally -Dyspnea frequency: Occasionally -Wheezing frequency: Occasionally, not recently -Cough frequency: Daily, nonproductive -Limitation of activity: no -Current upper respiratory symptoms: no -Visits to ER or Urgent Care in past year: no -Pneumovax:  Prevnar 23 in 2018, 13 in 2016 -Influenza: Up to Date  GERD: -Currently on Protonix 40 mg BID, controls symptoms most of the time  -Had EGD 1/21, which did show gastritis, negative for dysplasia  Hypothyroidism: -Medications: Levothyroxine 50 mcg  -Patient is compliant  with the above medication (s) at the above dose and reports no medication side effects.  -Denies weight changes, cold./heat intolerance, skin changes, anxiety/palpitations  -Last TSH: 9/23 1.334 -Follows with Endocrinology, note from 04/16/21 reviewed, seen on annual basis.   Health Maintenance: -Blood work up to date -Mammogram 8/23 Birads-2 -Colonoscopy 09/20/19 - repeat in 5 years  Review of Systems  Constitutional:  Negative for chills and fever.  Respiratory:  Negative for shortness of breath.   Cardiovascular:  Negative for chest pain.  Neurological:  Negative for headaches.  Psychiatric/Behavioral:  The patient has insomnia.       Objective:     BP 134/78   Pulse 77   Temp 98.4 F (36.9 C)   Resp 16   Ht _0  (1.651 m)   Wt 178 lb 1.6 oz (80.8 kg)   SpO2 97%   BMI 29.64 kg/m  BP Readings from Last 3 Encounters:  06/09/22 134/78  05/16/22 (!) 142/82  05/14/22 138/70   Wt Readings from Last 3 Encounters:  06/09/22 178 lb 1.6 oz (80.8 kg)  05/16/22 178 lb 6.4 oz (80.9 kg)  05/14/22 183 lb 13.8 oz (83.4 kg)      Physical Exam Constitutional:      Appearance: Normal appearance.  HENT:     Head: Normocephalic and atraumatic.  Cardiovascular:     Rate and Rhythm: Normal rate and regular rhythm.  Pulmonary:     Effort: Pulmonary effort is normal.     Breath sounds: Normal breath sounds.  Skin:    General: Skin is warm and dry.  Neurological:     General: No focal deficit present.     Mental Status: She is alert. Mental status is at baseline.  Psychiatric:        Mood and Affect: Mood normal.        Behavior: Behavior normal.      No results found for any visits on 06/09/22.   Last CBC Lab Results  Component Value Date   WBC 5.8 05/14/2022   HGB 12.3 05/14/2022   HCT 40.0 05/14/2022   MCV 82.6 05/14/2022   MCH 25.4 (L) 05/14/2022   RDW 16.3 (H) 05/14/2022   PLT 259 72/53/6644   Last metabolic panel Lab Results  Component Value Date    GLUCOSE 98 05/14/2022   NA 141 05/14/2022   K 4.0 05/14/2022   CL 105 05/14/2022   CO2 24 05/14/2022   BUN 16 05/14/2022   CREATININE 1.01 (H) 05/14/2022   EGFR 64 10/19/2021   CALCIUM 9.1 05/14/2022   PHOS 3.5 02/26/2018   PROT 6.1 10/19/2021   ALBUMIN 3.9 02/08/2021   BILITOT 0.5 10/19/2021   ALKPHOS 99 02/08/2021   AST 12 10/19/2021   ALT 8 10/19/2021   ANIONGAP 12 05/14/2022   Last lipids Lab Results  Component Value Date   CHOL 121 10/19/2021   HDL 52 10/19/2021   LDLCALC 56 10/19/2021  TRIG 57 10/19/2021   CHOLHDL 2.3 10/19/2021   Last hemoglobin A1c Lab Results  Component Value Date   HGBA1C 6.0 (A) 01/25/2022   Last thyroid functions Lab Results  Component Value Date   TSH 1.334 05/14/2022   Last vitamin D Lab Results  Component Value Date   VD25OH 24 (L) 08/30/2018   Last vitamin B12 and Folate No results found for: "VITAMINB12", "FOLATE"    The ASCVD Risk score (Arnett DK, et al., 2019) failed to calculate for the following reasons:   The valid total cholesterol range is 130 to 320 mg/dL    Assessment & Plan:   1. Insomnia, unspecified type/Grief: Insomnia mildly improved with trazodone, will increase dose to 100 mg at night, this was sent to her pharmacy.  The patient is now ready to express her grief and is going to set up an appointment with hospice counseling, which I think will be very beneficial for her.  - traZODone (DESYREL) 100 MG tablet; Take 1 tablet (100 mg total) by mouth at bedtime.  Dispense: 90 tablet; Refill: 0  2. Essential HTN: Blood pressure and heart rate stable today.  The patient states sometimes she wakes up feeling that her heart is racing.  She denies chest pain, shortness of breath or palpitations when this occurs.  The patient is on a beta-blocker.  The patient has a cardiologist and will follow-up with them in January.  Recommend she decrease her liquid intake prior to bed and obtain a pulse oximeter so she can monitor  her heart rate when these episodes occur. Otherwise continue current regimen of amlodipine 5 mg and losartan 50 mg daily.   Return in about 3 months (around 09/09/2022).    Teodora Medici, DO

## 2022-06-09 NOTE — Patient Instructions (Addendum)
It was great seeing you today!  Plan discussed at today's visit: -Trazodone increased to 100 mg at night -Recommend hospice counseling  -Consider pneumonia vaccine, COVID booster  Follow up in: 3 months   Take care and let us know if you have any questions or concerns prior to your next visit.  Dr. Rosana Berger

## 2022-06-10 ENCOUNTER — Other Ambulatory Visit: Payer: Self-pay | Admitting: Family Medicine

## 2022-06-10 NOTE — Telephone Encounter (Signed)
Requested Prescriptions  Pending Prescriptions Disp Refills  . atorvastatin (LIPITOR) 10 MG tablet [Pharmacy Med Name: ATORVASTATIN 10 MG TABLET] 45 tablet 1    Sig: TAKE ONE TABLET BY MOUTH EVERY OTHER DAY     Cardiovascular:  Antilipid - Statins Failed - 06/10/2022  6:22 AM      Failed - Lipid Panel in normal range within the last 12 months    Cholesterol  Date Value Ref Range Status  10/19/2021 121 <200 mg/dL Final   LDL Cholesterol (Calc)  Date Value Ref Range Status  10/19/2021 56 mg/dL (calc) Final    Comment:    Reference range: <100 . Desirable range <100 mg/dL for primary prevention;   <70 mg/dL for patients with CHD or diabetic patients  with > or = 2 CHD risk factors. Marland Kitchen LDL-C is now calculated using the Martin-Hopkins  calculation, which is a validated novel method providing  better accuracy than the Friedewald equation in the  estimation of LDL-C.  Cresenciano Genre et al. Annamaria Helling. 9833;825(05): 2061-2068  (http://education.[facility].com/faq/FAQ164)    HDL  Date Value Ref Range Status  10/19/2021 52 > OR = 50 mg/dL Final   Triglycerides  Date Value Ref Range Status  10/19/2021 57 <150 mg/dL Final         Passed - Patient is not pregnant      Passed - Valid encounter within last 12 months    Recent Outpatient Visits          Yesterday Insomnia, unspecified type   [place], DO   3 weeks ago Insomnia, unspecified type   [facility] Teodora Medici, DO   1 month ago Benign essential HTN   [place], DO   4 months ago Persistent asthma with acute exacerbation, unspecified asthma severity   [facility] Delsa Grana, PA-C   4 months ago Benign essential HTN   [facility] Teodora Medici, DO      Future Appointments            In 3 months Teodora Medici, [facility], [facility]

## 2022-06-13 ENCOUNTER — Ambulatory Visit: Payer: Medicare Other | Admitting: Internal Medicine

## 2022-06-27 ENCOUNTER — Ambulatory Visit: Payer: Self-pay | Admitting: *Deleted

## 2022-06-27 NOTE — Telephone Encounter (Signed)
  Chief Complaint: "bulge" under abdomen above right thigh Symptoms: right foot numbness  and pain at night difficulty sleeping. Difficulty walking. No pain to "bulge" area movable .  Frequency: 3 days  Pertinent Negatives: Patient denies fever. No leg swelling .  Disposition: '[]'$ ED /'[]'$ Urgent Care (no appt availability in office) / '[x]'$ Appointment(In office/virtual)/ '[]'$  [facility]/ '[]'$ Home Care/ '[]'$ Refused Recommended Disposition /'[]'$ [place] [facility]/ '[]'$  Follow-up with PCP Additional Notes:   Appt tomorrow    Reason for Disposition  [1] Small swelling or lump AND [2] unexplained AND [3] present > 1 week  Answer Assessment - Initial Assessment Questions 1. APPEARANCE of SWELLING: "What does it look like?"     "Bulge" under stomach above right thigh  2. SIZE: "How large is the swelling?" (e.g., inches, cm; or compare to size of pinhead, tip of pen, eraser, coin, pea, grape, ping pong ball)      na 3. LOCATION: "Where is the swelling located?"     Under abd. Above right thigh 4. ONSET: "When did the swelling start?"     3 days ago  5. COLOR: "What color is it?" "Is there more than one color?"     na 6. PAIN: "Is there any pain?" If Yes, ask: "How bad is the pain?" (e.g., scale 1-10; or mild, moderate, severe)     - NONE (0): no pain   - MILD (1-3): doesn't interfere with normal activities    - MODERATE (4-7): interferes with normal activities or awakens from sleep    - SEVERE (8-10): excruciating pain, unable to do any normal activities     none 7. ITCH: "Does it itch?" If Yes, ask: "How bad is the itch?"      na 8. CAUSE: "What do you think caused the swelling?" Not sure . Husband had similar issues caused by mold 9 OTHER SYMPTOMS: "Do you have any other symptoms?" (e.g., fever)     Difficulty sleeping due to pain at night. Right foot numbness. Difficulty walking  Protocols used: Skin Lump or Localized Swelling-A-AH

## 2022-06-28 ENCOUNTER — Ambulatory Visit (INDEPENDENT_AMBULATORY_CARE_PROVIDER_SITE_OTHER): Payer: Medicare Other | Admitting: Internal Medicine

## 2022-06-28 ENCOUNTER — Encounter: Payer: Self-pay | Admitting: Internal Medicine

## 2022-06-28 VITALS — BP 144/80 | HR 63 | Temp 97.8°F | Resp 16 | Ht 65.0 in | Wt 174.1 lb

## 2022-06-28 DIAGNOSIS — R2241 Localized swelling, mass and lump, right lower limb: Secondary | ICD-10-CM | POA: Diagnosis not present

## 2022-06-28 NOTE — Telephone Encounter (Signed)
Fyi has an appt this am

## 2022-06-28 NOTE — Progress Notes (Signed)
   Acute Office Visit  Subjective:     Patient ID: Sonya Harper, female    DOB: 06/08/1946, 76 y.o.   MRN: [id number]  Chief Complaint  Patient presents with   Mass    Right groin area, tender    HPI Patient is in today for right leg bulge.  First noticed it about 3 to 4 weeks ago.  Is located on the lateral aspect of her right upper thigh.  It has not enlarged in size but it is painful to the touch and is also causing surrounding leg pain as well as numbness and tingling spreading from her thigh into her right calf.  The pain is described as a dull aching pain.  She has never had anything like this before.  Does not bilateral the pain is intermittent and worse with prolonged walking.  She has not noticed any difference with prolonged sitting, bending over, squats or increasing intra-abdominal pressure.  She denies overlying skin changes, rashes.  She denies muscle cramps or spasms.  She denies weakness. She is particularly concerned because her husband passed away from [place] and she is worried that this is an enlarged lymph node.  She denies fevers, weight changes or increased fatigue.  Review of Systems  Constitutional:  Negative for chills, fever, malaise/fatigue and weight loss.  Respiratory:  Negative for shortness of breath.   Genitourinary:  Negative for dysuria, frequency, hematuria and urgency.  Musculoskeletal:  Positive for myalgias.  Skin:  Negative for rash.       Objective:    BP (!) 144/80   Pulse 63   Temp 97.8 F (36.6 C)   Resp 16   Ht '5\' 5"'$  (1.651 m)   Wt 174 lb 1.6 oz (79 kg)   SpO2 93%   BMI 28.97 kg/m  BP Readings from Last 3 Encounters:  06/28/22 (!) 144/80  06/09/22 134/78  05/16/22 (!) 142/82   Wt Readings from Last 3 Encounters:  06/28/22 174 lb 1.6 oz (79 kg)  06/09/22 178 lb 1.6 oz (80.8 kg)  05/16/22 178 lb 6.4 oz (80.9 kg)      Physical Exam Constitutional:      Appearance: Normal appearance.  HENT:     Head: Normocephalic and  atraumatic.  Eyes:     Conjunctiva/sclera: Conjunctivae normal.  Cardiovascular:     Rate and Rhythm: Normal rate and regular rhythm.  Pulmonary:     Effort: Pulmonary effort is normal.     Breath sounds: Normal breath sounds.  Neurological:     Mental Status: She is alert.  Psychiatric:        Mood and Affect: Mood normal.        Behavior: Behavior normal.     No results found for any visits on 06/28/22.      Assessment & Plan:   1. Mass of right thigh: Physical exam findings most consistent with lipoma, however due to patient concerns we will obtain a soft tissue ultrasound for more information.  She already has a follow-up scheduled for January and will follow-up sooner as needed.  - US Soft Tissue Head/Neck (NON-THYROID); Future   Return for AMW.  Teodora Medici, DO

## 2022-07-06 ENCOUNTER — Ambulatory Visit
Admission: RE | Admit: 2022-07-06 | Discharge: 2022-07-06 | Disposition: A | Discharge status: Home / Self Care | Payer: Medicare Other | Source: Ambulatory Visit | Attending: Internal Medicine | Admitting: Internal Medicine

## 2022-07-06 DIAGNOSIS — R2241 Localized swelling, mass and lump, right lower limb: Secondary | ICD-10-CM | POA: Diagnosis present

## 2022-07-08 ENCOUNTER — Telehealth: Payer: Self-pay | Admitting: Internal Medicine

## 2022-07-08 NOTE — Telephone Encounter (Signed)
Copied from [place] [id number]. Topic: General - Inquiry >> Jul 08, 2022  4:24 PM Rosanne Ashing P wrote: Reason for CRM: Pt is calling back about the imaging result.  She has not heard anything yet  CB@  [phone]   Spoke with patient on 07/08/22 @ 4:44 PM and informed her the results were not back yet, but as soon as Dr. Rosana Berger receives them we will give a call.  Patient verbalized understanding.

## 2022-07-11 ENCOUNTER — Ambulatory Visit (INDEPENDENT_AMBULATORY_CARE_PROVIDER_SITE_OTHER): Payer: Medicare Other | Admitting: Internal Medicine

## 2022-07-11 ENCOUNTER — Encounter: Payer: Self-pay | Admitting: Internal Medicine

## 2022-07-11 ENCOUNTER — Ambulatory Visit: Payer: Self-pay | Admitting: *Deleted

## 2022-07-11 DIAGNOSIS — J069 Acute upper respiratory infection, unspecified: Secondary | ICD-10-CM | POA: Diagnosis not present

## 2022-07-11 DIAGNOSIS — R051 Acute cough: Secondary | ICD-10-CM

## 2022-07-11 MED ORDER — PROMETHAZINE-DM 6.25-15 MG/5ML PO SYRP: 5.0000 mL | ORAL_SOLUTION | Freq: Three times a day (TID) | ORAL | 1 refills | Status: DC | PRN

## 2022-07-11 NOTE — Telephone Encounter (Signed)
3rd attempt to contact patient regarding request for prednisone. Patient noted with VV via telephone with PCP today . Please advise if prednisone can be prescribed. No message left for patient to call back.

## 2022-07-11 NOTE — Telephone Encounter (Signed)
Attempted to return her call.   Left a voicemail to call back to discuss with a nurse her request for prednisone.    (Did a video visit today with Dr. Rosana Berger).

## 2022-07-11 NOTE — Telephone Encounter (Signed)
2nd attempt to contact patient to review request for prednisone. Unable to leave message. Memory full.

## 2022-07-11 NOTE — Progress Notes (Signed)
Virtual Visit via Telephone Note  I connected with Sonya Harper on 07/11/22 at 10:20 AM EST by telephone and verified that I am speaking with the correct person using two identifiers.  Location: Patient: Home Provider: [facility]   I discussed the limitations, risks, security and privacy concerns of performing an evaluation and management service by telephone and the availability of in person appointments. I also discussed with the patient that there may be a patient responsible charge related to this service. The patient expressed understanding and agreed to proceed.   History of Present Illness:  Patient is presenting over the phone for cough. She recently saw her dentist who gave her clindamycin having days an infection in one of her teeth she has been on the antibiotic now 3 days and is tolerating it well.   URI Compliant:  -Worst symptom: cough  -Fever: no -Cough: yes, mostly dry but sometimes so intense that she will vomit -Shortness of breath: yes -Wheezing: no -Chest tightness: no -Chest congestion: no -Nasal congestion: yes -Runny nose: no -Post nasal drip: yes -Sore throat: yes -Sinus pressure: yes -Headache: yes -Toothache: yes -Ear pain: no  -Ear pressure: no  -Sick contacts: no -Abdominal pain: no -Nausea: no -Vomiting: yes when she has a cough spell -Diarrhea: no -Context: fluctuating -Relief with OTC cold/cough medications: no  -Treatments attempted: Clindamycin, Flonase and regular inhalers.  Patient states that Sonya Ridgel do not work for her.     Observations/Objective:  General: well sounding, no acute distress Pulm: Occasional dry cough over the phone Neuro: Answers all questions appropriately.  Assessment and Plan:  1. Viral upper respiratory tract infection/ Acute cough: Severe dry cough. Already on an antibiotic from her dentist, of which she is on day 3 of 7.  She states she has tried [employer] before, which does not help her cough.  I  will send in cough syrup to her pharmacy to help with symptoms.  Follow-up in the clinic if symptoms worsen or fail to improve.  - promethazine-dextromethorphan (PROMETHAZINE-DM) 6.25-15 MG/5ML syrup; Take 5 mLs by mouth 3 (three) times daily as needed for cough.  Dispense: 180 mL; Refill: 1  Follow Up Instructions: follow up in person if symptoms worsen or fail to improve    I discussed the assessment and treatment plan with the patient. The patient was provided an opportunity to ask questions and all were answered. The patient agreed with the plan and demonstrated an understanding of the instructions.   The patient was advised to call back or seek an in-person evaluation if the symptoms worsen or if the condition fails to improve as anticipated.  I provided 8 minutes of non-face-to-face time during this encounter.   Teodora Medici, DO

## 2022-07-11 NOTE — Telephone Encounter (Signed)
Message from Pheba sent at 07/11/2022 11:15 AM EST  Summary: Pt requests Rx for prednisone   Pt requests Rx for prednisone. Cb# [phone]          Call History   Type Contact Phone/Fax User  07/11/2022 11:15 AM EST Phone (Incoming) Evola, Hollis T (Self) [phone] Lemmie Evens) Leonides Schanz, [place]

## 2022-07-12 ENCOUNTER — Telehealth: Payer: Self-pay

## 2022-07-12 ENCOUNTER — Ambulatory Visit: Payer: Self-pay

## 2022-07-12 NOTE — Telephone Encounter (Signed)
Pt notified, does not know if she will pick up due to not knowing would make sugars high

## 2022-07-12 NOTE — Telephone Encounter (Signed)
Reason for Disposition  Prescription request for new medicine (not a refill)  Answer Assessment - Initial Assessment Questions 1. NAME of MEDICINE: "What medicine(s) are you calling about?"     prednisone 2. QUESTION: "What is your question?" (e.g., double dose of medicine, side effect)     Pt stated she is now wheezing and infection is causing her shtma to flare 3. PRESCRIBER: "Who prescribed the medicine?" Reason: if prescribed by specialist, call should be referred to that group.     Asking PCP 4. SYMPTOMS: "Do you have any symptoms?" If Yes, ask: "What symptoms are you having?"  "How bad are the symptoms (e.g., mild, moderate, severe)     wheezing 5. PREGNANCY:  "Is there any chance that you are pregnant?" "When was your last menstrual period?"     N/a  Protocols used: Medication Question Call-A-AH

## 2022-07-12 NOTE — Telephone Encounter (Signed)
Pt asked if another test can be done to determine why there is that mass on her right thigh. Pt stated her PCP mentioned further testing but pt did not remember what testing it was.

## 2022-07-15 NOTE — Telephone Encounter (Signed)
Pt called backed asking about a 2nd scan on her leg.  She has pain and swelling in that rt leg.  CB@  [phone] after 11:30

## 2022-07-18 ENCOUNTER — Telehealth: Payer: Self-pay | Admitting: Internal Medicine

## 2022-07-18 NOTE — Telephone Encounter (Unsigned)
Copied from [place] [id number]. Topic: General - Other >> Jul 18, 2022  4:09 PM Oley Balm E wrote: Reason for CRM: Pt called and wants to know what other scan her PCP wants her to have done, she says they discussed two different scans and she has only had one thus far. Please advise

## 2022-07-25 ENCOUNTER — Telehealth: Payer: Self-pay

## 2022-07-25 NOTE — Telephone Encounter (Signed)
Patient called regarding questions about tomorrows surgery and if it would be covered by Medicaid.  Called patient back but no answer and VM full. aw

## 2022-07-26 ENCOUNTER — Telehealth: Payer: Self-pay

## 2022-07-26 ENCOUNTER — Ambulatory Visit (INDEPENDENT_AMBULATORY_CARE_PROVIDER_SITE_OTHER): Payer: Medicare Other | Admitting: Dermatology

## 2022-07-26 DIAGNOSIS — L720 Epidermal cyst: Secondary | ICD-10-CM

## 2022-07-26 DIAGNOSIS — D492 Neoplasm of unspecified behavior of bone, soft tissue, and skin: Secondary | ICD-10-CM

## 2022-07-26 MED ORDER — MUPIROCIN 2 % EX OINT: 1.0000 | TOPICAL_OINTMENT | Freq: Every day | CUTANEOUS | 1 refills | Status: AC

## 2022-07-26 NOTE — Telephone Encounter (Signed)
Pt doing fine after today's surgery./sh 

## 2022-07-26 NOTE — Progress Notes (Signed)
   Follow-Up Visit   Subjective  Sonya Harper is a 76 y.o. female who presents for the following: Cyst (Vs other of right temple - excise today).  The following portions of the chart were reviewed this encounter and updated as appropriate:   Tobacco  Allergies  Meds  Problems  Med Hx  Surg Hx  Fam Hx     Review of Systems:  No other skin or systemic complaints except as noted in HPI or Assessment and Plan.  Objective  Well appearing patient in no apparent distress; mood and affect are within normal limits.  A focused examination was performed including face. Relevant physical exam findings are noted in the Assessment and Plan.  Right Temple Cystic papule   Assessment & Plan  Neoplasm of skin Right Temple  Skin excision  Lesion length (cm):  1.1 Lesion width (cm):  1.1 Margin per side (cm):  0 Total excision diameter (cm):  1.1 Informed consent: discussed and consent obtained   Timeout: patient name, date of birth, surgical site, and procedure verified   Procedure prep:  Patient was prepped and draped in usual sterile fashion Prep type:  Isopropyl alcohol and povidone-iodine Anesthesia: the lesion was anesthetized in a standard fashion   Anesthetic:  1% lidocaine w/ epinephrine 1-100,000 buffered w/ 8.4% NaHCO3 (3cc lido w/ epi) Instrument used: #15 blade   Instrument used comment:  #15c blade Hemostasis achieved with: pressure   Hemostasis achieved with comment:  Electrocautery Outcome: patient tolerated procedure well with no complications   Post-procedure details: sterile dressing applied and wound care instructions given   Dressing type: bandage, pressure dressing and bacitracin (Mupirocin)    Skin repair Complexity:  Complex Final length (cm):  2.5 Reason for type of repair: reduce tension to allow closure, reduce the risk of dehiscence, infection, and necrosis, reduce subcutaneous dead space and avoid a hematoma, allow closure of the large defect,  preserve normal anatomy, preserve normal anatomical and functional relationships and enhance both functionality and cosmetic results   Undermining: area extensively undermined   Undermining comment:  Undermining Defect 1.1cm Subcutaneous layers (deep stitches):  Suture size:  5-0 Suture type: Vicryl (polyglactin 910)   Subcutaneous suture technique: Inverted Dermal. Fine/surface layer approximation (top stitches):  Suture size:  5-0 Suture type: nylon   Stitches: simple running   Suture removal (days):  7 Hemostasis achieved with: pressure Outcome: patient tolerated procedure well with no complications   Post-procedure details: sterile dressing applied and wound care instructions given   Dressing type: bandage, pressure dressing and bacitracin (Mupirocin)    mupirocin ointment (BACTROBAN) 2 % Apply 1 Application topically daily. Qd to excision site  Specimen 1 - Surgical pathology Differential Diagnosis: Cyst vs other Check Margins: No  Cyst vs other, excised today Start Mupirocin oint qd to excision site   Return in about 1 week (around 08/02/2022) for suture removal.  I, Othelia Pulling, RMA, am acting as scribe for Sarina Ser, MD . Documentation: I have reviewed the above documentation for accuracy and completeness, and I agree with the above.  Sarina Ser, MD

## 2022-07-26 NOTE — Patient Instructions (Signed)

## 2022-07-31 ENCOUNTER — Other Ambulatory Visit: Payer: Self-pay | Admitting: Internal Medicine

## 2022-07-31 DIAGNOSIS — K219 Gastro-esophageal reflux disease without esophagitis: Secondary | ICD-10-CM

## 2022-08-02 ENCOUNTER — Ambulatory Visit (INDEPENDENT_AMBULATORY_CARE_PROVIDER_SITE_OTHER): Payer: Medicare Other | Admitting: Dermatology

## 2022-08-02 VITALS — BP 159/89 | HR 84

## 2022-08-02 DIAGNOSIS — D492 Neoplasm of unspecified behavior of bone, soft tissue, and skin: Secondary | ICD-10-CM

## 2022-08-02 DIAGNOSIS — Z4802 Encounter for removal of sutures: Secondary | ICD-10-CM

## 2022-08-02 DIAGNOSIS — D485 Neoplasm of uncertain behavior of skin: Secondary | ICD-10-CM

## 2022-08-02 DIAGNOSIS — L821 Other seborrheic keratosis: Secondary | ICD-10-CM

## 2022-08-02 NOTE — Telephone Encounter (Signed)
Future visit in 1 month. Requested Prescriptions  Pending Prescriptions Disp Refills   pantoprazole (PROTONIX) 40 MG tablet [Pharmacy Med Name: PANTOPRAZOLE SOD DR 40 MG TAB] 90 tablet 1    Sig: TAKE 1 TABLET BY MOUTH DAILY     Gastroenterology: Proton Pump Inhibitors Passed - 07/31/2022  6:51 AM      Passed - Valid encounter within last 12 months    Recent Outpatient Visits           3 weeks ago Viral upper respiratory tract infection   [place], DO   1 month ago Mass of right thigh   [place], DO   1 month ago Insomnia, unspecified type   [place], DO   2 months ago Insomnia, unspecified type   [place], DO   3 months ago Benign essential HTN   [place], DO       Future Appointments             Today Ralene Bathe, MD [place]   In 1 month Teodora Medici, [facility]

## 2022-08-02 NOTE — Progress Notes (Signed)
   Follow-Up Visit   Subjective  Sonya Harper is a 76 y.o. female who presents for the following: Cyst vs other (R temple, 1 week f/u, pt presents for suture removal) and Nevus (Back, yrs, no symptoms).  The following portions of the chart were reviewed this encounter and updated as appropriate:   Tobacco  Allergies  Meds  Problems  Med Hx  Surg Hx  Fam Hx     Review of Systems:  No other skin or systemic complaints except as noted in HPI or Assessment and Plan.  Objective  Well appearing patient in no apparent distress; mood and affect are within normal limits.  A focused examination was performed including face. Relevant physical exam findings are noted in the Assessment and Plan.  R temple Healing excision site   Assessment & Plan   Seborrheic Keratoses - Stuck-on, waxy, tan-brown papules and/or plaques  - Benign-appearing - Discussed benign etiology and prognosis. - Observe - Call for any changes  Neoplasm of skin -status post pathology proven cyst excision R temple  Cyst healing excision site  Encounter for Removal of Sutures - Incision site at the right temple is clean, dry and intact - Wound cleansed, sutures removed, wound cleansed and steri strips applied.  - Pathology results pending, will call patient with results. - Patient advised to keep steri-strips dry until they fall off. - Scars remodel for a full year. - Once steri-strips fall off, patient can apply over-the-counter silicone scar cream each night to help with scar remodeling if desired. - Patient advised to call with any concerns or if they notice any new or changing lesions.   Related Medications mupirocin ointment (BACTROBAN) 2 % Apply 1 Application topically daily. Qd to excision site   Return if symptoms worsen or fail to improve.  I, Othelia Pulling, RMA, am acting as scribe for Sarina Ser, MD . Documentation: I have reviewed the above documentation for accuracy and completeness,  and I agree with the above.  Sarina Ser, MD

## 2022-08-02 NOTE — Patient Instructions (Signed)
Due to recent changes in healthcare laws, you may see results of your pathology and/or laboratory studies on MyChart before the doctors have had a chance to review them. We understand that in some cases there may be results that are confusing or concerning to you. Please understand that not all results are received at the same time and often the doctors may need to interpret multiple results in order to provide you with the best plan of care or course of treatment. Therefore, we ask that you please give us 2 business days to thoroughly review all your results before contacting the office for clarification. Should we see a critical lab result, you will be contacted sooner.   If You Need Anything After Your Visit  If you have any questions or concerns for your doctor, please call our main line at [phone] and press option 4 to reach your doctor's medical assistant. If no one answers, please leave a voicemail as directed and we will return your call as soon as possible. Messages left after 4 pm will be answered the following business day.   You may also send us a message via MyChart. We typically respond to MyChart messages within 1-2 business days.  For prescription refills, please ask your pharmacy to contact our office. Our fax number is [phone].  If you have an urgent issue when the clinic is closed that cannot wait until the next business day, you can page your doctor at the number below.    Please note that while we do our best to be available for urgent issues outside of office hours, we are not available 24/7.   If you have an urgent issue and are unable to reach us, you may choose to seek medical care at your doctor's office, retail clinic, urgent care center, or emergency room.  If you have a medical emergency, please immediately call 911 or go to the emergency department.  Pager Numbers  - Dr. Kowalski: [phone]  - Dr. Moye: [phone]  - Dr. Stewart:  [phone]  In the event of inclement weather, please call our main line at [phone] for an update on the status of any delays or closures.  Dermatology Medication Tips: Please keep the boxes that topical medications come in in order to help keep track of the instructions about where and how to use these. Pharmacies typically print the medication instructions only on the boxes and not directly on the medication tubes.   If your medication is too expensive, please contact our office at [phone] option 4 or send us a message through MyChart.   We are unable to tell what your co-pay for medications will be in advance as this is different depending on your insurance coverage. However, we may be able to find a substitute medication at lower cost or fill out paperwork to get insurance to cover a needed medication.   If a prior authorization is required to get your medication covered by your insurance company, please allow us 1-2 business days to complete this process.  Drug prices often vary depending on where the prescription is filled and some pharmacies may offer cheaper prices.  The website www.goodrx.com contains coupons for medications through different pharmacies. The prices here do not account for what the cost may be with help from insurance (it may be cheaper with your insurance), but the website can give you the price if you did not use any insurance.  - You can print the associated coupon and take it with   your prescription to the pharmacy.  - You may also stop by our office during regular business hours and pick up a GoodRx coupon card.  - If you need your prescription sent electronically to a different pharmacy, notify our office through [facility] MyChart or by phone at [phone] option 4.     Si Usted Necesita Algo Despus de Su Visita  Tambin puede enviarnos un mensaje a travs de MyChart. Por lo general respondemos a los mensajes de MyChart en el transcurso de 1 a 2  das hbiles.  Para renovar recetas, por favor pida a su farmacia que se ponga en contacto con nuestra oficina. Nuestro nmero de fax es el [phone].  Si tiene un asunto urgente cuando la clnica est cerrada y que no puede esperar hasta el siguiente da hbil, puede llamar/localizar a su doctor(a) al nmero que aparece a continuacin.   Por favor, tenga en cuenta que aunque hacemos todo lo posible para estar disponibles para asuntos urgentes fuera del horario de oficina, no estamos disponibles las 24 horas del da, los 7 das de la semana.   Si tiene un problema urgente y no puede comunicarse con nosotros, puede optar por buscar atencin mdica  en el consultorio de su doctor(a), en una clnica privada, en un centro de atencin urgente o en una sala de emergencias.  Si tiene una emergencia mdica, por favor llame inmediatamente al 911 o vaya a la sala de emergencias.  Nmeros de bper  - Dr. Kowalski: [phone]  - Dra. Moye: [phone]  - Dra. Stewart: [phone]  En caso de inclemencias del tiempo, por favor llame a nuestra lnea principal al [phone] para una actualizacin sobre el estado de cualquier retraso o cierre.  Consejos para la medicacin en dermatologa: Por favor, guarde las cajas en las que vienen los medicamentos de uso tpico para ayudarle a seguir las instrucciones sobre dnde y cmo usarlos. Las farmacias generalmente imprimen las instrucciones del medicamento slo en las cajas y no directamente en los tubos del medicamento.   Si su medicamento es muy caro, por favor, pngase en contacto con nuestra oficina llamando al [phone] y presione la opcin 4 o envenos un mensaje a travs de MyChart.   No podemos decirle cul ser su copago por los medicamentos por adelantado ya que esto es diferente dependiendo de la cobertura de su seguro. Sin embargo, es posible que podamos encontrar un medicamento sustituto a menor costo o llenar un formulario para que el  seguro cubra el medicamento que se considera necesario.   Si se requiere una autorizacin previa para que su compaa de seguros cubra su medicamento, por favor permtanos de 1 a 2 das hbiles para completar este proceso.  Los precios de los medicamentos varan con frecuencia dependiendo del lugar de dnde se surte la receta y alguna farmacias pueden ofrecer precios ms baratos.  El sitio web www.goodrx.com tiene cupones para medicamentos de diferentes farmacias. Los precios aqu no tienen en cuenta lo que podra costar con la ayuda del seguro (puede ser ms barato con su seguro), pero el sitio web puede darle el precio si no utiliz ningn seguro.  - Puede imprimir el cupn correspondiente y llevarlo con su receta a la farmacia.  - Tambin puede pasar por nuestra oficina durante el horario de atencin regular y recoger una tarjeta de cupones de GoodRx.  - Si necesita que su receta se enve electrnicamente a una farmacia diferente, informe a nuestra oficina a travs de MyChart de [place]   o por telfono llamando al [phone] y presione la opcin 4.  

## 2022-08-04 ENCOUNTER — Telehealth: Payer: Self-pay

## 2022-08-04 NOTE — Telephone Encounter (Signed)
Left pt msg to call for bx result/sh °

## 2022-08-04 NOTE — Telephone Encounter (Signed)
Patient advised. aw

## 2022-08-04 NOTE — Telephone Encounter (Signed)
-----   Message from Ralene Bathe, MD sent at 08/03/2022  6:16 PM EST ----- Diagnosis Skin (M), right temple EPIDERMAL INCLUSION CYST  Benign cyst

## 2022-08-08 NOTE — Telephone Encounter (Signed)
Lvm for pt to return call to schedule an acute visit with Dr Rosana Berger

## 2022-08-08 NOTE — Telephone Encounter (Signed)
Patient called back and stated that she does not want to wait until her follow up appointment in January to discuss additional testing. Patient is concerned about cancer and leg is painful and keeping her up at night. Please follow up with patient.

## 2022-08-10 ENCOUNTER — Ambulatory Visit (INDEPENDENT_AMBULATORY_CARE_PROVIDER_SITE_OTHER): Payer: Medicare Other | Admitting: Internal Medicine

## 2022-08-10 ENCOUNTER — Encounter: Payer: Self-pay | Admitting: Internal Medicine

## 2022-08-10 VITALS — BP 142/84 | HR 91 | Temp 98.6°F | Resp 16 | Ht 65.0 in | Wt 170.4 lb

## 2022-08-10 DIAGNOSIS — R200 Anesthesia of skin: Secondary | ICD-10-CM

## 2022-08-10 DIAGNOSIS — R2241 Localized swelling, mass and lump, right lower limb: Secondary | ICD-10-CM | POA: Diagnosis not present

## 2022-08-10 DIAGNOSIS — R202 Paresthesia of skin: Secondary | ICD-10-CM

## 2022-08-10 NOTE — Progress Notes (Signed)
Acute Office Visit  Subjective:     Patient ID: Sonya Harper, female    DOB: [birth year], 76 y.o.   MRN: [id number]  Chief Complaint  Patient presents with   Follow-up    HPI Patient is in today for right leg bulge.  First noticed it the middle of October.  Is located on the lateral aspect of her right upper thigh. She was first seen for this 06/28/22 and an [place] was ordered which ultimately was negative. Patient state that she feels like it is enlarging in size and pushing on surrounding structures. She is having worsening numbness and tingling shooting from her thigh into her right calf.  The pain is described as a dull aching pain.  The pain is intermittent and worse with prolonged walking and at night. It disrupts her sleep and keeps her awake at night due to pain.  She denies weakness. She is particularly concerned because her husband passed away from [place] and she is worried that this is an enlarged lymph node.  She denies fevers, weight changes or increased fatigue.   Review of Systems  Constitutional:  Negative for chills, fever, malaise/fatigue and weight loss.  Respiratory:  Negative for shortness of breath.   Genitourinary:  Negative for dysuria, frequency, hematuria and urgency.  Musculoskeletal:  Positive for myalgias.  Skin:  Negative for rash.       Objective:    BP (!) 142/84   Pulse 91   Temp 98.6 F (37 C)   Resp 16   Ht '5\' 5"'$  (1.651 m)   Wt 170 lb 6.4 oz (77.3 kg)   SpO2 96%   BMI 28.36 kg/m  BP Readings from Last 3 Encounters:  08/10/22 (!) 142/84  08/02/22 (!) 159/89  06/28/22 (!) 144/80   Wt Readings from Last 3 Encounters:  08/10/22 170 lb 6.4 oz (77.3 kg)  06/28/22 174 lb 1.6 oz (79 kg)  06/09/22 178 lb 1.6 oz (80.8 kg)      Physical Exam Constitutional:      Appearance: Normal appearance.  HENT:     Head: Normocephalic and atraumatic.  Eyes:     Conjunctiva/sclera: Conjunctivae normal.  Cardiovascular:     Rate and Rhythm:  Normal rate and regular rhythm.  Pulmonary:     Effort: Pulmonary effort is normal.     Breath sounds: Normal breath sounds.  Musculoskeletal:        General: No swelling or tenderness. Normal range of motion.  Skin:    General: Skin is warm and dry.     Comments: Soft tissue mass palpated lateral upper right thigh. Soft and freely mobile, non-tender to palpation  Neurological:     General: No focal deficit present.     Mental Status: She is alert. Mental status is at baseline.  Psychiatric:        Mood and Affect: Mood normal.        Behavior: Behavior normal.     No results found for any visits on 08/10/22.      Assessment & Plan:   1. Mass of right thigh/Numbness and tingling of right lower extremity: Soft tissue mass still present, patient feels like it has increased size and is becoming more painful with associated numbness and tingling. Patient and her family concerned about lymphoma but [place] was negative. Will pursue MRI due to progression of symptoms.   - MR FEMUR RIGHT W CONTRAST; Future  Return for already scheduled, AWV.  Teodora Medici, DO

## 2022-08-14 ENCOUNTER — Encounter: Payer: Self-pay | Admitting: Dermatology

## 2022-08-15 ENCOUNTER — Other Ambulatory Visit: Payer: Self-pay | Admitting: Internal Medicine

## 2022-08-15 DIAGNOSIS — R2241 Localized swelling, mass and lump, right lower limb: Secondary | ICD-10-CM

## 2022-08-15 DIAGNOSIS — R200 Anesthesia of skin: Secondary | ICD-10-CM

## 2022-08-17 ENCOUNTER — Ambulatory Visit
Admission: RE | Admit: 2022-08-17 | Discharge: 2022-08-17 | Disposition: A | Discharge status: Home / Self Care | Payer: Medicare Other | Source: Ambulatory Visit | Attending: Internal Medicine | Admitting: Internal Medicine

## 2022-08-17 DIAGNOSIS — R200 Anesthesia of skin: Secondary | ICD-10-CM | POA: Insufficient documentation

## 2022-08-17 DIAGNOSIS — R202 Paresthesia of skin: Secondary | ICD-10-CM | POA: Insufficient documentation

## 2022-08-17 DIAGNOSIS — R2241 Localized swelling, mass and lump, right lower limb: Secondary | ICD-10-CM | POA: Diagnosis not present

## 2022-08-17 MED ORDER — GADOBUTROL 1 MMOL/ML IV SOLN
7.0000 mL | Freq: Once | INTRAVENOUS | Status: AC | PRN
  Administered 2022-08-17: 7 mL via INTRAVENOUS

## 2022-09-06 ENCOUNTER — Other Ambulatory Visit: Payer: Self-pay | Admitting: Internal Medicine

## 2022-09-06 DIAGNOSIS — G4700 Insomnia, unspecified: Secondary | ICD-10-CM

## 2022-09-07 NOTE — Telephone Encounter (Signed)
Requested Prescriptions  Pending Prescriptions Disp Refills   traZODone (DESYREL) 100 MG tablet [Pharmacy Med Name: traZODone 100 MG TABLET] 90 tablet 0    Sig: TAKE 1 TABLET BY MOUTH AT BEDTIME     Psychiatry: Antidepressants - Serotonin Modulator Passed - 09/06/2022  6:21 AM      Passed - Valid encounter within last 6 months    Recent Outpatient Visits           4 weeks ago Mass of right thigh   [place], DO   1 month ago Viral upper respiratory tract infection   [place], DO   2 months ago Mass of right thigh   [place], DO   3 months ago Insomnia, unspecified type   [place], DO   3 months ago Insomnia, unspecified type   [facility] Teodora Medici, [place]

## 2022-09-08 ENCOUNTER — Ambulatory Visit: Payer: Medicare Other | Admitting: Dermatology

## 2022-09-13 ENCOUNTER — Ambulatory Visit: Payer: Medicare Other | Admitting: Internal Medicine

## 2022-09-22 DIAGNOSIS — R350 Frequency of micturition: Secondary | ICD-10-CM | POA: Diagnosis not present

## 2022-09-22 DIAGNOSIS — R300 Dysuria: Secondary | ICD-10-CM | POA: Diagnosis not present

## 2022-10-11 DIAGNOSIS — Z0100 Encounter for examination of eyes and vision without abnormal findings: Secondary | ICD-10-CM | POA: Diagnosis not present

## 2022-10-17 DIAGNOSIS — I480 Paroxysmal atrial fibrillation: Secondary | ICD-10-CM | POA: Diagnosis not present

## 2022-10-17 DIAGNOSIS — R002 Palpitations: Secondary | ICD-10-CM | POA: Diagnosis not present

## 2022-10-18 ENCOUNTER — Ambulatory Visit: Payer: Medicare Other | Admitting: Internal Medicine

## 2022-11-01 ENCOUNTER — Telehealth: Payer: Self-pay | Admitting: Internal Medicine

## 2022-11-01 ENCOUNTER — Other Ambulatory Visit: Payer: Self-pay

## 2022-11-01 DIAGNOSIS — J45901 Unspecified asthma with (acute) exacerbation: Secondary | ICD-10-CM

## 2022-11-01 DIAGNOSIS — J329 Chronic sinusitis, unspecified: Secondary | ICD-10-CM

## 2022-11-01 MED ORDER — MONTELUKAST SODIUM 10 MG PO TABS: 10.0000 mg | ORAL_TABLET | Freq: Every day | ORAL | 1 refills | Status: DC

## 2022-11-01 NOTE — Telephone Encounter (Signed)
Copied from [place] [id number]. Topic: General - Inquiry >> Nov 01, 2022  2:27 PM Sonya Harper P wrote: Reason for CRM: pt called saying her [facility] sent her a heart monitor and she does not know how to pt it on.  She is wondering if she comes in if someone can help her.  CB#  [phone]

## 2022-11-01 NOTE — Telephone Encounter (Signed)
Told patient she would need to contact her cardiology office for help

## 2022-11-02 DIAGNOSIS — I480 Paroxysmal atrial fibrillation: Secondary | ICD-10-CM | POA: Diagnosis not present

## 2022-11-02 DIAGNOSIS — R002 Palpitations: Secondary | ICD-10-CM | POA: Diagnosis not present

## 2022-11-11 NOTE — Progress Notes (Unsigned)
   Acute Office Visit  Subjective:     Patient ID: Sonya Harper, female    DOB: 12/09/1945, 77 y.o.   MRN: [id number]  No chief complaint on file.   HPI Patient is in today for abdominal discomfort.   Abdominal Complaint: -Duration: {Blank single:19197::"chronic","days","weeks","months"} -Frequency: {Blank single:19197::"constant","intermittent","occasional","rare","every few minutes","a few times a hour","a few times a day","a few times a week","a few times a month","a few times a year"} -Nature: {Blank multiple:19196::"bloating","sharp","dull","aching","burning","cramping","ill-defined","itchy","pressure-like","pulling","shooting","sore","stabbing","tender","tearing","throbbing"} -Location: {Blank multiple:19196::"diffuse","vague","LUQ","RUQ","epigastric","peri-umbilical","LLQ","RLQ","diffuse","suprapubic". "lower abdominal quadrants"}  -Severity: {Blank single:19197::"mild","moderate","severe","1/10","2/10","3/10","4/10","5/10","6/10","7/10","8/10","9/10","10/10"}  -Radiation: {Blank single:19197::"yes","no"} -Alleviating factors: *** -Aggravating factors: *** -Treatments attempted: {Blank multiple:19196::"none","antacids","PPI","H2 Blocker","laxatives"} -Constipation: {Blank single:19197::"yes","no","intermittent"} -Diarrhea: {Blank single:19197::"yes","no"} -Episodes of diarrhea/day: -Mucous in the stool: {Blank single:19197::"yes","no"} -Heartburn: {Blank single:19197::"yes","no"} -Bloating:{Blank single:19197::"yes","no"} -Passing Gas: {Blank single:19197::"yes","no"} -Nausea: {Blank single:19197::"yes","no"} -Vomiting: {Blank single:19197::"yes","no"} -Episodes of vomit/day: -Melena or hematochezia: {Blank single:19197::"yes","no"} -Rash: {Blank single:19197::"yes","no"} -Jaundice: {Blank single:19197::"yes","no"} -Fever: {Blank single:19197::"yes","no"} -Weight loss: {Blank single:19197::"yes","no"} -Change in Appetite: {Blank  single:19197::"yes","no"}   ROS      Objective:    There were no vitals taken for this visit. {Vitals History (Optional):23777}  Physical Exam  No results found for any visits on 11/14/22.      Assessment & Plan:   Problem List Items Addressed This Visit   None   No orders of the defined types were placed in this encounter.   No follow-ups on file.  Teodora Medici, DO

## 2022-11-13 DIAGNOSIS — I480 Paroxysmal atrial fibrillation: Secondary | ICD-10-CM | POA: Diagnosis not present

## 2022-11-13 DIAGNOSIS — R002 Palpitations: Secondary | ICD-10-CM | POA: Diagnosis not present

## 2022-11-14 ENCOUNTER — Ambulatory Visit (INDEPENDENT_AMBULATORY_CARE_PROVIDER_SITE_OTHER): Payer: Medicare HMO | Admitting: Internal Medicine

## 2022-11-14 ENCOUNTER — Other Ambulatory Visit (HOSPITAL_COMMUNITY)
Admission: RE | Admit: 2022-11-14 | Discharge: 2022-11-14 | Disposition: A | Discharge status: Home / Self Care | Payer: Medicare HMO | Source: Ambulatory Visit | Attending: Internal Medicine | Admitting: Internal Medicine

## 2022-11-14 ENCOUNTER — Encounter: Payer: Self-pay | Admitting: Internal Medicine

## 2022-11-14 VITALS — BP 124/70 | HR 100 | Temp 98.2°F | Resp 16 | Ht 65.0 in | Wt 162.2 lb

## 2022-11-14 DIAGNOSIS — R634 Abnormal weight loss: Secondary | ICD-10-CM | POA: Diagnosis not present

## 2022-11-14 DIAGNOSIS — R1030 Lower abdominal pain, unspecified: Secondary | ICD-10-CM

## 2022-11-14 DIAGNOSIS — R8290 Unspecified abnormal findings in urine: Secondary | ICD-10-CM

## 2022-11-14 DIAGNOSIS — N949 Unspecified condition associated with female genital organs and menstrual cycle: Secondary | ICD-10-CM

## 2022-11-14 LAB — POCT URINALYSIS DIPSTICK
Bilirubin, UA: NEGATIVE
Blood, UA: NEGATIVE
Glucose, UA: NEGATIVE
Ketones, UA: NEGATIVE
Leukocytes, UA: NEGATIVE
Nitrite, UA: NEGATIVE
Odor: NORMAL
Protein, UA: NEGATIVE
Spec Grav, UA: 1.015 (ref 1.010–1.025)
Urobilinogen, UA: 0.2 E.U./dL
pH, UA: 5.5 (ref 5.0–8.0)

## 2022-11-15 LAB — CERVICOVAGINAL ANCILLARY ONLY
Bacterial Vaginitis (gardnerella): NEGATIVE
Candida Glabrata: NEGATIVE
Candida Vaginitis: NEGATIVE
Comment: NEGATIVE
Comment: NEGATIVE
Comment: NEGATIVE

## 2022-11-16 ENCOUNTER — Other Ambulatory Visit: Payer: Self-pay | Admitting: Internal Medicine

## 2022-11-17 NOTE — Telephone Encounter (Signed)
Requested medication (s) are due for refill today: expired medication  Requested medication (s) are on the active medication list: yes  Last refill:  10/05/21 #2 each 1 refills  Future visit scheduled: yes in 4 days   Notes to clinic:  expired medication . Do you want to renew Rx?     Requested Prescriptions  Pending Prescriptions Disp Refills   EPINEPHRINE 0.3 mg/0.3 mL IJ SOAJ injection [Pharmacy Med Name: EPINEPHRINE 0.3 MG AUTO-INJECT 2PK] 2 each 1    Sig: INJECT INTO THE MIDDLE OF THE OUTER THIGH AND HOLD FOR 10 SECONDS AS NEEDED FOR SEVERE ALLERGIC REACTION THEN CALL 911 IF USED     Immunology: Antidotes Passed - 11/16/2022 11:13 AM      Passed - Valid encounter within last 12 months    Recent Outpatient Visits           3 days ago Abnormal urine odor   [place], DO   3 months ago Mass of right thigh   [facility] Teodora Medici, DO   4 months ago Viral upper respiratory tract infection   [facility] Teodora Medici, DO   4 months ago Mass of right thigh   [facility] Teodora Medici, DO   5 months ago Insomnia, unspecified type   [facility] Teodora Medici, DO       Future Appointments             In 4 days Teodora Medici, [facility], [place]   In 3 weeks Teodora Medici, [facility], [facility]

## 2022-11-20 NOTE — Progress Notes (Unsigned)
   Acute Office Visit  Subjective:     Patient ID: Sonya Harper, female    DOB: [birth year], 77 y.o.   MRN: [id number]  No chief complaint on file.   HPI Patient is in today for continued abdominal discomfort and dysuria going on for about 1 week. Patient concerned because her colonoscopy in 2021 showed diverticulosis.   Abdominal Complaint: -Duration:  1 week -Frequency: intermittent -Nature: bloating -Location: vague and suprapubic". "lower abdominal quadrants   -Radiation: no -Treatments attempted: PPI -Constipation: intermittent -Diarrhea: no -Heartburn: no -Bloating:yes -Passing Gas: yes -Nausea: no -Vomiting: no -Melena or hematochezia: no -Fever: no -Weight loss: yes -12 pounds since October  -Change in Appetite: no -Does have some dysuria but denies increased urinary urgency/frequency. Denies hematuria or changes in vaginal discharge but does endorse foul odor associated with urine.   Review of Systems  Constitutional:  Negative for chills and fever.  Gastrointestinal:  Positive for abdominal pain and constipation. Negative for blood in stool, diarrhea, heartburn, melena, nausea and vomiting.  Genitourinary:  Positive for dysuria. Negative for flank pain, frequency, hematuria and urgency.        Objective:    There were no vitals taken for this visit. BP Readings from Last 3 Encounters:  11/14/22 124/70  08/10/22 (!) 142/84  08/02/22 (!) 159/89   Wt Readings from Last 3 Encounters:  11/14/22 162 lb 3.2 oz (73.6 kg)  08/10/22 170 lb 6.4 oz (77.3 kg)  06/28/22 174 lb 1.6 oz (79 kg)      Physical Exam Constitutional:      Appearance: Normal appearance.  HENT:     Head: Normocephalic and atraumatic.  Eyes:     Conjunctiva/sclera: Conjunctivae normal.  Cardiovascular:     Rate and Rhythm: Normal rate and regular rhythm.  Pulmonary:     Effort: Pulmonary effort is normal.     Breath sounds: Normal breath sounds.  Abdominal:     General: Bowel  sounds are normal. There is no distension.     Palpations: Abdomen is soft.     Tenderness: There is abdominal tenderness. There is no guarding or rebound.     Comments: Vague/diffuse abdominal pain to palpation   Skin:    General: Skin is warm and dry.  Neurological:     General: No focal deficit present.     Mental Status: She is alert. Mental status is at baseline.  Psychiatric:        Mood and Affect: Mood normal.        Behavior: Behavior normal.     No results found for any visits on 11/21/22.       Assessment & Plan:   1. Abnormal urine odor/Lower abdominal pain/Vaginal discomfort/Weight loss, unintentional: Urine negative for infection/blood here today. Cervical swab pending. Abdominal exam with diffuse/vague pain. Patient feels bloated and has lost about 12 pounds in 5 months unintentionally. Patient will follow up here in 1 month for recheck and regular medical follow up, consider pelvic US if symptoms continue.   - POCT urinalysis dipstick - Cervicovaginal ancillary only   No follow-ups on file.  Teodora Medici, DO

## 2022-11-21 ENCOUNTER — Encounter: Payer: Self-pay | Admitting: Internal Medicine

## 2022-11-21 ENCOUNTER — Ambulatory Visit (INDEPENDENT_AMBULATORY_CARE_PROVIDER_SITE_OTHER): Payer: Medicare HMO | Admitting: Internal Medicine

## 2022-11-21 VITALS — BP 132/80 | HR 91 | Temp 98.2°F | Resp 16 | Ht 65.0 in | Wt 165.4 lb

## 2022-11-21 DIAGNOSIS — J4530 Mild persistent asthma, uncomplicated: Secondary | ICD-10-CM

## 2022-11-21 DIAGNOSIS — R1084 Generalized abdominal pain: Secondary | ICD-10-CM | POA: Diagnosis not present

## 2022-11-21 DIAGNOSIS — J302 Other seasonal allergic rhinitis: Secondary | ICD-10-CM

## 2022-11-21 DIAGNOSIS — R634 Abnormal weight loss: Secondary | ICD-10-CM

## 2022-11-21 DIAGNOSIS — E782 Mixed hyperlipidemia: Secondary | ICD-10-CM | POA: Diagnosis not present

## 2022-11-21 DIAGNOSIS — I10 Essential (primary) hypertension: Secondary | ICD-10-CM | POA: Diagnosis not present

## 2022-11-21 MED ORDER — FLUTICASONE FUROATE-VILANTEROL 200-25 MCG/ACT IN AEPB: 1.0000 | INHALATION_SPRAY | Freq: Every day | RESPIRATORY_TRACT | 11 refills | Status: DC

## 2022-11-21 MED ORDER — ATORVASTATIN CALCIUM 10 MG PO TABS: 10.0000 mg | ORAL_TABLET | ORAL | 1 refills | Status: DC

## 2022-11-21 MED ORDER — AMLODIPINE BESYLATE 5 MG PO TABS: 5.0000 mg | ORAL_TABLET | Freq: Every day | ORAL | 1 refills | Status: DC

## 2022-11-21 MED ORDER — LORATADINE 10 MG PO TABS: 10.0000 mg | ORAL_TABLET | Freq: Every day | ORAL | 11 refills | Status: DC

## 2022-11-21 MED ORDER — LOSARTAN POTASSIUM 50 MG PO TABS: 50.0000 mg | ORAL_TABLET | Freq: Every day | ORAL | 1 refills | Status: DC

## 2022-11-30 ENCOUNTER — Ambulatory Visit
Admission: RE | Admit: 2022-11-30 | Discharge: 2022-11-30 | Disposition: A | Discharge status: Home / Self Care | Payer: Medicare HMO | Source: Ambulatory Visit | Attending: Internal Medicine | Admitting: Internal Medicine

## 2022-11-30 DIAGNOSIS — R634 Abnormal weight loss: Secondary | ICD-10-CM | POA: Insufficient documentation

## 2022-11-30 DIAGNOSIS — R1084 Generalized abdominal pain: Secondary | ICD-10-CM

## 2022-11-30 DIAGNOSIS — R1013 Epigastric pain: Secondary | ICD-10-CM | POA: Diagnosis not present

## 2022-12-02 ENCOUNTER — Other Ambulatory Visit: Payer: Self-pay

## 2022-12-02 DIAGNOSIS — R1084 Generalized abdominal pain: Secondary | ICD-10-CM

## 2022-12-02 DIAGNOSIS — Z13820 Encounter for screening for osteoporosis: Secondary | ICD-10-CM

## 2022-12-03 ENCOUNTER — Other Ambulatory Visit: Payer: Self-pay | Admitting: Internal Medicine

## 2022-12-03 DIAGNOSIS — G4700 Insomnia, unspecified: Secondary | ICD-10-CM

## 2022-12-05 NOTE — Telephone Encounter (Signed)
Requested Prescriptions  Pending Prescriptions Disp Refills   traZODone (DESYREL) 100 MG tablet [Pharmacy Med Name: traZODone 100 MG TABLET] 90 tablet 0    Sig: TAKE 1 TABLET BY MOUTH AT BEDTIME     Psychiatry: Antidepressants - Serotonin Modulator Passed - 12/03/2022  6:51 AM      Passed - Valid encounter within last 6 months    Recent Outpatient Visits           2 weeks ago Generalized abdominal pain   [place], DO   3 weeks ago Abnormal urine odor   [place], DO   3 months ago Mass of right thigh   [facility] Teodora Medici, DO   4 months ago Viral upper respiratory tract infection   [place], DO   5 months ago Mass of right thigh   [facility] Teodora Medici, DO       Future Appointments             In 1 week Teodora Medici, [facility], [facility]

## 2022-12-12 ENCOUNTER — Ambulatory Visit (INDEPENDENT_AMBULATORY_CARE_PROVIDER_SITE_OTHER): Payer: Medicare HMO | Admitting: Physician Assistant

## 2022-12-12 ENCOUNTER — Encounter: Payer: Self-pay | Admitting: Physician Assistant

## 2022-12-12 ENCOUNTER — Ambulatory Visit: Payer: Medicare HMO | Admitting: Internal Medicine

## 2022-12-12 VITALS — BP 126/78 | HR 90 | Temp 98.1°F | Resp 16 | Ht 65.0 in | Wt 161.4 lb

## 2022-12-12 DIAGNOSIS — Z8679 Personal history of other diseases of the circulatory system: Secondary | ICD-10-CM | POA: Insufficient documentation

## 2022-12-12 DIAGNOSIS — R1084 Generalized abdominal pain: Secondary | ICD-10-CM | POA: Diagnosis not present

## 2022-12-12 NOTE — Progress Notes (Unsigned)
Acute Office Visit   Patient: Sonya Harper   DOB: [birth year]   77 y.o. Female  MRN: [id number] Visit Date: 12/12/2022  Today's healthcare provider: Oswaldo Conroy Alessandra Sawdey, PA-C  Introduced myself to the patient as a Secondary school teacher and provided education on APPs in clinical practice.    Chief Complaint  Patient presents with   Follow-up   Subjective    HPI   She reports she continues to have concerns for abdominal pain  She reports her pain has been ongoing for about 3 weeks  Location: along lower abdomen  Pain level and character: 6/10 on avg  She reports she is losing weight without trying to. She reports some decreased appetite  She is not sure if eating makes it worse or better She was constipated and took something for it - she reports having a bowel movement did not help with relief  She reports regular bowel movement yesterday and no impact on pain after this    Medications: Outpatient Medications Prior to Visit  Medication Sig   albuterol (VENTOLIN HFA) 108 (90 Base) MCG/ACT inhaler Inhale 2 puffs into the lungs every 6 (six) hours as needed for wheezing or shortness of breath.   amLODipine (NORVASC) 5 MG tablet Take 1 tablet (5 mg total) by mouth daily.   apixaban (ELIQUIS) 5 MG TABS tablet Take by mouth.   atorvastatin (LIPITOR) 10 MG tablet Take 1 tablet (10 mg total) by mouth every other day.   Blood Pressure Monitoring (BLOOD PRESSURE MONITOR/L CUFF) MISC 1 each by Does not apply route daily.   Cholecalciferol (VITAMIN D-1000 MAX ST) 25 MCG (1000 UT) tablet Take by mouth.   ciclopirox (PENLAC) 8 % solution Apply topically at bedtime. Apply over nail and surrounding skin. Apply daily over previous coat. After seven (7) days, may remove with alcohol and continue cycle.   DIPHENHYDRAMINE HCL, TOPICAL, (BENADRYL ITCH STOPPING) 2 % GEL Apply 1 application topically in the morning, at noon, and at bedtime.   EPINEPHrine 0.3 mg/0.3 mL IJ SOAJ injection INJECT INTO THE MIDDLE  OF THE OUTER THIGH AND HOLD FOR 10 SECONDS AS NEEDED FOR SEVERE ALLERGIC REACTION THEN CALL 911 IF USED   fluticasone (FLONASE) 50 MCG/ACT nasal spray Place 2 sprays into both nostrils daily.   fluticasone furoate-vilanterol (BREO ELLIPTA) 200-25 MCG/ACT AEPB Inhale 1 puff into the lungs daily.   loratadine (CLARITIN) 10 MG tablet Take 1 tablet (10 mg total) by mouth daily.   losartan (COZAAR) 50 MG tablet Take 1 tablet (50 mg total) by mouth daily.   mometasone (ELOCON) 0.1 % cream Apply once daily up to 5 days a week for eczema flares   montelukast (SINGULAIR) 10 MG tablet Take 1 tablet (10 mg total) by mouth at bedtime.   mupirocin ointment (BACTROBAN) 2 % Apply 1 Application topically daily. Qd to excision site   nitroGLYCERIN (NITROSTAT) 0.4 MG SL tablet Place 1 tablet (0.4 mg total) under the tongue every 5 (five) minutes as needed for chest pain. Max 3 pills per episode   pantoprazole (PROTONIX) 40 MG tablet TAKE 1 TABLET BY MOUTH DAILY   promethazine-dextromethorphan (PROMETHAZINE-DM) 6.25-15 MG/5ML syrup Take 5 mLs by mouth 3 (three) times daily as needed for cough.   SYNTHROID 50 MCG tablet TAKE 1 BY MOUTH DAILY. DO NOT SUBSTITUTE   traZODone (DESYREL) 100 MG tablet TAKE 1 TABLET BY MOUTH AT BEDTIME   No facility-administered medications prior to visit.  Review of Systems  Constitutional:  Negative for chills and fever.  Gastrointestinal:  Positive for abdominal pain and constipation. Negative for blood in stool, diarrhea, nausea and vomiting.  Genitourinary:  Negative for difficulty urinating, dysuria, flank pain and frequency.    {Labs  Heme  Chem  Endocrine  Serology  Results Review (optional):23779}   Objective    BP 126/78 (BP Location: Right Arm, Patient Position: Sitting, Cuff Size: Normal)   Pulse 90   Temp 98.1 F (36.7 C) (Oral)   Resp 16   Ht 5\' 5"  (1.651 m) Comment: per patient  Wt 161 lb 6.4 oz (73.2 kg)   SpO2 94%   BMI 26.86 kg/m  {Show previous  vital signs (optional):23777}  Physical Exam Vitals reviewed.  Constitutional:      General: She is awake.     Appearance: Normal appearance. She is well-developed.  HENT:     Head: Normocephalic and atraumatic.  Eyes:     Extraocular Movements: Extraocular movements intact.     Conjunctiva/sclera: Conjunctivae normal.  Pulmonary:     Effort: Pulmonary effort is normal.  Abdominal:     General: Abdomen is flat. Bowel sounds are normal.     Palpations: Abdomen is soft.     Tenderness: There is abdominal tenderness in the epigastric area and periumbilical area.     Hernia: No hernia is present.  Musculoskeletal:     Cervical back: Normal range of motion.  Neurological:     General: No focal deficit present.     Mental Status: She is alert and oriented to person, place, and time. Mental status is at baseline.  Psychiatric:        Attention and Perception: Attention and perception normal.        Mood and Affect: Mood and affect normal.        Speech: Speech normal.        Behavior: Behavior normal. Behavior is cooperative.       No results found for any visits on 12/12/22.  Assessment & Plan      No follow-ups on file.

## 2022-12-12 NOTE — Patient Instructions (Addendum)
I recommend keeping a symptom journal of your abdominal pain  Things to include:  Foods that you have eaten How bad the pain is, when it started, what it feels like and where it is. If pain occurs after eating or drinking- how long it took for the pain to hit and how bad it was.  When the pain seems to start Any medications that you take and if the pain seems to get worse or better after you take them  Any changes to your bowel movements (diarrhea, constipation, blood in your stools, etc) and any impact on the pain If you feel bloated or reduced appetite  If you have heartburn symptoms

## 2023-01-02 NOTE — Progress Notes (Unsigned)
Established Patient Office Visit  Subjective   Patient ID: Sonya Harper, female    DOB: 11-25-1945  Age: 77 y.o. MRN: [id number]  No chief complaint on file.   HPI  Patient is here to follow-up on chronic medical conditions.  Grief: -Patient's husband passed away from T-cell lymphoma in 2023.  One of her daughters lives with her and is providing emotional support.  The patient states that overall she is doing okay dealing with her grief.  She was recommended free counseling services, which she is thinking about trying.  Hypertension/A.fib: -Medications: Amlodipine 5, Losartan 25, Eliquis 5 BID -Patient is compliant with above medications and reports no side effects. -Checking BP at home (average): Not checking  -Denies any acute vision changes, LE edema or symptoms of hypotension -Follows with Cardiology, note from 10/17/22  reviewed. Had been on Sotalol for rhythm control in the past, however this was discontinued as she did not have reoccurrence of symptoms.  Nuclear stress test in 2018 was unremarkable.  TEE from 3/22 unremarkable as well with a normal EF.  HLD: -Medications: Lipitor 10 mg every other day  -Patient is compliant with above medications and reports no side effects.  -Last lipid panel: Lipid Panel     Component Value Date/Time   CHOL 121 10/19/2021 1514   TRIG 57 10/19/2021 1514   HDL 52 10/19/2021 1514   CHOLHDL 2.3 10/19/2021 1514   LDLCALC 56 10/19/2021 1514   Hx of Pre-Diabetes: -Last A1c 6.0% 5/23  Asthma:  -Asthma status: controlled -Current Treatments: Breo-Ellipta 200, increased at last office visit, albuterol PRN -Satisfied with current treatment?: no -Albuterol/rescue inhaler frequency: Occasionally -Dyspnea frequency: Occasionally -Wheezing frequency: Occasionally, not recently -Cough frequency: Daily, nonproductive -Limitation of activity: no -Current upper respiratory symptoms: no -Visits to ER or Urgent Care in past year:  no -Pneumovax:  Prevnar 23 in 2018, 13 in 2016 -Influenza: Up to Date  GERD: -Currently on Protonix 40 mg BID, controls symptoms most of the time  -Had EGD 1/21, which did show gastritis, negative for dysplasia  Hypothyroidism: -Medications: Levothyroxine 50 mcg  -Patient is compliant with the above medication (s) at the above dose and reports no medication side effects.  -Denies weight changes, cold./heat intolerance, skin changes, anxiety/palpitations  -Last TSH: 1.172 9/23 -Follows with Endocrinology, note from 07/18/22 reviewed, seen on annual basis.   Health Maintenance: -Blood work due -Mammogram 8/23 -Colonoscopy 09/20/19 - repeat in 5 years  Review of Systems  Constitutional:  Negative for chills and fever.  Respiratory:  Negative for shortness of breath and wheezing.   Cardiovascular:  Negative for chest pain.  Gastrointestinal:  Negative for abdominal pain and heartburn.  Genitourinary:  Positive for flank pain, frequency and urgency. Negative for dysuria and hematuria.  Neurological:  Negative for headaches.      Objective:     There were no vitals taken for this visit. BP Readings from Last 3 Encounters:  12/12/22 126/78  11/21/22 132/80  11/14/22 124/70   Wt Readings from Last 3 Encounters:  12/12/22 161 lb 6.4 oz (73.2 kg)  11/21/22 165 lb 6.4 oz (75 kg)  11/14/22 162 lb 3.2 oz (73.6 kg)      Physical Exam Constitutional:      Appearance: Normal appearance.  HENT:     Head: Normocephalic and atraumatic.  Cardiovascular:     Rate and Rhythm: Normal rate and regular rhythm.  Pulmonary:     Effort: Pulmonary effort is normal.  Breath sounds: Normal breath sounds.  Abdominal:     Tenderness: There is left CVA tenderness. There is no right CVA tenderness.  Musculoskeletal:     Right lower leg: No edema.     Left lower leg: No edema.  Skin:    General: Skin is warm and dry.  Neurological:     General: No focal deficit present.     Mental  Status: She is alert. Mental status is at baseline.  Psychiatric:        Mood and Affect: Mood normal.        Behavior: Behavior normal.      No results found for any visits on 01/03/23.   Last CBC Lab Results  Component Value Date   WBC 5.8 05/14/2022   HGB 12.3 05/14/2022   HCT 40.0 05/14/2022   MCV 82.6 05/14/2022   MCH 25.4 (L) 05/14/2022   RDW 16.3 (H) 05/14/2022   PLT 259 05/14/2022   Last metabolic panel Lab Results  Component Value Date   GLUCOSE 98 05/14/2022   NA 141 05/14/2022   K 4.0 05/14/2022   CL 105 05/14/2022   CO2 24 05/14/2022   BUN 16 05/14/2022   CREATININE 1.01 (H) 05/14/2022   EGFR 64 10/19/2021   CALCIUM 9.1 05/14/2022   PHOS 3.5 02/26/2018   PROT 6.1 10/19/2021   ALBUMIN 3.9 02/08/2021   BILITOT 0.5 10/19/2021   ALKPHOS 99 02/08/2021   AST 12 10/19/2021   ALT 8 10/19/2021   ANIONGAP 12 05/14/2022   Last lipids Lab Results  Component Value Date   CHOL 121 10/19/2021   HDL 52 10/19/2021   LDLCALC 56 10/19/2021   TRIG 57 10/19/2021   CHOLHDL 2.3 10/19/2021   Last hemoglobin A1c Lab Results  Component Value Date   HGBA1C 6.0 (A) 01/25/2022   Last thyroid functions Lab Results  Component Value Date   TSH 1.334 05/14/2022   Last vitamin D Lab Results  Component Value Date   VD25OH 24 (L) 08/30/2018   Last vitamin B12 and Folate No results found for: "VITAMINB12", "FOLATE"    The ASCVD Risk score (Arnett DK, et al., 2019) failed to calculate for the following reasons:   The valid total cholesterol range is 130 to 320 mg/dL    Assessment & Plan:   1. Urinary frequency/Hematuria, unspecified type: UA in the office negative for signs of infection, however there was microscopic blood.  She is also having some left mild CVA tenderness.  Urine will be sent for culture, however we did discuss the possibility of a kidney stone.  If she has severe pain she will go to the ER.  Recommend she increase her oral hydration.  - POCT  urinalysis dipstick - Urine Culture  2. Vaginal itching: Vaginal swab today as well, denies change in vaginal discharge. - Cervicovaginal ancillary only  3. Benign essential HTN: Blood pressure above goal today.  Increase losartan to 50 mg daily, continue amlodipine 5 mg as well.  A blood pressure cuff was sent to her pharmacy, she will start checking her blood pressure at home and follow-up here in 1 month for blood pressure recheck.  - Blood Pressure Monitoring (BLOOD PRESSURE MONITOR/L CUFF) MISC; 1 each by Does not apply route daily.  Dispense: 1 each; Refill: 0 - losartan (COZAAR) 50 MG tablet; Take 1 tablet (50 mg total) by mouth daily.  Dispense: 30 tablet; Refill: 1  4. Skin lesion of face: Referral placed to dermatology.  - Ambulatory  referral to Dermatology  5. Persistent asthma with acute exacerbation, unspecified asthma severity: Better since going up on her Breo Ellipta.  Continue albuterol as needed.  6. Hyperlipidemia, mixed: Chronic.  Continue Lipitor 10 mg daily.  7. Prediabetes: Not currently on any medication, plan to recheck at next visit.  8. Gastroesophageal reflux disease without esophagitis: Stable.  Continue pantoprazole 40 mg daily.   No follow-ups on file.    Margarita Mail, DO

## 2023-01-03 ENCOUNTER — Encounter: Payer: Self-pay | Admitting: Internal Medicine

## 2023-01-03 ENCOUNTER — Ambulatory Visit (INDEPENDENT_AMBULATORY_CARE_PROVIDER_SITE_OTHER): Payer: Medicare HMO | Admitting: Internal Medicine

## 2023-01-03 VITALS — BP 124/76 | HR 79 | Temp 97.9°F | Resp 16 | Ht 65.0 in | Wt 162.7 lb

## 2023-01-03 DIAGNOSIS — K219 Gastro-esophageal reflux disease without esophagitis: Secondary | ICD-10-CM

## 2023-01-03 DIAGNOSIS — R7303 Prediabetes: Secondary | ICD-10-CM | POA: Diagnosis not present

## 2023-01-03 DIAGNOSIS — D649 Anemia, unspecified: Secondary | ICD-10-CM | POA: Diagnosis not present

## 2023-01-03 DIAGNOSIS — J4530 Mild persistent asthma, uncomplicated: Secondary | ICD-10-CM

## 2023-01-03 DIAGNOSIS — E782 Mixed hyperlipidemia: Secondary | ICD-10-CM | POA: Diagnosis not present

## 2023-01-03 DIAGNOSIS — I10 Essential (primary) hypertension: Secondary | ICD-10-CM | POA: Diagnosis not present

## 2023-01-03 DIAGNOSIS — E559 Vitamin D deficiency, unspecified: Secondary | ICD-10-CM

## 2023-01-03 DIAGNOSIS — G4700 Insomnia, unspecified: Secondary | ICD-10-CM | POA: Diagnosis not present

## 2023-01-03 DIAGNOSIS — I480 Paroxysmal atrial fibrillation: Secondary | ICD-10-CM | POA: Diagnosis not present

## 2023-01-03 MED ORDER — APIXABAN 5 MG PO TABS: 5.0000 mg | ORAL_TABLET | Freq: Two times a day (BID) | ORAL | 3 refills | Status: DC

## 2023-01-03 MED ORDER — FLUTICASONE FUROATE-VILANTEROL 200-25 MCG/ACT IN AEPB: 1.0000 | INHALATION_SPRAY | Freq: Every day | RESPIRATORY_TRACT | 11 refills | Status: DC

## 2023-01-03 MED ORDER — PANTOPRAZOLE SODIUM 40 MG PO TBEC: 40.0000 mg | DELAYED_RELEASE_TABLET | Freq: Every day | ORAL | 1 refills | Status: DC

## 2023-01-03 MED ORDER — ALBUTEROL SULFATE HFA 108 (90 BASE) MCG/ACT IN AERS: 2.0000 | INHALATION_SPRAY | Freq: Four times a day (QID) | RESPIRATORY_TRACT | 2 refills | Status: DC | PRN

## 2023-01-04 LAB — COMPLETE METABOLIC PANEL WITH GFR
AG Ratio: 1.8 (calc) (ref 1.0–2.5)
ALT: 7 U/L (ref 6–29)
AST: 12 U/L (ref 10–35)
Albumin: 3.9 g/dL (ref 3.6–5.1)
Alkaline phosphatase (APISO): 85 U/L (ref 37–153)
BUN/Creatinine Ratio: 13 (calc) (ref 6–22)
BUN: 17 mg/dL (ref 7–25)
CO2: 28 mmol/L (ref 20–32)
Calcium: 8.8 mg/dL (ref 8.6–10.4)
Chloride: 105 mmol/L (ref 98–110)
Creat: 1.26 mg/dL — ABNORMAL HIGH (ref 0.60–1.00)
Globulin: 2.2 g/dL (calc) (ref 1.9–3.7)
Glucose, Bld: 89 mg/dL (ref 65–99)
Potassium: 4.3 mmol/L (ref 3.5–5.3)
Sodium: 139 mmol/L (ref 135–146)
Total Bilirubin: 0.3 mg/dL (ref 0.2–1.2)
Total Protein: 6.1 g/dL (ref 6.1–8.1)
eGFR: 45 mL/min/{1.73_m2} — ABNORMAL LOW (ref >=60)

## 2023-01-04 LAB — LIPID PANEL
Cholesterol: 141 mg/dL (ref <200)
HDL: 61 mg/dL (ref >=50)
LDL Cholesterol (Calc): 66 mg/dL (calc)
Non-HDL Cholesterol (Calc): 80 mg/dL (calc) (ref <130)
Total CHOL/HDL Ratio: 2.3 (calc) (ref <5.0)
Triglycerides: 64 mg/dL (ref <150)

## 2023-01-04 LAB — CBC WITH DIFFERENTIAL/PLATELET
Absolute Monocytes: 352 {cells}/uL (ref 200–950)
Basophils Absolute: 31 {cells}/uL (ref 0–200)
Basophils Relative: 0.6 %
Eosinophils Absolute: 41 {cells}/uL (ref 15–500)
Eosinophils Relative: 0.8 %
HCT: 33.7 % — ABNORMAL LOW (ref 35.0–45.0)
Hemoglobin: 10.6 g/dL — ABNORMAL LOW (ref 11.7–15.5)
Lymphs Abs: 1709 {cells}/uL (ref 850–3900)
MCH: 25.2 pg — ABNORMAL LOW (ref 27.0–33.0)
MCHC: 31.5 g/dL — ABNORMAL LOW (ref 32.0–36.0)
MCV: 80 fL (ref 80.0–100.0)
MPV: 11.8 fL (ref 7.5–12.5)
Monocytes Relative: 6.9 %
Neutro Abs: 2968 {cells}/uL (ref 1500–7800)
Neutrophils Relative %: 58.2 %
Platelets: 272 Thousand/uL (ref 140–400)
RBC: 4.21 Million/uL (ref 3.80–5.10)
RDW: 14.9 % (ref 11.0–15.0)
Total Lymphocyte: 33.5 %
WBC: 5.1 Thousand/uL (ref 3.8–10.8)

## 2023-01-04 LAB — HEMOGLOBIN A1C
Hgb A1c MFr Bld: 6 % of total Hgb — ABNORMAL HIGH (ref <5.7)
Mean Plasma Glucose: 126 mg/dL
eAG (mmol/L): 7 mmol/L

## 2023-01-04 LAB — VITAMIN D 25 HYDROXY (VIT D DEFICIENCY, FRACTURES): Vit D, 25-Hydroxy: 44 ng/mL (ref 30–100)

## 2023-01-04 NOTE — Addendum Note (Signed)
Addended by: Margarita Mail on: 01/04/2023 07:56 AM   Modules accepted: Orders

## 2023-01-09 ENCOUNTER — Telehealth: Payer: Self-pay | Admitting: Internal Medicine

## 2023-01-09 NOTE — Telephone Encounter (Signed)
Called and left a message for pt to schedule an appt        Copied from CRM [id number]. Topic: Referral - Request for Referral >> Jan 09, 2023  8:58 AM Dondra Prader A wrote: Has patient seen PCP for this complaint? Yes.   *If NO, is insurance requiring patient see PCP for this issue before PCP can refer them? Referral for which specialty: Urologists Preferred provider/office: pt would like for PCP to refer, [facility] [place] [facility] his name.  Reason for referral: going back and forth to the backroom a lot

## 2023-01-09 NOTE — Telephone Encounter (Signed)
Pt will need appt her first

## 2023-01-09 NOTE — Telephone Encounter (Signed)
Needs appt here first

## 2023-01-09 NOTE — Telephone Encounter (Signed)
Called and left message for pt to call back and schedule an appt

## 2023-01-10 ENCOUNTER — Encounter: Payer: Self-pay | Admitting: Family Medicine

## 2023-01-10 ENCOUNTER — Ambulatory Visit (INDEPENDENT_AMBULATORY_CARE_PROVIDER_SITE_OTHER): Payer: Medicare HMO | Admitting: Family Medicine

## 2023-01-10 VITALS — BP 160/86 | HR 90 | Temp 97.8°F | Resp 16 | Ht 65.0 in | Wt 159.3 lb

## 2023-01-10 DIAGNOSIS — R350 Frequency of micturition: Secondary | ICD-10-CM

## 2023-01-10 DIAGNOSIS — R351 Nocturia: Secondary | ICD-10-CM | POA: Diagnosis not present

## 2023-01-10 DIAGNOSIS — K219 Gastro-esophageal reflux disease without esophagitis: Secondary | ICD-10-CM | POA: Diagnosis not present

## 2023-01-10 DIAGNOSIS — R1030 Lower abdominal pain, unspecified: Secondary | ICD-10-CM | POA: Diagnosis not present

## 2023-01-10 DIAGNOSIS — N179 Acute kidney failure, unspecified: Secondary | ICD-10-CM

## 2023-01-10 DIAGNOSIS — I10 Essential (primary) hypertension: Secondary | ICD-10-CM | POA: Diagnosis not present

## 2023-01-10 DIAGNOSIS — Z7901 Long term (current) use of anticoagulants: Secondary | ICD-10-CM

## 2023-01-10 DIAGNOSIS — R634 Abnormal weight loss: Secondary | ICD-10-CM | POA: Diagnosis not present

## 2023-01-10 DIAGNOSIS — R1084 Generalized abdominal pain: Secondary | ICD-10-CM | POA: Diagnosis not present

## 2023-01-10 DIAGNOSIS — E039 Hypothyroidism, unspecified: Secondary | ICD-10-CM

## 2023-01-10 DIAGNOSIS — D649 Anemia, unspecified: Secondary | ICD-10-CM | POA: Diagnosis not present

## 2023-01-10 LAB — POCT URINALYSIS DIPSTICK
Bilirubin, UA: NEGATIVE
Blood, UA: NEGATIVE
Glucose, UA: NEGATIVE
Ketones, UA: NEGATIVE
Leukocytes, UA: NEGATIVE
Nitrite, UA: NEGATIVE
Protein, UA: NEGATIVE
Spec Grav, UA: 1.01 (ref 1.010–1.025)
Urobilinogen, UA: 0.2 E.U./dL — AB
pH, UA: 5 (ref 5.0–8.0)

## 2023-01-10 MED ORDER — PANTOPRAZOLE SODIUM 40 MG PO TBEC: 40.0000 mg | DELAYED_RELEASE_TABLET | Freq: Two times a day (BID) | ORAL | 0 refills | Status: DC

## 2023-01-10 NOTE — Progress Notes (Signed)
Patient ID: Sonya Harper, female    DOB: June 27, 1946, 77 y.o.   MRN: [id number]  PCP: Margarita Mail, DO  Chief Complaint  Patient presents with   Abdominal Pain    Lower abdominal, ongoing issue has been seen before   Urinary Frequency    Subjective:   Sonya Harper is a 77 y.o. female, presents to clinic with CC of the following:  HPI  Here for lower abd pain and urinary frequency she is having more nocturia - 4 to 5 x a night, cannot sleep - this has been ongoing for months  Multiple visits this year for similar sx, including GYN OV for dysuria/frequency, generalized abd pain, urine odor  Prior work up by PCP and float primary care providers, GYN  Urine testing, vaginal swabs, CT abd/pelvis all negative S/p hysterectomy  Last colonscopy and EGD Jan 2021 Last labs and testing all reviewed New anemia with 4/30 labs - pcp has ordered f/up iron studies, pt on eliquis and PPI Pt on protonix for a long time, has been on BID dosing in the past,   no nausea with sx, Abd pain generalized worse with eating and when waking up Unintentional weight loss, pain is located central abdomen upper and mid without radiation she reports sometimes it feels like menstrual cramps other times just feels uncomfortable.  She reports not wanting to get out of bed in the morning because her abdominal discomfort is worse sometimes then however no other changes with deep breathing, or positional changes She denies dysuria, hematuria, vaginal symptoms She is losing weight without trying She reports no change to bowel movements since about 2021 she reports she had somewhat of slowed bowels after her last colonoscopy, sometimes is needing stool softeners to have a bowel movement she previously was having a bowel movement once daily She denies melena, hematochezia She is on Eliquis she denies any hematuria, spontaneous bruising, petechia, blood when brushing teeth  Urine today Results for orders  placed or performed in visit on 01/10/23  POCT urinalysis dipstick  Result Value Ref Range   Color, UA Dark Yellow    Clarity, UA Cloudy    Glucose, UA Negative Negative   Bilirubin, UA Negative    Ketones, UA NegativeNegativeNegative    Spec Grav, UA 1.010 1.010 - 1.025   Blood, UA Negative    pH, UA 5.0 5.0 - 8.0   Protein, UA Negative Negative   Urobilinogen, UA 0.2 (A) 0.2 or 1.0 E.U./dL   Nitrite, UA Negative    Leukocytes, UA Negative Negative   Appearance Yellow    Odor Foul    Weights  Wt Readings from Last 15 Encounters:  01/10/23 159 lb 4.8 oz (72.3 kg)  01/03/23 162 lb 11.2 oz (73.8 kg)  12/12/22 161 lb 6.4 oz (73.2 kg)  11/21/22 165 lb 6.4 oz (75 kg)  11/14/22 162 lb 3.2 oz (73.6 kg)  08/10/22 170 lb 6.4 oz (77.3 kg)  06/28/22 174 lb 1.6 oz (79 kg)  06/09/22 178 lb 1.6 oz (80.8 kg)  05/16/22 178 lb 6.4 oz (80.9 kg)  05/14/22 183 lb 13.8 oz (83.4 kg)  04/14/22 183 lb 14.4 oz (83.4 kg)  02/16/22 187 lb (84.8 kg)  02/07/22 184 lb 1.6 oz (83.5 kg)  02/02/22 185 lb (83.9 kg)  01/25/22 184 lb 1.6 oz (83.5 kg)   BMI Readings from Last 5 Encounters:  01/10/23 26.51 kg/m  01/03/23 27.07 kg/m  12/12/22 26.86 kg/m  11/21/22 27.52 kg/m  11/14/22 26.99 kg/m    Hypothyroid managed by endocrine - last TSH sept - sees Dr. Tedd Sias once a year    Patient Active Problem List   Diagnosis Date Noted   Status post radiofrequency ablation for arrhythmia 12/12/2022   Exposure to COVID-19 virus 09/23/2020   Hypothyroidism, adult 05/21/2020   Persistent asthma with acute exacerbation 04/24/2020   Polyp of transverse colon    Polyp of ascending colon    Chronic anticoagulation 09/12/2019   Paroxysmal atrial fibrillation (HCC) 09/12/2019   AKI (acute kidney injury) (HCC) 06/26/2019   Grade I diastolic dysfunction 06/26/2019   Mild mitral regurgitation 06/26/2019   Mild tricuspid regurgitation 06/26/2019   Lumbar facet arthropathy 11/06/2018   Prediabetes 02/27/2018    Medication monitoring encounter 11/13/2017   Abnormal laboratory test 10/16/2017   Palpitations 08/23/2017   Breast pain in female 08/10/2017   Obesity (BMI 30.0-34.9) 08/10/2017   Protein in urine 08/10/2017   Vaginal atrophy 08/10/2017   Benign essential HTN 11/16/2016   Hyperlipidemia, mixed 11/16/2016   SOBOE (shortness of breath on exertion) 11/16/2016   Stable angina pectoris 11/16/2016   Dysrhythmia 07/07/2016   Right low back pain 07/07/2016   Fall on or from stairs or steps 07/07/2016   Family history of malignant neoplasm of gastrointestinal tract    Benign neoplasm of ascending colon    Benign neoplasm of transverse colon    Benign neoplasm of descending colon    Goiter 03/03/2015   Asthma, mild persistent 03/03/2015   Gastroesophageal reflux disease without esophagitis 03/03/2015      Current Outpatient Medications:    albuterol (VENTOLIN HFA) 108 (90 Base) MCG/ACT inhaler, Inhale 2 puffs into the lungs every 6 (six) hours as needed for wheezing or shortness of breath., Disp: 1 each, Rfl: 2   amLODipine (NORVASC) 5 MG tablet, Take 1 tablet (5 mg total) by mouth daily., Disp: 90 tablet, Rfl: 1   apixaban (ELIQUIS) 5 MG TABS tablet, Take 1 tablet (5 mg total) by mouth 2 (two) times daily., Disp: 180 tablet, Rfl: 3   atorvastatin (LIPITOR) 10 MG tablet, Take 1 tablet (10 mg total) by mouth every other day., Disp: 45 tablet, Rfl: 1   Blood Pressure Monitoring (BLOOD PRESSURE MONITOR/L CUFF) MISC, 1 each by Does not apply route daily., Disp: 1 each, Rfl: 0   Cholecalciferol (VITAMIN D-1000 MAX ST) 25 MCG (1000 UT) tablet, Take by mouth., Disp: , Rfl:    ciclopirox (PENLAC) 8 % solution, Apply topically at bedtime. Apply over nail and surrounding skin. Apply daily over previous coat. After seven (7) days, may remove with alcohol and continue cycle., Disp: 6.6 mL, Rfl: 0   DIPHENHYDRAMINE HCL, TOPICAL, (BENADRYL ITCH STOPPING) 2 % GEL, Apply 1 application topically in the  morning, at noon, and at bedtime., Disp: 118 mL, Rfl: 1   EPINEPHrine 0.3 mg/0.3 mL IJ SOAJ injection, INJECT INTO THE MIDDLE OF THE OUTER THIGH AND HOLD FOR 10 SECONDS AS NEEDED FOR SEVERE ALLERGIC REACTION THEN CALL 911 IF USED, Disp: 2 each, Rfl: 1   fluticasone (FLONASE) 50 MCG/ACT nasal spray, Place 2 sprays into both nostrils daily., Disp: 16 g, Rfl: 6   fluticasone furoate-vilanterol (BREO ELLIPTA) 200-25 MCG/ACT AEPB, Inhale 1 puff into the lungs daily., Disp: 1 each, Rfl: 11   loratadine (CLARITIN) 10 MG tablet, Take 1 tablet (10 mg total) by mouth daily., Disp: 30 tablet, Rfl: 11   losartan (COZAAR) 50 MG tablet, Take 1 tablet (50 mg total) by  mouth daily., Disp: 90 tablet, Rfl: 1   mometasone (ELOCON) 0.1 % cream, Apply once daily up to 5 days a week for eczema flares, Disp: 15 g, Rfl: 1   montelukast (SINGULAIR) 10 MG tablet, Take 1 tablet (10 mg total) by mouth at bedtime., Disp: 90 tablet, Rfl: 1   mupirocin ointment (BACTROBAN) 2 %, Apply 1 Application topically daily. Qd to excision site, Disp: 22 g, Rfl: 1   nitroGLYCERIN (NITROSTAT) 0.4 MG SL tablet, Place 1 tablet (0.4 mg total) under the tongue every 5 (five) minutes as needed for chest pain. Max 3 pills per episode, Disp: 50 tablet, Rfl: 3   pantoprazole (PROTONIX) 40 MG tablet, Take 1 tablet (40 mg total) by mouth daily., Disp: 90 tablet, Rfl: 1   promethazine-dextromethorphan (PROMETHAZINE-DM) 6.25-15 MG/5ML syrup, Take 5 mLs by mouth 3 (three) times daily as needed for cough., Disp: 180 mL, Rfl: 1   SYNTHROID 50 MCG tablet, TAKE 1 BY MOUTH DAILY. DO NOT SUBSTITUTE, Disp: , Rfl: 0   traZODone (DESYREL) 100 MG tablet, TAKE 1 TABLET BY MOUTH AT BEDTIME, Disp: 90 tablet, Rfl: 0   Allergies  Allergen Reactions   Molds & Smuts Anaphylaxis   Penicillins Nausea And Vomiting and Rash    Has patient had a PCN reaction causing immediate rash, facial/tongue/throat swelling, SOB or lightheadedness with hypotension: Yes Has patient had  a PCN reaction causing severe rash involving mucus membranes or skin necrosis: No Has patient had a PCN reaction that required hospitalization: No Has patient had a PCN reaction occurring within the last 10 years: No If all of the above answers are "NO", then may proceed with Cephalosporin use.      Social History   Tobacco Use   Smoking status: Never   Smokeless tobacco: Never  Vaping Use   Vaping Use: Never used  Substance Use Topics   Alcohol use: No    Alcohol/week: 0.0 standard drinks of alcohol   Drug use: Never      Chart Review Today: I personally reviewed active problem list, medication list, allergies, family history, social history, health maintenance, notes from last encounter, lab results, imaging with the patient/caregiver today.   Review of Systems  Constitutional:  Positive for unexpected weight change. Negative for activity change, appetite change, chills, diaphoresis, fatigue and fever.  HENT: Negative.    Eyes: Negative.   Respiratory: Negative.    Cardiovascular: Negative.  Negative for chest pain, palpitations and leg swelling.  Gastrointestinal: Negative.  Negative for abdominal distention, blood in stool, nausea, rectal pain and vomiting.  Endocrine: Negative.   Genitourinary: Negative.   Musculoskeletal: Negative.   Skin: Negative.   Allergic/Immunologic: Negative.   Neurological: Negative.   Hematological: Negative.   Psychiatric/Behavioral: Negative.    All other systems reviewed and are negative.      Objective:   Vitals:   01/10/23 0932  BP: (!) 160/86  Pulse: 90  Resp: 16  Temp: 97.8 F (36.6 C)  TempSrc: Oral  SpO2: 97%  Weight: 159 lb 4.8 oz (72.3 kg)  Height: 5\' 5"  (1.651 m)    Body mass index is 26.51 kg/m.  Physical Exam Vitals and nursing note reviewed.  Constitutional:      General: She is not in acute distress.    Appearance: Normal appearance. She is well-developed and normal weight. She is not ill-appearing,  toxic-appearing or diaphoretic.     Interventions: Face mask in place.  HENT:     Head: Normocephalic and atraumatic.  Right Ear: External ear normal.     Left Ear: External ear normal.  Eyes:     General: Lids are normal. No scleral icterus.       Right eye: No discharge.        Left eye: No discharge.     Conjunctiva/sclera: Conjunctivae normal.  Neck:     Trachea: Phonation normal. No tracheal deviation.  Cardiovascular:     Rate and Rhythm: Normal rate and regular rhythm.     Pulses: Normal pulses.          Radial pulses are 2+ on the right side and 2+ on the left side.       Posterior tibial pulses are 2+ on the right side and 2+ on the left side.     Heart sounds: Normal heart sounds. No murmur heard.    No friction rub. No gallop.  Pulmonary:     Effort: Pulmonary effort is normal. No respiratory distress.     Breath sounds: Normal breath sounds. No stridor. No wheezing, rhonchi or rales.  Chest:     Chest wall: No tenderness.  Abdominal:     General: Bowel sounds are normal. There is no distension.     Palpations: Abdomen is soft. There is no hepatomegaly, splenomegaly, mass or pulsatile mass.     Tenderness: There is abdominal tenderness (mild) in the epigastric area and periumbilical area. There is no right CVA tenderness, left CVA tenderness, guarding or rebound. Negative signs include Murphy's sign.     Comments: Soft, obese abd  Musculoskeletal:     Right lower leg: No edema.     Left lower leg: No edema.  Skin:    General: Skin is warm and dry.     Coloration: Skin is not jaundiced or pale.     Findings: No bruising or rash.  Neurological:     Mental Status: She is alert.     Motor: No abnormal muscle tone.  Psychiatric:        Mood and Affect: Mood normal.        Speech: Speech normal.        Behavior: Behavior normal.      Results for orders placed or performed in visit on 01/03/23  CBC w/Diff/Platelet  Result Value Ref Range   WBC 5.1 3.8 - 10.8  Thousand/uL   RBC 4.21 3.80 - 5.10 Million/uL   Hemoglobin 10.6 (L) 11.7 - 15.5 g/dL   HCT 16.1 (L) 09.6 - 04.5 %   MCV 80.0 80.0 - 100.0 fL   MCH 25.2 (L) 27.0 - 33.0 pg   MCHC 31.5 (L) 32.0 - 36.0 g/dL   RDW 40.9 81.1 - 91.4 %   Platelets 272 140 - 400 Thousand/uL   MPV 11.8 7.5 - 12.5 fL   Neutro Abs 2,968 1,500 - 7,800 cells/uL   Lymphs Abs 1,709 850 - 3,900 cells/uL   Absolute Monocytes 352 200 - 950 cells/uL   Eosinophils Absolute 41 15 - 500 cells/uL   Basophils Absolute 31 0 - 200 cells/uL   Neutrophils Relative % 58.2 %   Total Lymphocyte 33.5 %   Monocytes Relative 6.9 %   Eosinophils Relative 0.8 %   Basophils Relative 0.6 %  COMPLETE METABOLIC PANEL WITH GFR  Result Value Ref Range   Glucose, Bld 89 65 - 99 mg/dL   BUN 17 7 - 25 mg/dL   Creat 7.82 (H) 9.56 - 1.00 mg/dL   eGFR 45 (L) > OR =  60 mL/min/1.81m2   BUN/Creatinine Ratio 13 6 - 22 (calc)   Sodium 139 135 - 146 mmol/L   Potassium 4.3 3.5 - 5.3 mmol/L   Chloride 105 98 - 110 mmol/L   CO2 28 20 - 32 mmol/L   Calcium 8.8 8.6 - 10.4 mg/dL   Total Protein 6.1 6.1 - 8.1 g/dL   Albumin 3.9 3.6 - 5.1 g/dL   Globulin 2.2 1.9 - 3.7 g/dL (calc)   AG Ratio 1.8 1.0 - 2.5 (calc)   Total Bilirubin 0.3 0.2 - 1.2 mg/dL   Alkaline phosphatase (APISO) 85 37 - 153 U/L   AST 12 10 - 35 U/L   ALT 7 6 - 29 U/L  Lipid Profile  Result Value Ref Range   Cholesterol 141 <200 mg/dL   HDL 61 > OR = 50 mg/dL   Triglycerides 64 <409 mg/dL   LDL Cholesterol (Calc) 66 mg/dL (calc)   Total CHOL/HDL Ratio 2.3 <5.0 (calc)   Non-HDL Cholesterol (Calc) 80 <811 mg/dL (calc)  HgB B1Y  Result Value Ref Range   Hgb A1c MFr Bld 6.0 (H) <5.7 % of total Hgb   Mean Plasma Glucose 126 mg/dL   eAG (mmol/L) 7.0 mmol/L  Vitamin D (25 hydroxy)  Result Value Ref Range   Vit D, 25-Hydroxy 44 30 - 100 ng/mL       Assessment & Plan:     ICD-10-CM   1. Lower abdominal pain  R10.30 POCT urinalysis dipstick    Urine Culture    CBC with  Differential/Platelet    COMPLETE METABOLIC PANEL WITH GFR    Fecal Globin By Immunochemistry    Urine Microscopic    Ambulatory referral to Urology    2. Urinary frequency  R35.0 POCT urinalysis dipstick    Urine Culture    Urine Microscopic    Ambulatory referral to Urology   and nocturia 4 to 5 x a night, neg for UTI several times, recommend urology consult for further work up and treatment    3. Anemia, unspecified type  D64.9 CBC with Differential/Platelet    Iron, TIBC and Ferritin Panel    Fecal Globin By Immunochemistry   H/H drop, hgb was ~12 down to 10 last sept to a week ago, r/o GI blood loss, recheck CBC and add the iron studies per PCP    4. Generalized abdominal pain  R10.84 CBC with Differential/Platelet    COMPLETE METABOLIC PANEL WITH GFR    US Abdomen Limited RUQ (LIVER/GB)   see below for ddx, no focal ttp on exam, no guarding or rebound, no CVA tenderness bilaterally, neg murphy's    5. Weight loss, unintentional  R63.4 CBC with Differential/Platelet    COMPLETE METABOLIC PANEL WITH GFR    Fecal Globin By Immunochemistry    TSH    US Abdomen Limited RUQ (LIVER/GB)   gradual with unremarkable CT abd/pelvis, generalized abd complaints, recheck labs/TSH    6. Chronic anticoagulation  Z79.01 CBC with Differential/Platelet    COMPLETE METABOLIC PANEL WITH GFR   no signs or sx of bleeding but she did have a drop in H/H    7. AKI (acute kidney injury) (HCC)  N17.9 COMPLETE METABOLIC PANEL WITH GFR   recheck renal function    8. Hypothyroidism, adult  E03.9 TSH   will recheck TSH, with sig weight loss over the past year she may need levothyroxine dose adjustments is TSH is suppressed    9. Gastroesophageal reflux disease without esophagitis  K21.9  pantoprazole (PROTONIX) 40 MG tablet   increase pantoprozole to BID for a few weeks to see if this helps with abd pain - recommend f/up with Dr. Servando Snare    10. Benign essential HTN  I10    BP elevated todya, she has not  taken her meds    11. Nocturia  R35.1 Ambulatory referral to Urology     Abd pain Ddx -- can be uncontrolled GERD/gastritis/PUD - trying PPI BID, could do RUQ [place] or hida scan with sx worse when eating - recent CT showed unremarkable gallbladder and bile duct, may need f/up with GI and do recommend urology consult for nocturia/urinary frequency    Return for 2-4 weeks abd pain f/up if not better.   Danelle Berry, PA-C 01/10/23 9:38 AM

## 2023-01-11 LAB — CBC WITH DIFFERENTIAL/PLATELET
Absolute Monocytes: 304 cells/uL (ref 200–950)
Basophils Absolute: 32 cells/uL (ref 0–200)
Basophils Relative: 0.7 %
Eosinophils Absolute: 60 cells/uL (ref 15–500)
Eosinophils Relative: 1.3 %
HCT: 33.8 % — ABNORMAL LOW (ref 35.0–45.0)
Hemoglobin: 10.5 g/dL — ABNORMAL LOW (ref 11.7–15.5)
Lymphs Abs: 1435 cells/uL (ref 850–3900)
MCH: 24.6 pg — ABNORMAL LOW (ref 27.0–33.0)
MCHC: 31.1 g/dL — ABNORMAL LOW (ref 32.0–36.0)
MCV: 79.3 fL — ABNORMAL LOW (ref 80.0–100.0)
MPV: 11.9 fL (ref 7.5–12.5)
Monocytes Relative: 6.6 %
Neutro Abs: 2769 cells/uL (ref 1500–7800)
Neutrophils Relative %: 60.2 %
Platelets: 283 10*3/uL (ref 140–400)
RBC: 4.26 10*6/uL (ref 3.80–5.10)
RDW: 15 % (ref 11.0–15.0)
Total Lymphocyte: 31.2 %
WBC: 4.6 10*3/uL (ref 3.8–10.8)

## 2023-01-11 LAB — URINALYSIS, MICROSCOPIC ONLY
Bacteria, UA: NONE SEEN /HPF
Hyaline Cast: NONE SEEN /LPF
RBC / HPF: NONE SEEN /HPF (ref 0–2)

## 2023-01-11 LAB — COMPLETE METABOLIC PANEL WITH GFR
AG Ratio: 1.7 (calc) (ref 1.0–2.5)
ALT: 8 U/L (ref 6–29)
AST: 13 U/L (ref 10–35)
Albumin: 3.8 g/dL (ref 3.6–5.1)
Alkaline phosphatase (APISO): 86 U/L (ref 37–153)
BUN/Creatinine Ratio: 15 (calc) (ref 6–22)
BUN: 18 mg/dL (ref 7–25)
CO2: 26 mmol/L (ref 20–32)
Calcium: 8.9 mg/dL (ref 8.6–10.4)
Chloride: 107 mmol/L (ref 98–110)
Creat: 1.21 mg/dL — ABNORMAL HIGH (ref 0.60–1.00)
Globulin: 2.3 g/dL (calc) (ref 1.9–3.7)
Glucose, Bld: 95 mg/dL (ref 65–99)
Potassium: 4.4 mmol/L (ref 3.5–5.3)
Sodium: 142 mmol/L (ref 135–146)
Total Bilirubin: 0.5 mg/dL (ref 0.2–1.2)
Total Protein: 6.1 g/dL (ref 6.1–8.1)
eGFR: 47 mL/min/{1.73_m2} — ABNORMAL LOW (ref >=60)

## 2023-01-11 LAB — IRON,TIBC AND FERRITIN PANEL
%SAT: 4 % (calc) — ABNORMAL LOW (ref 16–45)
Ferritin: 4 ng/mL — ABNORMAL LOW (ref 16–288)
Iron: 18 ug/dL — ABNORMAL LOW (ref 45–160)
TIBC: 422 mcg/dL (calc) (ref 250–450)

## 2023-01-11 LAB — URINE CULTURE
MICRO NUMBER:: 14924699
Result:: NO GROWTH
SPECIMEN QUALITY:: ADEQUATE

## 2023-01-11 LAB — TSH: TSH: 1.55 mIU/L (ref 0.40–4.50)

## 2023-01-16 DIAGNOSIS — R350 Frequency of micturition: Secondary | ICD-10-CM | POA: Diagnosis not present

## 2023-01-16 DIAGNOSIS — R1084 Generalized abdominal pain: Secondary | ICD-10-CM | POA: Diagnosis not present

## 2023-01-16 DIAGNOSIS — D649 Anemia, unspecified: Secondary | ICD-10-CM | POA: Diagnosis not present

## 2023-01-16 DIAGNOSIS — N179 Acute kidney failure, unspecified: Secondary | ICD-10-CM | POA: Diagnosis not present

## 2023-01-16 DIAGNOSIS — Z7901 Long term (current) use of anticoagulants: Secondary | ICD-10-CM | POA: Diagnosis not present

## 2023-01-16 DIAGNOSIS — R634 Abnormal weight loss: Secondary | ICD-10-CM | POA: Diagnosis not present

## 2023-01-16 DIAGNOSIS — E039 Hypothyroidism, unspecified: Secondary | ICD-10-CM | POA: Diagnosis not present

## 2023-01-17 ENCOUNTER — Ambulatory Visit
Admission: RE | Admit: 2023-01-17 | Discharge: 2023-01-17 | Disposition: A | Discharge status: Home / Self Care | Payer: Medicare HMO | Source: Ambulatory Visit | Attending: Family Medicine | Admitting: Family Medicine

## 2023-01-17 DIAGNOSIS — R1010 Upper abdominal pain, unspecified: Secondary | ICD-10-CM | POA: Diagnosis not present

## 2023-01-17 DIAGNOSIS — R1084 Generalized abdominal pain: Secondary | ICD-10-CM

## 2023-01-17 DIAGNOSIS — R634 Abnormal weight loss: Secondary | ICD-10-CM | POA: Diagnosis not present

## 2023-01-17 LAB — FECAL GLOBIN BY IMMUNOCHEMISTRY
FECAL GLOBIN RESULT:: NOT DETECTED
MICRO NUMBER:: 14947669
SPECIMEN QUALITY:: ADEQUATE

## 2023-01-30 NOTE — Progress Notes (Deleted)
Established Patient Office Visit  Subjective   Patient ID: Sonya Harper, female    DOB: 05-Nov-1945  Age: 77 y.o. MRN: [id number]  No chief complaint on file.   HPI  Patient is here to follow-up on chronic medical conditions. Overall patient is doing well. She is still having abdominal pain on and off, CT scan negative. Following up with GI in a few months.  Hypertension/A.fib: -Medications: Amlodipine 5, Losartan 50 mg, Eliquis 5 mg BID -Patient is compliant with above medications and reports no side effects. -Checking BP at home (average): Not checking  -Denies any acute vision changes, LE edema or symptoms of hypotension -Follows with Cardiology, note from 10/17/22  reviewed. Had been on Sotalol for rhythm control in the past, however this was discontinued as she did not have reoccurrence of symptoms.  Nuclear stress test in 2018 was unremarkable.  TEE from 3/22 unremarkable as well with a normal EF.  HLD: -Medications: Lipitor 10 mg every other day  -Patient is compliant with above medications and reports no side effects.  -Last lipid panel: Lipid Panel     Component Value Date/Time   CHOL 141 01/03/2023 1121   TRIG 64 01/03/2023 1121   HDL 61 01/03/2023 1121   CHOLHDL 2.3 01/03/2023 1121   LDLCALC 66 01/03/2023 1121   Hx of Pre-Diabetes: -Last A1c 6.0% 5/23 -Not currently on any medication  Asthma:  -Asthma status: controlled -Current Treatments: Breo-Ellipta 200 daily, albuterol PRN -Satisfied with current treatment?: yes -Albuterol/rescue inhaler frequency: Occasionally -Dyspnea frequency: Occasionally -Wheezing frequency: Occasionally, not recently -Cough frequency: Daily, nonproductive -Limitation of activity: no -Current upper respiratory symptoms: no -Visits to ER or Urgent Care in past year: no -Pneumovax:  Prevnar 23 in 2018, 13 in 2016  - discussed Prevnar 20, will discuss again in the fall patient wants to wait -Influenza: Up to  Date  GERD: -Currently on Protonix 40 mg, controls symptoms most of the time  -Had EGD 1/21, which did show gastritis, negative for dysplasia  Hypothyroidism: -Medications: Levothyroxine 50 mcg  -Patient is compliant with the above medication (s) at the above dose and reports no medication side effects.  -Denies weight changes, cold./heat intolerance, skin changes, anxiety/palpitations  -Last TSH: 1.172 9/23 -Follows with Endocrinology, note from 07/18/22 reviewed, seen on annual basis.   Insomnia:  -Currently on Trazodone 100 mg, doing well   Health Maintenance: -Blood work due -Mammogram 8/23 -Colonoscopy 09/20/19 - repeat in 5 years  Review of Systems  Constitutional:  Negative for chills and fever.  Respiratory:  Negative for shortness of breath and wheezing.   Cardiovascular:  Negative for chest pain.  Gastrointestinal:  Positive for abdominal pain. Negative for heartburn, nausea and vomiting.  Neurological:  Negative for headaches.      Objective:     There were no vitals taken for this visit. BP Readings from Last 3 Encounters:  01/10/23 (!) 160/86  01/03/23 124/76  12/12/22 126/78   Wt Readings from Last 3 Encounters:  01/10/23 159 lb 4.8 oz (72.3 kg)  01/03/23 162 lb 11.2 oz (73.8 kg)  12/12/22 161 lb 6.4 oz (73.2 kg)      Physical Exam Constitutional:      Appearance: Normal appearance.  HENT:     Head: Normocephalic and atraumatic.  Cardiovascular:     Rate and Rhythm: Normal rate and regular rhythm.  Pulmonary:     Effort: Pulmonary effort is normal.     Breath sounds: Normal breath sounds.  Musculoskeletal:  Right lower leg: No edema.     Left lower leg: No edema.  Skin:    General: Skin is warm and dry.  Neurological:     General: No focal deficit present.     Mental Status: She is alert. Mental status is at baseline.  Psychiatric:        Mood and Affect: Mood normal.        Behavior: Behavior normal.      No results found for any  visits on 01/31/23.   Last CBC Lab Results  Component Value Date   WBC 4.6 01/10/2023   HGB 10.5 (L) 01/10/2023   HCT 33.8 (L) 01/10/2023   MCV 79.3 (L) 01/10/2023   MCH 24.6 (L) 01/10/2023   RDW 15.0 01/10/2023   PLT 283 01/10/2023   Last metabolic panel Lab Results  Component Value Date   GLUCOSE 95 01/10/2023   NA 142 01/10/2023   K 4.4 01/10/2023   CL 107 01/10/2023   CO2 26 01/10/2023   BUN 18 01/10/2023   CREATININE 1.21 (H) 01/10/2023   EGFR 47 (L) 01/10/2023   CALCIUM 8.9 01/10/2023   PHOS 3.5 02/26/2018   PROT 6.1 01/10/2023   ALBUMIN 3.9 02/08/2021   BILITOT 0.5 01/10/2023   ALKPHOS 99 02/08/2021   AST 13 01/10/2023   ALT 8 01/10/2023   ANIONGAP 12 05/14/2022   Last lipids Lab Results  Component Value Date   CHOL 141 01/03/2023   HDL 61 01/03/2023   LDLCALC 66 01/03/2023   TRIG 64 01/03/2023   CHOLHDL 2.3 01/03/2023   Last hemoglobin A1c Lab Results  Component Value Date   HGBA1C 6.0 (H) 01/03/2023   Last thyroid functions Lab Results  Component Value Date   TSH 1.55 01/10/2023   Last vitamin D Lab Results  Component Value Date   VD25OH 44 01/03/2023   Last vitamin B12 and Folate No results found for: "VITAMINB12", "FOLATE"    The 10-year ASCVD risk score (Arnett DK, et al., 2019) is: 16.1%    Assessment & Plan:   1. Benign essential HTN/Paroxysmal atrial fibrillation [facility]): Chronic and stable. Following with Cardiology, seen last in February. Labs due. Continue current medications which include Amlodipine 5, Losartan 50 mg, Eliquis 5 mg BID. Eliquis refilled.  - CBC w/Diff/Platelet - COMPLETE METABOLIC PANEL WITH GFR - apixaban (ELIQUIS) 5 MG TABS tablet; Take 1 tablet (5 mg total) by mouth 2 (two) times daily.  Dispense: 180 tablet; Refill: 3  2. Hyperlipidemia, mixed: Stable, recheck lipid panel today. Continue Lipitor 10 mg every other day.   - Lipid Profile  3. Mild persistent asthma without complication: Symptoms stable,  continue Breo daily and Albuterol PRN, refilled.  - albuterol (VENTOLIN HFA) 108 (90 Base) MCG/ACT inhaler; Inhale 2 puffs into the lungs every 6 (six) hours as needed for wheezing or shortness of breath.  Dispense: 1 each; Refill: 2 - fluticasone furoate-vilanterol (BREO ELLIPTA) 200-25 MCG/ACT AEPB; Inhale 1 puff into the lungs daily.  Dispense: 1 each; Refill: 11  4. Gastroesophageal reflux disease without esophagitis: Stable, doing well on Protonix 40 mg, refilled.  - pantoprazole (PROTONIX) 40 MG tablet; Take 1 tablet (40 mg total) by mouth daily.  Dispense: 90 tablet; Refill: 1  5. Prediabetes: Recheck A1c today.  - HgB A1c  6. Insomnia, unspecified type: Stable, doing well on Trazodone 100 mg.  7. Vitamin D deficiency: Check Vitamin D level, not currently on supplements.   - Vitamin D (25 hydroxy)   No  follow-ups on file.    Margarita Mail, DO

## 2023-01-31 ENCOUNTER — Ambulatory Visit: Payer: Medicare HMO | Admitting: Internal Medicine

## 2023-02-07 ENCOUNTER — Other Ambulatory Visit: Payer: Self-pay | Admitting: Family Medicine

## 2023-02-07 DIAGNOSIS — K219 Gastro-esophageal reflux disease without esophagitis: Secondary | ICD-10-CM

## 2023-02-09 ENCOUNTER — Encounter: Payer: Self-pay | Admitting: Urology

## 2023-02-09 ENCOUNTER — Ambulatory Visit (INDEPENDENT_AMBULATORY_CARE_PROVIDER_SITE_OTHER): Payer: Medicare HMO | Admitting: Urology

## 2023-02-09 VITALS — BP 181/74 | HR 81 | Ht 65.0 in | Wt 159.0 lb

## 2023-02-09 DIAGNOSIS — R351 Nocturia: Secondary | ICD-10-CM | POA: Diagnosis not present

## 2023-02-09 DIAGNOSIS — N3281 Overactive bladder: Secondary | ICD-10-CM

## 2023-02-09 DIAGNOSIS — R1030 Lower abdominal pain, unspecified: Secondary | ICD-10-CM

## 2023-02-09 LAB — URINALYSIS, COMPLETE
Glucose, UA: NEGATIVE
RBC, UA: NEGATIVE
Urobilinogen, Ur: 0.2 mg/dL (ref 0.2–1.0)

## 2023-02-09 LAB — MICROSCOPIC EXAMINATION

## 2023-02-09 LAB — BLADDER SCAN AMB NON-IMAGING

## 2023-02-09 MED ORDER — GEMTESA 75 MG PO TABS
75.0000 mg | ORAL_TABLET | Freq: Every day | ORAL | 0 refills | Status: DC
Start: 2023-02-09 — End: 2023-03-23

## 2023-02-09 MED ORDER — GEMTESA 75 MG PO TABS
75.0000 mg | ORAL_TABLET | Freq: Every day | ORAL | 0 refills | Status: DC
Start: 2023-02-09 — End: 2023-02-09

## 2023-02-09 NOTE — Progress Notes (Signed)
02/09/23 9:06 AM   Sonya Harper Jul 31, 1946 [id number]  CC: Nocturia, urinary frequency, abdominal pain  HPI: 77 year old female with history of fibromyalgia, depression, A-fib on anticoagulation who presents with 1 to 2 years of nocturia 4 times overnight as well as some urinary frequency during the day, as well as vague lower abdominal pain of unclear etiology.  Urinalysis with PCP in May was benign, she has had extensive imaging including CT abdomen and pelvis in March 2024 and an abdominal ultrasound in May 2024 which were benign.  She drinks mostly water but some tea and soda during the day.  She has been told she snores overnight, never been evaluated for sleep apnea.  PVR today is normal at 0ml.  She denies any gross hematuria or dysuria.  Urinalysis today benign.   PMH: Past Medical History:  Diagnosis Date   A-fib [facility])    Allergy    Arthritis    feet   Asthma    Breast neoplasm    Chronic lower back pain    s/p fall injury   Depression    Dysrhythmia    Fibromyalgia    Hypertension    Hypothyroidism    Lumbar facet arthropathy 11/06/2018   Prediabetes 02/27/2018   Sciatica of right side    Wears dentures    partial upper and lower    Surgical History: Past Surgical History:  Procedure Laterality Date   ABDOMINAL HYSTERECTOMY     CATARACT EXTRACTION W/PHACO Right 02/02/2022   Procedure: CATARACT EXTRACTION PHACO AND INTRAOCULAR LENS PLACEMENT (IOC) RIGHT;  Surgeon: Lockie Mola, MD;  Location: [facility];  Service: Ophthalmology;  Laterality: Right;  4.52 00:45.9   CATARACT EXTRACTION W/PHACO Left 02/16/2022   Procedure: CATARACT EXTRACTION PHACO AND INTRAOCULAR LENS PLACEMENT (IOC) LEFT 7.05 01:19.8;  Surgeon: Lockie Mola, MD;  Location: [facility];  Service: Ophthalmology;  Laterality: Left;   COLONOSCOPY WITH PROPOFOL N/A 05/13/2016   Procedure: COLONOSCOPY WITH PROPOFOL;  Surgeon: Midge Minium, MD;  Location: [facility];  Service: Endoscopy;  Laterality: N/A;   COLONOSCOPY WITH PROPOFOL N/A 09/20/2019   Procedure: COLONOSCOPY WITH PROPOFOL;  Surgeon: Midge Minium, MD;  Location: [facility] END[facility];  Service: Endoscopy;  Laterality: N/A;   ELECTROPHYSIOLOGIC STUDY     Ablation   ESOPHAGOGASTRODUODENOSCOPY (EGD) WITH PROPOFOL N/A 09/20/2019   Procedure: ESOPHAGOGASTRODUODENOSCOPY (EGD) WITH PROPOFOL;  Surgeon: Midge Minium, MD;  Location: [facility] ENDOSCOPY;  Service: Endoscopy;  Laterality: N/A;   POLYPECTOMY N/A 05/13/2016   Procedure: POLYPECTOMY;  Surgeon: Midge Minium, MD;  Location: [facility];  Service: Endoscopy;  Laterality: N/A;      Family History: Family History  Problem Relation Age of Onset   Stroke Mother    Breast cancer Sister 3       breast ca x3   Colon cancer Paternal Aunt    Cancer Sister     Social History:  reports that she has never smoked. She has never been exposed to tobacco smoke. She has never used smokeless tobacco. She reports that she does not drink alcohol and does not use drugs.  Physical Exam: BP (!) 181/74   Pulse 81   Ht 5\' 5"  (1.651 m)   Wt 159 lb (72.1 kg)   BMI 26.46 kg/m    Constitutional:  Alert and oriented, No acute distress. Cardiovascular: No clubbing, cyanosis, or edema. Respiratory: Normal respiratory effort, no increased work of breathing. GI: Abdomen is soft, nontender, nondistended, no abdominal masses   Laboratory  Data: Reviewed, see HPI  Pertinent Imaging: I have personally viewed and interpreted the CT abdomen pelvis from March 2024 that shows no urologic abnormalities.  Assessment & Plan:   77 year old female with fibromyalgia who reports 1 to 2 years of vague lower abdominal pain of unclear etiology with benign imaging, as well as nocturia 4 times per night and some frequency and urgency during the day.  Urinalysis benign, PVR normal.  We discussed that overactive bladder (OAB) is not a disease, but is a symptom complex that  is generally not life-threatening.  Symptoms typically include urinary urgency, frequency, and urge incontinence.  There are numerous treatment options, however there are risks and benefits with both medical and surgical management.  First-line treatment is behavioral therapies including bladder training, pelvic floor muscle training, and fluid management.  Second line treatments include oral antimuscarinics(Ditropan er, Trospium) and beta-3 agonist (Mybetriq). There is typically a period of medication trial (4-8 weeks) to find the optimal therapy and dosing. If symptoms are bothersome despite the above management, third line options include intra-detrusor botox, peripheral tibial nerve stimulation (PTNS), and interstim (SNS). These are more invasive treatments with higher side effect profile, but may improve quality of life for patients with severe OAB symptoms.   -Trial of Gemtesa for OAB symptoms/nocturia -Behavioral strategies discussed at length -Referral placed for sleep apnea evaluation -No urologic cause of chronic lower abdominal pain -RTC 6 weeks PA symptom check on Newt Minion, MD 02/09/2023  [facility] [address], [address] [place], [place] [address] [phone]

## 2023-02-09 NOTE — Patient Instructions (Signed)
Strongly recommend being evaluated for sleep apnea, and this is a very common cause of overnight urination  Avoid tea, carbonated drinks, diet drinks, and sodas, as these can all worsen urinary symptoms  We gave you samples of Gemtesa which is an overactive bladder medication and should help with the frequency and urgency of urination both day and night, this typically takes 2 weeks to start to see a difference

## 2023-02-10 LAB — URINALYSIS, COMPLETE
Bilirubin, UA: NEGATIVE
Ketones, UA: NEGATIVE
Nitrite, UA: NEGATIVE
Protein,UA: NEGATIVE
Specific Gravity, UA: 1.025 (ref 1.005–1.030)
pH, UA: 5.5 (ref 5.0–7.5)

## 2023-02-10 LAB — MICROSCOPIC EXAMINATION

## 2023-02-24 DIAGNOSIS — H40003 Preglaucoma, unspecified, bilateral: Secondary | ICD-10-CM | POA: Diagnosis not present

## 2023-02-24 DIAGNOSIS — H43813 Vitreous degeneration, bilateral: Secondary | ICD-10-CM | POA: Diagnosis not present

## 2023-02-24 DIAGNOSIS — Z961 Presence of intraocular lens: Secondary | ICD-10-CM | POA: Diagnosis not present

## 2023-03-01 ENCOUNTER — Ambulatory Visit: Payer: Medicare HMO | Admitting: Nurse Practitioner

## 2023-03-03 ENCOUNTER — Other Ambulatory Visit: Payer: Self-pay | Admitting: Internal Medicine

## 2023-03-03 DIAGNOSIS — G4700 Insomnia, unspecified: Secondary | ICD-10-CM

## 2023-03-06 NOTE — Telephone Encounter (Signed)
Requested Prescriptions  Pending Prescriptions Disp Refills   traZODone (DESYREL) 100 MG tablet [Pharmacy Med Name: traZODone 100 MG TABLET] 90 tablet 0    Sig: TAKE 1 TABLET BY MOUTH AT BEDTIME     Psychiatry: Antidepressants - Serotonin Modulator Passed - 03/03/2023  6:21 AM      Passed - Valid encounter within last 6 months    Recent Outpatient Visits           1 month ago Lower abdominal pain   [facility] Danelle Berry, PA-C   2 months ago Benign essential HTN   [facility] Margarita Mail, DO   2 months ago Generalized abdominal pain   [facility] Mecum, Oswaldo Conroy, PA-C   3 months ago Generalized abdominal pain   [facility] Margarita Mail, DO   3 months ago Abnormal urine odor    [facility] Margarita Mail, DO       Future Appointments             In 2 weeks Carman Ching, PA-C [facility] [place]   In 4 months Margarita Mail, DO [facility]

## 2023-03-14 ENCOUNTER — Ambulatory Visit (INDEPENDENT_AMBULATORY_CARE_PROVIDER_SITE_OTHER): Payer: Medicare HMO | Admitting: Gastroenterology

## 2023-03-14 ENCOUNTER — Encounter: Payer: Self-pay | Admitting: Gastroenterology

## 2023-03-14 VITALS — BP 151/80 | HR 87 | Temp 98.5°F | Ht 65.0 in | Wt 162.0 lb

## 2023-03-14 DIAGNOSIS — D500 Iron deficiency anemia secondary to blood loss (chronic): Secondary | ICD-10-CM | POA: Diagnosis not present

## 2023-03-14 DIAGNOSIS — D509 Iron deficiency anemia, unspecified: Secondary | ICD-10-CM

## 2023-03-14 DIAGNOSIS — R634 Abnormal weight loss: Secondary | ICD-10-CM | POA: Diagnosis not present

## 2023-03-14 MED ORDER — NA SULFATE-K SULFATE-MG SULF 17.5-3.13-1.6 GM/177ML PO SOLN: 1.0000 | Freq: Once | ORAL | 0 refills | Status: AC

## 2023-03-14 NOTE — Addendum Note (Signed)
Addended by: Roena Malady on: 03/14/2023 02:52 PM   Modules accepted: Orders

## 2023-03-14 NOTE — Addendum Note (Signed)
Addended by: Roena Malady on: 03/14/2023 03:19 PM   Modules accepted: Orders

## 2023-03-14 NOTE — Progress Notes (Signed)
Gastroenterology Consultation  Referring Provider:     Margarita Mail, DO Primary Care Physician:  Margarita Mail, DO Primary Gastroenterologist:  Dr. Servando Snare     Reason for Consultation:     Unintentional weight loss and abdominal pain        HPI:   Sonya Harper is a 77 y.o. y/o female referred for consultation & management of unintentional weight loss and abdominal pain by Dr. Margarita Mail, DO.  This patient comes to see me after being seen by me for an EGD and colonoscopy back in 2021.  At that time the patient had multiple polyps removed and the upper endoscopy was done for an abnormal imaging.  The patient recently had a CT scan and ultrasound.  The ultrasound showed:  IMPRESSION: 1. No cholelithiasis or sonographic evidence for acute cholecystitis. 2. Mild increased hepatic parenchymal echogenicity as can be seen with hepatic steatosis.  While the CT did not show any cause for the patient's symptoms.  The patient has gone from 183 pounds in 2023 to 159 pounds at her last visit with her primary care provider.  The patient was also noted to have iron deficiency.  And her iron studies were sent off that showed:  Component     Latest Ref Rng 01/10/2023  Iron     45 - 160 mcg/dL 18 (L)   TIBC     578 - 450 mcg/dL (calc) 469   %SAT     16 - 45 % (calc) 4 (L)   Ferritin     16 - 288 ng/mL 4 (L)    The patient also had stool cards sent for Hemoccult that were reported negative.  The patient reports that her abdominal pain is worse when she wakes up in the morning and after eating. The patient denies any black stools or bloody stools.  She also reports that her abdominal pain is in lower abdomen.  Past Medical History:  Diagnosis Date   A-fib [facility])    Allergy    Arthritis    feet   Asthma    Breast neoplasm    Chronic lower back pain    s/p fall injury   Depression    Dysrhythmia    Fibromyalgia    Hypertension    Hypothyroidism    Lumbar facet  arthropathy 11/06/2018   Prediabetes 02/27/2018   Sciatica of right side    Wears dentures    partial upper and lower    Past Surgical History:  Procedure Laterality Date   ABDOMINAL HYSTERECTOMY     CATARACT EXTRACTION W/PHACO Right 02/02/2022   Procedure: CATARACT EXTRACTION PHACO AND INTRAOCULAR LENS PLACEMENT (IOC) RIGHT;  Surgeon: Lockie Mola, MD;  Location: [facility];  Service: Ophthalmology;  Laterality: Right;  4.52 00:45.9   CATARACT EXTRACTION W/PHACO Left 02/16/2022   Procedure: CATARACT EXTRACTION PHACO AND INTRAOCULAR LENS PLACEMENT (IOC) LEFT 7.05 01:19.8;  Surgeon: Lockie Mola, MD;  Location: [facility];  Service: Ophthalmology;  Laterality: Left;   COLONOSCOPY WITH PROPOFOL N/A 05/13/2016   Procedure: COLONOSCOPY WITH PROPOFOL;  Surgeon: Midge Minium, MD;  Location: [facility];  Service: Endoscopy;  Laterality: N/A;   COLONOSCOPY WITH PROPOFOL N/A 09/20/2019   Procedure: COLONOSCOPY WITH PROPOFOL;  Surgeon: Midge Minium, MD;  Location: [facility] END[facility];  Service: Endoscopy;  Laterality: N/A;   ELECTROPHYSIOLOGIC STUDY     Ablation   ESOPHAGOGASTRODUODENOSCOPY (EGD) WITH PROPOFOL N/A 09/20/2019   Procedure: ESOPHAGOGASTRODUODENOSCOPY (EGD) WITH PROPOFOL;  Surgeon: Midge Minium,  MD;  Location: [facility] ENDOSCOPY;  Service: Endoscopy;  Laterality: N/A;   POLYPECTOMY N/A 05/13/2016   Procedure: POLYPECTOMY;  Surgeon: Midge Minium, MD;  Location: [facility];  Service: Endoscopy;  Laterality: N/A;    Prior to Admission medications   Medication Sig Start Date End Date Taking? Authorizing Provider  albuterol (VENTOLIN HFA) 108 (90 Base) MCG/ACT inhaler Inhale 2 puffs into the lungs every 6 (six) hours as needed for wheezing or shortness of breath. 01/03/23   Margarita Mail, DO  amLODipine (NORVASC) 5 MG tablet Take 1 tablet (5 mg total) by mouth daily. 11/21/22   Margarita Mail, DO  apixaban (ELIQUIS) 5 MG TABS tablet Take 1  tablet (5 mg total) by mouth 2 (two) times daily. 01/03/23   Margarita Mail, DO  atorvastatin (LIPITOR) 10 MG tablet Take 1 tablet (10 mg total) by mouth every other day. 11/21/22   Margarita Mail, DO  Blood Pressure Monitoring (BLOOD PRESSURE MONITOR/L CUFF) MISC 1 each by Does not apply route daily. 04/14/22   Margarita Mail, DO  Cholecalciferol (VITAMIN D-1000 MAX ST) 25 MCG (1000 UT) tablet Take by mouth.    [provider]  ciclopirox (PENLAC) 8 % solution Apply topically at bedtime. Apply over nail and surrounding skin. Apply daily over previous coat. After seven (7) days, may remove with alcohol and continue cycle. 12/28/21   Candelaria Stagers, DPM  DIPHENHYDRAMINE HCL, TOPICAL, (BENADRYL ITCH STOPPING) 2 % GEL Apply 1 application topically in the morning, at noon, and at bedtime. 02/09/21   Joni Reining, PA-C  EPINEPHrine 0.3 mg/0.3 mL IJ SOAJ injection INJECT INTO THE MIDDLE OF THE OUTER THIGH AND HOLD FOR 10 SECONDS AS NEEDED FOR SEVERE ALLERGIC REACTION THEN CALL 911 IF USED 11/17/22   Margarita Mail, DO  fluticasone [facility]) 50 MCG/ACT nasal spray Place 2 sprays into both nostrils daily. 05/16/22   Margarita Mail, DO  fluticasone furoate-vilanterol (BREO ELLIPTA) 200-25 MCG/ACT AEPB Inhale 1 puff into the lungs daily. 01/03/23   Margarita Mail, DO  loratadine (CLARITIN) 10 MG tablet Take 1 tablet (10 mg total) by mouth daily. 11/21/22   Margarita Mail, DO  losartan (COZAAR) 50 MG tablet Take 1 tablet (50 mg total) by mouth daily. 11/21/22   Margarita Mail, DO  mometasone (ELOCON) 0.1 % cream Apply once daily up to 5 days a week for eczema flares 04/18/22   Deirdre Evener, MD  montelukast (SINGULAIR) 10 MG tablet Take 1 tablet (10 mg total) by mouth at bedtime. 11/01/22   Margarita Mail, DO  mupirocin ointment (BACTROBAN) 2 % Apply 1 Application topically daily. Qd to excision site 07/26/22   Deirdre Evener, MD  nitroGLYCERIN (NITROSTAT) 0.4 MG SL  tablet Place 1 tablet (0.4 mg total) under the tongue every 5 (five) minutes as needed for chest pain. Max 3 pills per episode 08/30/18   Kerman Passey, MD  pantoprazole (PROTONIX) 40 MG tablet TAKE 1 TABLET BY MOUTH 2 TIMES A DAY 02/07/23   Margarita Mail, DO  promethazine-dextromethorphan (PROMETHAZINE-DM) 6.25-15 MG/5ML syrup Take 5 mLs by mouth 3 (three) times daily as needed for cough. 07/11/22   Margarita Mail, DO  SYNTHROID 50 MCG tablet TAKE 1 BY MOUTH DAILY. DO NOT SUBSTITUTE 12/04/14   [provider]  traZODone (DESYREL) 100 MG tablet TAKE 1 TABLET BY MOUTH AT BEDTIME 03/06/23   Margarita Mail, DO  Vibegron (GEMTESA) 75 MG TABS Take 1 tablet (75 mg total) by mouth daily. 02/09/23   Legrand Rams  C, MD    Family History  Problem Relation Age of Onset   Stroke Mother    Breast cancer Sister 50       breast ca x3   Colon cancer Paternal Aunt    Cancer Sister      Social History   Tobacco Use   Smoking status: Never    Passive exposure: Never   Smokeless tobacco: Never  Vaping Use   Vaping Use: Never used  Substance Use Topics   Alcohol use: No    Alcohol/week: 0.0 standard drinks of alcohol   Drug use: Never    Allergies as of 03/14/2023 - Review Complete 03/14/2023  Allergen Reaction Noted   Molds & smuts Anaphylaxis 04/07/2016   Penicillins Nausea And Vomiting and Rash 03/03/2015    Review of Systems:    All systems reviewed and negative except where noted in HPI.   Physical Exam:  BP (!) 164/74 (BP Location: Left Arm, Patient Position: Sitting, Cuff Size: Normal)   Pulse 90   Temp 98.5 F (36.9 C) (Oral)   Ht 5\' 5"  (1.651 m)   Wt 162 lb (73.5 kg)   BMI 26.96 kg/m  No LMP recorded. Patient is postmenopausal. General:   Alert,  Well-developed, well-nourished, pleasant and cooperative in NAD Head:  Normocephalic and atraumatic. Eyes:  Sclera clear, no icterus.   Conjunctiva pink. Ears:  Normal auditory acuity. Neck:  Supple; no masses or  thyromegaly. Lungs:  Respirations even and unlabored.  Clear throughout to auscultation.   No wheezes, crackles, or rhonchi. No acute distress. Heart:  Regular rate and rhythm; no murmurs, clicks, rubs, or gallops. Abdomen:  Normal bowel sounds.  No bruits.  Soft, positive tenderness to 1 finger palpation while flexing the abdominal wall muscles and non-distended without masses, hepatosplenomegaly or hernias noted.  No guarding or rebound tenderness.  Negative Carnett sign.   Rectal:  Deferred.  Pulses:  Normal pulses noted. Extremities:  No clubbing or edema.  No cyanosis. Neurologic:  Alert and oriented x3;  grossly normal neurologically. Skin:  Intact without significant lesions or rashes.  No jaundice. Lymph Nodes:  No significant cervical adenopathy. Psych:  Alert and cooperative. Normal mood and affect.  Imaging Studies: No results found.  Assessment and Plan:   LOTTI ANDLER is a 77 y.o. y/o female with weight loss and anemia which is iron deficiency anemia.  The patient had abdominal pain that is reproducible with flexion of the abdominal wall muscles.  The patient has a sister who is reported to have had colon cancer right out of college and the patient has a history of colon polyps.  The patient will be set up for an EGD and colonoscopy to look for source of her weight loss and anemia.  Of note is the patient has recently gained 3 pounds which the patient has been told is a good sign.  The patient will follow-up at the time of the EGD and colonoscopy.  The patient has been explained the plan agrees with it.    Midge Minium, MD. Clementeen Graham    Note: This dictation was prepared with Dragon dictation along with smaller phrase technology. Any transcriptional errors that result from this process are unintentional.

## 2023-03-15 ENCOUNTER — Other Ambulatory Visit: Payer: Medicare HMO

## 2023-03-15 ENCOUNTER — Telehealth: Payer: Self-pay | Admitting: Gastroenterology

## 2023-03-15 NOTE — Telephone Encounter (Signed)
I spoke to pt and explained that with the upcoming procedures there is a camera given the provider will be able to visualize and be able obtain biopsies if necessary

## 2023-03-15 NOTE — Telephone Encounter (Signed)
Patient called in she said her daughter is wonder can DR. Wohl also schedule for imaging since he is doing colonoscopy.

## 2023-03-22 ENCOUNTER — Telehealth: Payer: Self-pay

## 2023-03-22 NOTE — Telephone Encounter (Signed)
Clearance faxed x 2 to [facility] cardiology

## 2023-03-23 ENCOUNTER — Other Ambulatory Visit: Payer: Self-pay | Admitting: Urology

## 2023-03-23 DIAGNOSIS — N3281 Overactive bladder: Secondary | ICD-10-CM

## 2023-03-24 ENCOUNTER — Ambulatory Visit: Payer: Medicare HMO | Admitting: Physician Assistant

## 2023-03-28 NOTE — Telephone Encounter (Signed)
Faxed x 2

## 2023-03-28 NOTE — Telephone Encounter (Signed)
I spoke to [place] and she stated that she will reach out to the team of Dr Alden Hipp to see if they have received clearance needed ASAP  Clearance faxed x 2

## 2023-03-31 NOTE — Telephone Encounter (Signed)
I spoke to pt to let her know that we still have not received the clearance for upcoming procedure... I have called cardiology numerous times this week to check on the clearance and I still do not have it along with when pt needs to stop and restart blood thinner... Pt said she will try to call today as well... I will check with pt on Monday

## 2023-04-03 NOTE — Telephone Encounter (Signed)
Received fax from Cardiology clearing pt and advising pt to hold Eliquis 2 days prior to procedure and restart 1 day after.... Sent to be scanned   Called pt, no answer and did not leave VM.Marland KitchenMarland Kitchen I will try again later

## 2023-04-03 NOTE — Telephone Encounter (Signed)
I called [facility] cardiology to see if we will be getting the clearance and instructions for Eliquis  I also called pt and let her know as well... Pt had already taken Eliquis this morning as well  Pt called Friday to inquire about clearance and has not received a call back

## 2023-04-04 NOTE — Telephone Encounter (Signed)
Patient called wanting to know about her blood thinner medication transfer to [facility]

## 2023-04-04 NOTE — Telephone Encounter (Signed)
Pt is aware to hold Eliquis starting today and to restart 1 day after

## 2023-04-06 ENCOUNTER — Encounter: Payer: Self-pay | Admitting: Gastroenterology

## 2023-04-06 ENCOUNTER — Ambulatory Visit
Admission: RE | Admit: 2023-04-06 | Discharge: 2023-04-06 | Disposition: A | Discharge status: Home / Self Care | Payer: Medicare HMO | Attending: Gastroenterology | Admitting: Gastroenterology

## 2023-04-06 ENCOUNTER — Ambulatory Visit: Payer: Medicare HMO | Admitting: Anesthesiology

## 2023-04-06 ENCOUNTER — Other Ambulatory Visit: Payer: Self-pay

## 2023-04-06 ENCOUNTER — Encounter
Admission: RE | Disposition: A | Discharge status: Home / Self Care | Payer: Self-pay | Source: Home / Self Care | Attending: Gastroenterology

## 2023-04-06 DIAGNOSIS — K635 Polyp of colon: Secondary | ICD-10-CM

## 2023-04-06 DIAGNOSIS — I209 Angina pectoris, unspecified: Secondary | ICD-10-CM | POA: Diagnosis not present

## 2023-04-06 DIAGNOSIS — J45909 Unspecified asthma, uncomplicated: Secondary | ICD-10-CM | POA: Diagnosis not present

## 2023-04-06 DIAGNOSIS — Z79899 Other long term (current) drug therapy: Secondary | ICD-10-CM | POA: Diagnosis not present

## 2023-04-06 DIAGNOSIS — K5730 Diverticulosis of large intestine without perforation or abscess without bleeding: Secondary | ICD-10-CM | POA: Diagnosis not present

## 2023-04-06 DIAGNOSIS — D509 Iron deficiency anemia, unspecified: Secondary | ICD-10-CM | POA: Diagnosis not present

## 2023-04-06 DIAGNOSIS — R634 Abnormal weight loss: Secondary | ICD-10-CM | POA: Insufficient documentation

## 2023-04-06 DIAGNOSIS — F32A Depression, unspecified: Secondary | ICD-10-CM | POA: Insufficient documentation

## 2023-04-06 DIAGNOSIS — I10 Essential (primary) hypertension: Secondary | ICD-10-CM | POA: Insufficient documentation

## 2023-04-06 DIAGNOSIS — D124 Benign neoplasm of descending colon: Secondary | ICD-10-CM | POA: Diagnosis not present

## 2023-04-06 DIAGNOSIS — I4891 Unspecified atrial fibrillation: Secondary | ICD-10-CM | POA: Insufficient documentation

## 2023-04-06 DIAGNOSIS — Z6825 Body mass index (BMI) 25.0-25.9, adult: Secondary | ICD-10-CM | POA: Diagnosis not present

## 2023-04-06 DIAGNOSIS — I480 Paroxysmal atrial fibrillation: Secondary | ICD-10-CM | POA: Diagnosis not present

## 2023-04-06 DIAGNOSIS — K219 Gastro-esophageal reflux disease without esophagitis: Secondary | ICD-10-CM | POA: Diagnosis not present

## 2023-04-06 DIAGNOSIS — E039 Hypothyroidism, unspecified: Secondary | ICD-10-CM | POA: Insufficient documentation

## 2023-04-06 DIAGNOSIS — K641 Second degree hemorrhoids: Secondary | ICD-10-CM | POA: Diagnosis not present

## 2023-04-06 HISTORY — PX: COLONOSCOPY WITH PROPOFOL: SHX5780

## 2023-04-06 HISTORY — PX: BIOPSY: SHX5522

## 2023-04-06 HISTORY — PX: ESOPHAGOGASTRODUODENOSCOPY (EGD) WITH PROPOFOL: SHX5813

## 2023-04-06 HISTORY — PX: POLYPECTOMY: SHX5525

## 2023-04-06 SURGERY — COLONOSCOPY WITH PROPOFOL
Anesthesia: General

## 2023-04-06 MED ORDER — LIDOCAINE HCL (PF) 2 % IJ SOLN
INTRAMUSCULAR | Status: AC
  Filled 2023-04-06: qty 20

## 2023-04-06 MED ORDER — PROPOFOL 10 MG/ML IV BOLUS
INTRAVENOUS | Status: AC
  Filled 2023-04-06: qty 40

## 2023-04-06 MED ORDER — PROPOFOL 10 MG/ML IV BOLUS
INTRAVENOUS | Status: DC | PRN
  Administered 2023-04-06: 40 mg via INTRAVENOUS
  Administered 2023-04-06: 100 mg via INTRAVENOUS
  Administered 2023-04-06 (×4): 40 mg via INTRAVENOUS
  Administered 2023-04-06: 60 mg via INTRAVENOUS
  Administered 2023-04-06: 40 mg via INTRAVENOUS

## 2023-04-06 MED ORDER — PROPOFOL 1000 MG/100ML IV EMUL
INTRAVENOUS | Status: AC
  Filled 2023-04-06: qty 200

## 2023-04-06 MED ORDER — SODIUM CHLORIDE 0.9 % IV SOLN: INTRAVENOUS | Status: DC

## 2023-04-06 MED ORDER — LIDOCAINE HCL (CARDIAC) PF 100 MG/5ML IV SOSY
PREFILLED_SYRINGE | INTRAVENOUS | Status: DC | PRN
  Administered 2023-04-06: 100 mg via INTRAVENOUS

## 2023-04-06 MED ORDER — PHENYLEPHRINE 80 MCG/ML (10ML) SYRINGE FOR IV PUSH (FOR BLOOD PRESSURE SUPPORT)
PREFILLED_SYRINGE | INTRAVENOUS | Status: AC
  Filled 2023-04-06: qty 10

## 2023-04-06 MED ORDER — STERILE WATER FOR IRRIGATION IR SOLN
Status: DC | PRN
  Administered 2023-04-06: 120 mL

## 2023-04-06 NOTE — Op Note (Signed)
[facility] Patient Name: Sonya Harper Procedure Date: 04/06/2023 6:57 AM MRN: [id number] Account #: [id number] Date of Birth: 08-29-1946 Admit Type: Outpatient Age: 77 Room: [facility] ENDO ROOM 4 Gender: Female Note Status: Finalized Instrument Name: Upper Endoscope [id number] Procedure:             Upper GI endoscopy Indications:           Iron deficiency anemia Providers:             Midge Minium MD, MD Referring MD:          No Local Md, MD (Referring MD) Medicines:             Propofol per Anesthesia Complications:         No immediate complications. Procedure:             Pre-Anesthesia Assessment:                        - Prior to the procedure, a History and Physical was                         performed, and patient medications and allergies were                         reviewed. The patient's tolerance of previous                         anesthesia was also reviewed. The risks and benefits                         of the procedure and the sedation options and risks                         were discussed with the patient. All questions were                         answered, and informed consent was obtained. Prior                         Anticoagulants: The patient has taken no anticoagulant                         or antiplatelet agents. ASA Grade Assessment: II - A                         patient with mild systemic disease. After reviewing                         the risks and benefits, the patient was deemed in                         satisfactory condition to undergo the procedure.                        After obtaining informed consent, the endoscope was                         passed under direct vision. Throughout the procedure,  the patient's blood pressure, pulse, and oxygen                         saturations were monitored continuously. The Endoscope                         was introduced through the mouth, and  advanced to the                         second part of duodenum. The upper GI endoscopy was                         accomplished without difficulty. The patient tolerated                         the procedure well. Findings:      The examined esophagus was normal.      The stomach was normal.      The examined duodenum was normal. Biopsies for histology were taken with       a cold forceps for evaluation of celiac disease. Impression:            - Normal esophagus.                        - Normal stomach.                        - Normal examined duodenum. Biopsied. Recommendation:        - Discharge patient to home.                        - Resume previous diet.                        - Continue present medications.                        - Perform a colonoscopy today. Procedure Code(s):     --- Professional ---                        [phone], Esophagogastroduodenoscopy, flexible,                         transoral; with biopsy, single or multiple Diagnosis Code(s):     --- Professional ---                        D50.9, Iron deficiency anemia, unspecified CPT copyright 2022 American Medical Association. All rights reserved. The codes documented in this report are preliminary and upon coder review may  be revised to meet current compliance requirements. Midge Minium MD, MD 04/06/2023 7:45:54 AM This report has been signed electronically. Number of Addenda: 0 Note Initiated On: 04/06/2023 6:57 AM Estimated Blood Loss:  Estimated blood loss: none.      [facility]

## 2023-04-06 NOTE — Op Note (Signed)
[facility] Gastroenterology Patient Name: Sonya Harper Procedure Date: 04/06/2023 6:58 AM MRN: [id number] Account #: [id number] Date of Birth: [birth year] Admit Type: Outpatient Age: 77 Room: [facility] ENDO ROOM 4 Gender: Female Note Status: Finalized Instrument Name: Prentice Docker [id number] Procedure:             Colonoscopy Indications:           Iron deficiency anemia, Weight loss Providers:             Midge Minium MD, MD Referring MD:          No Local Md, MD (Referring MD) Medicines:             Propofol per Anesthesia Complications:         No immediate complications. Procedure:             Pre-Anesthesia Assessment:                        - Prior to the procedure, a History and Physical was                         performed, and patient medications and allergies were                         reviewed. The patient's tolerance of previous                         anesthesia was also reviewed. The risks and benefits                         of the procedure and the sedation options and risks                         were discussed with the patient. All questions were                         answered, and informed consent was obtained. Prior                         Anticoagulants: The patient has taken no anticoagulant                         or antiplatelet agents. ASA Grade Assessment: II - A                         patient with mild systemic disease. After reviewing                         the risks and benefits, the patient was deemed in                         satisfactory condition to undergo the procedure.                        After obtaining informed consent, the colonoscope was                         passed under direct vision. Throughout the procedure,  the patient's blood pressure, pulse, and oxygen                         saturations were monitored continuously. The                         Colonoscope was introduced through the anus  and                         advanced to the the cecum, identified by appendiceal                         orifice and ileocecal valve. The colonoscopy was                         performed without difficulty. The patient tolerated                         the procedure well. The quality of the bowel                         preparation was excellent. Findings:      The perianal and digital rectal examinations were normal.      Two sessile polyps were found in the descending colon. The polyps were 3       to 4 mm in size. These polyps were removed with a cold snare. Resection       and retrieval were complete.      Three sessile polyps were found in the sigmoid colon. The polyps were 3       to 4 mm in size. These polyps were removed with a cold snare. Resection       and retrieval were complete.      Multiple small-mouthed diverticula were found in the sigmoid colon.      Non-bleeding internal hemorrhoids were found during retroflexion. The       hemorrhoids were Grade II (internal hemorrhoids that prolapse but reduce       spontaneously). Impression:            - Two 3 to 4 mm polyps in the descending colon,                         removed with a cold snare. Resected and retrieved.                        - Three 3 to 4 mm polyps in the sigmoid colon, removed                         with a cold snare. Resected and retrieved.                        - Diverticulosis in the sigmoid colon.                        - Non-bleeding internal hemorrhoids. Recommendation:        - Discharge patient to home.                        - Resume previous diet.                        -  Continue present medications.                        - Await pathology results.                        - If the pathology report reveals adenomatous tissue,                         then repeat the colonoscopy for surveillance in 3                         years.                        - To visualize the small bowel, perform  video capsule                         endoscopy at appointment to be scheduled. Procedure Code(s):     --- Professional ---                        [phone], Colonoscopy, flexible; with removal of                         tumor(s), polyp(s), or other lesion(s) by snare                         technique CPT copyright 2022 American [employer] Association. All rights reserved. The codes documented in this report are preliminary and upon coder review may  be revised to meet current compliance requirements. Midge Minium MD, MD 04/06/2023 8:07:44 AM This report has been signed electronically. Number of Addenda: 0 Note Initiated On: 04/06/2023 6:58 AM Scope Withdrawal Time: 0 hours 14 minutes 38 seconds  Total Procedure Duration: 0 hours 17 minutes 49 seconds  Estimated Blood Loss:  Estimated blood loss: none.      [facility]

## 2023-04-06 NOTE — Anesthesia Preprocedure Evaluation (Signed)
Anesthesia Evaluation  Patient identified by MRN, date of birth, ID band Patient awake    Reviewed: Allergy & Precautions, H&P , NPO status , Patient's Chart, lab work & pertinent test results, reviewed documented beta blocker date and time   Airway Mallampati: II   Neck ROM: full    Dental  (+) Poor Dentition   Pulmonary asthma    Pulmonary exam normal        Cardiovascular Exercise Tolerance: Poor hypertension, On Medications + angina with exertion + dysrhythmias Atrial Fibrillation  Rhythm:regular Rate:Normal     Neuro/Psych  PSYCHIATRIC DISORDERS  Depression     Neuromuscular disease    GI/Hepatic Neg liver ROS,GERD  Medicated,,  Endo/Other  Hypothyroidism    Renal/GU Renal disease  negative genitourinary   Musculoskeletal   Abdominal   Peds  Hematology negative hematology ROS (+)   Anesthesia Other Findings Past Medical History: No date: A-fib (HCC) No date: Allergy No date: Arthritis     Comment:  feet No date: Asthma No date: Breast neoplasm No date: Chronic lower back pain     Comment:  s/p fall injury No date: Depression No date: Dysrhythmia No date: Fibromyalgia No date: Hypertension No date: Hypothyroidism 11/06/2018: Lumbar facet arthropathy 02/27/2018: Prediabetes No date: Sciatica of right side No date: Wears dentures     Comment:  partial upper and lower Past Surgical History: No date: ABDOMINAL HYSTERECTOMY 02/02/2022: CATARACT EXTRACTION W/PHACO; Right     Comment:  Procedure: CATARACT EXTRACTION PHACO AND INTRAOCULAR               LENS PLACEMENT (IOC) RIGHT;  Surgeon: Lockie Mola, MD;  Location: [facility]ERY [facility];  Service:               Ophthalmology;  Laterality: Right;  4.52 00:45.9 02/16/2022: CATARACT EXTRACTION W/PHACO; Left     Comment:  Procedure: CATARACT EXTRACTION PHACO AND INTRAOCULAR               LENS PLACEMENT (IOC) LEFT 7.05 01:19.8;  Surgeon:                Lockie Mola, MD;  Location: [facility] SURGERY [facility];              Service: Ophthalmology;  Laterality: Left; 05/13/2016: COLONOSCOPY WITH PROPOFOL; N/A     Comment:  Procedure: COLONOSCOPY WITH PROPOFOL;  Surgeon: Midge Minium, MD;  Location: [facility] SURGERY [facility];  Service:               Endoscopy;  Laterality: N/A; 09/20/2019: COLONOSCOPY WITH PROPOFOL; N/A     Comment:  Procedure: COLONOSCOPY WITH PROPOFOL;  Surgeon: Midge Minium, MD;  Location: [facility] ENDOSCOPY;  Service:               Endoscopy;  Laterality: N/A; No date: ELECTROPHYSIOLOGIC STUDY     Comment:  Ablation 09/20/2019: ESOPHAGOGASTRODUODENOSCOPY (EGD) WITH PROPOFOL; N/A     Comment:  Procedure: ESOPHAGOGASTRODUODENOSCOPY (EGD) WITH               PROPOFOL;  Surgeon: Midge Minium, MD;  Location: [facility]               ENDOSCOPY;  Service: Endoscopy;  Laterality: N/A; 05/13/2016: POLYPECTOMY; N/A     Comment:  Procedure: POLYPECTOMY;  Surgeon: Midge Minium, MD;                Location: [facility] SURGERY CNTR;  Service: Endoscopy;                Laterality: N/A; BMI    Body Mass Index: 25.43 kg/m     Reproductive/Obstetrics negative OB ROS                             Anesthesia Physical Anesthesia Plan  ASA: 3  Anesthesia Plan: General   Post-op Pain Management:    Induction:   PONV Risk Score and Plan:   Airway Management Planned:   Additional Equipment:   Intra-op Plan:   Post-operative Plan:   Informed Consent: I have reviewed the patients History and Physical, chart, labs and discussed the procedure including the risks, benefits and alternatives for the proposed anesthesia with the patient or authorized representative who has indicated his/her understanding and acceptance.     Dental Advisory Given  Plan Discussed with: CRNA  Anesthesia Plan Comments:        Anesthesia Quick Evaluation

## 2023-04-06 NOTE — Transfer of Care (Signed)
Immediate Anesthesia Transfer of Care Note  Patient: AMABELLE WARDLE  Procedure(s) Performed: COLONOSCOPY WITH PROPOFOL ESOPHAGOGASTRODUODENOSCOPY (EGD) WITH PROPOFOL BIOPSY POLYPECTOMY  Patient Location: Endoscopy Unit  Anesthesia Type:General  Level of Consciousness: drowsy  Airway & Oxygen Therapy: Patient Spontanous Breathing and Patient connected to nasal cannula oxygen  Post-op Assessment: Report given to RN, Post -op Vital signs reviewed and stable, and Patient moving all extremities  Post vital signs: Reviewed and stable  Last Vitals:  Vitals Value Taken Time  BP 110/61 04/06/23 0809  Temp 35.6 C 04/06/23 0809  Pulse 75 04/06/23 0809  Resp 24 04/06/23 0809  SpO2 97 % 04/06/23 0809  Vitals shown include unfiled device data.  Last Pain:  Vitals:   04/06/23 0809  TempSrc: Temporal  PainSc:          Complications: No notable events documented.

## 2023-04-06 NOTE — H&P (Signed)
Midge Minium, MD [facility] [address], [address] [place], [place] [address] Phone:[phone] Fax : [phone]  Primary Care Physician:  Margarita Mail, DO Primary Gastroenterologist:  Dr. Servando Snare  Pre-Procedure History & Physical: HPI:  Sonya Harper is a 77 y.o. female is here for an endoscopy and colonoscopy.   Past Medical History:  Diagnosis Date   A-fib [facility])    Allergy    Arthritis    feet   Asthma    Breast neoplasm    Chronic lower back pain    s/p fall injury   Depression    Dysrhythmia    Fibromyalgia    Hypertension    Hypothyroidism    Lumbar facet arthropathy 11/06/2018   Prediabetes 02/27/2018   Sciatica of right side    Wears dentures    partial upper and lower    Past Surgical History:  Procedure Laterality Date   ABDOMINAL HYSTERECTOMY     CATARACT EXTRACTION W/PHACO Right 02/02/2022   Procedure: CATARACT EXTRACTION PHACO AND INTRAOCULAR LENS PLACEMENT (IOC) RIGHT;  Surgeon: Lockie Mola, MD;  Location: [facility] SURGERY CNTR;  Service: Ophthalmology;  Laterality: Right;  4.52 00:45.9   CATARACT EXTRACTION W/PHACO Left 02/16/2022   Procedure: CATARACT EXTRACTION PHACO AND INTRAOCULAR LENS PLACEMENT (IOC) LEFT 7.05 01:19.8;  Surgeon: Lockie Mola, MD;  Location: [facility] SURGERY [facility];  Service: Ophthalmology;  Laterality: Left;   COLONOSCOPY WITH PROPOFOL N/A 05/13/2016   Procedure: COLONOSCOPY WITH PROPOFOL;  Surgeon: Midge Minium, MD;  Location: [facility] SURGERY [facility];  Service: Endoscopy;  Laterality: N/A;   COLONOSCOPY WITH PROPOFOL N/A 09/20/2019   Procedure: COLONOSCOPY WITH PROPOFOL;  Surgeon: Midge Minium, MD;  Location: [facility] ENDOSCOPY;  Service: Endoscopy;  Laterality: N/A;   ELECTROPHYSIOLOGIC STUDY     Ablation   ESOPHAGOGASTRODUODENOSCOPY (EGD) WITH PROPOFOL N/A 09/20/2019   Procedure: ESOPHAGOGASTRODUODENOSCOPY (EGD) WITH PROPOFOL;  Surgeon: Midge Minium, MD;  Location: [facility] ENDOSCOPY;  Service: Endoscopy;  Laterality: N/A;    POLYPECTOMY N/A 05/13/2016   Procedure: POLYPECTOMY;  Surgeon: Midge Minium, MD;  Location: [facility] SUR[facility]ERY [facility];  Service: Endoscopy;  Laterality: N/A;    Prior to Admission medications   Medication Sig Start Date End Date Taking? Authorizing Provider  albuterol (VENTOLIN HFA) 108 (90 Base) MCG/ACT inhaler Inhale 2 puffs into the lungs every 6 (six) hours as needed for wheezing or shortness of breath. 01/03/23  Yes Margarita Mail, DO  amLODipine (NORVASC) 5 MG tablet Take 1 tablet (5 mg total) by mouth daily. 11/21/22  Yes Margarita Mail, DO  atorvastatin (LIPITOR) 10 MG tablet Take 1 tablet (10 mg total) by mouth every other day. 11/21/22  Yes Margarita Mail, DO  Cholecalciferol (VITAMIN D-1000 MAX ST) 25 MCG (1000 UT) tablet Take by mouth.   Yes [provider]  fluticasone furoate-vilanterol (BREO ELLIPTA) 200-25 MCG/ACT AEPB Inhale 1 puff into the lungs daily. 01/03/23  Yes Margarita Mail, DO  losartan (COZAAR) 50 MG tablet Take 1 tablet (50 mg total) by mouth daily. 11/21/22  Yes Margarita Mail, DO  pantoprazole (PROTONIX) 40 MG tablet TAKE 1 TABLET BY MOUTH 2 TIMES A DAY 02/07/23  Yes Margarita Mail, DO  SYNTHROID 50 MCG tablet TAKE 1 BY MOUTH DAILY. DO NOT SUBSTITUTE 12/04/14  Yes [provider]  apixaban (ELIQUIS) 5 MG TABS tablet Take 1 tablet (5 mg total) by mouth 2 (two) times daily. 01/03/23   Margarita Mail, DO  Blood Pressure Monitoring (BLOOD PRESSURE MONITOR/L CUFF) MISC 1 each by Does not apply route daily. 04/14/22  Margarita Mail, DO  DIPHENHYDRAMINE HCL, TOPICAL, (BENADRYL ITCH STOPPING) 2 % GEL Apply 1 application topically in the morning, at noon, and at bedtime. 02/09/21   Joni Reining, PA-C  EPINEPHrine 0.3 mg/0.3 mL IJ SOAJ injection INJECT INTO THE MIDDLE OF THE OUTER THIGH AND HOLD FOR 10 SECONDS AS NEEDED FOR SEVERE ALLERGIC REACTION THEN CALL 911 IF USED 11/17/22   Margarita Mail, DO  fluticasone [facility]) 50 MCG/ACT nasal  spray Place 2 sprays into both nostrils daily. 05/16/22   Margarita Mail, DO  loratadine (CLARITIN) 10 MG tablet Take 1 tablet (10 mg total) by mouth daily. 11/21/22   Margarita Mail, DO  mometasone (ELOCON) 0.1 % cream Apply once daily up to 5 days a week for eczema flares 04/18/22   Deirdre Evener, MD  montelukast (SINGULAIR) 10 MG tablet Take 1 tablet (10 mg total) by mouth at bedtime. 11/01/22   Margarita Mail, DO  mupirocin ointment (BACTROBAN) 2 % Apply 1 Application topically daily. Qd to excision site 07/26/22   Deirdre Evener, MD  nitroGLYCERIN (NITROSTAT) 0.4 MG SL tablet Place 1 tablet (0.4 mg total) under the tongue every 5 (five) minutes as needed for chest pain. Max 3 pills per episode 08/30/18   Kerman Passey, MD  traZODone (DESYREL) 100 MG tablet TAKE 1 TABLET BY MOUTH AT BEDTIME 03/06/23   Margarita Mail, DO  Vibegron (GEMTESA) 75 MG TABS TAKE 1 TABLET BY MOUTH DAILY 03/23/23   Sondra Come, MD    Allergies as of 03/14/2023 - Review Complete 03/14/2023  Allergen Reaction Noted   Molds & smuts Anaphylaxis 04/07/2016   Penicillins Nausea And Vomiting and Rash 03/03/2015    Family History  Problem Relation Age of Onset   Stroke Mother    Breast cancer Sister 43       breast ca x3   Colon cancer Paternal Aunt    Cancer Sister     Social History   Socioeconomic History   Marital status: Married    Spouse name: Not on file   Number of children: Not on file   Years of education: Not on file   Highest education level: Not on file  Occupational History   Not on file  Tobacco Use   Smoking status: Never    Passive exposure: Never   Smokeless tobacco: Never  Vaping Use   Vaping status: Never Used  Substance and Sexual Activity   Alcohol use: No    Alcohol/week: 0.0 standard drinks of alcohol   Drug use: Never   Sexual activity: Not Currently    Birth control/protection: Post-menopausal  Other Topics Concern   Not on file  Social History  Narrative   Not on file   Social Determinants of Health   Financial Resource Strain: Not on file  Food Insecurity: Not on file  Transportation Needs: Not on file  Physical Activity: Not on file  Stress: Not on file  Social Connections: Not on file  Intimate Partner Violence: Not on file    Review of Systems: See HPI, otherwise negative ROS  Physical Exam: BP 136/79   Pulse 92   Temp (!) 96.8 F (36 C) (Temporal)   Resp 18   Ht 5\' 5"  (1.651 m)   Wt 69.3 kg   SpO2 99%   BMI 25.43 kg/m  General:   Alert,  pleasant and cooperative in NAD Head:  Normocephalic and atraumatic. Neck:  Supple; no masses or thyromegaly. Lungs:  Clear throughout to auscultation.  Heart:  Regular rate and rhythm. Abdomen:  Soft, nontender and nondistended. Normal bowel sounds, without guarding, and without rebound.   Neurologic:  Alert and  oriented x4;  grossly normal neurologically.  Impression/Plan: Sonya Harper is here for an endoscopy and colonoscopy to be performed for low iron anemia and weight loss  Risks, benefits, limitations, and alternatives regarding  endoscopy and colonoscopy have been reviewed with the patient.  Questions have been answered.  All parties agreeable.   Midge Minium, MD  04/06/2023, 7:19 AM

## 2023-04-06 NOTE — Anesthesia Postprocedure Evaluation (Signed)
Anesthesia Post Note  Patient: SHAMELLA MOGLIA  Procedure(s) Performed: COLONOSCOPY WITH PROPOFOL ESOPHAGOGASTRODUODENOSCOPY (EGD) WITH PROPOFOL BIOPSY POLYPECTOMY  Patient location during evaluation: PACU Anesthesia Type: General Level of consciousness: awake and alert Pain management: pain level controlled Vital Signs Assessment: post-procedure vital signs reviewed and stable Respiratory status: spontaneous breathing, nonlabored ventilation, respiratory function stable and patient connected to nasal cannula oxygen Cardiovascular status: blood pressure returned to baseline and stable Postop Assessment: no apparent nausea or vomiting Anesthetic complications: no   No notable events documented.   Last Vitals:  Vitals:   04/06/23 0819 04/06/23 0829  BP: 121/73 127/62  Pulse: 82 76  Resp: 18 14  Temp:    SpO2: 100% 99%    Last Pain:  Vitals:   04/06/23 0829  TempSrc:   PainSc: 0-No pain                 Yevette Edwards

## 2023-04-07 ENCOUNTER — Encounter: Payer: Self-pay | Admitting: Gastroenterology

## 2023-04-09 ENCOUNTER — Encounter: Payer: Self-pay | Admitting: Gastroenterology

## 2023-04-10 ENCOUNTER — Ambulatory Visit: Payer: Medicare HMO | Admitting: Physician Assistant

## 2023-04-12 ENCOUNTER — Other Ambulatory Visit: Payer: Self-pay | Admitting: Internal Medicine

## 2023-04-12 ENCOUNTER — Telehealth: Payer: Self-pay

## 2023-04-12 DIAGNOSIS — Z1231 Encounter for screening mammogram for malignant neoplasm of breast: Secondary | ICD-10-CM

## 2023-04-12 NOTE — Telephone Encounter (Signed)
Pt lmovm stating that you told her that she may need something additional ordered following her procedures last week... I did not see anything noted about anything needed...  Please advise

## 2023-04-14 DIAGNOSIS — D509 Iron deficiency anemia, unspecified: Secondary | ICD-10-CM

## 2023-04-14 NOTE — Telephone Encounter (Signed)
Capsule study schedule for 8/27 and PPW released to Mychart and mailed to pt

## 2023-04-20 ENCOUNTER — Ambulatory Visit: Payer: Self-pay | Admitting: *Deleted

## 2023-04-20 NOTE — Telephone Encounter (Signed)
Summary: feels tired.   Pt called in to schedule her follow up appt for blood work with provider. Assisted pt with scheduling next available appt (Monday 8/19). Pt then says that she would like to speak with a nurse. Pt says that she has been seeing PCP because she is losing blood internally and they are working to figure out where/why.  Pt says that she feels tired today and would just like to speak with a nurse to make aware and to be advised.  Please assist pt further.       Chief Complaint: Fatigued Symptoms: increased fatigue Frequency: weeks Pertinent Negatives: Patient denies  Disposition: [] ED /[] Urgent Care (no appt availability in office) / [x] Appointment(In office/virtual)/ []  [facility]/ [] Home Care/ [] Refused Recommended Disposition /[] [facility]/ []  Follow-up with PCP Additional Notes: Pt called to schedule F/U labs, agent secured appt for [facility] 04/24/23. Pt states she just wanted a nurse to know she is fatigued and was told she may be bleeding after her colonoscopy 04/06/23. States can go about activities of daily living but has to rest often. No SOB. Assured pt NT would route to practice for PCPs review, if wants pt to come in earlier for labs. Pt states "Oh I'll be ok until then, just doing too much.   Reason for Disposition  [1] Fatigue (i.e., tires easily, decreased energy) AND [2] persists > 1 week  Answer Assessment - Initial Assessment Questions 1. DESCRIPTION: "Describe how you are feeling."     More fatigued 2. SEVERITY: "How bad is it?"  "Can you stand and walk?"   - MILD (0-3): Feels weak or tired, but does not interfere with work, school or normal activities.   - MODERATE (4-7): Able to stand and walk; weakness interferes with work, school, or normal activities.   - SEVERE (8-10): Unable to stand or walk; unable to do usual activities.     Moderate 3. ONSET: "When did these symptoms begin?" (e.g., hours, days, weeks, months)     Weeks ago 4.  CAUSE: "What do you think is causing the weakness or fatigue?" (e.g., not drinking enough fluids, medical problem, trouble sleeping)     "Told I was losing blood."   5. NEW MEDICINES:  "Have you started on any new medicines recently?" (e.g., opioid pain medicines, benzodiazepines, muscle relaxants, antidepressants, antihistamines, neuroleptics, beta blockers)     no 6. OTHER SYMPTOMS: "Do you have any other symptoms?" (e.g., chest pain, fever, cough, SOB, vomiting, diarrhea, bleeding, other areas of pain)     Napping a lot  Protocols used: Weakness (Generalized) and Fatigue-A-AH

## 2023-04-21 ENCOUNTER — Telehealth: Payer: Self-pay

## 2023-04-21 NOTE — Telephone Encounter (Signed)
Incoming fax states that pt has canceled sleep study and did not wish to reschedule.

## 2023-04-21 NOTE — Telephone Encounter (Signed)
Pt notified, we will see her next week, if she feels really bad she will need to go to ER or UC since we are booked and provider out tof the office.

## 2023-04-24 ENCOUNTER — Encounter: Payer: Self-pay | Admitting: Family Medicine

## 2023-04-24 ENCOUNTER — Ambulatory Visit (INDEPENDENT_AMBULATORY_CARE_PROVIDER_SITE_OTHER): Payer: Medicare HMO | Admitting: Family Medicine

## 2023-04-24 VITALS — BP 150/74 | HR 78 | Temp 97.9°F | Resp 16 | Ht 65.0 in | Wt 161.9 lb

## 2023-04-24 DIAGNOSIS — N179 Acute kidney failure, unspecified: Secondary | ICD-10-CM | POA: Diagnosis not present

## 2023-04-24 DIAGNOSIS — K219 Gastro-esophageal reflux disease without esophagitis: Secondary | ICD-10-CM | POA: Diagnosis not present

## 2023-04-24 DIAGNOSIS — R5383 Other fatigue: Secondary | ICD-10-CM

## 2023-04-24 DIAGNOSIS — Z5181 Encounter for therapeutic drug level monitoring: Secondary | ICD-10-CM

## 2023-04-24 DIAGNOSIS — D509 Iron deficiency anemia, unspecified: Secondary | ICD-10-CM | POA: Diagnosis not present

## 2023-04-24 DIAGNOSIS — I10 Essential (primary) hypertension: Secondary | ICD-10-CM | POA: Diagnosis not present

## 2023-04-24 DIAGNOSIS — E039 Hypothyroidism, unspecified: Secondary | ICD-10-CM | POA: Diagnosis not present

## 2023-04-24 DIAGNOSIS — E559 Vitamin D deficiency, unspecified: Secondary | ICD-10-CM

## 2023-04-24 DIAGNOSIS — R634 Abnormal weight loss: Secondary | ICD-10-CM | POA: Diagnosis not present

## 2023-04-24 MED ORDER — LOSARTAN POTASSIUM 100 MG PO TABS
100.0000 mg | ORAL_TABLET | Freq: Every day | ORAL | 0 refills | Status: DC
Start: 2023-04-24 — End: 2023-05-19

## 2023-04-24 MED ORDER — AMLODIPINE BESYLATE 5 MG PO TABS
5.0000 mg | ORAL_TABLET | Freq: Every day | ORAL | 0 refills | Status: DC
Start: 2023-04-24 — End: 2023-05-23

## 2023-04-24 MED ORDER — PANTOPRAZOLE SODIUM 40 MG PO TBEC
40.0000 mg | DELAYED_RELEASE_TABLET | Freq: Two times a day (BID) | ORAL | 0 refills | Status: DC
Start: 2023-04-24 — End: 2023-05-23

## 2023-04-24 NOTE — Progress Notes (Signed)
Patient ID: LAKETHIA CHILDREY, female    DOB: 17-Jan-1946, 77 y.o.   MRN: [id number]  PCP: Margarita Mail, DO  Chief Complaint  Patient presents with   Fatigue    Continues feeling tired, would like further testing    Subjective:   Sonya Harper is a 77 y.o. female, presents to clinic with CC of the following:  HPI  Here for f/up on fatigue -   No further weight loss- reviewed her labs  Wt Readings from Last 15 Encounters:  04/24/23 161 lb 14.4 oz (73.4 kg)  04/06/23 152 lb 12.8 oz (69.3 kg)  03/14/23 162 lb (73.5 kg)  02/09/23 159 lb (72.1 kg)  01/10/23 159 lb 4.8 oz (72.3 kg)  01/03/23 162 lb 11.2 oz (73.8 kg)  12/12/22 161 lb 6.4 oz (73.2 kg)  11/21/22 165 lb 6.4 oz (75 kg)  11/14/22 162 lb 3.2 oz (73.6 kg)  08/10/22 170 lb 6.4 oz (77.3 kg)  06/28/22 174 lb 1.6 oz (79 kg)  06/09/22 178 lb 1.6 oz (80.8 kg)  05/16/22 178 lb 6.4 oz (80.9 kg)  05/14/22 183 lb 13.8 oz (83.4 kg)  04/14/22 183 lb 14.4 oz (83.4 kg)   BMI Readings from Last 5 Encounters:  04/24/23 26.94 kg/m  04/06/23 25.43 kg/m  03/14/23 26.96 kg/m  02/09/23 26.46 kg/m  01/10/23 26.51 kg/m   She has been pushing and increasing food/calorie intake Upper and lower end and is doing a capsule study, with GI. Labs were from May she has not noted any blood in her stool no vomiting, she previously had abdominal pain that she says got a little bit better but is not completely gone, reviewed her endoscopy she did not have any gastritis or ulcers or duodenal biopsy was normal, she did take pantoprazole 40 mg twice daily for a few months which she says she did not notice any improvement (but she also did report improved abdominal pain)  Reviewed her labs which include iron panel, CBC Lab Results  Component Value Date   IRON 18 (L) 01/10/2023   TIBC 422 01/10/2023   FERRITIN 4 (L) 01/10/2023   Hemoglobin  Date Value Ref Range Status  01/10/2023 10.5 (L) 11.7 - 15.5 g/dL Final  71/02/2693 85.4 (L)  11.7 - 15.5 g/dL Final  62/70/3500 93.8 12.0 - 15.0 g/dL Final  18/29/9371 69.6 12.0 - 15.0 g/dL Final   HGB  Date Value Ref Range Status  08/16/2013 12.3 12.0 - 16.0 g/dL Final  78/93/8101 75.1 12.0 - 16.0 g/dL Final       Patient Active Problem List   Diagnosis Date Noted   Iron deficiency anemia 04/06/2023   Polyp of descending colon 04/06/2023   Status post radiofrequency ablation for arrhythmia 12/12/2022   Exposure to COVID-19 virus 09/23/2020   Hypothyroidism, adult 05/21/2020   Persistent asthma with acute exacerbation 04/24/2020   Polyp of transverse colon    Polyp of ascending colon    Chronic anticoagulation 09/12/2019   Paroxysmal atrial fibrillation (HCC) 09/12/2019   AKI (acute kidney injury) (HCC) 06/26/2019   Grade I diastolic dysfunction 06/26/2019   Mild mitral regurgitation 06/26/2019   Mild tricuspid regurgitation 06/26/2019   Lumbar facet arthropathy 11/06/2018   Prediabetes 02/27/2018   Medication monitoring encounter 11/13/2017   Abnormal laboratory test 10/16/2017   Palpitations 08/23/2017   Breast pain in female 08/10/2017   Obesity (BMI 30.0-34.9) 08/10/2017   Protein in urine 08/10/2017   Vaginal atrophy 08/10/2017   Benign essential  HTN 11/16/2016   Hyperlipidemia, mixed 11/16/2016   SOBOE (shortness of breath on exertion) 11/16/2016   Stable angina pectoris 11/16/2016   Dysrhythmia 07/07/2016   Right low back pain 07/07/2016   Fall on or from stairs or steps 07/07/2016   Family history of malignant neoplasm of gastrointestinal tract    Benign neoplasm of ascending colon    Benign neoplasm of transverse colon    Benign neoplasm of descending colon    Goiter 03/03/2015   Asthma, mild persistent 03/03/2015   Gastroesophageal reflux disease without esophagitis 03/03/2015      Current Outpatient Medications:    albuterol (VENTOLIN HFA) 108 (90 Base) MCG/ACT inhaler, Inhale 2 puffs into the lungs every 6 (six) hours as needed for  wheezing or shortness of breath., Disp: 1 each, Rfl: 2   amLODipine (NORVASC) 5 MG tablet, Take 1 tablet (5 mg total) by mouth daily., Disp: 90 tablet, Rfl: 1   apixaban (ELIQUIS) 5 MG TABS tablet, Take 1 tablet (5 mg total) by mouth 2 (two) times daily., Disp: 180 tablet, Rfl: 3   atorvastatin (LIPITOR) 10 MG tablet, Take 1 tablet (10 mg total) by mouth every other day., Disp: 45 tablet, Rfl: 1   Blood Pressure Monitoring (BLOOD PRESSURE MONITOR/L CUFF) MISC, 1 each by Does not apply route daily., Disp: 1 each, Rfl: 0   Cholecalciferol (VITAMIN D-1000 MAX ST) 25 MCG (1000 UT) tablet, Take by mouth., Disp: , Rfl:    DIPHENHYDRAMINE HCL, TOPICAL, (BENADRYL ITCH STOPPING) 2 % GEL, Apply 1 application topically in the morning, at noon, and at bedtime., Disp: 118 mL, Rfl: 1   EPINEPHrine 0.3 mg/0.3 mL IJ SOAJ injection, INJECT INTO THE MIDDLE OF THE OUTER THIGH AND HOLD FOR 10 SECONDS AS NEEDED FOR SEVERE ALLERGIC REACTION THEN CALL 911 IF USED, Disp: 2 each, Rfl: 1   fluticasone (FLONASE) 50 MCG/ACT nasal spray, Place 2 sprays into both nostrils daily., Disp: 16 g, Rfl: 6   fluticasone furoate-vilanterol (BREO ELLIPTA) 200-25 MCG/ACT AEPB, Inhale 1 puff into the lungs daily., Disp: 1 each, Rfl: 11   loratadine (CLARITIN) 10 MG tablet, Take 1 tablet (10 mg total) by mouth daily., Disp: 30 tablet, Rfl: 11   losartan (COZAAR) 50 MG tablet, Take 1 tablet (50 mg total) by mouth daily., Disp: 90 tablet, Rfl: 1   mometasone (ELOCON) 0.1 % cream, Apply once daily up to 5 days a week for eczema flares, Disp: 15 g, Rfl: 1   montelukast (SINGULAIR) 10 MG tablet, Take 1 tablet (10 mg total) by mouth at bedtime., Disp: 90 tablet, Rfl: 1   mupirocin ointment (BACTROBAN) 2 %, Apply 1 Application topically daily. Qd to excision site, Disp: 22 g, Rfl: 1   nitroGLYCERIN (NITROSTAT) 0.4 MG SL tablet, Place 1 tablet (0.4 mg total) under the tongue every 5 (five) minutes as needed for chest pain. Max 3 pills per episode,  Disp: 50 tablet, Rfl: 3   pantoprazole (PROTONIX) 40 MG tablet, TAKE 1 TABLET BY MOUTH 2 TIMES A DAY, Disp: 60 tablet, Rfl: 0   SYNTHROID 50 MCG tablet, TAKE 1 BY MOUTH DAILY. DO NOT SUBSTITUTE, Disp: , Rfl: 0   traZODone (DESYREL) 100 MG tablet, TAKE 1 TABLET BY MOUTH AT BEDTIME, Disp: 90 tablet, Rfl: 0   Vibegron (GEMTESA) 75 MG TABS, TAKE 1 TABLET BY MOUTH DAILY, Disp: 30 tablet, Rfl: 11   Allergies  Allergen Reactions   Molds & Smuts Anaphylaxis   Penicillins Nausea And Vomiting and Rash  Has patient had a PCN reaction causing immediate rash, facial/tongue/throat swelling, SOB or lightheadedness with hypotension: Yes Has patient had a PCN reaction causing severe rash involving mucus membranes or skin necrosis: No Has patient had a PCN reaction that required hospitalization: No Has patient had a PCN reaction occurring within the last 10 years: No If all of the above answers are "NO", then may proceed with Cephalosporin use.      Social History   Tobacco Use   Smoking status: Never    Passive exposure: Never   Smokeless tobacco: Never  Vaping Use   Vaping status: Never Used  Substance Use Topics   Alcohol use: No    Alcohol/week: 0.0 standard drinks of alcohol   Drug use: Never      Chart Review Today: I personally reviewed active problem list, medication list, allergies, family history, social history, health maintenance, notes from last encounter, lab results, imaging with the patient/caregiver today.   Review of Systems  Constitutional: Negative.   HENT: Negative.    Eyes: Negative.   Respiratory: Negative.    Cardiovascular: Negative.   Gastrointestinal: Negative.   Endocrine: Negative.   Genitourinary: Negative.   Musculoskeletal: Negative.   Skin: Negative.   Allergic/Immunologic: Negative.   Neurological: Negative.   Hematological: Negative.   Psychiatric/Behavioral: Negative.    All other systems reviewed and are negative.      Objective:    Vitals:   04/24/23 0904 04/24/23 0915  BP: (!) 148/82 (!) 150/74  Pulse: 78   Resp: 16   Temp: 97.9 F (36.6 C)   TempSrc: Oral   SpO2: 98%   Weight: 161 lb 14.4 oz (73.4 kg)   Height: 5\' 5"  (1.651 m)     Body mass index is 26.94 kg/m.  Physical Exam Vitals and nursing note reviewed.  Constitutional:      Appearance: She is well-developed.  HENT:     Head: Normocephalic and atraumatic.     Nose: Nose normal.  Eyes:     General:        Right eye: No discharge.        Left eye: No discharge.     Conjunctiva/sclera: Conjunctivae normal.  Neck:     Trachea: No tracheal deviation.  Cardiovascular:     Rate and Rhythm: Normal rate and regular rhythm.     Pulses: Normal pulses.     Heart sounds: Normal heart sounds.  Pulmonary:     Effort: Pulmonary effort is normal. No respiratory distress.     Breath sounds: No stridor.  Musculoskeletal:        General: Normal range of motion.  Skin:    General: Skin is warm and dry.     Findings: No rash.  Neurological:     Mental Status: She is alert.     Motor: No abnormal muscle tone.     Coordination: Coordination normal.  Psychiatric:        Behavior: Behavior normal.      Results for orders placed or performed in visit on 02/09/23  Microscopic Examination   Urine  Result Value Ref Range   WBC, UA 0-5 0 - 5 /hpf   RBC, Urine 0-2 0 - 2 /hpf   Epithelial Cells (non renal) 0-10 0 - 10 /hpf   Bacteria, UA Few None seen/Few  Urinalysis, Complete  Result Value Ref Range   Specific Gravity, UA 1.025 1.005 - 1.030   pH, UA 5.5 5.0 - 7.5   Color,  UA Yellow Yellow   Appearance Ur Clear Clear   Leukocytes,UA Trace (A) Negative   Protein,UA Negative Negative/Trace   Glucose, UA Negative Negative   Ketones, UA Negative Negative   RBC, UA Negative Negative   Bilirubin, UA Negative Negative   Urobilinogen, Ur 0.2 0.2 - 1.0 mg/dL   Nitrite, UA Negative Negative   Microscopic Examination See below:   BLADDER SCAN AMB  NON-IMAGING  Result Value Ref Range   Scan Result 0ml        Assessment & Plan:      ICD-10-CM   1. Iron deficiency anemia, unspecified iron deficiency anemia type  D50.9 CBC with Differential/Platelet    TSH    VITAMIN D 25 Hydroxy (Vit-D Deficiency, Fractures)    Vitamin B12    Iron, TIBC and Ferritin Panel    Pathologist smear review   low iron, ferritin and H/H, recheck labs, done 3 months ago, would refer to hematology if not improved    2. Vitamin D deficiency  E55.9 COMPLETE METABOLIC PANEL WITH GFR    VITAMIN D 25 Hydroxy (Vit-D Deficiency, Fractures)   recheck Vit D level    3. AKI (acute kidney injury) (HCC)  N17.9 COMPLETE METABOLIC PANEL WITH GFR   recheck renal function - pt will return to do labs in a few days, she has not been drinking enough, push fluids and recheck labs    4. Hypothyroidism, adult  E03.9 CBC with Differential/Platelet    TSH    COMPLETE METABOLIC PANEL WITH GFR   recheck TSH    5. Gastroesophageal reflux disease without esophagitis  K21.9 pantoprazole (PROTONIX) 40 MG tablet   pt did f/up with Dr. Servando Snare, some mild improvement to abd pain/GERD sx, EGD neg/normal    6. Encounter for medication monitoring  Z51.81 CBC with Differential/Platelet    TSH    COMPLETE METABOLIC PANEL WITH GFR    VITAMIN D 25 Hydroxy (Vit-D Deficiency, Fractures)    7. Weight loss, unintentional  R63.4 CBC with Differential/Platelet    TSH    COMPLETE METABOLIC PANEL WITH GFR    Pathologist smear review   weight trends have stabilized around 160    8. Fatigue, unspecified type  R53.83 CBC with Differential/Platelet    TSH    COMPLETE METABOLIC PANEL WITH GFR    VITAMIN D 25 Hydroxy (Vit-D Deficiency, Fractures)    Vitamin B12    Iron, TIBC and Ferritin Panel    Pathologist smear review   recheck CBC/iron    9. Benign essential HTN  I10 COMPLETE METABOLIC PANEL WITH GFR    amLODipine (NORVASC) 5 MG tablet    losartan (COZAAR) 100 MG tablet   not well  controlled today    10. Benign essential HTN  I10 COMPLETE METABOLIC PANEL WITH GFR    amLODipine (NORVASC) 5 MG tablet    losartan (COZAAR) 100 MG tablet    On norvasc 5 and losartan 50 - losartan was increased previously when BP was not at goal BP Readings from Last 10 Encounters:  04/24/23 (!) 150/74  04/06/23 127/62  03/14/23 (!) 164/74  02/09/23 (!) 181/74  01/10/23 (!) 160/86  01/03/23 124/76  12/12/22 126/78  11/21/22 132/80  11/14/22 124/70  08/10/22 (!) 142/84   For HTN increase losartan dose to 100 mg daily, continue norvasc 5 mg daily and f/up in office in 3-4 weeks for BP recheck    Danelle Berry, PA-C 04/24/23 9:32 AM

## 2023-04-26 DIAGNOSIS — D509 Iron deficiency anemia, unspecified: Secondary | ICD-10-CM | POA: Diagnosis not present

## 2023-04-26 DIAGNOSIS — E039 Hypothyroidism, unspecified: Secondary | ICD-10-CM | POA: Diagnosis not present

## 2023-04-26 DIAGNOSIS — E559 Vitamin D deficiency, unspecified: Secondary | ICD-10-CM | POA: Diagnosis not present

## 2023-04-26 DIAGNOSIS — N179 Acute kidney failure, unspecified: Secondary | ICD-10-CM | POA: Diagnosis not present

## 2023-04-27 ENCOUNTER — Telehealth: Payer: Self-pay | Admitting: Internal Medicine

## 2023-04-27 ENCOUNTER — Other Ambulatory Visit: Payer: Self-pay | Admitting: Internal Medicine

## 2023-04-27 DIAGNOSIS — J45901 Unspecified asthma with (acute) exacerbation: Secondary | ICD-10-CM

## 2023-04-27 DIAGNOSIS — J329 Chronic sinusitis, unspecified: Secondary | ICD-10-CM

## 2023-04-27 DIAGNOSIS — D509 Iron deficiency anemia, unspecified: Secondary | ICD-10-CM

## 2023-04-27 LAB — COMPLETE METABOLIC PANEL WITH GFR
AG Ratio: 1.6 (calc) (ref 1.0–2.5)
ALT: 9 U/L (ref 6–29)
AST: 14 U/L (ref 10–35)
Albumin: 3.9 g/dL (ref 3.6–5.1)
Alkaline phosphatase (APISO): 95 U/L (ref 37–153)
BUN: 16 mg/dL (ref 7–25)
CO2: 28 mmol/L (ref 20–32)
Calcium: 8.8 mg/dL (ref 8.6–10.4)
Chloride: 105 mmol/L (ref 98–110)
Creat: 0.94 mg/dL (ref 0.60–1.00)
Globulin: 2.5 g/dL (ref 1.9–3.7)
Glucose, Bld: 82 mg/dL (ref 65–99)
Potassium: 4.1 mmol/L (ref 3.5–5.3)
Sodium: 140 mmol/L (ref 135–146)
Total Bilirubin: 0.3 mg/dL (ref 0.2–1.2)
Total Protein: 6.4 g/dL (ref 6.1–8.1)
eGFR: 63 mL/min/{1.73_m2} (ref >=60)

## 2023-04-27 LAB — CBC WITH DIFFERENTIAL/PLATELET
Absolute Monocytes: 290 {cells}/uL (ref 200–950)
Basophils Absolute: 40 {cells}/uL (ref 0–200)
Basophils Relative: 0.8 %
Eosinophils Absolute: 110 {cells}/uL (ref 15–500)
Eosinophils Relative: 2.2 %
HCT: 33.9 % — ABNORMAL LOW (ref 35.0–45.0)
Hemoglobin: 10.5 g/dL — ABNORMAL LOW (ref 11.7–15.5)
Lymphs Abs: 1855 {cells}/uL (ref 850–3900)
MCH: 24.1 pg — ABNORMAL LOW (ref 27.0–33.0)
MCHC: 31 g/dL — ABNORMAL LOW (ref 32.0–36.0)
MCV: 77.8 fL — ABNORMAL LOW (ref 80.0–100.0)
MPV: 11.9 fL (ref 7.5–12.5)
Monocytes Relative: 5.8 %
Neutro Abs: 2705 {cells}/uL (ref 1500–7800)
Neutrophils Relative %: 54.1 %
Platelets: 292 10*3/uL (ref 140–400)
RBC: 4.36 10*6/uL (ref 3.80–5.10)
RDW: 16.1 % — ABNORMAL HIGH (ref 11.0–15.0)
Total Lymphocyte: 37.1 %
WBC: 5 10*3/uL (ref 3.8–10.8)

## 2023-04-27 LAB — IRON,TIBC AND FERRITIN PANEL
%SAT: 5 % — ABNORMAL LOW (ref 16–45)
Ferritin: 5 ng/mL — ABNORMAL LOW (ref 16–288)
Iron: 21 ug/dL — ABNORMAL LOW (ref 45–160)
TIBC: 434 ug/dL (ref 250–450)

## 2023-04-27 LAB — TSH: TSH: 1.73 m[IU]/L (ref 0.40–4.50)

## 2023-04-27 LAB — VITAMIN D 25 HYDROXY (VIT D DEFICIENCY, FRACTURES): Vit D, 25-Hydroxy: 49 ng/mL (ref 30–100)

## 2023-04-27 LAB — VITAMIN B12: Vitamin B-12: 412 pg/mL (ref 200–1100)

## 2023-04-27 LAB — PATHOLOGIST SMEAR REVIEW

## 2023-04-27 MED ORDER — IRON (FERROUS SULFATE) 325 (65 FE) MG PO TABS
325.0000 mg | ORAL_TABLET | Freq: Every day | ORAL | 1 refills | Status: AC
Start: 2023-04-27 — End: ?

## 2023-04-27 NOTE — Telephone Encounter (Signed)
Please f/u with patient to answer questions about her results as she states she is still tired.

## 2023-04-27 NOTE — Telephone Encounter (Signed)
Pt notified- verbalized understanding.

## 2023-04-27 NOTE — Telephone Encounter (Signed)
Please review looks like she has some anemia?

## 2023-04-27 NOTE — Telephone Encounter (Signed)
Requested Prescriptions  Pending Prescriptions Disp Refills   montelukast (SINGULAIR) 10 MG tablet [Pharmacy Med Name: MONTELUKAST SOD 10 MG TABLET] 90 tablet 3    Sig: TAKE 1 TABLET BY MOUTH AT BEDTIME     Pulmonology:  Leukotriene Inhibitors Passed - 04/27/2023  6:22 AM      Passed - Valid encounter within last 12 months    Recent Outpatient Visits           3 days ago Iron deficiency anemia, unspecified iron deficiency anemia type   [facility] Danelle Berry, PA-C   3 months ago Lower abdominal pain   [facility] Danelle Berry, PA-C   3 months ago Benign essential HTN   [facility] Margarita Mail, DO   4 months ago Generalized abdominal pain   [facility], Oswaldo Conroy, PA-C   5 months ago Generalized abdominal pain   [facility] Margarita Mail, DO       Future Appointments             In 1 week Carman Ching, PA-C [facility] [place]   In 2 months Margarita Mail, DO [facility]

## 2023-05-01 ENCOUNTER — Telehealth: Payer: Self-pay

## 2023-05-01 NOTE — Telephone Encounter (Signed)
Pt called with some questions regarding her capsule study scheduled for tomorrow and wanted to verify instructions... Nothing further needed

## 2023-05-02 ENCOUNTER — Ambulatory Visit
Admission: RE | Admit: 2023-05-02 | Discharge: 2023-05-02 | Disposition: A | Discharge status: Home / Self Care | Payer: Medicare HMO | Attending: Gastroenterology | Admitting: Gastroenterology

## 2023-05-02 ENCOUNTER — Encounter
Admission: RE | Disposition: A | Discharge status: Home / Self Care | Payer: Self-pay | Source: Home / Self Care | Attending: Gastroenterology

## 2023-05-02 DIAGNOSIS — D509 Iron deficiency anemia, unspecified: Secondary | ICD-10-CM | POA: Diagnosis not present

## 2023-05-02 HISTORY — PX: GIVENS CAPSULE STUDY: SHX5432

## 2023-05-02 SURGERY — GIVENS CAPSULE STUDY

## 2023-05-02 NOTE — Addendum Note (Signed)
Addended by: Danelle Berry on: 05/02/2023 09:49 AM   Modules accepted: Orders

## 2023-05-03 ENCOUNTER — Encounter: Payer: Self-pay | Admitting: Gastroenterology

## 2023-05-04 ENCOUNTER — Ambulatory Visit: Payer: Medicare HMO | Admitting: Physician Assistant

## 2023-05-05 ENCOUNTER — Encounter: Payer: Self-pay | Admitting: Oncology

## 2023-05-05 ENCOUNTER — Inpatient Hospital Stay: Payer: Medicare HMO

## 2023-05-05 ENCOUNTER — Inpatient Hospital Stay: Payer: Medicare HMO | Attending: Oncology | Admitting: Oncology

## 2023-05-05 VITALS — BP 144/88 | HR 82 | Temp 97.6°F | Resp 20 | Wt 161.2 lb

## 2023-05-05 DIAGNOSIS — I10 Essential (primary) hypertension: Secondary | ICD-10-CM | POA: Insufficient documentation

## 2023-05-05 DIAGNOSIS — D509 Iron deficiency anemia, unspecified: Secondary | ICD-10-CM

## 2023-05-05 DIAGNOSIS — I4891 Unspecified atrial fibrillation: Secondary | ICD-10-CM | POA: Insufficient documentation

## 2023-05-05 DIAGNOSIS — E039 Hypothyroidism, unspecified: Secondary | ICD-10-CM | POA: Diagnosis not present

## 2023-05-05 DIAGNOSIS — Z803 Family history of malignant neoplasm of breast: Secondary | ICD-10-CM | POA: Insufficient documentation

## 2023-05-05 DIAGNOSIS — Z7901 Long term (current) use of anticoagulants: Secondary | ICD-10-CM | POA: Insufficient documentation

## 2023-05-05 NOTE — Assessment & Plan Note (Signed)
Labs are reviewed and discussed with patient. I discussed about option of continue oral iron supplementation and repeat blood work for evaluation of treatment response.  If no significant improvement, then proceed with IV Venofer treatments. Alternative option of proceed with IV Venofer treatments. I discussed about the potential risks including but not limited to allergic reactions/infusion reactions including anaphylactic reactions, phlebitis, high blood pressure, wheezing, SOB, skin rash, weight gain,dark urine, leg swelling, back pain, headache, nausea and fatigue, etc. Patient desires to achieved higher level of iron faster for adequate hematopoesis and agrees IV Venofer.  Plan IV venofer weekly x 4

## 2023-05-05 NOTE — Progress Notes (Signed)
Hematology/Oncology Consult note [phone] [phone]      Patient Care Team: Margarita Mail, DO as PCP - General (Internal Medicine)   REFERRING PROVIDER: Danelle Berry, PA-C  CHIEF COMPLAINTS/REASON FOR VISIT:  Anemia  ASSESSMENT & PLAN:  Iron deficiency anemia Labs are reviewed and discussed with patient. I discussed about option of continue oral iron supplementation and repeat blood work for evaluation of treatment response.  If no significant improvement, then proceed with IV Venofer treatments. Alternative option of proceed with IV Venofer treatments. I discussed about the potential risks including but not limited to allergic reactions/infusion reactions including anaphylactic reactions, phlebitis, high blood pressure, wheezing, SOB, skin rash, weight gain,dark urine, leg swelling, back pain, headache, nausea and fatigue, etc. Patient desires to achieved higher level of iron faster for adequate hematopoesis and agrees IV Venofer.  Plan IV venofer weekly x 4 Follow up with GI   Orders Placed This Encounter  Procedures   CBC with Differential (Cancer Center Only)    Standing Status:   Future    Standing Expiration Date:   05/04/2024   Iron and TIBC    Standing Status:   Future    Standing Expiration Date:   05/04/2024   Ferritin    Standing Status:   Future    Standing Expiration Date:   05/04/2024   Retic Panel    Standing Status:   Future    Standing Expiration Date:   05/04/2024   Celiac panel 10    Standing Status:   Future    Standing Expiration Date:   05/04/2024   Follow up in 3 months. All questions were answered. The patient knows to call the clinic with any problems, questions or concerns.  Rickard Patience, MD, PhD [facility] Hematology Oncology 05/05/2023     HISTORY OF PRESENTING ILLNESS:  Sonya Harper is a  77 y.o.  female with PMH listed below who was referred to me for anemia Reviewed patient's recent labs that was done.   She was found to have abnormal CBC on 04/26/2023, Hb 10.5, mcv 77.8, ferritin 5, iron saturation 5, TIBC 434, B12 412 Reviewed patient's previous labs ordered by primary care physician's office, anemia is since April 2024,  No aggravating or improving factors. + fatigue.  She denies recent chest pain on exertion, shortness of breath on minimal exertion, pre-syncopal episodes, or palpitations She had not noticed any recent bleeding such as epistaxis, hematuria or hematochezia.  She is 5mg  BID for Afib.  Her last EGD and colonoscopy were on 04/06/2023, Tubular adenoma and hyperplastic polyps. She just had capsule endoscopy done, result is pending.     MEDICAL HISTORY:  Past Medical History:  Diagnosis Date   A-fib [facility])    Allergy    Arthritis    feet   Asthma    Breast neoplasm    Chronic lower back pain    s/p fall injury   Depression    Dysrhythmia    Fibromyalgia    Hypertension    Hypothyroidism    Lumbar facet arthropathy 11/06/2018   Prediabetes 02/27/2018   Sciatica of right side    Wears dentures    partial upper and lower    SURGICAL HISTORY: Past Surgical History:  Procedure Laterality Date   ABDOMINAL HYSTERECTOMY     BIOPSY  04/06/2023   Procedure: BIOPSY;  Surgeon: Midge Minium, MD;  Location: [facility] ENDOSCOPY;  Service: Endoscopy;;   CATARACT EXTRACTION W/PHACO Right 02/02/2022   Procedure: CATARACT EXTRACTION  PHACO AND INTRAOCULAR LENS PLACEMENT (IOC) RIGHT;  Surgeon: Lockie Mola, MD;  Location: [facility] SURGERY CNTR;  Service: Ophthalmology;  Laterality: Right;  4.52 00:45.9   CATARACT EXTRACTION W/PHACO Left 02/16/2022   Procedure: CATARACT EXTRACTION PHACO AND INTRAOCULAR LENS PLACEMENT (IOC) LEFT 7.05 01:19.8;  Surgeon: Lockie Mola, MD;  Location: [facility] SURGERY CNTR;  Service: Ophthalmology;  Laterality: Left;   COLONOSCOPY WITH PROPOFOL N/A 05/13/2016   Procedure: COLONOSCOPY WITH PROPOFOL;  Surgeon: Midge Minium, MD;  Location: [facility] SURGERY  CNTR;  Service: Endoscopy;  Laterality: N/A;   COLONOSCOPY WITH PROPOFOL N/A 09/20/2019   Procedure: COLONOSCOPY WITH PROPOFOL;  Surgeon: Midge Minium, MD;  Location: [facility] ENDOSCOPY;  Service: Endoscopy;  Laterality: N/A;   COLONOSCOPY WITH PROPOFOL N/A 04/06/2023   Procedure: COLONOSCOPY WITH PROPOFOL;  Surgeon: Midge Minium, MD;  Location: [facility] ENDOSCOPY;  Service: Endoscopy;  Laterality: N/A;   ELECTROPHYSIOLOGIC STUDY     Ablation   ESOPHAGOGASTRODUODENOSCOPY (EGD) WITH PROPOFOL N/A 09/20/2019   Procedure: ESOPHAGOGASTRODUODENOSCOPY (EGD) WITH PROPOFOL;  Surgeon: Midge Minium, MD;  Location: [facility] ENDOSCOPY;  Service: Endoscopy;  Laterality: N/A;   ESOPHAGOGASTRODUODENOSCOPY (EGD) WITH PROPOFOL N/A 04/06/2023   Procedure: ESOPHAGOGASTRODUODENOSCOPY (EGD) WITH PROPOFOL;  Surgeon: Midge Minium, MD;  Location: [facility] ENDOSCOPY;  Service: Endoscopy;  Laterality: N/A;   GIVENS CAPSULE STUDY N/A 05/02/2023   Procedure: GIVENS CAPSULE STUDY;  Surgeon: Midge Minium, MD;  Location: [facility] ENDOSCOPY;  Service: Endoscopy;  Laterality: N/A;   POLYPECTOMY N/A 05/13/2016   Procedure: POLYPECTOMY;  Surgeon: Midge Minium, MD;  Location: [facility] SURGERY [facility];  Service: Endoscopy;  Laterality: N/A;   POLYPECTOMY  04/06/2023   Procedure: POLYPECTOMY;  Surgeon: Midge Minium, MD;  Location: [facility] ENDOSCOPY;  Service: Endoscopy;;    SOCIAL HISTORY: Social History   Socioeconomic History   Marital status: Married    Spouse name: Not on file   Number of children: Not on file   Years of education: Not on file   Highest education level: Not on file  Occupational History   Not on file  Tobacco Use   Smoking status: Never    Passive exposure: Never   Smokeless tobacco: Never  Vaping Use   Vaping status: Never Used  Substance and Sexual Activity   Alcohol use: No    Alcohol/week: 0.0 standard drinks of alcohol   Drug use: Never   Sexual activity: Not Currently    Birth control/protection: Post-menopausal  Other  Topics Concern   Not on file  Social History Narrative   Not on file   Social Determinants of Health   Financial Resource Strain: Not on file  Food Insecurity: No Food Insecurity (05/05/2023)   Hunger Vital Sign    Worried About Running Out of Food in the Last Year: Never true    Ran Out of Food in the Last Year: Never true  Transportation Needs: No Transportation Needs (05/05/2023)   PRAPARE - Administrator, Civil Service (Medical): No    Lack of Transportation (Non-Medical): No  Physical Activity: Not on file  Stress: Not on file  Social Connections: Not on file  Intimate Partner Violence: Not At Risk (05/05/2023)   Humiliation, Afraid, Rape, and Kick questionnaire    Fear of Current or Ex-Partner: No    Emotionally Abused: No    Physically Abused: No    Sexually Abused: No    FAMILY HISTORY: Family History  Problem Relation Age of Onset   Stroke Mother    Breast cancer Sister 70  breast ca x3   Colon cancer Paternal Aunt    Cancer Sister     ALLERGIES:  is allergic to molds & smuts and penicillins.  MEDICATIONS:  Current Outpatient Medications  Medication Sig Dispense Refill   albuterol (VENTOLIN HFA) 108 (90 Base) MCG/ACT inhaler Inhale 2 puffs into the lungs every 6 (six) hours as needed for wheezing or shortness of breath. 1 each 2   amLODipine (NORVASC) 5 MG tablet Take 1 tablet (5 mg total) by mouth daily. 30 tablet 0   apixaban (ELIQUIS) 5 MG TABS tablet Take 1 tablet (5 mg total) by mouth 2 (two) times daily. 180 tablet 3   atorvastatin (LIPITOR) 10 MG tablet Take 1 tablet (10 mg total) by mouth every other day. 45 tablet 1   Blood Pressure Monitoring (BLOOD PRESSURE MONITOR/L CUFF) MISC 1 each by Does not apply route daily. 1 each 0   Cholecalciferol (VITAMIN D-1000 MAX ST) 25 MCG (1000 UT) tablet Take by mouth.     DIPHENHYDRAMINE HCL, TOPICAL, (BENADRYL ITCH STOPPING) 2 % GEL Apply 1 application topically in the morning, at noon, and at  bedtime. 118 mL 1   EPINEPHrine 0.3 mg/0.3 mL IJ SOAJ injection INJECT INTO THE MIDDLE OF THE OUTER THIGH AND HOLD FOR 10 SECONDS AS NEEDED FOR SEVERE ALLERGIC REACTION THEN CALL 911 IF USED 2 each 1   fluticasone (FLONASE) 50 MCG/ACT nasal spray Place 2 sprays into both nostrils daily. 16 g 6   fluticasone furoate-vilanterol (BREO ELLIPTA) 200-25 MCG/ACT AEPB Inhale 1 puff into the lungs daily. 1 each 11   Iron, Ferrous Sulfate, 325 (65 Fe) MG TABS Take 325 mg by mouth daily. 90 tablet 1   loratadine (CLARITIN) 10 MG tablet Take 1 tablet (10 mg total) by mouth daily. 30 tablet 11   losartan (COZAAR) 100 MG tablet Take 1 tablet (100 mg total) by mouth daily. 30 tablet 0   mometasone (ELOCON) 0.1 % cream Apply once daily up to 5 days a week for eczema flares 15 g 1   mupirocin ointment (BACTROBAN) 2 % Apply 1 Application topically daily. Qd to excision site 22 g 1   nitroGLYCERIN (NITROSTAT) 0.4 MG SL tablet Place 1 tablet (0.4 mg total) under the tongue every 5 (five) minutes as needed for chest pain. Max 3 pills per episode 50 tablet 3   pantoprazole (PROTONIX) 40 MG tablet Take 1 tablet (40 mg total) by mouth 2 (two) times daily. 60 tablet 0   SYNTHROID 50 MCG tablet TAKE 1 BY MOUTH DAILY. DO NOT SUBSTITUTE  0   traZODone (DESYREL) 100 MG tablet TAKE 1 TABLET BY MOUTH AT BEDTIME 90 tablet 0   Vibegron (GEMTESA) 75 MG TABS TAKE 1 TABLET BY MOUTH DAILY 30 tablet 11   montelukast (SINGULAIR) 10 MG tablet TAKE 1 TABLET BY MOUTH AT BEDTIME (Patient not taking: Reported on 05/05/2023) 90 tablet 3   No current facility-administered medications for this visit.    Review of Systems  Constitutional:  Positive for fatigue. Negative for appetite change, chills and fever.  HENT:   Negative for hearing loss and voice change.   Eyes:  Negative for eye problems.  Respiratory:  Negative for chest tightness and cough.   Cardiovascular:  Negative for chest pain.  Gastrointestinal:  Negative for abdominal  distention, abdominal pain and blood in stool.  Endocrine: Negative for hot flashes.  Genitourinary:  Negative for difficulty urinating and frequency.   Musculoskeletal:  Negative for arthralgias.  Skin:  Negative for itching and rash.  Neurological:  Negative for extremity weakness.  Hematological:  Negative for adenopathy.  Psychiatric/Behavioral:  Negative for confusion.     PHYSICAL EXAMINATION: Vitals:   05/05/23 0930  BP: (!) 144/88  Pulse: 82  Resp: 20  Temp: 97.6 F (36.4 C)  SpO2: 100%   Filed Weights   05/05/23 0930  Weight: 161 lb 3.2 oz (73.1 kg)    Physical Exam Constitutional:      General: She is not in acute distress. HENT:     Head: Normocephalic and atraumatic.  Eyes:     General: No scleral icterus. Cardiovascular:     Rate and Rhythm: Normal rate. Rhythm irregular.     Heart sounds: Murmur heard.  Pulmonary:     Effort: Pulmonary effort is normal. No respiratory distress.  Abdominal:     General: Bowel sounds are normal. There is no distension.     Palpations: Abdomen is soft.  Musculoskeletal:        General: No deformity. Normal range of motion.     Cervical back: Normal range of motion and neck supple.  Skin:    General: Skin is warm and dry.     Findings: No erythema or rash.  Neurological:     Mental Status: She is alert and oriented to person, place, and time. Mental status is at baseline.     Cranial Nerves: No cranial nerve deficit.     Coordination: Coordination normal.  Psychiatric:        Mood and Affect: Mood normal.      LABORATORY DATA:  I have reviewed the data as listed    Latest Ref Rng & Units 04/26/2023    8:26 AM 01/10/2023   10:13 AM 01/03/2023   11:21 AM  CBC  WBC 3.8 - 10.8 Thousand/uL 5.0  4.6  5.1   Hemoglobin 11.7 - 15.5 g/dL 52.8  41.3  24.4   Hematocrit 35.0 - 45.0 % 33.9  33.8  33.7   Platelets 140 - 400 Thousand/uL 292  283  272       Latest Ref Rng & Units 04/26/2023    8:26 AM 01/10/2023   10:13 AM  01/03/2023   11:21 AM  CMP  Glucose 65 - 99 mg/dL 82  95  89   BUN 7 - 25 mg/dL 16  18  17    Creatinine 0.60 - 1.00 mg/dL 0.10  2.72  5.36   Sodium 135 - 146 mmol/L 140  142  139   Potassium 3.5 - 5.3 mmol/L 4.1  4.4  4.3   Chloride 98 - 110 mmol/L 105  107  105   CO2 20 - 32 mmol/L 28  26  28    Calcium 8.6 - 10.4 mg/dL 8.8  8.9  8.8   Total Protein 6.1 - 8.1 g/dL 6.4  6.1  6.1   Total Bilirubin 0.2 - 1.2 mg/dL 0.3  0.5  0.3   AST 10 - 35 U/L 14  13  12    ALT 6 - 29 U/L 9  8  7     Lab Results  Component Value Date   IRON 21 (L) 04/26/2023   TIBC 434 04/26/2023   IRONPCTSAT 5 (L) 04/26/2023   FERRITIN 5 (L) 04/26/2023     RADIOGRAPHIC STUDIES: I have personally reviewed the radiological images as listed and agreed with the findings in the report. No results found.

## 2023-05-10 ENCOUNTER — Encounter: Payer: Self-pay | Admitting: Oncology

## 2023-05-11 ENCOUNTER — Other Ambulatory Visit: Payer: Medicare HMO

## 2023-05-11 ENCOUNTER — Ambulatory Visit
Admission: RE | Admit: 2023-05-11 | Discharge: 2023-05-11 | Disposition: A | Discharge status: Home / Self Care | Payer: Medicare HMO | Source: Ambulatory Visit | Attending: Internal Medicine | Admitting: Internal Medicine

## 2023-05-11 ENCOUNTER — Other Ambulatory Visit: Payer: Self-pay

## 2023-05-11 ENCOUNTER — Inpatient Hospital Stay: Payer: Medicare HMO | Attending: Oncology

## 2023-05-11 DIAGNOSIS — Z780 Asymptomatic menopausal state: Secondary | ICD-10-CM | POA: Diagnosis not present

## 2023-05-11 DIAGNOSIS — Z1231 Encounter for screening mammogram for malignant neoplasm of breast: Secondary | ICD-10-CM | POA: Insufficient documentation

## 2023-05-11 DIAGNOSIS — M85851 Other specified disorders of bone density and structure, right thigh: Secondary | ICD-10-CM | POA: Insufficient documentation

## 2023-05-11 DIAGNOSIS — D509 Iron deficiency anemia, unspecified: Secondary | ICD-10-CM | POA: Insufficient documentation

## 2023-05-11 DIAGNOSIS — M85852 Other specified disorders of bone density and structure, left thigh: Secondary | ICD-10-CM | POA: Diagnosis not present

## 2023-05-11 DIAGNOSIS — Z13820 Encounter for screening for osteoporosis: Secondary | ICD-10-CM | POA: Insufficient documentation

## 2023-05-11 LAB — CBC WITH DIFFERENTIAL (CANCER CENTER ONLY)
Abs Immature Granulocytes: 0.01 10*3/uL (ref 0.00–0.07)
Basophils Absolute: 0 10*3/uL (ref 0.0–0.1)
Basophils Relative: 1 %
Eosinophils Absolute: 0.1 10*3/uL (ref 0.0–0.5)
Eosinophils Relative: 1 %
HCT: 35.1 % — ABNORMAL LOW (ref 36.0–46.0)
Hemoglobin: 10.6 g/dL — ABNORMAL LOW (ref 12.0–15.0)
Immature Granulocytes: 0 %
Lymphocytes Relative: 32 %
Lymphs Abs: 2 10*3/uL (ref 0.7–4.0)
MCH: 24.4 pg — ABNORMAL LOW (ref 26.0–34.0)
MCHC: 30.2 g/dL (ref 30.0–36.0)
MCV: 80.9 fL (ref 80.0–100.0)
Monocytes Absolute: 0.4 10*3/uL (ref 0.1–1.0)
Monocytes Relative: 6 %
Neutro Abs: 3.8 10*3/uL (ref 1.7–7.7)
Neutrophils Relative %: 60 %
Platelet Count: 253 10*3/uL (ref 150–400)
RBC: 4.34 MIL/uL (ref 3.87–5.11)
RDW: 20.2 % — ABNORMAL HIGH (ref 11.5–15.5)
WBC Count: 6.4 10*3/uL (ref 4.0–10.5)
nRBC: 0 % (ref 0.0–0.2)

## 2023-05-11 LAB — SAMPLE TO BLOOD BANK

## 2023-05-11 LAB — ABO/RH: ABO/RH(D): O POS

## 2023-05-11 NOTE — Telephone Encounter (Signed)
Spoke to pt and she states she has been feeling weak so she would like for labs rechecked. Pt will come in for labs today and iron infusion has been moved to tomorrow. Iron infusion Appt on 9/30 has been cancelled since first dose has been moved to this week.

## 2023-05-12 ENCOUNTER — Other Ambulatory Visit: Payer: Self-pay | Admitting: Internal Medicine

## 2023-05-12 ENCOUNTER — Inpatient Hospital Stay: Payer: Medicare HMO

## 2023-05-12 ENCOUNTER — Other Ambulatory Visit: Payer: Self-pay | Admitting: Oncology

## 2023-05-12 VITALS — BP 166/81 | HR 64 | Temp 96.5°F | Resp 19

## 2023-05-12 DIAGNOSIS — D509 Iron deficiency anemia, unspecified: Secondary | ICD-10-CM | POA: Diagnosis not present

## 2023-05-12 DIAGNOSIS — R928 Other abnormal and inconclusive findings on diagnostic imaging of breast: Secondary | ICD-10-CM

## 2023-05-12 DIAGNOSIS — N6489 Other specified disorders of breast: Secondary | ICD-10-CM

## 2023-05-12 MED ORDER — SODIUM CHLORIDE 0.9 % IV SOLN
200.0000 mg | Freq: Once | INTRAVENOUS | Status: AC
  Administered 2023-05-12: 200 mg via INTRAVENOUS
  Filled 2023-05-12: qty 200

## 2023-05-12 MED ORDER — SODIUM CHLORIDE 0.9 % IV SOLN
Freq: Once | INTRAVENOUS | Status: AC
  Filled 2023-05-12: qty 250

## 2023-05-14 ENCOUNTER — Encounter: Payer: Self-pay | Admitting: Emergency Medicine

## 2023-05-14 ENCOUNTER — Emergency Department
Admission: EM | Admit: 2023-05-14 | Discharge: 2023-05-14 | Disposition: A | Discharge status: Home / Self Care | Payer: Medicare HMO | Attending: Emergency Medicine | Admitting: Emergency Medicine

## 2023-05-14 ENCOUNTER — Other Ambulatory Visit: Payer: Self-pay

## 2023-05-14 ENCOUNTER — Emergency Department: Payer: Medicare HMO

## 2023-05-14 DIAGNOSIS — I480 Paroxysmal atrial fibrillation: Secondary | ICD-10-CM | POA: Diagnosis not present

## 2023-05-14 DIAGNOSIS — Z7901 Long term (current) use of anticoagulants: Secondary | ICD-10-CM | POA: Diagnosis not present

## 2023-05-14 DIAGNOSIS — R1032 Left lower quadrant pain: Secondary | ICD-10-CM | POA: Diagnosis not present

## 2023-05-14 DIAGNOSIS — R053 Chronic cough: Secondary | ICD-10-CM | POA: Insufficient documentation

## 2023-05-14 DIAGNOSIS — R197 Diarrhea, unspecified: Secondary | ICD-10-CM | POA: Insufficient documentation

## 2023-05-14 DIAGNOSIS — J45909 Unspecified asthma, uncomplicated: Secondary | ICD-10-CM | POA: Insufficient documentation

## 2023-05-14 DIAGNOSIS — K5730 Diverticulosis of large intestine without perforation or abscess without bleeding: Secondary | ICD-10-CM | POA: Diagnosis not present

## 2023-05-14 DIAGNOSIS — R059 Cough, unspecified: Secondary | ICD-10-CM | POA: Diagnosis not present

## 2023-05-14 DIAGNOSIS — R112 Nausea with vomiting, unspecified: Secondary | ICD-10-CM | POA: Diagnosis not present

## 2023-05-14 DIAGNOSIS — K7689 Other specified diseases of liver: Secondary | ICD-10-CM | POA: Diagnosis not present

## 2023-05-14 LAB — LIPASE, BLOOD: Lipase: 26 U/L (ref 11–51)

## 2023-05-14 LAB — URINALYSIS, ROUTINE W REFLEX MICROSCOPIC
Bilirubin Urine: NEGATIVE
Glucose, UA: NEGATIVE mg/dL
Hgb urine dipstick: NEGATIVE
Ketones, ur: NEGATIVE mg/dL
Leukocytes,Ua: NEGATIVE
Nitrite: NEGATIVE
Protein, ur: NEGATIVE mg/dL
Specific Gravity, Urine: 1.028 (ref 1.005–1.030)
pH: 5 (ref 5.0–8.0)

## 2023-05-14 LAB — COMPREHENSIVE METABOLIC PANEL
ALT: 15 U/L (ref 0–44)
AST: 19 U/L (ref 15–41)
Albumin: 3.7 g/dL (ref 3.5–5.0)
Alkaline Phosphatase: 86 U/L (ref 38–126)
Anion gap: 8 (ref 5–15)
BUN: 14 mg/dL (ref 8–23)
CO2: 25 mmol/L (ref 22–32)
Calcium: 8.5 mg/dL — ABNORMAL LOW (ref 8.9–10.3)
Chloride: 105 mmol/L (ref 98–111)
Creatinine, Ser: 0.87 mg/dL (ref 0.44–1.00)
GFR, Estimated: >60 mL/min (ref >60)
Glucose, Bld: 98 mg/dL (ref 70–99)
Potassium: 3.6 mmol/L (ref 3.5–5.1)
Sodium: 138 mmol/L (ref 135–145)
Total Bilirubin: 0.7 mg/dL (ref 0.3–1.2)
Total Protein: 6.8 g/dL (ref 6.5–8.1)

## 2023-05-14 LAB — CBC
HCT: 39.3 % (ref 36.0–46.0)
Hemoglobin: 11.9 g/dL — ABNORMAL LOW (ref 12.0–15.0)
MCH: 24.3 pg — ABNORMAL LOW (ref 26.0–34.0)
MCHC: 30.3 g/dL (ref 30.0–36.0)
MCV: 80.2 fL (ref 80.0–100.0)
Platelets: 285 10*3/uL (ref 150–400)
RBC: 4.9 MIL/uL (ref 3.87–5.11)
RDW: 21.2 % — ABNORMAL HIGH (ref 11.5–15.5)
WBC: 5.8 10*3/uL (ref 4.0–10.5)
nRBC: 0 % (ref 0.0–0.2)

## 2023-05-14 MED ORDER — HYDROCOD POLI-CHLORPHE POLI ER 10-8 MG/5ML PO SUER: 2.5000 mL | Freq: Two times a day (BID) | ORAL | 0 refills | Status: DC | PRN

## 2023-05-14 MED ORDER — ONDANSETRON 4 MG PO TBDP: 4.0000 mg | ORAL_TABLET | Freq: Four times a day (QID) | ORAL | 0 refills | Status: DC | PRN

## 2023-05-14 MED ORDER — ONDANSETRON HCL 4 MG/2ML IJ SOLN
4.0000 mg | INTRAMUSCULAR | Status: AC
  Administered 2023-05-14: 4 mg via INTRAVENOUS
  Filled 2023-05-14: qty 2

## 2023-05-14 MED ORDER — SODIUM CHLORIDE 0.9 % IV BOLUS
500.0000 mL | Freq: Once | INTRAVENOUS | Status: AC
  Administered 2023-05-14: 500 mL via INTRAVENOUS

## 2023-05-14 MED ORDER — IOHEXOL 300 MG/ML  SOLN
80.0000 mL | Freq: Once | INTRAMUSCULAR | Status: AC | PRN
  Administered 2023-05-14: 80 mL via INTRAVENOUS

## 2023-05-14 NOTE — Discharge Instructions (Addendum)
Please return to the emergency room right away if you are to develop a fever, severe nausea, your pain becomes severe or worsens, you are unable to keep food down, begin vomiting any dark or bloody fluid, you develop any dark or bloody stools, feel dehydrated, or other new concerns or symptoms arise.  Do not drive while taking hydrocodone chlorpheniramine suspension.  Use only as prescribed.

## 2023-05-14 NOTE — ED Triage Notes (Signed)
Pt in via POV, reports onset N/V/D yesterday evening w/ abdominal pain.  Reports receiving first Iron infusion yesterday; unsure if this could be related.  Ambulatory to triage, NAD noted at this time.

## 2023-05-14 NOTE — ED Provider Notes (Signed)
[facility] Provider Note    Event Date/Time   First MD Initiated Contact with Patient 05/14/23 1148     (approximate)   History   Abdominal Pain, Emesis, and Diarrhea   HPI  Sonya Harper is a 77 y.o. female has a history of asthma, reflux, hyperlipidemia, stable angina AKI use of anticoagulants, paroxysmal A-fib hypothyroidism   Patient reports that early yesterday started having nausea loose nonbloody stool, and intermittent vomiting.  Has been able to eat and drink some and keep food on her stomach.  Was having quite a bit of cramps nausea and vomiting.  No black or bloody stools.  Recently had a pill endoscopy has not received the results.  She would had this performed due to chronic anemia that she has been treated for with iron infusions.  She relates that she seemed to feel fine until the day after her iron transfusion.  She has not had a fever.  No chest pain no trouble breathing.  Symptoms of loose stool nausea vomiting improving slowly today.  Physical Exam   Triage Vital Signs: ED Triage Vitals  Encounter Vitals Group     BP 05/14/23 1037 (!) 177/81     Systolic BP Percentile --      Diastolic BP Percentile --      Pulse Rate 05/14/23 1037 85     Resp 05/14/23 1037 20     Temp 05/14/23 1037 97.8 F (36.6 C)     Temp src --      SpO2 05/14/23 1037 98 %     Weight 05/14/23 1037 161 lb (73 kg)     Height 05/14/23 1037 5\' 5"  (1.651 m)     Head Circumference --      Peak Flow --      Pain Score 05/14/23 1036 4     Pain Loc --      Pain Education --      Exclude from Growth Chart --     Most recent vital signs: Vitals:   05/14/23 1037  BP: (!) 177/81  Pulse: 85  Resp: 20  Temp: 97.8 F (36.6 C)  SpO2: 98%     General: Awake, no distress.  CV:  Good peripheral perfusion.  Slightly irregular tones normal rate Resp:  Normal effort.  Clear bilateral normal work of breathing Abd:  No distention.  Soft, reports mild discomfort  throughout but no focality, except for maybe slight increase in discomfort noted in the left lower quadrant.  No rebound no guarding no peritonitis Other:  Well-perfused lower extremities   ED Results / Procedures / Treatments   Labs (all labs ordered are listed, but only abnormal results are displayed) Labs Reviewed  COMPREHENSIVE METABOLIC PANEL - Abnormal; Notable for the following components:      Result Value   Calcium 8.5 (*)    All other components within normal limits  CBC - Abnormal; Notable for the following components:   Hemoglobin 11.9 (*)    MCH 24.3 (*)    RDW 21.2 (*)    All other components within normal limits  URINALYSIS, ROUTINE W REFLEX MICROSCOPIC - Abnormal; Notable for the following components:   Color, Urine YELLOW (*)    APPearance HAZY (*)    All other components within normal limits  LIPASE, BLOOD      RADIOLOGY  CT imaging inter by me as grossly negative for acute finding   CT ABDOMEN PELVIS W CONTRAST  Result Date:  05/14/2023 CLINICAL DATA:  Left lower quadrant pain. EXAM: CT ABDOMEN AND PELVIS WITH CONTRAST TECHNIQUE: Multidetector CT imaging of the abdomen and pelvis was performed using the standard protocol following bolus administration of intravenous contrast. RADIATION DOSE REDUCTION: This exam was performed according to the departmental dose-optimization program which includes automated exposure control, adjustment of the mA and/or kV according to patient size and/or use of iterative reconstruction technique. CONTRAST:  80mL OMNIPAQUE IOHEXOL 300 MG/ML  SOLN COMPARISON:  Noncontrast CT on 11/30/2022 FINDINGS: Lower Chest: No acute findings. Hepatobiliary: No suspicious hepatic masses identified. Stable tiny sub-cm cyst in inferior right hepatic lobe. Gallbladder is unremarkable. No evidence of biliary ductal dilatation. Pancreas:  No mass or inflammatory changes. Spleen: Within normal limits in size and appearance. Adrenals/Urinary Tract: No  suspicious masses identified. No evidence of ureteral calculi or hydronephrosis. Stomach/Bowel: No evidence of obstruction, inflammatory process or abnormal fluid collections. Normal appendix visualized. Diverticulosis is seen mainly involving the sigmoid colon, however there is no evidence of diverticulitis. Vascular/Lymphatic: No pathologically enlarged lymph nodes. No acute vascular findings. Reproductive: Prior hysterectomy noted. Adnexal regions are unremarkable in appearance. Other:  None. Musculoskeletal:  No suspicious bone lesions identified. IMPRESSION: Colonic diverticulosis. No radiographic evidence of diverticulitis or other acute findings. Electronically Signed   By: Danae Orleans M.D.   On: 05/14/2023 15:32     PROCEDURES:  Critical Care performed: No  Procedures   MEDICATIONS ORDERED IN ED: Medications  ondansetron (ZOFRAN) injection 4 mg (4 mg Intravenous Given 05/14/23 1402)  sodium chloride 0.9 % bolus 500 mL (0 mLs Intravenous Stopped 05/14/23 1605)  iohexol (OMNIPAQUE) 300 MG/ML solution 80 mL (80 mLs Intravenous Contrast Given 05/14/23 1459)     IMPRESSION / MDM / ASSESSMENT AND PLAN / ED COURSE  I reviewed the triage vital signs and the nursing notes.                              Differential diagnosis includes but is not limited to, abdominal perforation, aortic dissection, cholecystitis, appendicitis, diverticulitis, colitis, esophagitis/gastritis, kidney stone, pyelonephritis, urinary tract infection, aortic aneurysm. All are considered in decision and treatment plan. Based upon the patient's presentation and risk factors, given the patient's relatively reassuring exam nontoxic appearance, I am most suspicious may related to her iron infusion or mild self-limited illness although consideration for other causes such as diverticulitis etc. is given.  Workup reassuring urinalysis normal mild anemia that is improving from previous check.  Metabolic panel normal.   Patient's  presentation is most consistent with acute complicated illness / injury requiring diagnostic workup.   Patient with no emesis or diarrhea while in the emergency department.  Discussed with the patient she is feeling comfortable at this time, comfortable plan for discharge.  She does relate a history of chronic mild cough has tried Psychiatrist for, seen her primary care, reports she has already had plenty of x-rays in the past, and is requesting a short course of prescription cough medicine for which she reports she has used in the past that is helpful.  She has no acute pulmonary symptomatology other than a cough.  She does reports she used to take a blood pressure medicine that had been determined to be a cause, but is no longer taking that.  At this juncture, I think is reasonable to provide her small dose of Tussionex.  Patient comfortable with plan not driving while taking, and understands to only use of all  prescribed   Return precautions and treatment recommendations and follow-up discussed with the patient who is agreeable with the plan.         FINAL CLINICAL IMPRESSION(S) / ED DIAGNOSES   Final diagnoses:  Nausea vomiting and diarrhea  Chronic cough     Rx / DC Orders   ED Discharge Orders          Ordered    ondansetron (ZOFRAN-ODT) 4 MG disintegrating tablet  Every 6 hours PRN        05/14/23 1606    chlorpheniramine-HYDROcodone (TUSSIONEX) 10-8 MG/5ML  Every 12 hours PRN        05/14/23 1631             Note:  This document was prepared using Dragon voice recognition software and may include unintentional dictation errors.   Sharyn Creamer, MD 05/14/23 [phone]

## 2023-05-15 ENCOUNTER — Inpatient Hospital Stay: Payer: Medicare HMO

## 2023-05-15 NOTE — Group Note (Deleted)

## 2023-05-17 ENCOUNTER — Ambulatory Visit
Admission: RE | Admit: 2023-05-17 | Discharge: 2023-05-17 | Disposition: A | Discharge status: Home / Self Care | Payer: Medicare HMO | Source: Ambulatory Visit | Attending: Internal Medicine | Admitting: Internal Medicine

## 2023-05-17 DIAGNOSIS — R928 Other abnormal and inconclusive findings on diagnostic imaging of breast: Secondary | ICD-10-CM | POA: Insufficient documentation

## 2023-05-17 DIAGNOSIS — N6489 Other specified disorders of breast: Secondary | ICD-10-CM

## 2023-05-17 DIAGNOSIS — R921 Mammographic calcification found on diagnostic imaging of breast: Secondary | ICD-10-CM | POA: Diagnosis not present

## 2023-05-17 DIAGNOSIS — R92333 Mammographic heterogeneous density, bilateral breasts: Secondary | ICD-10-CM | POA: Diagnosis not present

## 2023-05-17 DIAGNOSIS — N6320 Unspecified lump in the left breast, unspecified quadrant: Secondary | ICD-10-CM | POA: Diagnosis not present

## 2023-05-18 ENCOUNTER — Telehealth: Payer: Self-pay | Admitting: *Deleted

## 2023-05-18 NOTE — Telephone Encounter (Signed)
Patient just called reporting that she had an infusion last week and she was fine until Sat when she became nauseated, but she doe snot feel it was the infusion causing it. Looking at her chart, she went to ER on 9/8 with N/V/Diarrhea and CT showed Diverticulosis IMPRESSION: Colonic diverticulosis. No radiographic evidence of diverticulitis or other acute findings. Electronically Signed   By: Danae Orleans M.D.   On: 05/14/2023 15:32 She states she has another Iron infusion tomorrow and was told to let you know about the nausea

## 2023-05-19 ENCOUNTER — Other Ambulatory Visit: Payer: Self-pay | Admitting: Family Medicine

## 2023-05-19 ENCOUNTER — Other Ambulatory Visit: Payer: Self-pay | Admitting: Internal Medicine

## 2023-05-19 ENCOUNTER — Inpatient Hospital Stay: Payer: Medicare HMO

## 2023-05-19 ENCOUNTER — Encounter: Payer: Self-pay | Admitting: Oncology

## 2023-05-19 ENCOUNTER — Encounter: Payer: Self-pay | Admitting: Pharmacist

## 2023-05-19 VITALS — BP 137/79 | HR 70 | Temp 97.4°F | Resp 18

## 2023-05-19 DIAGNOSIS — D509 Iron deficiency anemia, unspecified: Secondary | ICD-10-CM | POA: Diagnosis not present

## 2023-05-19 DIAGNOSIS — I10 Essential (primary) hypertension: Secondary | ICD-10-CM

## 2023-05-19 DIAGNOSIS — E782 Mixed hyperlipidemia: Secondary | ICD-10-CM

## 2023-05-19 MED ORDER — SODIUM CHLORIDE 0.9 % IV SOLN
Freq: Once | INTRAVENOUS | Status: AC
  Filled 2023-05-19: qty 250

## 2023-05-19 MED ORDER — SODIUM CHLORIDE 0.9 % IV SOLN
200.0000 mg | Freq: Once | INTRAVENOUS | Status: AC
  Administered 2023-05-19: 200 mg via INTRAVENOUS
  Filled 2023-05-19: qty 200

## 2023-05-19 NOTE — Progress Notes (Signed)
Pt has been educated and understands. Pt declined to stay 30 mins after iron infusion. VSS.

## 2023-05-19 NOTE — Telephone Encounter (Signed)
CAll returned to patient and voice mail answered and I left message for her to take a Zofran tablet prior to her iron infusion and to let me know if sh needs a refill and we will send it in

## 2023-05-19 NOTE — Telephone Encounter (Signed)
Requested Prescriptions  Pending Prescriptions Disp Refills   atorvastatin (LIPITOR) 10 MG tablet [Pharmacy Med Name: ATORVASTATIN 10 MG TABLET] 45 tablet 0    Sig: TAKE 1 TABLET BY MOUTH EVERY OTHER DAY     Cardiovascular:  Antilipid - Statins Failed - 05/19/2023  8:58 AM      Failed - Lipid Panel in normal range within the last 12 months    Cholesterol  Date Value Ref Range Status  01/03/2023 141 <200 mg/dL Final   LDL Cholesterol (Calc)  Date Value Ref Range Status  01/03/2023 66 mg/dL (calc) Final    Comment:    Reference range: <100 . Desirable range <100 mg/dL for primary prevention;   <70 mg/dL for patients with CHD or diabetic patients  with > or = 2 CHD risk factors. Marland Kitchen LDL-C is now calculated using the Martin-Hopkins  calculation, which is a validated novel method providing  better accuracy than the Friedewald equation in the  estimation of LDL-C.  Horald Pollen et al. Lenox Ahr. 6045;409(81): 2061-2068  (http://education.[employer].com/faq/FAQ164)    HDL  Date Value Ref Range Status  01/03/2023 61 > OR = 50 mg/dL Final   Triglycerides  Date Value Ref Range Status  01/03/2023 64 <150 mg/dL Final         Passed - Patient is not pregnant      Passed - Valid encounter within last 12 months    Recent Outpatient Visits           3 weeks ago Iron deficiency anemia, unspecified iron deficiency anemia type   [facility] Danelle Berry, PA-C   4 months ago Lower abdominal pain   [facility] [place], Sheliah Mends, PA-C   4 months ago Benign essential HTN   [facility] Margarita Mail, DO   5 months ago Generalized abdominal pain   [facility] M[facility]um, Oswaldo Conroy, PA-C   5 months ago Generalized abdominal pain   [facility] Margarita Mail, DO       Future Appointments             In 4 days Margarita Mail, DO [place], [facility]   In 1 week Carman Ching, PA-C [facility] [place]   In 1 month Margarita Mail, DO [facility], [facility]            Refused Prescriptions Disp Refills   losartan (COZAAR) 50 MG tablet [Pharmacy Med Name: LOSARTAN POTASSIUM 50 MG TAB] 90 tablet 1    Sig: TAKE 1 TABLET BY MOUTH DAILY     Cardiovascular:  Angiotensin Receptor Blockers Failed - 05/19/2023  8:58 AM      Failed - Last BP in normal range    BP Readings from Last 1 Encounters:  05/19/23 137/79         Passed - Cr in normal range and within 180 days    Creat  Date Value Ref Range Status  04/26/2023 0.94 0.60 - 1.00 mg/dL Final   Creatinine, Ser  Date Value Ref Range Status  05/14/2023 0.87 0.44 - 1.00 mg/dL Final   Creatinine, Urine  Date Value Ref Range Status  10/13/2017 184 20 - 275 mg/dL Final         Passed - K in normal range and within 180 days    Potassium  Date Value Ref Range Status  05/14/2023 3.6 3.5 - 5.1 mmol/L Final  06/03/2012 3.9 3.5 - 5.1 mmol/L Final         Passed - Patient is not pregnant      Passed - Valid encounter within last 6 months    Recent Outpatient Visits           3 weeks ago Iron deficiency anemia, unspecified iron deficiency anemia type   [facility] Danelle Berry, PA-C   4 months ago Lower abdominal pain   [facility] Danelle Berry, PA-C   4 months ago Benign essential HTN   [facility] Margarita Mail, DO   5 months ago Generalized abdominal pain   [place] [facility]um, Oswaldo Conroy, PA-C   5 months ago Generalized abdominal pain   [facility] Margarita Mail, DO       Future Appointments             In 4 days Margarita Mail, DO [facility], [facility]   In 1 week Carman Ching, PA-C [facility] [place]   In 1 month  Margarita Mail, DO [facility]

## 2023-05-22 ENCOUNTER — Inpatient Hospital Stay: Payer: Medicare HMO

## 2023-05-22 ENCOUNTER — Telehealth: Payer: Self-pay | Admitting: *Deleted

## 2023-05-22 NOTE — Progress Notes (Unsigned)
Established Patient Office Visit  Subjective   Patient ID: Sonya Harper, female    DOB: 02-11-1946  Age: 77 y.o. MRN: [id number]  No chief complaint on file.   HPI  Patient is here to ER follow up.   Discharge Date: 05/14/23 Diagnosis: nausea/vomiting  Procedures/tests: Urine negative, labs negative. CT A/P with colonic diverticulosis, no acute findings  Consultants: *** New medications: Zofran Discontinued medications: *** Discharge instructions:  *** Status: {Blank multiple:19196::"better","worse","stable","fluctuating"}   Hypertension/A.fib: -Medications: Amlodipine 5, Losartan 50 mg, Eliquis 5 mg BID -Patient is compliant with above medications and reports no side effects. -Checking BP at home (average): Not checking  -Denies any acute vision changes, LE edema or symptoms of hypotension -Follows with Cardiology, note from 10/17/22  reviewed. Had been on Sotalol for rhythm control in the past, however this was discontinued as she did not have reoccurrence of symptoms.  Nuclear stress test in 2018 was unremarkable.  TEE from 3/22 unremarkable as well with a normal EF.  HLD: -Medications: Lipitor 10 mg every other day  -Patient is compliant with above medications and reports no side effects.  -Last lipid panel: Lipid Panel     Component Value Date/Time   CHOL 141 01/03/2023 1121   TRIG 64 01/03/2023 1121   HDL 61 01/03/2023 1121   CHOLHDL 2.3 01/03/2023 1121   LDLCALC 66 01/03/2023 1121   Hx of Pre-Diabetes: -Last A1c 6.0% 5/23 -Not currently on any medication  Asthma:  -Asthma status: controlled -Current Treatments: Breo-Ellipta 200 daily, albuterol PRN -Satisfied with current treatment?: yes -Albuterol/rescue inhaler frequency: Occasionally -Dyspnea frequency: Occasionally -Wheezing frequency: Occasionally, not recently -Cough frequency: Daily, nonproductive -Limitation of activity: no -Current upper respiratory symptoms: no -Visits to ER or Urgent Care  in past year: no -Pneumovax:  Prevnar 23 in 2018, 13 in 2016  - discussed Prevnar 20, will discuss again in the fall patient wants to wait -Influenza: Up to Date  GERD: -Currently on Protonix 40 mg, controls symptoms most of the time  -Had EGD 1/21, which did show gastritis, negative for dysplasia  Hypothyroidism: -Medications: Levothyroxine 50 mcg  -Patient is compliant with the above medication (s) at the above dose and reports no medication side effects.  -Denies weight changes, cold./heat intolerance, skin changes, anxiety/palpitations  -Last TSH: 1.172 9/23 -Follows with Endocrinology, note from 07/18/22 reviewed, seen on annual basis.   Insomnia:  -Currently on Trazodone 100 mg, doing well   Health Maintenance: -Blood work due -Mammogram 8/23 -Colonoscopy 09/20/19 - repeat in 5 years  Review of Systems  Constitutional:  Negative for chills and fever.  Respiratory:  Negative for shortness of breath and wheezing.   Cardiovascular:  Negative for chest pain.  Gastrointestinal:  Positive for abdominal pain. Negative for heartburn, nausea and vomiting.  Neurological:  Negative for headaches.      Objective:     There were no vitals taken for this visit. BP Readings from Last 3 Encounters:  05/19/23 137/79  05/14/23 (!) 177/81  05/12/23 (!) 166/81   Wt Readings from Last 3 Encounters:  05/14/23 161 lb (73 kg)  05/05/23 161 lb 3.2 oz (73.1 kg)  04/24/23 161 lb 14.4 oz (73.4 kg)      Physical Exam Constitutional:      Appearance: Normal appearance.  HENT:     Head: Normocephalic and atraumatic.  Cardiovascular:     Rate and Rhythm: Normal rate and regular rhythm.  Pulmonary:     Effort: Pulmonary effort is normal.  Breath sounds: Normal breath sounds.  Musculoskeletal:     Right lower leg: No edema.     Left lower leg: No edema.  Skin:    General: Skin is warm and dry.  Neurological:     General: No focal deficit present.     Mental Status: She is  alert. Mental status is at baseline.  Psychiatric:        Mood and Affect: Mood normal.        Behavior: Behavior normal.      No results found for any visits on 05/23/23.   Last CBC Lab Results  Component Value Date   WBC 5.8 05/14/2023   HGB 11.9 (L) 05/14/2023   HCT 39.3 05/14/2023   MCV 80.2 05/14/2023   MCH 24.3 (L) 05/14/2023   RDW 21.2 (H) 05/14/2023   PLT 285 05/14/2023   Last metabolic panel Lab Results  Component Value Date   GLUCOSE 98 05/14/2023   NA 138 05/14/2023   K 3.6 05/14/2023   CL 105 05/14/2023   CO2 25 05/14/2023   BUN 14 05/14/2023   CREATININE 0.87 05/14/2023   EGFR 63 04/26/2023   CALCIUM 8.5 (L) 05/14/2023   PHOS 3.5 02/26/2018   PROT 6.8 05/14/2023   ALBUMIN 3.7 05/14/2023   BILITOT 0.7 05/14/2023   ALKPHOS 86 05/14/2023   AST 19 05/14/2023   ALT 15 05/14/2023   ANIONGAP 8 05/14/2023   Last lipids Lab Results  Component Value Date   CHOL 141 01/03/2023   HDL 61 01/03/2023   LDLCALC 66 01/03/2023   TRIG 64 01/03/2023   CHOLHDL 2.3 01/03/2023   Last hemoglobin A1c Lab Results  Component Value Date   HGBA1C 6.0 (H) 01/03/2023   Last thyroid functions Lab Results  Component Value Date   TSH 1.73 04/26/2023   Last vitamin D Lab Results  Component Value Date   VD25OH 49 04/26/2023   Last vitamin B12 and Folate Lab Results  Component Value Date   VITAMINB12 412 04/26/2023      The 10-year ASCVD risk score (Arnett DK, et al., 2019) is: 12.9%    Assessment & Plan:   1. Benign essential HTN/Paroxysmal atrial fibrillation [facility]): Chronic and stable. Following with Cardiology, seen last in February. Labs due. Continue current medications which include Amlodipine 5, Losartan 50 mg, Eliquis 5 mg BID. Eliquis refilled.  - CBC w/Diff/Platelet - COMPLETE METABOLIC PANEL WITH GFR - apixaban (ELIQUIS) 5 MG TABS tablet; Take 1 tablet (5 mg total) by mouth 2 (two) times daily.  Dispense: 180 tablet; Refill: 3  2.  Hyperlipidemia, mixed: Stable, recheck lipid panel today. Continue Lipitor 10 mg every other day.   - Lipid Profile  3. Mild persistent asthma without complication: Symptoms stable, continue Breo daily and Albuterol PRN, refilled.  - albuterol (VENTOLIN HFA) 108 (90 Base) MCG/ACT inhaler; Inhale 2 puffs into the lungs every 6 (six) hours as needed for wheezing or shortness of breath.  Dispense: 1 each; Refill: 2 - fluticasone furoate-vilanterol (BREO ELLIPTA) 200-25 MCG/ACT AEPB; Inhale 1 puff into the lungs daily.  Dispense: 1 each; Refill: 11  4. Gastroesophageal reflux disease without esophagitis: Stable, doing well on Protonix 40 mg, refilled.  - pantoprazole (PROTONIX) 40 MG tablet; Take 1 tablet (40 mg total) by mouth daily.  Dispense: 90 tablet; Refill: 1  5. Prediabetes: Recheck A1c today.  - HgB A1c  6. Insomnia, unspecified type: Stable, doing well on Trazodone 100 mg.  7. Vitamin D deficiency: Check Vitamin  D level, not currently on supplements.   - Vitamin D (25 hydroxy)   No follow-ups on file.    Margarita Mail, DO

## 2023-05-22 NOTE — Progress Notes (Signed)
Care Coordination   Note   05/22/2023 Name: [name] MRN: [id number] DOB: [birth year]  Sonya Harper is a 77 y.o. year old female who sees Margarita Mail, DO for primary care. I reached out to Genella Rife by phone today to offer care coordination services.  Sonya Harper was given information about Care Coordination services today including:   The Care Coordination services include support from the care team which includes your Nurse Coordinator, Clinical Social Worker, or Pharmacist.  The Care Coordination team is here to help remove barriers to the health concerns and goals most important to you. Care Coordination services are voluntary, and the patient may decline or stop services at any time by request to their care team member.   Care Coordination Consent Status: Patient did not agree to participate in care coordination services at this time.  Follow up plan:  pt appreciative but declined need for services at this time   Encounter Outcome:  Patient Refused  Burman Nieves, [facility] Care Coordination Care Guide Direct Dial: [phone]

## 2023-05-23 ENCOUNTER — Encounter: Payer: Self-pay | Admitting: Internal Medicine

## 2023-05-23 ENCOUNTER — Ambulatory Visit (INDEPENDENT_AMBULATORY_CARE_PROVIDER_SITE_OTHER): Payer: Medicare HMO | Admitting: Internal Medicine

## 2023-05-23 VITALS — BP 118/68 | HR 96 | Temp 98.1°F | Resp 18 | Ht 65.0 in | Wt 160.7 lb

## 2023-05-23 DIAGNOSIS — K219 Gastro-esophageal reflux disease without esophagitis: Secondary | ICD-10-CM

## 2023-05-23 DIAGNOSIS — I10 Essential (primary) hypertension: Secondary | ICD-10-CM | POA: Diagnosis not present

## 2023-05-23 DIAGNOSIS — E782 Mixed hyperlipidemia: Secondary | ICD-10-CM | POA: Diagnosis not present

## 2023-05-23 DIAGNOSIS — D509 Iron deficiency anemia, unspecified: Secondary | ICD-10-CM | POA: Diagnosis not present

## 2023-05-23 DIAGNOSIS — G4700 Insomnia, unspecified: Secondary | ICD-10-CM

## 2023-05-23 DIAGNOSIS — J4530 Mild persistent asthma, uncomplicated: Secondary | ICD-10-CM | POA: Diagnosis not present

## 2023-05-23 MED ORDER — ALBUTEROL SULFATE HFA 108 (90 BASE) MCG/ACT IN AERS
2.0000 | INHALATION_SPRAY | Freq: Four times a day (QID) | RESPIRATORY_TRACT | 2 refills | Status: DC | PRN
Start: 2023-05-23 — End: 2023-12-25

## 2023-05-23 MED ORDER — AMLODIPINE BESYLATE 5 MG PO TABS
5.0000 mg | ORAL_TABLET | Freq: Every day | ORAL | 1 refills | Status: DC
Start: 2023-05-23 — End: 2023-12-25

## 2023-05-23 MED ORDER — LOSARTAN POTASSIUM 100 MG PO TABS
100.0000 mg | ORAL_TABLET | Freq: Every day | ORAL | 1 refills | Status: DC
Start: 2023-05-23 — End: 2023-12-06

## 2023-05-23 MED ORDER — ATORVASTATIN CALCIUM 10 MG PO TABS
10.0000 mg | ORAL_TABLET | ORAL | 1 refills | Status: DC
Start: 2023-05-23 — End: 2023-11-09

## 2023-05-23 MED ORDER — PANTOPRAZOLE SODIUM 40 MG PO TBEC
40.0000 mg | DELAYED_RELEASE_TABLET | Freq: Two times a day (BID) | ORAL | 1 refills | Status: DC
Start: 2023-05-23 — End: 2023-07-05

## 2023-05-23 MED ORDER — TRAZODONE HCL 100 MG PO TABS
100.0000 mg | ORAL_TABLET | Freq: Every day | ORAL | 1 refills | Status: DC
Start: 2023-05-23 — End: 2023-11-16

## 2023-05-26 ENCOUNTER — Inpatient Hospital Stay: Payer: Medicare HMO

## 2023-05-26 VITALS — BP 153/72 | HR 68 | Temp 98.5°F

## 2023-05-26 DIAGNOSIS — D509 Iron deficiency anemia, unspecified: Secondary | ICD-10-CM | POA: Diagnosis not present

## 2023-05-26 MED ORDER — SODIUM CHLORIDE 0.9 % IV SOLN
Freq: Once | INTRAVENOUS | Status: AC
  Filled 2023-05-26: qty 250

## 2023-05-26 MED ORDER — SODIUM CHLORIDE 0.9 % IV SOLN
200.0000 mg | Freq: Once | INTRAVENOUS | Status: AC
  Administered 2023-05-26: 200 mg via INTRAVENOUS
  Filled 2023-05-26: qty 200

## 2023-05-29 ENCOUNTER — Inpatient Hospital Stay: Payer: Medicare HMO

## 2023-05-29 ENCOUNTER — Encounter: Payer: Self-pay | Admitting: Physician Assistant

## 2023-05-29 ENCOUNTER — Ambulatory Visit (INDEPENDENT_AMBULATORY_CARE_PROVIDER_SITE_OTHER): Payer: Medicare HMO | Admitting: Physician Assistant

## 2023-05-29 VITALS — BP 155/87 | HR 70

## 2023-05-29 DIAGNOSIS — N3281 Overactive bladder: Secondary | ICD-10-CM | POA: Diagnosis not present

## 2023-05-29 LAB — BLADDER SCAN AMB NON-IMAGING: Scan Result: 0

## 2023-05-29 MED ORDER — GEMTESA 75 MG PO TABS: 1.0000 | ORAL_TABLET | Freq: Every day | ORAL | 11 refills | Status: AC

## 2023-05-29 NOTE — Progress Notes (Signed)
05/29/2023 1:07 PM   Sonya Harper 1945/09/23 [id number]  CC: Chief Complaint  Patient presents with   Over Active Bladder   HPI: Sonya Harper is a 77 y.o. female with PMH fibromyalgia, chronic lower abdominal pain, and OAB/nocturia who presents today for recheck on Gemtesa.   Today she reports Sonya Harper is helping.  She denies daytime frequency and states her nocturia is improved to x 2-3.  She denies dysuria or gross hematuria.  She would like to continue [name].  PVR 0 mL.  Notably, she canceled the sleep study Dr. Richardo Hanks referred her for.  PMH: Past Medical History:  Diagnosis Date   A-fib [facility])    Allergy    Arthritis    feet   Asthma    Breast neoplasm    Chronic lower back pain    s/p fall injury   Depression    Dysrhythmia    Fibromyalgia    Hypertension    Hypothyroidism    Lumbar facet arthropathy 11/06/2018   Prediabetes 02/27/2018   Sciatica of right side    Wears dentures    partial upper and lower    Surgical History: Past Surgical History:  Procedure Laterality Date   ABDOMINAL HYSTERECTOMY     BIOPSY  04/06/2023   Procedure: BIOPSY;  Surgeon: Midge Minium, MD;  Location: [facility] ENDOSCOPY;  Service: Endoscopy;;   CATARACT EXTRACTION W/PHACO Right 02/02/2022   Procedure: CATARACT EXTRACTION PHACO AND INTRAOCULAR LENS PLACEMENT (IOC) RIGHT;  Surgeon: Lockie Mola, MD;  [facility];  Service: Ophthalmology;  Laterality: Right;  4.52 00:45.9   CATARACT EXTRACTION W/PHACO Left 02/16/2022   Procedure: CATARACT EXTRACTION PHACO AND INTRAOCULAR LENS PLACEMENT (IOC) LEFT 7.05 01:19.8;  Surgeon: Lockie Mola, MD;  Location: [facility];  Service: Ophthalmology;  Laterality: Left;   COLONOSCOPY WITH PROPOFOL N/A 05/13/2016   Procedure: COLONOSCOPY WITH PROPOFOL;  Surgeon: Midge Minium, MD;  Location: [facility];  Service: Endoscopy;  Laterality: N/A;   COLONOSCOPY WITH PROPOFOL N/A 09/20/2019   Procedure:  COLONOSCOPY WITH PROPOFOL;  Surgeon: Midge Minium, MD;  Location: [facility] END[facility]COPY;  Service: Endoscopy;  Laterality: N/A;   COLONOSCOPY WITH PROPOFOL N/A 04/06/2023   Procedure: COLONOSCOPY WITH PROPOFOL;  Surgeon: Midge Minium, MD;  Location: [facility] END[facility]COPY;  Service: Endoscopy;  Laterality: N/A;   ELECTROPHYSIOLOGIC STUDY     Ablation   ESOPHAGOGASTRODUODENOSCOPY (EGD) WITH PROPOFOL N/A 09/20/2019   Procedure: ESOPHAGOGASTRODUODENOSCOPY (EGD) WITH PROPOFOL;  Surgeon: Midge Minium, MD;  Location: [facility] ENDOSCOPY;  Service: Endoscopy;  Laterality: N/A;   ESOPHAGOGASTRODUODENOSCOPY (EGD) WITH PROPOFOL N/A 04/06/2023   Procedure: ESOPHAGOGASTRODUODENOSCOPY (EGD) WITH PROPOFOL;  Surgeon: Midge Minium, MD;  Location: [facility] ENDOSCOPY;  Service: Endoscopy;  Laterality: N/A;   GIVENS CAPSULE STUDY N/A 05/02/2023   Procedure: GIVENS CAPSULE STUDY;  Surgeon: Midge Minium, MD;  Location: [facility] ENDOSCOPY;  Service: Endoscopy;  Laterality: N/A;   POLYPECTOMY N/A 05/13/2016   Procedure: POLYPECTOMY;  Surgeon: Midge Minium, MD;  Location: [facility];  Service: Endoscopy;  Laterality: N/A;   POLYPECTOMY  04/06/2023   Procedure: POLYPECTOMY;  Surgeon: Midge Minium, MD;  Location: [facility] ENDOSCOPY;  Service: Endoscopy;;    Home Medications:  Allergies as of 05/29/2023       Reactions   Molds & Smuts Anaphylaxis   Penicillins Nausea And Vomiting, Rash   Has patient had a PCN reaction causing immediate rash, facial/tongue/throat swelling, SOB or lightheadedness with hypotension: Yes Has patient had a PCN reaction causing severe rash involving mucus membranes  or skin necrosis: No Has patient had a PCN reaction that required hospitalization: No Has patient had a PCN reaction occurring within the last 10 years: No If all of the above answers are "NO", then may proceed with Cephalosporin use.         Medication List        Accurate as of May 29, 2023  1:07 PM. If you have any questions, ask your nurse  or doctor.          albuterol 108 (90 Base) MCG/ACT inhaler Commonly known as: VENTOLIN HFA Inhale 2 puffs into the lungs every 6 (six) hours as needed for wheezing or shortness of breath.   amLODipine 5 MG tablet Commonly known as: NORVASC Take 1 tablet (5 mg total) by mouth daily.   apixaban 5 MG Tabs tablet Commonly known as: ELIQUIS Take 1 tablet (5 mg total) by mouth 2 (two) times daily.   atorvastatin 10 MG tablet Commonly known as: LIPITOR Take 1 tablet (10 mg total) by mouth every other day.   Benadryl Itch Stopping 2 % Gel Generic drug: DIPHENHYDRAMINE HCL (TOPICAL) Apply 1 application topically in the morning, at noon, and at bedtime.   Blood Pressure Monitor/L Cuff Misc 1 each by Does not apply route daily.   chlorpheniramine-HYDROcodone 10-8 MG/5ML Commonly known as: TUSSIONEX Take 2.5 mLs by mouth every 12 (twelve) hours as needed for cough.   EPINEPHrine 0.3 mg/0.3 mL Soaj injection Commonly known as: EPI-PEN INJECT INTO THE MIDDLE OF THE OUTER THIGH AND HOLD FOR 10 SECONDS AS NEEDED FOR SEVERE ALLERGIC REACTION THEN CALL 911 IF USED   fluticasone 50 MCG/ACT nasal spray Commonly known as: FLONASE Place 2 sprays into both nostrils daily.   fluticasone furoate-vilanterol 200-25 MCG/ACT Aepb Commonly known as: BREO ELLIPTA Inhale 1 puff into the lungs daily.   Gemtesa 75 MG Tabs Generic drug: Vibegron Take 1 tablet (75 mg total) by mouth daily.   Iron (Ferrous Sulfate) 325 (65 Fe) MG Tabs Take 325 mg by mouth daily.   loratadine 10 MG tablet Commonly known as: CLARITIN Take 1 tablet (10 mg total) by mouth daily.   losartan 100 MG tablet Commonly known as: COZAAR Take 1 tablet (100 mg total) by mouth daily.   mometasone 0.1 % cream Commonly known as: ELOCON Apply once daily up to 5 days a week for eczema flares   montelukast 10 MG tablet Commonly known as: SINGULAIR TAKE 1 TABLET BY MOUTH AT BEDTIME   mupirocin ointment 2 % Commonly known  as: BACTROBAN Apply 1 Application topically daily. Qd to excision site   nitroGLYCERIN 0.4 MG SL tablet Commonly known as: NITROSTAT Place 1 tablet (0.4 mg total) under the tongue every 5 (five) minutes as needed for chest pain. Max 3 pills per episode   ondansetron 4 MG disintegrating tablet Commonly known as: ZOFRAN-ODT Take 1 tablet (4 mg total) by mouth every 6 (six) hours as needed for nausea or vomiting.   pantoprazole 40 MG tablet Commonly known as: PROTONIX Take 1 tablet (40 mg total) by mouth 2 (two) times daily.   Synthroid 50 MCG tablet Generic drug: levothyroxine TAKE 1 BY MOUTH DAILY. DO NOT SUBSTITUTE   traZODone 100 MG tablet Commonly known as: DESYREL Take 1 tablet (100 mg total) by mouth at bedtime.   Vitamin D-1000 Max St 25 MCG (1000 UT) tablet Generic drug: Cholecalciferol Take by mouth.        Allergies:  Allergies  Allergen Reactions   Molds &  Smuts Anaphylaxis   Penicillins Nausea And Vomiting and Rash    Has patient had a PCN reaction causing immediate rash, facial/tongue/throat swelling, SOB or lightheadedness with hypotension: Yes Has patient had a PCN reaction causing severe rash involving mucus membranes or skin necrosis: No Has patient had a PCN reaction that required hospitalization: No Has patient had a PCN reaction occurring within the last 10 years: No If all of the above answers are "NO", then may proceed with Cephalosporin use.     Family History: Family History  Problem Relation Age of Onset   Stroke Mother    Breast cancer Sister 27       breast ca x3   Colon cancer Paternal Aunt    Cancer Sister     Social History:   reports that she has never smoked. She has never been exposed to tobacco smoke. She has never used smokeless tobacco. She reports that she does not drink alcohol and does not use drugs.  Physical Exam: There were no vitals taken for this visit.  Constitutional:  Alert and oriented, no acute distress, nontoxic  appearing HEENT: [place], AT Cardiovascular: No clubbing, cyanosis, or edema Respiratory: Normal respiratory effort, no increased work of breathing Skin: No rashes, bruises or suspicious lesions Neurologic: Grossly intact, no focal deficits, moving all 4 extremities Psychiatric: Normal mood and affect  Laboratory Data: Results for orders placed or performed in visit on 05/29/23  BLADDER SCAN AMB NON-IMAGING  Result Value Ref Range   Scan Result 0    Assessment & Plan:   1. OAB (overactive bladder) Symptoms well-controlled on Gemtesa, though she is still having some nocturia.  Will continue Gemtesa.  Would recommend rescheduling sleep study in the future if nocturia becomes more bothersome. - Vibegron (GEMTESA) 75 MG TABS; Take 1 tablet (75 mg total) by mouth daily.  Dispense: 30 tablet; Refill: 11 - BLADDER SCAN AMB NON-IMAGING   Return in about 1 year (around 05/28/2024) for Annual OAB f/u with [facility].  Carman Ching, PA-C  [facility]  [address], [address] [place], [place] [address] [phone]

## 2023-05-30 ENCOUNTER — Telehealth: Payer: Self-pay

## 2023-05-30 NOTE — Telephone Encounter (Signed)
Still have no received results or report for this pt's capsule study... Can you please send me a message with interpretation?

## 2023-05-31 ENCOUNTER — Other Ambulatory Visit: Payer: Self-pay | Admitting: Internal Medicine

## 2023-05-31 DIAGNOSIS — I2089 Other forms of angina pectoris: Secondary | ICD-10-CM

## 2023-05-31 NOTE — Telephone Encounter (Signed)
Medication Refill - Medication: nitroGLYCERIN (NITROSTAT) 0.4 MG SL tablet   Has the patient contacted their pharmacy? Yes.   It has been since 2019 that she had this refilled.   Preferred Pharmacy (with phone number or street name): [facility] [id number] [place], [place] - [address]  Has the patient been seen for an appointment in the last year OR does the patient have an upcoming appointment? Yes.    Agent: Please be advised that RX refills may take up to 3 business days. We ask that you follow-up with your pharmacy.

## 2023-06-01 ENCOUNTER — Encounter: Payer: Self-pay | Admitting: Gastroenterology

## 2023-06-01 MED ORDER — NITROGLYCERIN 0.4 MG SL SUBL: 0.4000 mg | SUBLINGUAL_TABLET | SUBLINGUAL | 3 refills | Status: AC | PRN

## 2023-06-01 NOTE — Telephone Encounter (Signed)
Patient daughter Sonya Harper) called wanting to know about the test result. I inform her that the result is not available at the moment and the last she might get a call is Monday if the result is available.

## 2023-06-01 NOTE — Telephone Encounter (Signed)
I spoke to pt and she reports ongoing intermittent lower abd cramping and wanted to know if there is anything she can take to help with this... Pt denies any other Sx and reports her BM have been normal and daily

## 2023-06-01 NOTE — Telephone Encounter (Signed)
Requested medication (s) are due for refill today: expired medication   Requested medication (s) are on the active medication list: yes   Last refill:  08/30/2018 #50 3 refills   Future visit scheduled: yes 1 month   Notes to clinic:  expired medication . Do you want to renew Rx?     Requested Prescriptions  Pending Prescriptions Disp Refills   nitroGLYCERIN (NITROSTAT) 0.4 MG SL tablet 50 tablet 3    Sig: Place 1 tablet (0.4 mg total) under the tongue every 5 (five) minutes as needed for chest pain. Max 3 pills per episode     Cardiovascular:  Nitrates Failed - 05/31/2023 11:25 AM      Failed - Last BP in normal range    BP Readings from Last 1 Encounters:  05/29/23 (!) 155/87         Passed - Last Heart Rate in normal range    Pulse Readings from Last 1 Encounters:  05/29/23 70         Passed - Valid encounter within last 12 months    Recent Outpatient Visits           1 week ago Benign essential HTN   [facility] Margarita Mail, DO   1 month ago Iron deficiency anemia, unspecified iron deficiency anemia type   [facility] Danelle Berry, PA-C   4 months ago Lower abdominal pain   [facility] [place], Sheliah Mends, PA-C   4 months ago Benign essential HTN   [facility] Margarita Mail, DO   5 months ago Generalized abdominal pain   [facility] Mecum, Oswaldo Conroy, PA-C       Future Appointments             In 1 month Margarita Mail, DO [facility]   In 12 months Richardo Hanks, Laurette Schimke, MD [facility]

## 2023-06-02 ENCOUNTER — Inpatient Hospital Stay: Payer: Medicare HMO

## 2023-06-02 NOTE — Telephone Encounter (Signed)
Left message on voicemail.

## 2023-06-05 ENCOUNTER — Inpatient Hospital Stay: Payer: Medicare HMO

## 2023-06-05 VITALS — BP 160/80 | HR 88 | Temp 97.6°F

## 2023-06-05 DIAGNOSIS — D509 Iron deficiency anemia, unspecified: Secondary | ICD-10-CM | POA: Diagnosis not present

## 2023-06-05 MED ORDER — SODIUM CHLORIDE 0.9 % IV SOLN
200.0000 mg | Freq: Once | INTRAVENOUS | Status: AC
  Administered 2023-06-05: 200 mg via INTRAVENOUS
  Filled 2023-06-05: qty 200

## 2023-06-05 MED ORDER — SODIUM CHLORIDE 0.9 % IV SOLN
Freq: Once | INTRAVENOUS | Status: AC
  Filled 2023-06-05: qty 250

## 2023-06-05 NOTE — Patient Instructions (Signed)
Iron Sucrose Injection What is this medication? IRON SUCROSE (EYE ern SOO krose) treats low levels of iron (iron deficiency anemia) in people with kidney disease. Iron is a mineral that plays an important role in making red blood cells, which carry oxygen from your lungs to the rest of your body. This medicine may be used for other purposes; ask your health care provider or pharmacist if you have questions. COMMON BRAND NAME(S): Venofer What should I tell my care team before I take this medication? They need to know if you have any of these conditions: Anemia not caused by low iron levels Heart disease High levels of iron in the blood Kidney disease Liver disease An unusual or allergic reaction to iron, other medications, foods, dyes, or preservatives Pregnant or trying to get pregnant Breastfeeding How should I use this medication? This medication is for infusion into a vein. It is given in a hospital or clinic setting. Talk to your care team about the use of this medication in children. While this medication may be prescribed for children as young as 2 years for selected conditions, precautions do apply. Overdosage: If you think you have taken too much of this medicine contact a poison control center or emergency room at once. NOTE: This medicine is only for you. Do not share this medicine with others. What if I miss a dose? Keep appointments for follow-up doses. It is important not to miss your dose. Call your care team if you are unable to keep an appointment. What may interact with this medication? Do not take this medication with any of the following: Deferoxamine Dimercaprol Other iron products This medication may also interact with the following: Chloramphenicol Deferasirox This list may not describe all possible interactions. Give your health care provider a list of all the medicines, herbs, non-prescription drugs, or dietary supplements you use. Also tell them if you smoke,  drink alcohol, or use illegal drugs. Some items may interact with your medicine. What should I watch for while using this medication? Visit your care team regularly. Tell your care team if your symptoms do not start to get better or if they get worse. You may need blood work done while you are taking this medication. You may need to follow a special diet. Talk to your care team. Foods that contain iron include: whole grains/cereals, dried fruits, beans, or peas, leafy green vegetables, and organ meats (liver, kidney). What side effects may I notice from receiving this medication? Side effects that you should report to your care team as soon as possible: Allergic reactions--skin rash, itching, hives, swelling of the face, lips, tongue, or throat Low blood pressure--dizziness, feeling faint or lightheaded, blurry vision Shortness of breath Side effects that usually do not require medical attention (report to your care team if they continue or are bothersome): Flushing Headache Joint pain Muscle pain Nausea Pain, redness, or irritation at injection site This list may not describe all possible side effects. Call your doctor for medical advice about side effects. You may report side effects to FDA at 1-800-FDA-1088. Where should I keep my medication? This medication is given in a hospital or clinic. It will not be stored at home. NOTE: This sheet is a summary. It may not cover all possible information. If you have questions about this medicine, talk to your doctor, pharmacist, or health care provider.  2024 Elsevier/Gold Standard (2023-01-27 00:00:00)

## 2023-06-05 NOTE — Telephone Encounter (Signed)
Pt Is aware as instructed and expressed understanding, pt advised to f/u with PCP

## 2023-06-08 ENCOUNTER — Inpatient Hospital Stay: Payer: Medicare HMO

## 2023-07-03 NOTE — Progress Notes (Unsigned)
Established Patient Office Visit  Subjective   Patient ID: Sonya Harper, female    DOB: 03-26-1946  Age: 77 y.o. MRN: [id number]  No chief complaint on file.   HPI  Patient is here to follow up on chronic medical conditions.   Hypertension/A.fib: -Medications: Amlodipine 5 mg, Losartan 50 mg, Eliquis 5 mg BID -Patient is compliant with above medications and reports no side effects. -Checking BP at home (average): Not checking  -Denies any acute vision changes, LE edema or symptoms of hypotension -Follows with Cardiology, note from 10/17/22  reviewed. Had been on Sotalol for rhythm control in the past, however this was discontinued as she did not have reoccurrence of symptoms.  Nuclear stress test in 2018 was unremarkable.  TEE from 3/22 unremarkable as well with a normal EF.  HLD: -Medications: Lipitor 10 mg every other day  -Patient is compliant with above medications and reports no side effects.  -Last lipid panel: Lipid Panel     Component Value Date/Time   CHOL 141 01/03/2023 1121   TRIG 64 01/03/2023 1121   HDL 61 01/03/2023 1121   CHOLHDL 2.3 01/03/2023 1121   LDLCALC 66 01/03/2023 1121   Hx of Pre-Diabetes: -Last A1c 6.0% 4/24 -Not currently on any medication  Asthma:  -Asthma status: controlled -Current Treatments: Breo-Ellipta 200 daily, albuterol PRN -Satisfied with current treatment?: yes -Albuterol/rescue inhaler frequency: Occasionally -Dyspnea frequency: Occasionally -Wheezing frequency: Occasionally, not recently -Cough frequency: Daily, nonproductive -Limitation of activity: no -Current upper respiratory symptoms: no -Visits to ER or Urgent Care in past year: no -Pneumovax:  Prevnar 23 in 2018, 13 in 2016  - discussed Prevnar 20, will discuss again in the fall patient wants to wait -Influenza: Up to Date  GERD: -Currently on Protonix 40 mg, controls symptoms most of the time  -Had EGD 1/21, which did show gastritis, negative for dysplasia.    -Had a pill endoscopy in August, waiting for results.  Hypothyroidism: -Medications: Levothyroxine 50 mcg  -Patient is compliant with the above medication (s) at the above dose and reports no medication side effects.  -Denies weight changes, cold./heat intolerance, skin changes, anxiety/palpitations  -Last TSH: 1.73, 8/24 -Follows with Endocrinology, note from 07/18/22 reviewed, seen on annual basis.   Insomnia:  -Currently on Trazodone 100 mg, doing well   Health Maintenance: -Blood work UTD -Mammogram 9/24 Birads-2 -Colonoscopy 09/20/19 - repeat in 5 years  Review of Systems  Constitutional:  Positive for malaise/fatigue. Negative for chills and fever.  Respiratory:  Negative for shortness of breath and wheezing.   Cardiovascular:  Negative for chest pain.  Gastrointestinal:  Negative for abdominal pain, heartburn, nausea and vomiting.  Neurological:  Negative for headaches.      Objective:     There were no vitals taken for this visit. BP Readings from Last 3 Encounters:  06/05/23 (!) 160/80  05/29/23 (!) 155/87  05/26/23 (!) 153/72   Wt Readings from Last 3 Encounters:  05/23/23 160 lb 11.2 oz (72.9 kg)  05/14/23 161 lb (73 kg)  05/05/23 161 lb 3.2 oz (73.1 kg)      Physical Exam Constitutional:      Appearance: Normal appearance.  HENT:     Head: Normocephalic and atraumatic.  Cardiovascular:     Rate and Rhythm: Normal rate and regular rhythm.  Pulmonary:     Effort: Pulmonary effort is normal.     Breath sounds: Normal breath sounds.  Skin:    General: Skin is warm and dry.  Neurological:     General: No focal deficit present.     Mental Status: She is alert. Mental status is at baseline.  Psychiatric:        Mood and Affect: Mood normal.        Behavior: Behavior normal.      No results found for any visits on 07/05/23.   Last CBC Lab Results  Component Value Date   WBC 5.8 05/14/2023   HGB 11.9 (L) 05/14/2023   HCT 39.3 05/14/2023    MCV 80.2 05/14/2023   MCH 24.3 (L) 05/14/2023   RDW 21.2 (H) 05/14/2023   PLT 285 05/14/2023   Last metabolic panel Lab Results  Component Value Date   GLUCOSE 98 05/14/2023   NA 138 05/14/2023   K 3.6 05/14/2023   CL 105 05/14/2023   CO2 25 05/14/2023   BUN 14 05/14/2023   CREATININE 0.87 05/14/2023   EGFR 63 04/26/2023   CALCIUM 8.5 (L) 05/14/2023   PHOS 3.5 02/26/2018   PROT 6.8 05/14/2023   ALBUMIN 3.7 05/14/2023   BILITOT 0.7 05/14/2023   ALKPHOS 86 05/14/2023   AST 19 05/14/2023   ALT 15 05/14/2023   ANIONGAP 8 05/14/2023   Last lipids Lab Results  Component Value Date   CHOL 141 01/03/2023   HDL 61 01/03/2023   LDLCALC 66 01/03/2023   TRIG 64 01/03/2023   CHOLHDL 2.3 01/03/2023   Last hemoglobin A1c Lab Results  Component Value Date   HGBA1C 6.0 (H) 01/03/2023   Last thyroid functions Lab Results  Component Value Date   TSH 1.73 04/26/2023   Last vitamin D Lab Results  Component Value Date   VD25OH 49 04/26/2023   Last vitamin B12 and Folate Lab Results  Component Value Date   VITAMINB12 412 04/26/2023      The 10-year ASCVD risk score (Arnett DK, et al., 2019) is: 16.1%    Assessment & Plan:   1. Benign essential HTN: Stable, no changes to medications made.  Appropriate medications refilled.  - amLODipine (NORVASC) 5 MG tablet; Take 1 tablet (5 mg total) by mouth daily.  Dispense: 90 tablet; Refill: 1 - losartan (COZAAR) 100 MG tablet; Take 1 tablet (100 mg total) by mouth daily.  Dispense: 90 tablet; Refill: 1  2. Hyperlipidemia, mixed: Stable, continue Lipitor 10 mg every other day.  - atorvastatin (LIPITOR) 10 MG tablet; Take 1 tablet (10 mg total) by mouth every other day.  Dispense: 45 tablet; Refill: 1  3. Mild persistent asthma without complication: Stable, needing refills of albuterol inhaler.  - albuterol (VENTOLIN HFA) 108 (90 Base) MCG/ACT inhaler; Inhale 2 puffs into the lungs every 6 (six) hours as needed for wheezing or  shortness of breath.  Dispense: 1 each; Refill: 2  4. Gastroesophageal reflux disease without esophagitis: Following with GI, waiting for results of pill endoscopy.  Will refill Protonix 40 mg twice daily.  - pantoprazole (PROTONIX) 40 MG tablet; Take 1 tablet (40 mg total) by mouth 2 (two) times daily.  Dispense: 180 tablet; Refill: 1  5. Insomnia, unspecified type: Symptoms well-controlled, refilled trazodone 100 mg.  - traZODone (DESYREL) 100 MG tablet; Take 1 tablet (100 mg total) by mouth at bedtime.  Dispense: 90 tablet; Refill: 1  6. Iron deficiency anemia, unspecified iron deficiency anemia type: Still having fatigue, however patient has only had 2 iron transfusions so far, discussed that it can take time.  No follow-ups on file.    Margarita Mail, DO

## 2023-07-05 ENCOUNTER — Encounter: Payer: Self-pay | Admitting: Internal Medicine

## 2023-07-05 ENCOUNTER — Ambulatory Visit (INDEPENDENT_AMBULATORY_CARE_PROVIDER_SITE_OTHER): Payer: Medicare HMO | Admitting: Internal Medicine

## 2023-07-05 VITALS — BP 134/82 | HR 78 | Temp 97.8°F | Resp 18 | Ht 65.0 in | Wt 160.5 lb

## 2023-07-05 DIAGNOSIS — E782 Mixed hyperlipidemia: Secondary | ICD-10-CM | POA: Diagnosis not present

## 2023-07-05 DIAGNOSIS — R7303 Prediabetes: Secondary | ICD-10-CM | POA: Diagnosis not present

## 2023-07-05 DIAGNOSIS — G4700 Insomnia, unspecified: Secondary | ICD-10-CM

## 2023-07-05 DIAGNOSIS — I480 Paroxysmal atrial fibrillation: Secondary | ICD-10-CM | POA: Diagnosis not present

## 2023-07-05 DIAGNOSIS — I10 Essential (primary) hypertension: Secondary | ICD-10-CM

## 2023-07-05 DIAGNOSIS — D509 Iron deficiency anemia, unspecified: Secondary | ICD-10-CM

## 2023-07-05 DIAGNOSIS — Z23 Encounter for immunization: Secondary | ICD-10-CM

## 2023-07-05 DIAGNOSIS — J4530 Mild persistent asthma, uncomplicated: Secondary | ICD-10-CM

## 2023-07-05 DIAGNOSIS — K219 Gastro-esophageal reflux disease without esophagitis: Secondary | ICD-10-CM

## 2023-07-05 DIAGNOSIS — E039 Hypothyroidism, unspecified: Secondary | ICD-10-CM

## 2023-07-05 LAB — POCT GLYCOSYLATED HEMOGLOBIN (HGB A1C): Hemoglobin A1C: 5.4 % (ref 4.0–5.6)

## 2023-07-05 MED ORDER — OMEPRAZOLE 40 MG PO CPDR
40.0000 mg | DELAYED_RELEASE_CAPSULE | Freq: Two times a day (BID) | ORAL | 1 refills | Status: DC
Start: 2023-07-05 — End: 2023-12-28

## 2023-07-05 NOTE — Patient Instructions (Addendum)
It was great seeing you today!  Plan discussed at today's visit: -Recommend a nasal saline and a nasal steroid (either Flonase or Nasocort) to use 2 sprays on each side twice a day to help with upper respiratory symptoms -Switch Pantoprazole to Omeprazole for acid reflux -Prenvar 20 (pneumonia vaccine) given today   Follow up in: 6 months or sooner as needed  Take care and let us know if you have any questions or concerns prior to your next visit.  Dr. Caralee Ates

## 2023-07-24 ENCOUNTER — Telehealth: Payer: Self-pay | Admitting: Internal Medicine

## 2023-07-24 NOTE — Telephone Encounter (Signed)
Referral Request - Did the patient discuss referral with their provider in the last year? Yes (If No - schedule appointment) (If Yes - send message)  Appointment offered? No  Type of order/referral and detailed reason for visit: pt still coughing and throwing up.  Preference of office, provider, location: [facility]  [phone] Pt cannot be seen by this md w/out a referal If referral order, have you been seen by this specialty before? No (If Yes, this issue or another issue? When? Where?  Can we respond through MyChart? no

## 2023-07-26 NOTE — Telephone Encounter (Signed)
Patient has called in regards to this referral. Patient states she contacted Pulmonology in regards to this referral and that the lady at the office told her they can not see if the referral was sent over or not because the drawer was locked until Monday 07/31/2023 and to please send another referral over if one was sent. Advised patient it does not look like a referral had been sent yet. Patient states she can not keep going on like this and needs this referral quick.  Please advise and follow back up with the patient at phone # [phone]

## 2023-07-27 ENCOUNTER — Other Ambulatory Visit: Payer: Self-pay | Admitting: Internal Medicine

## 2023-07-27 DIAGNOSIS — R053 Chronic cough: Secondary | ICD-10-CM

## 2023-07-27 DIAGNOSIS — E039 Hypothyroidism, unspecified: Secondary | ICD-10-CM | POA: Diagnosis not present

## 2023-07-27 DIAGNOSIS — J4530 Mild persistent asthma, uncomplicated: Secondary | ICD-10-CM

## 2023-07-28 ENCOUNTER — Ambulatory Visit: Payer: Self-pay

## 2023-07-28 NOTE — Telephone Encounter (Signed)
  Chief Complaint: medication assistance  Symptoms: chronic cough  Frequency: ongoing a while Pertinent Negatives:NA Disposition: [] ED /[] Urgent Care (no appt availability in office) / [] Appointment(In office/virtual)/ []  [facility]/ [] Home Care/ [] Refused Recommended Disposition /[] [facility] [place] [facility]/ [x]  Follow-up with PCP Additional Notes: pt requesting something to help with cough. She states been going on for several weeks. Pt currently takes montelukast but not helping with cough. Advised pt I would send message to provider. Pt does have pulmonology referral but doesn't see them anytime soon. Pt would like PCP to send in something to help.   Summary: Pt requesting rx - continuous cough   Pt requesting a cough medicine to be called in. Pt has been coughing non stop for 2 weeks. Pt saw Dr. Caralee Ates 07-05-2023 for cough.  Please advise         Reason for Disposition  [1] Caller has URGENT medicine question about med that PCP or specialist prescribed AND [2] triager unable to answer question  Answer Assessment - Initial Assessment Questions 1. NAME of MEDICINE: "What medicine(s) are you calling about?"     Cough rx 2. QUESTION: "What is your question?" (e.g., double dose of medicine, side effect)     Wanting to send in rx  3. PRESCRIBER: "Who prescribed the medicine?" Reason: if prescribed by specialist, call should be referred to that group.     Dr. Caralee Ates  4. SYMPTOMS: "Do you have any symptoms?" If Yes, ask: "What symptoms are you having?"  "How bad are the symptoms (e.g., mild, moderate, severe)     Chronic cough  Protocols used: Medication Question Call-A-AH

## 2023-07-31 ENCOUNTER — Inpatient Hospital Stay: Payer: Medicare HMO | Attending: Oncology

## 2023-07-31 ENCOUNTER — Ambulatory Visit (INDEPENDENT_AMBULATORY_CARE_PROVIDER_SITE_OTHER): Payer: Medicare HMO | Admitting: Physician Assistant

## 2023-07-31 DIAGNOSIS — D509 Iron deficiency anemia, unspecified: Secondary | ICD-10-CM | POA: Insufficient documentation

## 2023-07-31 DIAGNOSIS — Z91199 Patient's noncompliance with other medical treatment and regimen due to unspecified reason: Secondary | ICD-10-CM

## 2023-07-31 LAB — CBC WITH DIFFERENTIAL (CANCER CENTER ONLY)
Abs Immature Granulocytes: 0.06 10*3/uL (ref 0.00–0.07)
Basophils Absolute: 0 10*3/uL (ref 0.0–0.1)
Basophils Relative: 1 %
Eosinophils Absolute: 0.1 10*3/uL (ref 0.0–0.5)
Eosinophils Relative: 1 %
HCT: 40.8 % (ref 36.0–46.0)
Hemoglobin: 13 g/dL (ref 12.0–15.0)
Immature Granulocytes: 1 %
Lymphocytes Relative: 33 %
Lymphs Abs: 1.6 10*3/uL (ref 0.7–4.0)
MCH: 28.1 pg (ref 26.0–34.0)
MCHC: 31.9 g/dL (ref 30.0–36.0)
MCV: 88.1 fL (ref 80.0–100.0)
Monocytes Absolute: 0.3 10*3/uL (ref 0.1–1.0)
Monocytes Relative: 6 %
Neutro Abs: 2.9 10*3/uL (ref 1.7–7.7)
Neutrophils Relative %: 58 %
Platelet Count: 230 10*3/uL (ref 150–400)
RBC: 4.63 MIL/uL (ref 3.87–5.11)
RDW: 18 % — ABNORMAL HIGH (ref 11.5–15.5)
WBC Count: 5 10*3/uL (ref 4.0–10.5)
nRBC: 0 % (ref 0.0–0.2)

## 2023-07-31 LAB — IRON AND TIBC
Iron: 53 ug/dL (ref 28–170)
Saturation Ratios: 14 % (ref 10.4–31.8)
TIBC: 367 ug/dL (ref 250–450)
UIBC: 314 ug/dL

## 2023-07-31 LAB — RETIC PANEL
Immature Retic Fract: 7.1 % (ref 2.3–15.9)
RBC.: 4.61 MIL/uL (ref 3.87–5.11)
Retic Count, Absolute: 44.7 10*3/uL (ref 19.0–186.0)
Retic Ct Pct: 1 % (ref 0.4–3.1)
Reticulocyte Hemoglobin: 32 pg (ref >27.9)

## 2023-07-31 LAB — FERRITIN: Ferritin: 59 ng/mL (ref 11–307)

## 2023-07-31 NOTE — Telephone Encounter (Signed)
Pt has an appt today with Denny Peon

## 2023-08-01 ENCOUNTER — Encounter: Payer: Self-pay | Admitting: Oncology

## 2023-08-01 ENCOUNTER — Inpatient Hospital Stay: Payer: Medicare HMO

## 2023-08-01 ENCOUNTER — Inpatient Hospital Stay (HOSPITAL_BASED_OUTPATIENT_CLINIC_OR_DEPARTMENT_OTHER): Payer: Medicare HMO | Admitting: Oncology

## 2023-08-01 VITALS — BP 166/87 | HR 78 | Temp 97.8°F | Resp 18 | Wt 158.7 lb

## 2023-08-01 DIAGNOSIS — R011 Cardiac murmur, unspecified: Secondary | ICD-10-CM | POA: Diagnosis not present

## 2023-08-01 DIAGNOSIS — J411 Mucopurulent chronic bronchitis: Secondary | ICD-10-CM | POA: Diagnosis not present

## 2023-08-01 DIAGNOSIS — D509 Iron deficiency anemia, unspecified: Secondary | ICD-10-CM | POA: Diagnosis not present

## 2023-08-01 NOTE — Progress Notes (Signed)
Hematology/Oncology Consult note [phone] [phone]      Patient Care Team: Margarita Mail, DO as PCP - General (Internal Medicine) Rickard Patience, MD as Consulting Physician (Oncology)   REFERRING PROVIDER: Margarita Mail, DO  CHIEF COMPLAINTS/REASON FOR VISIT:  Iron deficiency anemia  ASSESSMENT & PLAN:  Iron deficiency anemia Labs are reviewed and discussed with patient. Lab Results  Component Value Date   HGB 13.0 07/31/2023   TIBC 367 07/31/2023   IRONPCTSAT 14 07/31/2023   FERRITIN 59 07/31/2023     No need for IV Venofer.  Recommend patient to take oral iron supplementation every other day for maintenance.    Orders Placed This Encounter  Procedures   CBC with Differential (Cancer Center Only)    Standing Status:   Future    Standing Expiration Date:   07/31/2024   Iron and TIBC    Standing Status:   Future    Standing Expiration Date:   07/31/2024   Ferritin    Standing Status:   Future    Standing Expiration Date:   07/31/2024   Retic Panel    Standing Status:   Future    Standing Expiration Date:   07/31/2024   Follow up in 6 months. All questions were answered. The patient knows to call the clinic with any problems, questions or concerns.  Rickard Patience, MD, PhD [facility] Hematology Oncology 08/01/2023     HISTORY OF PRESENTING ILLNESS:  Sonya Harper is a  77 y.o.  female with PMH listed below who was referred to me for anemia Reviewed patient's recent labs that was done.  She was found to have abnormal CBC on 04/26/2023, Hb 10.5, mcv 77.8, ferritin 5, iron saturation 5, TIBC 434, B12 412 Reviewed patient's previous labs ordered by primary care physician's office, anemia is since April 2024,  No aggravating or improving factors. + fatigue.  She denies recent chest pain on exertion, shortness of breath on minimal exertion, pre-syncopal episodes, or palpitations She had not noticed any recent bleeding such as  epistaxis, hematuria or hematochezia.  She is 5mg  BID for Afib.  Her last EGD and colonoscopy were on 04/06/2023, Tubular adenoma and hyperplastic polyps. She just had capsule endoscopy done, result is negative.   Sonya Harper is a 77 y.o. female who has above history reviewed by me today presents for follow up visit for iron deficiency anemia.  She tolerated IV Venofer treatments.  No new complains.     MEDICAL HISTORY:  Past Medical History:  Diagnosis Date   A-fib [facility])    Allergy    Arthritis    feet   Asthma    Breast neoplasm    Chronic lower back pain    s/p fall injury   Depression    Dysrhythmia    Fibromyalgia    Hypertension    Hypothyroidism    Lumbar facet arthropathy 11/06/2018   Prediabetes 02/27/2018   Sciatica of right side    Wears dentures    partial upper and lower    SURGICAL HISTORY: Past Surgical History:  Procedure Laterality Date   ABDOMINAL HYSTERECTOMY     BIOPSY  04/06/2023   Procedure: BIOPSY;  Surgeon: Midge Minium, MD;  Location: [facility] ENDOSCOPY;  Service: Endoscopy;;   CATARACT EXTRACTION W/PHACO Right 02/02/2022   Procedure: CATARACT EXTRACTION PHACO AND INTRAOCULAR LENS PLACEMENT (IOC) RIGHT;  Surgeon: Lockie Mola, MD;  Location: [facility];  Service: Ophthalmology;  Laterality: Right;  4.52 00:45.9  CATARACT EXTRACTION W/PHACO Left 02/16/2022   Procedure: CATARACT EXTRACTION PHACO AND INTRAOCULAR LENS PLACEMENT (IOC) LEFT 7.05 01:19.8;  Surgeon: Lockie Mola, MD;  Location: [facility];  Service: Ophthalmology;  Laterality: Left;   COLONOSCOPY WITH PROPOFOL N/A 05/13/2016   Procedure: COLONOSCOPY WITH PROPOFOL;  Surgeon: Midge Minium, MD;  Location: [facility];  Service: Endoscopy;  Laterality: N/A;   COLONOSCOPY WITH PROPOFOL N/A 09/20/2019   Procedure: COLONOSCOPY WITH PROPOFOL;  Surgeon: Midge Minium, MD;  Location: [facility] ENDOSCOPY;  Service: Endoscopy;  Laterality: N/A;    COLONOSCOPY WITH PROPOFOL N/A 04/06/2023   Procedure: COLONOSCOPY WITH PROPOFOL;  Surgeon: Midge Minium, MD;  Location: [facility] END[facility]COPY;  Service: Endoscopy;  Laterality: N/A;   ELECTROPHYSIOLOGIC STUDY     Ablation   ESOPHAGOGASTRODUODENOSCOPY (EGD) WITH PROPOFOL N/A 09/20/2019   Procedure: ESOPHAGOGASTRODUODENOSCOPY (EGD) WITH PROPOFOL;  Surgeon: Midge Minium, MD;  Location: [facility] ENDOSCOPY;  Service: Endoscopy;  Laterality: N/A;   ESOPHAGOGASTRODUODENOSCOPY (EGD) WITH PROPOFOL N/A 04/06/2023   Procedure: ESOPHAGOGASTRODUODENOSCOPY (EGD) WITH PROPOFOL;  Surgeon: Midge Minium, MD;  Location: [facility] ENDOSCOPY;  Service: Endoscopy;  Laterality: N/A;   GIVENS CAPSULE STUDY N/A 05/02/2023   Procedure: GIVENS CAPSULE STUDY;  Surgeon: Midge Minium, MD;  Location: [facility] ENDOSCOPY;  Service: Endoscopy;  Laterality: N/A;   POLYPECTOMY N/A 05/13/2016   Procedure: POLYPECTOMY;  Surgeon: Midge Minium, MD;  Location: [facility];  Service: Endoscopy;  Laterality: N/A;   POLYPECTOMY  04/06/2023   Procedure: POLYPECTOMY;  Surgeon: Midge Minium, MD;  Location: [facility] ENDOSCOPY;  Service: Endoscopy;;    SOCIAL HISTORY: Social History   Socioeconomic History   Marital status: Married    Spouse name: Not on file   Number of children: Not on file   Years of education: Not on file   Highest education level: Not on file  Occupational History   Not on file  Tobacco Use   Smoking status: Never    Passive exposure: Never   Smokeless tobacco: Never  Vaping Use   Vaping status: Never Used  Substance and Sexual Activity   Alcohol use: No    Alcohol/week: 0.0 standard drinks of alcohol   Drug use: Never   Sexual activity: Not Currently    Birth control/protection: Post-menopausal  Other Topics Concern   Not on file  Social History Narrative   Not on file   Social Determinants of Health   Financial Resource Strain: Not on file  Food Insecurity: No Food Insecurity (05/05/2023)   Hunger Vital Sign     Worried About Running Out of Food in the Last Year: Never true    Ran Out of Food in the Last Year: Never true  Transportation Needs: No Transportation Needs (05/05/2023)   PRAPARE - Administrator, Civil Service (Medical): No    Lack of Transportation (Non-Medical): No  Physical Activity: Not on file  Stress: Not on file  Social Connections: Not on file  Intimate Partner Violence: Not At Risk (05/05/2023)   Humiliation, Afraid, Rape, and Kick questionnaire    Fear of Current or Ex-Partner: No    Emotionally Abused: No    Physically Abused: No    Sexually Abused: No    FAMILY HISTORY: Family History  Problem Relation Age of Onset   Stroke Mother    Breast cancer Sister 52       breast ca x3   Colon cancer Paternal Aunt    Cancer Sister     ALLERGIES:  is allergic to molds &  smuts and penicillins.  MEDICATIONS:  Current Outpatient Medications  Medication Sig Dispense Refill   albuterol (VENTOLIN HFA) 108 (90 Base) MCG/ACT inhaler Inhale 2 puffs into the lungs every 6 (six) hours as needed for wheezing or shortness of breath. 1 each 2   amLODipine (NORVASC) 5 MG tablet Take 1 tablet (5 mg total) by mouth daily. 90 tablet 1   apixaban (ELIQUIS) 5 MG TABS tablet Take 1 tablet (5 mg total) by mouth 2 (two) times daily. 180 tablet 3   atorvastatin (LIPITOR) 10 MG tablet Take 1 tablet (10 mg total) by mouth every other day. 45 tablet 1   Blood Pressure Monitoring (BLOOD PRESSURE MONITOR/L CUFF) MISC 1 each by Does not apply route daily. 1 each 0   chlorpheniramine-HYDROcodone (TUSSIONEX) 10-8 MG/5ML Take 2.5 mLs by mouth every 12 (twelve) hours as needed for cough. 25 mL 0   Cholecalciferol (VITAMIN D-1000 MAX ST) 25 MCG (1000 UT) tablet Take by mouth.     DIPHENHYDRAMINE HCL, TOPICAL, (BENADRYL ITCH STOPPING) 2 % GEL Apply 1 application topically in the morning, at noon, and at bedtime. 118 mL 1   fluticasone (FLONASE) 50 MCG/ACT nasal spray Place 2 sprays into both  nostrils daily. 16 g 6   fluticasone furoate-vilanterol (BREO ELLIPTA) 200-25 MCG/ACT AEPB Inhale 1 puff into the lungs daily. 1 each 11   loratadine (CLARITIN) 10 MG tablet Take 1 tablet (10 mg total) by mouth daily. 30 tablet 11   losartan (COZAAR) 100 MG tablet Take 1 tablet (100 mg total) by mouth daily. 90 tablet 1   mometasone (ELOCON) 0.1 % cream Apply once daily up to 5 days a week for eczema flares 15 g 1   montelukast (SINGULAIR) 10 MG tablet TAKE 1 TABLET BY MOUTH AT BEDTIME 90 tablet 3   mupirocin ointment (BACTROBAN) 2 % Apply 1 Application topically daily. Qd to excision site 22 g 1   omeprazole (PRILOSEC) 40 MG capsule Take 1 capsule (40 mg total) by mouth 2 (two) times daily. 180 capsule 1   ondansetron (ZOFRAN-ODT) 4 MG disintegrating tablet Take 1 tablet (4 mg total) by mouth every 6 (six) hours as needed for nausea or vomiting. 20 tablet 0   SYNTHROID 50 MCG tablet TAKE 1 BY MOUTH DAILY. DO NOT SUBSTITUTE  0   traZODone (DESYREL) 100 MG tablet Take 1 tablet (100 mg total) by mouth at bedtime. 90 tablet 1   Vibegron (GEMTESA) 75 MG TABS Take 1 tablet (75 mg total) by mouth daily. 30 tablet 11   EPINEPHrine 0.3 mg/0.3 mL IJ SOAJ injection INJECT INTO THE MIDDLE OF THE OUTER THIGH AND HOLD FOR 10 SECONDS AS NEEDED FOR SEVERE ALLERGIC REACTION THEN CALL 911 IF USED (Patient not taking: Reported on 08/01/2023) 2 each 1   Iron, Ferrous Sulfate, 325 (65 Fe) MG TABS Take 325 mg by mouth daily. (Patient not taking: Reported on 08/01/2023) 90 tablet 1   nitroGLYCERIN (NITROSTAT) 0.4 MG SL tablet Place 1 tablet (0.4 mg total) under the tongue every 5 (five) minutes as needed for chest pain. Max 3 pills per episode (Patient not taking: Reported on 08/01/2023) 50 tablet 3   No current facility-administered medications for this visit.    Review of Systems  Constitutional:  Positive for fatigue. Negative for appetite change, chills and fever.  HENT:   Negative for hearing loss and voice  change.   Eyes:  Negative for eye problems.  Respiratory:  Negative for chest tightness and cough.  Cardiovascular:  Negative for chest pain.  Gastrointestinal:  Negative for abdominal distention, abdominal pain and blood in stool.  Endocrine: Negative for hot flashes.  Genitourinary:  Negative for difficulty urinating and frequency.   Musculoskeletal:  Negative for arthralgias.  Skin:  Negative for itching and rash.  Neurological:  Negative for extremity weakness.  Hematological:  Negative for adenopathy.  Psychiatric/Behavioral:  Negative for confusion.     PHYSICAL EXAMINATION: Vitals:   08/01/23 1021 08/01/23 1024  BP: (!) 174/88 (!) 166/87  Pulse: 78   Resp: 18   Temp: 97.8 F (36.6 C)   SpO2: 96%    Filed Weights   08/01/23 1021  Weight: 158 lb 11.2 oz (72 kg)    Physical Exam Constitutional:      General: She is not in acute distress. HENT:     Head: Normocephalic and atraumatic.  Eyes:     General: No scleral icterus. Cardiovascular:     Rate and Rhythm: Normal rate. Rhythm irregular.     Heart sounds: Murmur heard.  Pulmonary:     Effort: Pulmonary effort is normal. No respiratory distress.  Abdominal:     General: Bowel sounds are normal. There is no distension.     Palpations: Abdomen is soft.  Musculoskeletal:        General: No deformity. Normal range of motion.     Cervical back: Normal range of motion and neck supple.  Skin:    General: Skin is warm and dry.     Findings: No erythema or rash.  Neurological:     Mental Status: She is alert and oriented to person, place, and time. Mental status is at baseline.     Cranial Nerves: No cranial nerve deficit.     Coordination: Coordination normal.  Psychiatric:        Mood and Affect: Mood normal.      LABORATORY DATA:  I have reviewed the data as listed    Latest Ref Rng & Units 07/31/2023   10:11 AM 05/14/2023   10:42 AM 05/11/2023   11:17 AM  CBC  WBC 4.0 - 10.5 K/uL 5.0  5.8  6.4    Hemoglobin 12.0 - 15.0 g/dL 16.1  09.6  04.5   Hematocrit 36.0 - 46.0 % 40.8  39.3  35.1   Platelets 150 - 400 K/uL 230  285  253       Latest Ref Rng & Units 05/14/2023   10:42 AM 04/26/2023    8:26 AM 01/10/2023   10:13 AM  CMP  Glucose 70 - 99 mg/dL 98  82  95   BUN 8 - 23 mg/dL 14  16  18    Creatinine 0.44 - 1.00 mg/dL 4.09  8.11  9.14   Sodium 135 - 145 mmol/L 138  140  142   Potassium 3.5 - 5.1 mmol/L 3.6  4.1  4.4   Chloride 98 - 111 mmol/L 105  105  107   CO2 22 - 32 mmol/L 25  28  26    Calcium 8.9 - 10.3 mg/dL 8.5  8.8  8.9   Total Protein 6.5 - 8.1 g/dL 6.8  6.4  6.1   Total Bilirubin 0.3 - 1.2 mg/dL 0.7  0.3  0.5   Alkaline Phos 38 - 126 U/L 86     AST 15 - 41 U/L 19  14  13    ALT 0 - 44 U/L 15  9  8     Lab Results  Component Value  Date   IRON 53 07/31/2023   TIBC 367 07/31/2023   IRONPCTSAT 14 07/31/2023   FERRITIN 59 07/31/2023     RADIOGRAPHIC STUDIES: I have personally reviewed the radiological images as listed and agreed with the findings in the report. No results found.

## 2023-08-01 NOTE — Assessment & Plan Note (Addendum)
Labs are reviewed and discussed with patient. Lab Results  Component Value Date   HGB 13.0 07/31/2023   TIBC 367 07/31/2023   IRONPCTSAT 14 07/31/2023   FERRITIN 59 07/31/2023     No need for IV Venofer.  Recommend patient to take oral iron supplementation every other day for maintenance.

## 2023-08-01 NOTE — Assessment & Plan Note (Signed)
Recommend patient to discuss with PCP and cardiology for further evaluation.

## 2023-08-02 LAB — CELIAC PANEL 10
Antigliadin Abs, IgA: 6 U (ref 0–19)
Endomysial Ab, IgA: NEGATIVE
Gliadin IgG: 2 U (ref 0–19)
IgA: 323 mg/dL (ref 64–422)
Tissue Transglut Ab: <2 U/mL (ref 0–5)
Tissue Transglutaminase Ab, IgA: <2 U/mL (ref 0–3)

## 2023-08-02 NOTE — Progress Notes (Signed)
No show for apt.

## 2023-08-07 ENCOUNTER — Ambulatory Visit (INDEPENDENT_AMBULATORY_CARE_PROVIDER_SITE_OTHER): Payer: Medicare HMO | Admitting: Internal Medicine

## 2023-08-07 ENCOUNTER — Other Ambulatory Visit: Payer: Self-pay

## 2023-08-07 ENCOUNTER — Encounter: Payer: Self-pay | Admitting: Internal Medicine

## 2023-08-07 VITALS — BP 132/84 | HR 84 | Temp 98.0°F | Resp 18 | Ht 65.0 in | Wt 161.9 lb

## 2023-08-07 DIAGNOSIS — S0990XA Unspecified injury of head, initial encounter: Secondary | ICD-10-CM

## 2023-08-07 DIAGNOSIS — R053 Chronic cough: Secondary | ICD-10-CM | POA: Diagnosis not present

## 2023-08-07 MED ORDER — HYDROCOD POLI-CHLORPHE POLI ER 10-8 MG/5ML PO SUER: 2.5000 mL | Freq: Two times a day (BID) | ORAL | 0 refills | Status: AC | PRN

## 2023-08-07 NOTE — Progress Notes (Signed)
Acute Office Visit  Subjective:     Patient ID: Sonya Harper, female    DOB: July 25, 1946, 77 y.o.   MRN: [id number]  Chief Complaint  Patient presents with   Head Injury    Hit head when coming up from floor   Cough    For 1 month    Head Injury  Associated symptoms include headaches. Pertinent negatives include no blurred vision or weakness.  Cough Associated symptoms include headaches, shortness of breath and wheezing. Pertinent negatives include no chest pain, chills, ear pain, fever or sore throat.   Patient is in today for head injury. Hit head on the right temple area one week ago. Patient was bending over to pick something up and head her head on a cupboard. Does have a slight headache. No changes in vision but has pain behind right eye. No dizziness, LOC but does feel like balance is off. Is currently on Eliquis for A fib.   Chronic cough:  -Going on for a month. Non-productive.  -Feeling short of breath with exertion. Wheezing -On Prednisone, Zithromax. Breo inhaler, Albuterol currently -Following with Pulmonology    Review of Systems  Constitutional:  Negative for chills and fever.  HENT:  Negative for congestion, ear pain, sinus pain and sore throat.   Eyes:  Negative for blurred vision, double vision and discharge.  Respiratory:  Positive for cough, shortness of breath and wheezing.   Cardiovascular:  Negative for chest pain.  Neurological:  Positive for headaches. Negative for dizziness, sensory change, loss of consciousness and weakness.        Objective:    BP 132/84   Pulse 84   Temp 98 F (36.7 C) (Oral)   Resp 18   Ht 5\' 5"  (1.651 m)   Wt 161 lb 14.4 oz (73.4 kg)   SpO2 98%   BMI 26.94 kg/m  BP Readings from Last 3 Encounters:  08/07/23 132/84  08/01/23 (!) 166/87  07/05/23 134/82   Wt Readings from Last 3 Encounters:  08/07/23 161 lb 14.4 oz (73.4 kg)  08/01/23 158 lb 11.2 oz (72 kg)  07/05/23 160 lb 8 oz (72.8 kg)      Physical  Exam Constitutional:      Appearance: Normal appearance.  HENT:     Head: Normocephalic and atraumatic.     Nose: Nose normal.     Mouth/Throat:     Mouth: Mucous membranes are moist.     Pharynx: Oropharynx is clear.  Eyes:     Extraocular Movements: Extraocular movements intact.     Conjunctiva/sclera: Conjunctivae normal.     Pupils: Pupils are equal, round, and reactive to light.  Cardiovascular:     Rate and Rhythm: Normal rate. Rhythm irregular.  Pulmonary:     Effort: Pulmonary effort is normal.     Breath sounds: Normal breath sounds.  Skin:    General: Skin is warm and dry.  Neurological:     General: No focal deficit present.     Mental Status: She is alert. Mental status is at baseline.     Cranial Nerves: No cranial nerve deficit.     Sensory: No sensory deficit.     Motor: No weakness.     Coordination: Coordination normal.     Gait: Gait normal.  Psychiatric:        Mood and Affect: Mood normal.        Behavior: Behavior normal.     No results found for any visits  on 08/07/23.      Assessment & Plan:   1. Traumatic injury of head, initial encounter: Occurred 1 week ago, suspect mild concussion based on symptoms and exam today. Will order CT of the head as she is on blood thinners.   - CT HEAD WO CONTRAST ( ); Future  2. Chronic cough: Ongoing despite being on inhalers, steroids and antibiotic. Following with Pulmonology. Will prescribe cough suppressant today but recommend she be evaluated by allergist.   - chlorpheniramine-HYDROcodone (TUSSIONEX) 10-8 MG/5ML; Take 2.5 mLs by mouth every 12 (twelve) hours as needed for up to 5 days for cough.  Dispense: 25 mL; Refill: 0   Return for already scheduled .  Margarita Mail, DO

## 2023-08-17 DIAGNOSIS — S0511XA Contusion of eyeball and orbital tissues, right eye, initial encounter: Secondary | ICD-10-CM | POA: Diagnosis not present

## 2023-08-17 DIAGNOSIS — H43813 Vitreous degeneration, bilateral: Secondary | ICD-10-CM | POA: Diagnosis not present

## 2023-08-17 DIAGNOSIS — Z961 Presence of intraocular lens: Secondary | ICD-10-CM | POA: Diagnosis not present

## 2023-08-17 DIAGNOSIS — H40003 Preglaucoma, unspecified, bilateral: Secondary | ICD-10-CM | POA: Diagnosis not present

## 2023-08-21 ENCOUNTER — Encounter: Payer: Self-pay | Admitting: Oncology

## 2023-08-22 ENCOUNTER — Other Ambulatory Visit: Payer: Self-pay

## 2023-08-22 ENCOUNTER — Ambulatory Visit
Admission: RE | Admit: 2023-08-22 | Discharge: 2023-08-22 | Disposition: A | Discharge status: Home / Self Care | Payer: Medicare HMO | Source: Ambulatory Visit | Attending: Internal Medicine | Admitting: Internal Medicine

## 2023-08-22 ENCOUNTER — Ambulatory Visit (INDEPENDENT_AMBULATORY_CARE_PROVIDER_SITE_OTHER): Payer: Medicare HMO | Admitting: Internal Medicine

## 2023-08-22 ENCOUNTER — Encounter: Payer: Self-pay | Admitting: Internal Medicine

## 2023-08-22 VITALS — BP 128/84 | HR 98 | Temp 97.9°F | Resp 16 | Ht 65.0 in | Wt 159.2 lb

## 2023-08-22 DIAGNOSIS — I480 Paroxysmal atrial fibrillation: Secondary | ICD-10-CM | POA: Diagnosis not present

## 2023-08-22 DIAGNOSIS — I10 Essential (primary) hypertension: Secondary | ICD-10-CM

## 2023-08-22 DIAGNOSIS — S0990XA Unspecified injury of head, initial encounter: Secondary | ICD-10-CM | POA: Diagnosis not present

## 2023-08-22 NOTE — Progress Notes (Signed)
Established Patient Office Visit  Subjective   Patient ID: Sonya Harper, female    DOB: 04/08/1946  Age: 77 y.o. MRN: [id number]  Chief Complaint  Patient presents with   Referral    For Echo per Dr.Yu    HPI  Patient is here for follow up on chronic medical conditions. Patient states that the last time she saw Dr. Cathie Hoops she had mentioned trying to get an echo for the patient due to murmur ausculted on exam and the patient's on going shortness of breath. Patient does have chronic a. Fib and has a Development worker, international aid in [place] but she only sees him once a year and is scheduled in February. No chest pain, palpitations, syncope, etc. Still waiting for CT of head to be scheduled from last visit.   Hypertension/A.fib: -Medications: Amlodipine 5 mg, Losartan 50 mg, Eliquis 5 mg BID -Patient is compliant with above medications and reports no side effects. -Checking BP at home (average): Not checking  -Denies any acute vision changes, LE edema or symptoms of hypotension -Follows with Cardiology, note from 10/17/22  reviewed. Had been on Sotalol for rhythm control in the past, however this was discontinued as she did not have reoccurrence of symptoms.  Nuclear stress test in 2018 was unremarkable.  TEE from 3/22 unremarkable as well with a normal EF.  HLD: -Medications: Lipitor 10 mg every other day  -Patient is compliant with above medications and reports no side effects.  -Last lipid panel: Lipid Panel     Component Value Date/Time   CHOL 141 01/03/2023 1121   TRIG 64 01/03/2023 1121   HDL 61 01/03/2023 1121   CHOLHDL 2.3 01/03/2023 1121   LDLCALC 66 01/03/2023 1121    Hx of Pre-Diabetes: -Last A1c 6.0% 4/24, 5.4% 10/24 -Not currently on any medication  Asthma:  -Asthma status: controlled -Current Treatments: Breo-Ellipta 200 daily, albuterol PRN -Satisfied with current treatment?: yes -Albuterol/rescue inhaler frequency: Occasionally -Dyspnea frequency: Occasionally -Wheezing  frequency: Occasionally, not recently -Cough frequency: Daily, nonproductive -Limitation of activity: no -Current upper respiratory symptoms: no -Visits to ER or Urgent Care in past year: no -Pneumovax: Prevnar 23 in 2018, 13 in 2016 - will do Prevnar 20 today -Influenza: Up to Date  GERD: -Currently on Protonix 40 mg but feels like it is not working well for her -Had EGD 1/21, which did show gastritis, negative for dysplasia.    Hypothyroidism: -Medications: Levothyroxine 50 mcg  -Patient is compliant with the above medication (s) at the above dose and reports no medication side effects.  -Denies weight changes, cold./heat intolerance, skin changes, anxiety/palpitations  -Last TSH: 1.73, 8/24 -Follows with Endocrinology, note from 07/27/23 reviewed, seen on annual basis.   Insomnia:  -Currently on Trazodone 100 mg, doing well   Health Maintenance: -Blood work UTD -Mammogram 9/24 Birads-2 -Colonoscopy 09/20/19 - repeat in 5 years  Patient Active Problem List   Diagnosis Date Noted   Heart murmur 08/01/2023   Iron deficiency anemia 04/06/2023   Polyp of descending colon 04/06/2023   Status post radiofrequency ablation for arrhythmia 12/12/2022   Exposure to COVID-19 virus 09/23/2020   Hypothyroidism, adult 05/21/2020   Persistent asthma with acute exacerbation 04/24/2020   Polyp of transverse colon    Polyp of ascending colon    Chronic anticoagulation 09/12/2019   Paroxysmal atrial fibrillation (HCC) 09/12/2019   AKI (acute kidney injury) (HCC) 06/26/2019   Grade I diastolic dysfunction 06/26/2019   Mild mitral regurgitation 06/26/2019   Mild tricuspid regurgitation 06/26/2019  Lumbar facet arthropathy 11/06/2018   Prediabetes 02/27/2018   Medication monitoring encounter 11/13/2017   Abnormal laboratory test 10/16/2017   Palpitations 08/23/2017   Breast pain in female 08/10/2017   Obesity (BMI 30.0-34.9) 08/10/2017   Protein in urine 08/10/2017   Vaginal atrophy  08/10/2017   Benign essential HTN 11/16/2016   Hyperlipidemia, mixed 11/16/2016   SOBOE (shortness of breath on exertion) 11/16/2016   Dysrhythmia 07/07/2016   Right low back pain 07/07/2016   Fall on or from stairs or steps 07/07/2016   Family history of malignant neoplasm of gastrointestinal tract    Benign neoplasm of ascending colon    Benign neoplasm of transverse colon    Benign neoplasm of descending colon    Goiter 03/03/2015   Asthma, mild persistent 03/03/2015   Gastroesophageal reflux disease without esophagitis 03/03/2015   Past Medical History:  Diagnosis Date   A-fib [facility])    Allergy    Arthritis    feet   Asthma    Breast neoplasm    Chronic lower back pain    s/p fall injury   Depression    Dysrhythmia    Fibromyalgia    Hypertension    Hypothyroidism    Lumbar facet arthropathy 11/06/2018   Prediabetes 02/27/2018   Sciatica of right side    Wears dentures    partial upper and lower   Past Surgical History:  Procedure Laterality Date   ABDOMINAL HYSTERECTOMY     BIOPSY  04/06/2023   Procedure: BIOPSY;  Surgeon: Midge Minium, MD;  Location: [facility] ENDOSCOPY;  Service: Endoscopy;;   CATARACT EXTRACTION W/PHACO Right 02/02/2022   Procedure: CATARACT EXTRACTION PHACO AND INTRAOCULAR LENS PLACEMENT (IOC) RIGHT;  Surgeon: Lockie Mola, MD;  Location: [facility];  Service: Ophthalmology;  Laterality: Right;  4.52 00:45.9   CATARACT EXTRACTION W/PHACO Left 02/16/2022   Procedure: CATARACT EXTRACTION PHACO AND INTRAOCULAR LENS PLACEMENT (IOC) LEFT 7.05 01:19.8;  Surgeon: Lockie Mola, MD;  Location: [facility];  Service: Ophthalmology;  Laterality: Left;   COLONOSCOPY WITH PROPOFOL N/A 05/13/2016   Procedure: COLONOSCOPY WITH PROPOFOL;  Surgeon: Midge Minium, MD;  Location: [facility] SURGERY [facility]TR;  Service: Endoscopy;  Laterality: N/A;   COLONOSCOPY WITH PROPOFOL N/A 09/20/2019   Procedure: COLONOSCOPY WITH PROPOFOL;  Surgeon: Midge Minium,  MD;  Location: [facility] ENDOSCOPY;  Service: Endoscopy;  Laterality: N/A;   COLONOSCOPY WITH PROPOFOL N/A 04/06/2023   Procedure: COLONOSCOPY WITH PROPOFOL;  Surgeon: Midge Minium, MD;  Location: [facility] ENDOSCOPY;  Service: Endoscopy;  Laterality: N/A;   ELECTROPHYSIOLOGIC STUDY     Ablation   ESOPHAGOGASTRODUODENOSCOPY (EGD) WITH PROPOFOL N/A 09/20/2019   Procedure: ESOPHAGOGASTRODUODENOSCOPY (EGD) WITH PROPOFOL;  Surgeon: Midge Minium, MD;  Location: [facility] ENDOSCOPY;  Service: Endoscopy;  Laterality: N/A;   ESOPHAGOGASTRODUODENOSCOPY (EGD) WITH PROPOFOL N/A 04/06/2023   Procedure: ESOPHAGOGASTRODUODENOSCOPY (EGD) WITH PROPOFOL;  Surgeon: Midge Minium, MD;  Location: [facility] ENDOSCOPY;  Service: Endoscopy;  Laterality: N/A;   GIVENS CAPSULE STUDY N/A 05/02/2023   Procedure: GIVENS CAPSULE STUDY;  Surgeon: Midge Minium, MD;  Location: [facility] ENDOSCOPY;  Service: Endoscopy;  Laterality: N/A;   POLYPECTOMY N/A 05/13/2016   Procedure: POLYPECTOMY;  Surgeon: Midge Minium, MD;  Location: [facility] SURGERY [facility];  Service: Endoscopy;  Laterality: N/A;   POLYPECTOMY  04/06/2023   Procedure: POLYPECTOMY;  Surgeon: Midge Minium, MD;  Location: [facility] ENDOSCOPY;  Service: Endoscopy;;   Social History   Tobacco Use   Smoking status: Never    Passive exposure: Never   Smokeless tobacco: Never  Vaping Use   Vaping status: Never Used  Substance Use Topics   Alcohol use: No    Alcohol/week: 0.0 standard drinks of alcohol   Drug use: Never   Social History   Socioeconomic History   Marital status: Married    Spouse name: Not on file   Number of children: Not on file   Years of education: Not on file   Highest education level: Not on file  Occupational History   Not on file  Tobacco Use   Smoking status: Never    Passive exposure: Never   Smokeless tobacco: Never  Vaping Use   Vaping status: Never Used  Substance and Sexual Activity   Alcohol use: No    Alcohol/week: 0.0 standard drinks of alcohol   Drug use:  Never   Sexual activity: Not Currently    Birth control/protection: Post-menopausal  Other Topics Concern   Not on file  Social History Narrative   Not on file   Social Drivers of Health   Financial Resource Strain: Not on file  Food Insecurity: No Food Insecurity (05/05/2023)   Hunger Vital Sign    Worried About Running Out of Food in the Last Year: Never true    Ran Out of Food in the Last Year: Never true  Transportation Needs: No Transportation Needs (05/05/2023)   PRAPARE - Administrator, Civil Service (Medical): No    Lack of Transportation (Non-Medical): No  Physical Activity: Not on file  Stress: Not on file  Social Connections: Not on file  Intimate Partner Violence: Not At Risk (05/05/2023)   Humiliation, Afraid, Rape, and Kick questionnaire    Fear of Current or Ex-Partner: No    Emotionally Abused: No    Physically Abused: No    Sexually Abused: No   Family Status  Relation Name Status   Mother  Deceased   Father  Deceased   Sister  (Not Specified)   Oceanographer  (Not Specified)   Sister  Deceased  No partnership data on file   Family History  Problem Relation Age of Onset   Stroke Mother    Breast cancer Sister 60       breast ca x3   Colon cancer Paternal Aunt    Cancer Sister    Allergies  Allergen Reactions   Molds & Smuts Anaphylaxis   Penicillins Nausea And Vomiting and Rash    Has patient had a PCN reaction causing immediate rash, facial/tongue/throat swelling, SOB or lightheadedness with hypotension: Yes Has patient had a PCN reaction causing severe rash involving mucus membranes or skin necrosis: No Has patient had a PCN reaction that required hospitalization: No Has patient had a PCN reaction occurring within the last 10 years: No If all of the above answers are "NO", then may proceed with Cephalosporin use.       Review of Systems  Constitutional:  Negative for chills and fever.  Respiratory:  Positive for shortness of breath.  Negative for wheezing.   Cardiovascular:  Negative for chest pain and palpitations.      Objective:     BP 128/84   Pulse 98   Temp 97.9 F (36.6 C) (Oral)   Resp 16   Ht 5\' 5"  (1.651 m)   Wt 159 lb 3.2 oz (72.2 kg)   SpO2 99%   BMI 26.49 kg/m  BP Readings from Last 3 Encounters:  08/22/23 128/84  08/07/23 132/84  08/01/23 (!) 166/87   Wt Readings from  Last 3 Encounters:  08/22/23 159 lb 3.2 oz (72.2 kg)  08/07/23 161 lb 14.4 oz (73.4 kg)  08/01/23 158 lb 11.2 oz (72 kg)      Physical Exam Constitutional:      Appearance: Normal appearance.  HENT:     Head: Normocephalic and atraumatic.  Eyes:     Conjunctiva/sclera: Conjunctivae normal.  Cardiovascular:     Rate and Rhythm: Normal rate. Rhythm irregular.  Pulmonary:     Effort: Pulmonary effort is normal.     Breath sounds: Normal breath sounds.  Skin:    General: Skin is warm and dry.  Neurological:     General: No focal deficit present.     Mental Status: She is alert. Mental status is at baseline.  Psychiatric:        Mood and Affect: Mood normal.        Behavior: Behavior normal.      No results found for any visits on 08/22/23.  Last CBC Lab Results  Component Value Date   WBC 5.0 07/31/2023   HGB 13.0 07/31/2023   HCT 40.8 07/31/2023   MCV 88.1 07/31/2023   MCH 28.1 07/31/2023   RDW 18.0 (H) 07/31/2023   PLT 230 07/31/2023   Last metabolic panel Lab Results  Component Value Date   GLUCOSE 98 05/14/2023   NA 138 05/14/2023   K 3.6 05/14/2023   CL 105 05/14/2023   CO2 25 05/14/2023   BUN 14 05/14/2023   CREATININE 0.87 05/14/2023   GFRNONAA >60 05/14/2023   CALCIUM 8.5 (L) 05/14/2023   PHOS 3.5 02/26/2018   PROT 6.8 05/14/2023   ALBUMIN 3.7 05/14/2023   BILITOT 0.7 05/14/2023   ALKPHOS 86 05/14/2023   AST 19 05/14/2023   ALT 15 05/14/2023   ANIONGAP 8 05/14/2023   Last lipids Lab Results  Component Value Date   CHOL 141 01/03/2023   HDL 61 01/03/2023   LDLCALC 66  01/03/2023   TRIG 64 01/03/2023   CHOLHDL 2.3 01/03/2023   Last hemoglobin A1c Lab Results  Component Value Date   HGBA1C 5.4 07/05/2023   Last thyroid functions Lab Results  Component Value Date   TSH 1.73 04/26/2023   Last vitamin D Lab Results  Component Value Date   VD25OH 49 04/26/2023   Last vitamin B12 and Folate Lab Results  Component Value Date   VITAMINB12 412 04/26/2023      The 10-year ASCVD risk score (Arnett DK, et al., 2019) is: 12.4%    Assessment & Plan:  Benign essential HTN Assessment & Plan: Blood pressure stable here today, no changes made to medications.  Orders: -     Ambulatory referral to Cardiology  Paroxysmal atrial fibrillation [facility]) Assessment & Plan: Patient wanting to transfer care to a local Cardiologist anyway, does have an appointment with her Cardiologist in [place] in February. Will place a referral to get established here and for echocardiogram. Last echo was in 2022, EF 55% without valve abnormalities.   Orders: -     Ambulatory referral to Cardiology     Return for already scheduled.    Margarita Mail, DO

## 2023-08-22 NOTE — Assessment & Plan Note (Signed)
Blood pressure stable here today, no changes made to medications.

## 2023-08-22 NOTE — Assessment & Plan Note (Signed)
Patient wanting to transfer care to a local Cardiologist anyway, does have an appointment with her Cardiologist in [place] in February. Will place a referral to get established here and for echocardiogram. Last echo was in 2022, EF 55% without valve abnormalities.

## 2023-08-24 ENCOUNTER — Other Ambulatory Visit: Payer: Self-pay

## 2023-08-24 ENCOUNTER — Encounter: Payer: Self-pay | Admitting: Emergency Medicine

## 2023-08-24 ENCOUNTER — Emergency Department: Payer: Medicare HMO

## 2023-08-24 ENCOUNTER — Emergency Department
Admission: EM | Admit: 2023-08-24 | Discharge: 2023-08-24 | Disposition: A | Discharge status: Home / Self Care | Payer: Medicare HMO | Attending: Emergency Medicine | Admitting: Emergency Medicine

## 2023-08-24 ENCOUNTER — Ambulatory Visit: Payer: Medicare Other | Admitting: Podiatry

## 2023-08-24 DIAGNOSIS — S0990XA Unspecified injury of head, initial encounter: Secondary | ICD-10-CM | POA: Diagnosis not present

## 2023-08-24 DIAGNOSIS — U071 COVID-19: Secondary | ICD-10-CM | POA: Insufficient documentation

## 2023-08-24 DIAGNOSIS — G8929 Other chronic pain: Secondary | ICD-10-CM | POA: Insufficient documentation

## 2023-08-24 DIAGNOSIS — M797 Fibromyalgia: Secondary | ICD-10-CM | POA: Insufficient documentation

## 2023-08-24 DIAGNOSIS — I10 Essential (primary) hypertension: Secondary | ICD-10-CM | POA: Insufficient documentation

## 2023-08-24 DIAGNOSIS — R058 Other specified cough: Secondary | ICD-10-CM

## 2023-08-24 DIAGNOSIS — R059 Cough, unspecified: Secondary | ICD-10-CM | POA: Insufficient documentation

## 2023-08-24 DIAGNOSIS — I480 Paroxysmal atrial fibrillation: Secondary | ICD-10-CM | POA: Insufficient documentation

## 2023-08-24 DIAGNOSIS — Z7901 Long term (current) use of anticoagulants: Secondary | ICD-10-CM | POA: Insufficient documentation

## 2023-08-24 DIAGNOSIS — W228XXA Striking against or struck by other objects, initial encounter: Secondary | ICD-10-CM | POA: Insufficient documentation

## 2023-08-24 DIAGNOSIS — R519 Headache, unspecified: Secondary | ICD-10-CM | POA: Diagnosis not present

## 2023-08-24 DIAGNOSIS — I771 Stricture of artery: Secondary | ICD-10-CM | POA: Diagnosis not present

## 2023-08-24 DIAGNOSIS — R5383 Other fatigue: Secondary | ICD-10-CM | POA: Insufficient documentation

## 2023-08-24 LAB — BASIC METABOLIC PANEL
Anion gap: 10 (ref 5–15)
BUN: 12 mg/dL (ref 8–23)
CO2: 23 mmol/L (ref 22–32)
Calcium: 8.3 mg/dL — ABNORMAL LOW (ref 8.9–10.3)
Chloride: 103 mmol/L (ref 98–111)
Creatinine, Ser: 0.95 mg/dL (ref 0.44–1.00)
GFR, Estimated: >60 mL/min (ref >60)
Glucose, Bld: 98 mg/dL (ref 70–99)
Potassium: 3.6 mmol/L (ref 3.5–5.1)
Sodium: 136 mmol/L (ref 135–145)

## 2023-08-24 LAB — CBC WITH DIFFERENTIAL/PLATELET
Abs Immature Granulocytes: 0.02 10*3/uL (ref 0.00–0.07)
Basophils Absolute: 0 10*3/uL (ref 0.0–0.1)
Basophils Relative: 1 %
Eosinophils Absolute: 0 10*3/uL (ref 0.0–0.5)
Eosinophils Relative: 1 %
HCT: 38.8 % (ref 36.0–46.0)
Hemoglobin: 12.7 g/dL (ref 12.0–15.0)
Immature Granulocytes: 0 %
Lymphocytes Relative: 12 %
Lymphs Abs: 0.8 10*3/uL (ref 0.7–4.0)
MCH: 28.7 pg (ref 26.0–34.0)
MCHC: 32.7 g/dL (ref 30.0–36.0)
MCV: 87.6 fL (ref 80.0–100.0)
Monocytes Absolute: 0.7 10*3/uL (ref 0.1–1.0)
Monocytes Relative: 11 %
Neutro Abs: 4.5 10*3/uL (ref 1.7–7.7)
Neutrophils Relative %: 75 %
Platelets: 188 10*3/uL (ref 150–400)
RBC: 4.43 MIL/uL (ref 3.87–5.11)
RDW: 16 % — ABNORMAL HIGH (ref 11.5–15.5)
WBC: 6 10*3/uL (ref 4.0–10.5)
nRBC: 0 % (ref 0.0–0.2)

## 2023-08-24 LAB — RESP PANEL BY RT-PCR (RSV, FLU A&B, COVID)  RVPGX2
Influenza A by PCR: NEGATIVE
Influenza B by PCR: NEGATIVE
Resp Syncytial Virus by PCR: NEGATIVE
SARS Coronavirus 2 by RT PCR: POSITIVE — AB

## 2023-08-24 LAB — BRAIN NATRIURETIC PEPTIDE: B Natriuretic Peptide: 46.7 pg/mL (ref 0.0–100.0)

## 2023-08-24 LAB — TROPONIN I (HIGH SENSITIVITY): Troponin I (High Sensitivity): 13 ng/L (ref <18)

## 2023-08-24 MED ORDER — GUAIFENESIN-CODEINE 100-10 MG/5ML PO SOLN: 5.0000 mL | Freq: Three times a day (TID) | ORAL | 0 refills | Status: AC | PRN

## 2023-08-24 MED ORDER — ACETAMINOPHEN 500 MG PO TABS
1000.0000 mg | ORAL_TABLET | Freq: Once | ORAL | Status: AC
  Administered 2023-08-24: 1000 mg via ORAL
  Filled 2023-08-24: qty 2

## 2023-08-24 NOTE — ED Triage Notes (Signed)
Patient ambulatory to triage with steady gait, without difficulty or distress noted; pt reports fell and hit her head few wks ago (currently taking eliquist); cont to have rt sided HA and since yesterday having increasing weakness and nonprod cough

## 2023-08-24 NOTE — ED Provider Notes (Addendum)
[facility] Provider Note    Event Date/Time   First MD Initiated Contact with Patient 08/24/23 [phone]     (approximate)   History   Weakness   HPI  Sonya Harper is a 77 y.o. female   Past medical history of paroxysmal atrial fibrillation on Eliquis, fibromyalgia, hypertension, chronic lower back pain, who presents to the emergency department with ongoing headache after sustaining head injury greater than 1 week ago.  She was bending over reaching for something and hit her head.  She did not lose consciousness but she does take Eliquis and over the next few days had ongoing headache so got a CT scan on 08/22/2023 for which she did not get the results yet.  She ongoing dull headache since then.  In the last 2 days she also developed a dry cough and feels more fatigued than usual.  She has not had fever or chills.  She denies abdominal pain nausea vomiting diarrhea.  No urinary symptoms.  She has no chest pain or shortness of breath.  Independent Historian contributed to assessment above: Daughter is at bedside to corroborate information past medical history as above  External Medical Documents Reviewed: CT scan obtained 08/22/2023 which I reviewed and my independent review there is no obvious bleeding or midline shift      Physical Exam   Triage Vital Signs: ED Triage Vitals  Encounter Vitals Group     BP      Systolic BP Percentile      Diastolic BP Percentile      Pulse      Resp      Temp      Temp src      SpO2      Weight      Height      Head Circumference      Peak Flow      Pain Score      Pain Loc      Pain Education      Exclude from Growth Chart     Most recent vital signs: Vitals:   08/24/23 0538  BP: (!) 141/91  Pulse: 81  Resp: 19  Temp: 98.7 F (37.1 C)  SpO2: 96%    General: Awake, no distress.  CV:  Good peripheral perfusion.  Resp:  Normal effort.  Abd:  No distention.  Other:  Awake alert pleasant woman in  no acute distress.  Clear lungs to auscultation bilateral without focality or wheezing, soft nontender abdomen.  Skin appears warm well-perfused, she is comfortable nontoxic-appearing.  Neck is supple with full range of motion.  There is no C-spine tenderness no obvious head trauma or tenderness to palpation of the head or facial bones.   ED Results / Procedures / Treatments   Labs (all labs ordered are listed, but only abnormal results are displayed) Labs Reviewed  RESP PANEL BY RT-PCR (RSV, FLU A&B, COVID)  RVPGX2 - Abnormal; Notable for the following components:      Result Value   SARS Coronavirus 2 by RT PCR POSITIVE (*)    All other components within normal limits  CBC WITH DIFFERENTIAL/PLATELET - Abnormal; Notable for the following components:   RDW 16.0 (*)    All other components within normal limits  BRAIN NATRIURETIC PEPTIDE  BASIC METABOLIC PANEL  TROPONIN I (HIGH SENSITIVITY)     I ordered and reviewed the above labs they are notable for WBC H&H wnl   ED ECG REPORT  Ross Marcus, the attending physician, personally viewed and interpreted this ECG.   Date: 08/24/2023  EKG Time: 0531  Rate: 88  Rhythm: sinus  Axis: nl  Intervals: none  ST&T Change: no stemi  RADIOLOGY I independently reviewed and interpreted chest x-ray and I see no obvious focality or pneumothorax I also reviewed radiologist's formal read.   PROCEDURES:  Critical Care performed: No  Procedures   MEDICATIONS ORDERED IN ED: Medications - No data to display  IMPRESSION / MDM / ASSESSMENT AND PLAN / ED COURSE  I reviewed the triage vital signs and the nursing notes.                                Patient's presentation is most consistent with acute presentation with potential threat to life or bodily function.  Differential diagnosis includes, but is not limited to, ICH, concussion, viral URI or bacterial pneumonia   The patient is on the cardiac monitor to evaluate for evidence of  arrhythmia and/or significant heart rate changes.  MDM:    Two separate issues to address today.   Headache/injury -  She had a head injury over 1 week ago and had a CT scan performed as an outpatient and has no formal read as of yet and on my interpretation of the CT scan I see no obvious bleeding or midline shift.  I spoke w radiology on the phone for review of 08/22/2023 scan to ensure no signs of emergent intracranial injury, they noted that they will change the priority to stat and have it read today.  She had no subsequent trauma so we will defer further imaging at this time.  Symptoms may be due to concussion.  Cough/fatigue -he had a dry cough and fatigue over the last 2 days.  No fever or chills.  Respiratory infectious symptoms will get viral swabs, as well as chest x-ray.  Nontoxic appearance no respiratory distress, doubt sepsis. Consider ACS but no chest pain; check ECG/trops/labs.     -- COVID+ w walking sats 90+% Discussed Paxlovid but given that she is on Eliquis, will not prescribe until she is able to further discuss with her doctor.  In terms of COVID infection she looks well enough to be discharged with close PMD follow-up and given strict return precautions.  DC pending CT head read.      FINAL CLINICAL IMPRESSION(S) / ED DIAGNOSES   Final diagnoses:  Traumatic injury of head, initial encounter  Dry cough  Other fatigue  COVID     Rx / DC Orders   ED Discharge Orders     None        Note:  This document was prepared using Dragon voice recognition software and may include unintentional dictation errors.    Pilar Jarvis, MD 08/24/23 2130    Pilar Jarvis, MD 08/24/23 [phone]

## 2023-08-24 NOTE — ED Provider Notes (Signed)
7:41 AM Assumed care for off going team.   Blood pressure (!) 141/91, pulse 81, temperature 98.7 F (37.1 C), resp. rate 19, height 5\' 5"  (1.651 m), weight 68 kg, SpO2 96%.  See their HPI for full report but in brief pending BMP/CT head read.  IMPRESSION: No evidence of intracranial injury.  BMP and BNP normal  Pt is requesting some codeine cough syrup.  I discussed with her Jerilynn Som but patient states that's do not work for her.  We discussed the risk of codeine cough syrup including increasing risk for falls especially given the fact that she just had a CT scan done for some head trauma.  Patient is adamant that codeine cough syrup is the only did not help her cough and she understands the risk including increased risk for fall and she is still like to proceed with it.  We discussed using Tylenol 1 g every 8 hours for pain for her headache.    DC instructions were provided by Dr. Modesto Charon but I added in opioid information.        Concha Se, MD 08/24/23 [phone]

## 2023-08-24 NOTE — Discharge Instructions (Addendum)
Drink plenty of fluids to stay well-hydrated.  For aches and pains and fever you can take Tylenol.  Take Tylenol 1 g every 8 hours for 1 week to help with pain.  The cough syrup with codeine can increase your risk for falls please use at nighttime first to see how you react to it and try to take the least amount that still effective.  We spoke about Paxlovid.  This medication is used for COVID but may not be safe in patients to take Eliquis.  Talk to your doctor about alternatives that are safe to take in combination with your other medications   Thank you for choosing [place] for your health care today!  Please see your primary doctor this week for a follow up appointment.   If you have any new, worsening, or unexpected symptoms call your doctor right away or come back to the emergency department for reevaluation.  It was my pleasure to care for you today.   Daneil Dan Modesto Charon, MD   Take oxycodone as prescribed. Do not drink alcohol, drive or participate in any other potentially dangerous activities while taking this medication as it may make you sleepy. Do not take this medication with any other sedating medications, either prescription or over-the-counter. If you were prescribed Percocet or Vicodin, do not take these with acetaminophen (Tylenol) as it is already contained within these medications.  This medication is an opiate (or narcotic) pain medication and can be habit forming. Use it as little as possible to achieve adequate pain control. Do not use or use it with extreme caution if you have a history of opiate abuse or dependence. If you are on a pain contract with your primary care doctor or a pain specialist, be sure to let them know you were prescribed this medication today from the Emergency Department. This medication is intended for your use only - do not give any to anyone else and keep it in a secure place where nobody else, especially children, have access to it.

## 2023-09-05 ENCOUNTER — Telehealth: Payer: Self-pay

## 2023-09-05 NOTE — Progress Notes (Signed)
 Transition Care Management Unsuccessful Follow-up Telephone Call  Date of discharge and from where:  08/24/2023 [facility]  Attempts:  1st Attempt  Reason for unsuccessful TCM follow-up call:  No answer/busy  Kimberl Vig [facility]  [facility], [facility] Guide Direct Dial: [phone]  Website: [email or web address]

## 2023-09-08 ENCOUNTER — Telehealth: Payer: Self-pay

## 2023-09-08 NOTE — Progress Notes (Signed)
 Transition Care Management Unsuccessful Follow-up Telephone Call  Date of discharge and from where:  08/24/2023 [facility]  Attempts:  2nd Attempt  Reason for unsuccessful TCM follow-up call:  No answer/busy  Deasiah Hagberg [facility]  [facility], [facility] Guide Direct Dial: [phone]  Website: [email or web address]

## 2023-09-18 DIAGNOSIS — G4719 Other hypersomnia: Secondary | ICD-10-CM | POA: Diagnosis not present

## 2023-09-18 DIAGNOSIS — J411 Mucopurulent chronic bronchitis: Secondary | ICD-10-CM | POA: Diagnosis not present

## 2023-09-19 ENCOUNTER — Telehealth: Payer: Self-pay | Admitting: *Deleted

## 2023-09-19 ENCOUNTER — Other Ambulatory Visit: Payer: Self-pay

## 2023-09-19 ENCOUNTER — Encounter: Payer: Self-pay | Admitting: Oncology

## 2023-09-19 DIAGNOSIS — D509 Iron deficiency anemia, unspecified: Secondary | ICD-10-CM

## 2023-09-19 NOTE — Telephone Encounter (Signed)
 Patient called reporting "extreme weakness" and is requesting to come in for lab check to see if she needs an infusion. Her last appointment was 11/26 and next appointment is 5/29 lab, 6/2 doctor/infusion. Please advise

## 2023-09-20 ENCOUNTER — Inpatient Hospital Stay: Payer: Medicare HMO | Attending: Oncology

## 2023-09-20 DIAGNOSIS — D509 Iron deficiency anemia, unspecified: Secondary | ICD-10-CM | POA: Diagnosis not present

## 2023-09-20 LAB — IRON AND TIBC
Iron: 60 ug/dL (ref 28–170)
Saturation Ratios: 16 % (ref 10.4–31.8)
TIBC: 375 ug/dL (ref 250–450)
UIBC: 315 ug/dL

## 2023-09-20 LAB — FERRITIN: Ferritin: 28 ng/mL (ref 11–307)

## 2023-09-20 LAB — CBC WITH DIFFERENTIAL (CANCER CENTER ONLY)
Abs Immature Granulocytes: 0.01 10*3/uL (ref 0.00–0.07)
Basophils Absolute: 0 10*3/uL (ref 0.0–0.1)
Basophils Relative: 0 %
Eosinophils Absolute: 0.1 10*3/uL (ref 0.0–0.5)
Eosinophils Relative: 2 %
HCT: 38.2 % (ref 36.0–46.0)
Hemoglobin: 12.5 g/dL (ref 12.0–15.0)
Immature Granulocytes: 0 %
Lymphocytes Relative: 39 %
Lymphs Abs: 1.9 10*3/uL (ref 0.7–4.0)
MCH: 29.6 pg (ref 26.0–34.0)
MCHC: 32.7 g/dL (ref 30.0–36.0)
MCV: 90.3 fL (ref 80.0–100.0)
Monocytes Absolute: 0.4 10*3/uL (ref 0.1–1.0)
Monocytes Relative: 8 %
Neutro Abs: 2.5 10*3/uL (ref 1.7–7.7)
Neutrophils Relative %: 51 %
Platelet Count: 199 10*3/uL (ref 150–400)
RBC: 4.23 MIL/uL (ref 3.87–5.11)
RDW: 15 % (ref 11.5–15.5)
WBC Count: 4.9 10*3/uL (ref 4.0–10.5)
nRBC: 0 % (ref 0.0–0.2)

## 2023-10-23 DIAGNOSIS — I4891 Unspecified atrial fibrillation: Secondary | ICD-10-CM | POA: Diagnosis not present

## 2023-10-30 ENCOUNTER — Ambulatory Visit: Payer: Medicare HMO | Admitting: Cardiology

## 2023-11-09 ENCOUNTER — Other Ambulatory Visit: Payer: Self-pay | Admitting: Internal Medicine

## 2023-11-09 DIAGNOSIS — E782 Mixed hyperlipidemia: Secondary | ICD-10-CM

## 2023-11-09 NOTE — Telephone Encounter (Signed)
 Requested Prescriptions  Pending Prescriptions Disp Refills   atorvastatin (LIPITOR) 10 MG tablet [Pharmacy Med Name: ATORVASTATIN 10 MG TABLET] 45 tablet 1    Sig: TAKE 1 TABLET BY MOUTH EVERY OTHER DAY     Cardiovascular:  Antilipid - Statins Failed - 11/09/2023  5:56 PM      Failed - Lipid Panel in normal range within the last 12 months    Cholesterol  Date Value Ref Range Status  01/03/2023 141 <200 mg/dL Final   LDL Cholesterol (Calc)  Date Value Ref Range Status  01/03/2023 66 mg/dL (calc) Final    Comment:    Reference range: <100 . Desirable range <100 mg/dL for primary prevention;   <70 mg/dL for patients with CHD or diabetic patients  with > or = 2 CHD risk factors. Marland Kitchen LDL-C is now calculated using the Martin-Hopkins  calculation, which is a validated novel method providing  better accuracy than the Friedewald equation in the  estimation of LDL-C.  Horald Pollen et al. Lenox Ahr. 1610;960(45): 2061-2068  (http://education.[employer].com/faq/FAQ164)    HDL  Date Value Ref Range Status  01/03/2023 61 > OR = 50 mg/dL Final   Triglycerides  Date Value Ref Range Status  01/03/2023 64 <150 mg/dL Final         Passed - Patient is not pregnant      Passed - Valid encounter within last 12 months    Recent Outpatient Visits           2 months ago Benign essential HTN   [facility] Margarita Mail, DO   3 months ago Traumatic injury of head, initial encounter   [facility] Margarita Mail, DO   3 months ago No-show for appointment   [facility] Mecum, Oswaldo Conroy, PA-C   4 months ago Benign essential HTN   [facility] Margarita Mail, DO   5 months ago Benign essential HTN   [facility] Margarita Mail, DO       Future Appointments             In 1 month Margarita Mail, DO [place] [facility]    In 6 months Richardo Hanks, Laurette Schimke, MD [facility] [place]

## 2023-11-10 DIAGNOSIS — I480 Paroxysmal atrial fibrillation: Secondary | ICD-10-CM | POA: Diagnosis not present

## 2023-11-10 DIAGNOSIS — I517 Cardiomegaly: Secondary | ICD-10-CM | POA: Diagnosis not present

## 2023-11-16 ENCOUNTER — Other Ambulatory Visit: Payer: Self-pay | Admitting: Internal Medicine

## 2023-11-16 DIAGNOSIS — G4700 Insomnia, unspecified: Secondary | ICD-10-CM

## 2023-11-16 NOTE — Telephone Encounter (Signed)
 Requested Prescriptions  Pending Prescriptions Disp Refills   traZODone (DESYREL) 100 MG tablet [Pharmacy Med Name: traZODone 100 MG TABLET] 90 tablet 1    Sig: TAKE 1 TABLET BY MOUTH AT BEDTIME     Psychiatry: Antidepressants - Serotonin Modulator Passed - 11/16/2023  1:31 PM      Passed - Valid encounter within last 6 months    Recent Outpatient Visits           2 months ago Benign essential HTN   [facility] Margarita Mail, DO   3 months ago Traumatic injury of head, initial encounter   [facility] Margarita Mail, DO   3 months ago No-show for appointment   [facility] Mecum, Oswaldo Conroy, PA-C   4 months ago Benign essential HTN   [facility] Margarita Mail, DO   5 months ago Benign essential HTN   [facility] Margarita Mail, DO       Future Appointments             In 1 month Margarita Mail, DO [place] [facility]   In 6 months Richardo Hanks, Laurette Schimke, MD [facility] [place]

## 2023-11-21 DIAGNOSIS — I480 Paroxysmal atrial fibrillation: Secondary | ICD-10-CM | POA: Diagnosis not present

## 2023-11-30 ENCOUNTER — Telehealth: Payer: Self-pay | Admitting: Oncology

## 2023-11-30 NOTE — Telephone Encounter (Signed)
 Ok to move up appt ?

## 2023-11-30 NOTE — Telephone Encounter (Signed)
 Patient called to say her heart dr states she may be having issues due to being anemic again. She is requesting a sooner appointment with Dr. Cathie Hoops than 6/2. Please advise how to get her scheduled

## 2023-12-04 ENCOUNTER — Inpatient Hospital Stay: Attending: Oncology

## 2023-12-04 ENCOUNTER — Other Ambulatory Visit: Payer: Self-pay | Admitting: Internal Medicine

## 2023-12-04 DIAGNOSIS — I10 Essential (primary) hypertension: Secondary | ICD-10-CM

## 2023-12-04 DIAGNOSIS — D509 Iron deficiency anemia, unspecified: Secondary | ICD-10-CM | POA: Diagnosis not present

## 2023-12-04 LAB — IRON AND TIBC
Iron: 74 ug/dL (ref 28–170)
Saturation Ratios: 18 % (ref 10.4–31.8)
TIBC: 416 ug/dL (ref 250–450)
UIBC: 342 ug/dL

## 2023-12-04 LAB — CBC WITH DIFFERENTIAL (CANCER CENTER ONLY)
Abs Immature Granulocytes: 0.02 10*3/uL (ref 0.00–0.07)
Basophils Absolute: 0 10*3/uL (ref 0.0–0.1)
Basophils Relative: 0 %
Eosinophils Absolute: 0.1 10*3/uL (ref 0.0–0.5)
Eosinophils Relative: 2 %
HCT: 41.1 % (ref 36.0–46.0)
Hemoglobin: 13.4 g/dL (ref 12.0–15.0)
Immature Granulocytes: 0 %
Lymphocytes Relative: 25 %
Lymphs Abs: 1.4 10*3/uL (ref 0.7–4.0)
MCH: 29.1 pg (ref 26.0–34.0)
MCHC: 32.6 g/dL (ref 30.0–36.0)
MCV: 89.2 fL (ref 80.0–100.0)
Monocytes Absolute: 0.4 10*3/uL (ref 0.1–1.0)
Monocytes Relative: 7 %
Neutro Abs: 3.5 10*3/uL (ref 1.7–7.7)
Neutrophils Relative %: 66 %
Platelet Count: 200 10*3/uL (ref 150–400)
RBC: 4.61 MIL/uL (ref 3.87–5.11)
RDW: 13.9 % (ref 11.5–15.5)
WBC Count: 5.3 10*3/uL (ref 4.0–10.5)
nRBC: 0 % (ref 0.0–0.2)

## 2023-12-04 LAB — RETIC PANEL
Immature Retic Fract: 7.9 % (ref 2.3–15.9)
RBC.: 4.62 MIL/uL (ref 3.87–5.11)
Retic Count, Absolute: 44.8 10*3/uL (ref 19.0–186.0)
Retic Ct Pct: 1 % (ref 0.4–3.1)
Reticulocyte Hemoglobin: 31.8 pg (ref >27.9)

## 2023-12-04 LAB — FERRITIN: Ferritin: 18 ng/mL (ref 11–307)

## 2023-12-06 NOTE — Telephone Encounter (Signed)
 Requested Prescriptions  Pending Prescriptions Disp Refills   losartan (COZAAR) 100 MG tablet [Pharmacy Med Name: LOSARTAN POTASSIUM 100 MG TAB] 90 tablet 0    Sig: TAKE 1 TABLET BY MOUTH DAILY     Cardiovascular:  Angiotensin Receptor Blockers Failed - 12/06/2023  3:44 PM      Failed - Last BP in normal range    BP Readings from Last 1 Encounters:  08/24/23 (!) 147/61         Failed - Valid encounter within last 6 months    Recent Outpatient Visits   None     Future Appointments             In 4 weeks Margarita Mail, DO [place] [facility]   In 5 months Sondra Come, MD [facility] [place]            Passed - Cr in normal range and within 180 days    Creat  Date Value Ref Range Status  04/26/2023 0.94 0.60 - 1.00 mg/dL Final   Creatinine, Ser  Date Value Ref Range Status  08/24/2023 0.95 0.44 - 1.00 mg/dL Final   Creatinine, Urine  Date Value Ref Range Status  10/13/2017 184 20 - 275 mg/dL Final         Passed - K in normal range and within 180 days    Potassium  Date Value Ref Range Status  08/24/2023 3.6 3.5 - 5.1 mmol/L Final  06/03/2012 3.9 3.5 - 5.1 mmol/L Final         Passed - Patient is not pregnant

## 2023-12-07 ENCOUNTER — Encounter: Payer: Self-pay | Admitting: Oncology

## 2023-12-07 ENCOUNTER — Inpatient Hospital Stay: Attending: Oncology | Admitting: Oncology

## 2023-12-07 ENCOUNTER — Inpatient Hospital Stay

## 2023-12-07 VITALS — BP 149/87 | HR 85 | Temp 97.1°F | Resp 18 | Wt 159.5 lb

## 2023-12-07 DIAGNOSIS — D509 Iron deficiency anemia, unspecified: Secondary | ICD-10-CM | POA: Insufficient documentation

## 2023-12-07 DIAGNOSIS — Z809 Family history of malignant neoplasm, unspecified: Secondary | ICD-10-CM

## 2023-12-07 NOTE — Progress Notes (Signed)
 Hematology/Oncology Consult note [phone] [phone]      Patient Care Team: Margarita Mail, DO as PCP - General (Internal Medicine) Rickard Patience, MD as Consulting Physician (Oncology)   REFERRING PROVIDER: Margarita Mail, DO  CHIEF COMPLAINTS/REASON FOR VISIT:  Iron deficiency anemia  ASSESSMENT & PLAN:  Iron deficiency anemia Labs are reviewed and discussed with patient. Lab Results  Component Value Date   HGB 13.4 12/04/2023   TIBC 416 12/04/2023   IRONPCTSAT 18 12/04/2023   FERRITIN 18 12/04/2023     No need for IV Venofer.  Recommend patient to take oral iron supplementation every other day for maintenance.   Family history of cancer Recommend Dentist. She would like to think about it and update me if she agrees.    Orders Placed This Encounter  Procedures   CBC with Differential (Cancer Center Only)    Standing Status:   Future    Expected Date:   06/07/2024    Expiration Date:   12/06/2024   Iron and TIBC    Standing Status:   Future    Expected Date:   06/07/2024    Expiration Date:   12/06/2024   Ferritin    Standing Status:   Future    Expected Date:   06/07/2024    Expiration Date:   12/06/2024   Retic Panel    Standing Status:   Future    Expected Date:   06/07/2024    Expiration Date:   12/06/2024   Follow up in 6 months. All questions were answered. The patient knows to call the clinic with any problems, questions or concerns.  Rickard Patience, MD, PhD [facility] Hematology Oncology 12/07/2023     HISTORY OF PRESENTING ILLNESS:  Sonya Harper is a  78 y.o.  female with PMH listed below who was referred to me for anemia Reviewed patient's recent labs that was done.  She was found to have abnormal CBC on 04/26/2023, Hb 10.5, mcv 77.8, ferritin 5, iron saturation 5, TIBC 434, B12 412 Reviewed patient's previous labs ordered by primary care physician's office, anemia is since April 2024,  No aggravating or improving  factors. + fatigue.  She denies recent chest pain on exertion, shortness of breath on minimal exertion, pre-syncopal episodes, or palpitations She had not noticed any recent bleeding such as epistaxis, hematuria or hematochezia.  She is 5mg  BID for Afib.  Her last EGD and colonoscopy were on 04/06/2023, Tubular adenoma and hyperplastic polyps. She just had capsule endoscopy done, result is negative.   INTERVAL HISTORY Sonya Harper is a 78 y.o. female who has above history reviewed by me today presents for follow up visit for iron deficiency anemia.  She tolerated IV Venofer treatments.  No new complains.     MEDICAL HISTORY:  Past Medical History:  Diagnosis Date   A-fib [facility])    Allergy    Arthritis    feet   Asthma    Breast neoplasm    Chronic lower back pain    s/p fall injury   Depression    Dysrhythmia    Fibromyalgia    Hypertension    Hypothyroidism    Lumbar facet arthropathy 11/06/2018   Prediabetes 02/27/2018   Sciatica of right side    Wears dentures    partial upper and lower    SURGICAL HISTORY: Past Surgical History:  Procedure Laterality Date   ABDOMINAL HYSTERECTOMY     BIOPSY  04/06/2023   Procedure: BIOPSY;  Surgeon: Midge Minium, MD;  Location: [facility] ENDOSCOPY;  Service: Endoscopy;;   CATARACT EXTRACTION W/PHACO Right 02/02/2022   Procedure: CATARACT EXTRACTION PHACO AND INTRAOCULAR LENS PLACEMENT (IOC) RIGHT;  Surgeon: Lockie Mola, MD;  [facility];  Service: Ophthalmology;  Laterality: Right;  4.52 00:45.9   CATARACT EXTRACTION W/PHACO Left 02/16/2022   Procedure: CATARACT EXTRACTION PHACO AND INTRAOCULAR LENS PLACEMENT (IOC) LEFT 7.05 01:19.8;  Surgeon: Lockie Mola, MD;  Location: [facility]ERY [facility];  Service: Ophthalmology;  Laterality: Left;   COLONOSCOPY WITH PROPOFOL N/A 05/13/2016   Procedure: COLONOSCOPY WITH PROPOFOL;  Surgeon: Midge Minium, MD;  Location[facility];  Service: Endoscopy;   Laterality: N/A;   COLONOSCOPY WITH PROPOFOL N/A 09/20/2019   Procedure: COLONOSCOPY WITH PROPOFOL;  Surgeon: Midge Minium, MD;  Location[facility] END[facility];  Service: Endoscopy;  Laterality: N/A;   COLONOSCOPY WITH PROPOFOL N/A 04/06/2023   Procedure: COLONOSCOPY WITH PROPOFOL;  Surgeon: Midge Minium, MD;  Location: [facility] ENDOSCOPY;  Service: Endoscopy;  Laterality: N/A;   ELECTROPHYSIOLOGIC STUDY     Ablation   ESOPHAGOGASTRODUODENOSCOPY (EGD) WITH PROPOFOL N/A 09/20/2019   Procedure: ESOPHAGOGASTRODUODENOSCOPY (EGD) WITH PROPOFOL;  Surgeon: Midge Minium, MD;  Location: [facility] ENDOSCOPY;  Service: Endoscopy;  Laterality: N/A;   ESOPHAGOGASTRODUODENOSCOPY (EGD) WITH PROPOFOL N/A 04/06/2023   Procedure: ESOPHAGOGASTRODUODENOSCOPY (EGD) WITH PROPOFOL;  Surgeon: Midge Minium, MD;  Location: [facility] ENDOSCOPY;  Service: Endoscopy;  Laterality: N/A;   GIVENS CAPSULE STUDY N/A 05/02/2023   Procedure: GIVENS CAPSULE STUDY;  Surgeon: Midge Minium, MD;  Location[facility] ENDOSCOPY;  Service: Endoscopy;  Laterality: N/A;   POLYPECTOMY N/A 05/13/2016   Procedure: POLYPECTOMY;  Surgeon: Midge Minium, MD;  Location[facility];  Service: Endoscopy;  Laterality: N/A;   POLYPECTOMY  04/06/2023   Procedure: POLYPECTOMY;  Surgeon: Midge Minium, MD;  Location: [facility] ENDOSCOPY;  Service: Endoscopy;;    SOCIAL HISTORY: Social History   Socioeconomic History   Marital status: Married    Spouse name: Not on file   Number of children: Not on file   Years of education: Not on file   Highest education level: Not on file  Occupational History   Not on file  Tobacco Use   Smoking status: Never    Passive exposure: Never   Smokeless tobacco: Never  Vaping Use   Vaping status: Never Used  Substance and Sexual Activity   Alcohol use: No    Alcohol/week: 0.0 standard drinks of alcohol   Drug use: Never   Sexual activity: Not Currently    Birth control/protection: Post-menopausal  Other Topics Concern   Not on file   Social History Narrative   Not on file   Social Drivers of Health   Financial Resource Strain: Low Risk  (09/18/2023)   Received from [facility]   Overall Financial Resource Strain (CARDIA)    Difficulty of Paying Living Expenses: Not hard at all  Food Insecurity: No Food Insecurity (09/18/2023)   [facility]   Hunger Vital Sign    Worried About Running Out of Food in the Last Year: Never true    Ran Out of Food in the Last Year: Never true  Transportation Needs: No Transportation Needs (09/18/2023)   [facility]    In the past 12 months, has lack of transportation kept you from medical appointments or from getting medications?: No    Lack of Transportation (Non-Medical): No  Physical Activity: Not on file  Stress: Not on file  Social Connections: Not on file  Intimate Partner Violence: Not At Risk (05/05/2023)   Humiliation, Afraid, Rape, and Kick questionnaire    Fear of Current or Ex-Partner: No    Emotionally Abused: No    Physically Abused: No    Sexually Abused: No    FAMILY HISTORY: Family History  Problem Relation Age of Onset   Stroke Mother    Breast cancer Sister 21       breast ca x3   Cancer Sister    Colon cancer Paternal Aunt    Breast cancer Daughter     ALLERGIES:  is allergic to molds & smuts and penicillins.  MEDICATIONS:  Current Outpatient Medications  Medication Sig Dispense Refill   albuterol (VENTOLIN HFA) 108 (90 Base) MCG/ACT inhaler Inhale 2 puffs into the lungs every 6 (six) hours as needed for wheezing or shortness of breath. 1 each 2   amLODipine (NORVASC) 5 MG tablet Take 1 tablet (5 mg total) by mouth daily. 90 tablet 1   apixaban (ELIQUIS) 5 MG TABS tablet Take 1 tablet (5 mg total) by mouth 2 (two) times daily. 180 tablet 3   atorvastatin (LIPITOR) 10 MG tablet TAKE 1 TABLET BY MOUTH EVERY OTHER DAY 45 tablet 0   Blood Pressure  Monitoring (BLOOD PRESSURE MONITOR/L CUFF) MISC 1 each by Does not apply route daily. 1 each 0   Cholecalciferol (VITAMIN D-1000 MAX ST) 25 MCG (1000 UT) tablet Take by mouth.     fluticasone (FLONASE) 50 MCG/ACT nasal spray Place 2 sprays into both nostrils daily. 16 g 6   fluticasone furoate-vilanterol (BREO ELLIPTA) 200-25 MCG/ACT AEPB Inhale 1 puff into the lungs daily. 1 each 11   Iron, Ferrous Sulfate, 325 (65 Fe) MG TABS Take 325 mg by mouth daily. 90 tablet 1   loratadine (CLARITIN) 10 MG tablet Take 1 tablet (10 mg total) by mouth daily. 30 tablet 11   losartan (COZAAR) 100 MG tablet TAKE 1 TABLET BY MOUTH DAILY 90 tablet 0   mometasone (ELOCON) 0.1 % cream Apply once daily up to 5 days a week for eczema flares 15 g 1   montelukast (SINGULAIR) 10 MG tablet TAKE 1 TABLET BY MOUTH AT BEDTIME 90 tablet 3   mupirocin ointment (BACTROBAN) 2 % Apply 1 Application topically daily. Qd to excision site 22 g 1   nitroGLYCERIN (NITROSTAT) 0.4 MG SL tablet Place 1 tablet (0.4 mg total) under the tongue every 5 (five) minutes as needed for chest pain. Max 3 pills per episode 50 tablet 3   omeprazole (PRILOSEC) 40 MG capsule Take 1 capsule (40 mg total) by mouth 2 (two) times daily. 180 capsule 1   SYNTHROID 50 MCG tablet TAKE 1 BY MOUTH DAILY. DO NOT SUBSTITUTE  0   traZODone (DESYREL) 100 MG tablet TAKE 1 TABLET BY MOUTH AT BEDTIME 90 tablet 1   Vibegron (GEMTESA) 75 MG TABS Take 1 tablet (75 mg total) by mouth daily. 30 tablet 11   DIPHENHYDRAMINE HCL, TOPICAL, (BENADRYL ITCH STOPPING) 2 % GEL Apply 1 application topically in the morning, at noon, and at bedtime. (Patient not taking: Reported on 08/24/2023) 118 mL 1   EPINEPHrine 0.3 mg/0.3 mL IJ SOAJ injection INJECT INTO THE MIDDLE OF THE OUTER THIGH AND HOLD FOR 10 SECONDS AS NEEDED FOR SEVERE ALLERGIC REACTION THEN CALL 911 IF USED (Patient not taking: Reported on 08/01/2023) 2 each 1   ondansetron (ZOFRAN-ODT) 4 MG disintegrating tablet Take 1  tablet (4 mg total) by mouth every 6 (six) hours as needed for nausea or vomiting. (Patient not taking: Reported on 08/24/2023) 20 tablet 0   No current facility-administered medications for this visit.    Review of Systems  Constitutional:  Positive for fatigue. Negative for appetite change, chills and fever.  HENT:   Negative for hearing loss and voice change.   Eyes:  Negative for eye problems.  Respiratory:  Negative for chest tightness and cough.   Cardiovascular:  Negative for chest pain.  Gastrointestinal:  Negative for abdominal distention, abdominal pain and blood in stool.  Endocrine: Negative for hot flashes.  Genitourinary:  Negative for difficulty urinating and frequency.   Musculoskeletal:  Negative for arthralgias.  Skin:  Negative for itching and rash.  Neurological:  Negative for extremity weakness.  Hematological:  Negative for adenopathy.  Psychiatric/Behavioral:  Negative for confusion.     PHYSICAL EXAMINATION: Vitals:   12/07/23 1346 12/07/23 1401  BP: (!) 141/97 (!) 149/87  Pulse: 85   Resp: 18   Temp: (!) 97.1 F (36.2 C)   SpO2: 98%    Filed Weights   12/07/23 1346  Weight: 159 lb 8 oz (72.3 kg)    Physical Exam Constitutional:      General: She is not in acute distress. HENT:     Head: Normocephalic and atraumatic.  Eyes:     General: No scleral icterus. Cardiovascular:     Rate and Rhythm: Normal rate. Rhythm irregular.     Heart sounds: Murmur heard.  Pulmonary:     Effort: Pulmonary effort is normal. No respiratory distress.  Abdominal:     General: Bowel sounds are normal. There is no distension.     Palpations: Abdomen is soft.  Musculoskeletal:        General: No deformity. Normal range of motion.     Cervical back: Normal range of motion and neck supple.  Skin:    General: Skin is warm and dry.     Findings: No erythema or rash.  Neurological:     Mental Status: She is alert and oriented to person, place, and time. Mental  status is at baseline.     Cranial Nerves: No cranial nerve deficit.     Coordination: Coordination normal.  Psychiatric:        Mood and Affect: Mood normal.      LABORATORY DATA:  I have reviewed the data as listed    Latest Ref Rng & Units 12/04/2023   11:17 AM 09/20/2023   10:58 AM 08/24/2023    5:56 AM  CBC  WBC 4.0 - 10.5 K/uL 5.3  4.9  6.0   Hemoglobin 12.0 - 15.0 g/dL 16.1  09.6  04.5   Hematocrit 36.0 - 46.0 % 41.1  38.2  38.8   Platelets 150 - 400 K/uL 200  199  188       Latest Ref Rng & Units 08/24/2023    7:29 AM 05/14/2023   10:42 AM 04/26/2023    8:26 AM  CMP  Glucose 70 - 99 mg/dL 98  98  82   BUN 8 - 23 mg/dL 12  14  16    Creatinine 0.44 - 1.00 mg/dL 4.09  8.11  9.14   Sodium 135 - 145 mmol/L 136  138  140   Potassium 3.5 - 5.1 mmol/L 3.6  3.6  4.1   Chloride 98 - 111 mmol/L 103  105  105   CO2 22 - 32 mmol/L  23  25  28    Calcium 8.9 - 10.3 mg/dL 8.3  8.5  8.8   Total Protein 6.5 - 8.1 g/dL  6.8  6.4   Total Bilirubin 0.3 - 1.2 mg/dL  0.7  0.3   Alkaline Phos 38 - 126 U/L  86    AST 15 - 41 U/L  19  14   ALT 0 - 44 U/L  15  9    Lab Results  Component Value Date   IRON 74 12/04/2023   TIBC 416 12/04/2023   IRONPCTSAT 18 12/04/2023   FERRITIN 18 12/04/2023     RADIOGRAPHIC STUDIES: I have personally reviewed the radiological images as listed and agreed with the findings in the report. No results found.

## 2023-12-07 NOTE — Assessment & Plan Note (Signed)
 Labs are reviewed and discussed with patient. Lab Results  Component Value Date   HGB 13.4 12/04/2023   TIBC 416 12/04/2023   IRONPCTSAT 18 12/04/2023   FERRITIN 18 12/04/2023     No need for IV Venofer.  Recommend patient to take oral iron supplementation every other day for maintenance.

## 2023-12-07 NOTE — Assessment & Plan Note (Signed)
 Recommend Dentist. She would like to think about it and update me if she agrees.

## 2023-12-20 ENCOUNTER — Encounter: Payer: Self-pay | Admitting: Oncology

## 2023-12-25 ENCOUNTER — Other Ambulatory Visit: Payer: Self-pay | Admitting: Internal Medicine

## 2023-12-25 ENCOUNTER — Ambulatory Visit: Payer: Self-pay

## 2023-12-25 DIAGNOSIS — I10 Essential (primary) hypertension: Secondary | ICD-10-CM

## 2023-12-25 DIAGNOSIS — J4530 Mild persistent asthma, uncomplicated: Secondary | ICD-10-CM

## 2023-12-25 MED ORDER — ALBUTEROL SULFATE HFA 108 (90 BASE) MCG/ACT IN AERS: 2.0000 | INHALATION_SPRAY | Freq: Four times a day (QID) | RESPIRATORY_TRACT | 1 refills | Status: AC | PRN

## 2023-12-25 NOTE — Telephone Encounter (Signed)
 Copied from CRM [id number]. Topic: Clinical - Medication Refill >> Dec 25, 2023  1:13 PM Rennis Case wrote: Most Recent Primary Care Visit:  Provider: Rockney Cid  Department: [facility]  Visit Type: OFFICE VISIT  Date: 08/22/2023  Medication: albuterol  (VENTOLIN  HFA) 108 (90 Base) MCG/ACT inhaler  Has the patient contacted their pharmacy? Yes  Is this the correct pharmacy for this prescription? Yes This is the patient's preferred pharmacy:  [facility] [id number] [place], [place] - [address] [place] [address] Phone: [phone] Fax: [phone]   Has the prescription been filled recently? No  Is the patient out of the medication? Yes  Has the patient been seen for an appointment in the last year OR does the patient have an upcoming appointment? Yes  Can we respond through MyChart? Yes  Agent: Please be advised that Rx refills may take up to 3 business days. We ask that you follow-up with your pharmacy.

## 2023-12-25 NOTE — Telephone Encounter (Signed)
 Last OV 08/21/24 Requested Prescriptions  Pending Prescriptions Disp Refills   amLODipine  (NORVASC ) 5 MG tablet [Pharmacy Med Name: amLODIPine  BESYLATE 5 MG TAB] 90 tablet 1    Sig: TAKE 1 TABLET BY MOUTH DAILY     Cardiovascular: Calcium  Channel Blockers 2 Failed - 12/25/2023  1:34 PM      Failed - Last BP in normal range    BP Readings from Last 1 Encounters:  12/07/23 (!) 149/87         Failed - Valid encounter within last 6 months    Recent Outpatient Visits   None     Future Appointments             In 1 week Sonya Cid, DO [place] [facility]   In 5 months Sonya Pressman, MD [facility] [place]            Passed - Last Heart Rate in normal range    Pulse Readings from Last 1 Encounters:  12/07/23 85          albuterol  (VENTOLIN  HFA) 108 (90 Base) MCG/ACT inhaler 1 each 2    Sig: Inhale 2 puffs into the lungs every 6 (six) hours as needed for wheezing or shortness of breath.     Pulmonology:  Beta Agonists 2 Failed - 12/25/2023  1:34 PM      Failed - Last BP in normal range    BP Readings from Last 1 Encounters:  12/07/23 (!) 149/87         Failed - Valid encounter within last 12 months    Recent Outpatient Visits   None     Future Appointments             In 1 week Sonya Cid, DO [place] [facility], [facility]   In 5 months Sonya Pressman, MD [facility] [place]            Passed - Last Heart Rate in normal range    Pulse Readings from Last 1 Encounters:  12/07/23 85

## 2023-12-25 NOTE — Telephone Encounter (Signed)
 Chief Complaint: SOB Symptoms: Mild SOB, runny nose, cough Frequency: Comes and goes  Pertinent Negatives: Patient denies fever, chest pain, wheezing  Disposition: [] ED /[] Urgent Care (no appt availability in office) / [] Appointment(In office/virtual)/ []  [facility]/ [x] Home Care/ [] Refused Recommended Disposition /[]  Mobile Bus/ []  Follow-up with PCP Additional Notes: Patient states she is out of her inhaler and needs a refill because has mild SOB that comes and goes but improves with the use of her inhaler. Patient states she was recently treated for a cough an runny nose as well. Care advice was given and home care advised given to patient. Patient verbalized understanding on when to callback if symptoms do not improve.   Copied from CRM [id number]. Topic: Clinical - Red Word Triage >> Dec 25, 2023  1:14 PM Carla L wrote: [facility] that prompted transfer to Nurse Triage: SOB, requesting ventolin  inhaler Reason for Disposition  [1] MILD longstanding difficulty breathing AND [2]  SAME as normal  Answer Assessment - Initial Assessment Questions 1. RESPIRATORY STATUS: "Describe your breathing?" (e.g., wheezing, shortness of breath, unable to speak, severe coughing)      SOB 2. ONSET: "When did this breathing problem begin?"      I'm not sure  3. PATTERN "Does the difficult breathing come and go, or has it been constant since it started?"      Come and go  4. SEVERITY: "How bad is your breathing?" (e.g., mild, moderate, severe)    - MILD: No SOB at rest, mild SOB with walking, speaks normally in sentences, can lie down, no retractions, pulse < 100.    - MODERATE: SOB at rest, SOB with minimal exertion and prefers to sit, cannot lie down flat, speaks in phrases, mild retractions, audible wheezing, pulse 100-120.    - SEVERE: Very SOB at rest, speaks in single words, struggling to breathe, sitting hunched forward, retractions, pulse > 120      Mild to moderate  5. RECURRENT  SYMPTOM: "Have you had difficulty breathing before?" If Yes, ask: "When was the last time?" and "What happened that time?"      Yes, when the season changes and I use my inhaler  6. CARDIAC HISTORY: "Do you have any history of heart disease?" (e.g., heart attack, angina, bypass surgery, angioplasty)      No  7. LUNG HISTORY: "Do you have any history of lung disease?"  (e.g., pulmonary embolus, asthma, emphysema)     Asthma  8. CAUSE: "What do you think is causing the breathing problem?"      Just being out of my inhaler  9. OTHER SYMPTOMS: "Do you have any other symptoms? (e.g., dizziness, runny nose, cough, chest pain, fever)     Runny nose, cough 10. O2 SATURATION MONITOR:  "Do you use an oxygen saturation monitor (pulse oximeter) at home?" If Yes, ask: "What is your reading (oxygen level) today?" "What is your usual oxygen saturation reading?" (e.g., 95%)       N/A 11. PREGNANCY: "Is there any chance you are pregnant?" "When was your last menstrual period?"       N/A  12. TRAVEL: "Have you traveled out of the country in the last month?" (e.g., travel history, exposures)       N/A  Protocols used: Breathing Difficulty-A-AH

## 2023-12-25 NOTE — Telephone Encounter (Signed)
 refilled

## 2023-12-26 ENCOUNTER — Ambulatory Visit: Admitting: Podiatry

## 2023-12-27 ENCOUNTER — Ambulatory Visit (INDEPENDENT_AMBULATORY_CARE_PROVIDER_SITE_OTHER): Admitting: Podiatry

## 2023-12-27 ENCOUNTER — Encounter: Payer: Self-pay | Admitting: Podiatry

## 2023-12-27 ENCOUNTER — Ambulatory Visit (INDEPENDENT_AMBULATORY_CARE_PROVIDER_SITE_OTHER)

## 2023-12-27 ENCOUNTER — Encounter: Payer: Self-pay | Admitting: Oncology

## 2023-12-27 DIAGNOSIS — M19071 Primary osteoarthritis, right ankle and foot: Secondary | ICD-10-CM

## 2023-12-27 DIAGNOSIS — L603 Nail dystrophy: Secondary | ICD-10-CM | POA: Diagnosis not present

## 2023-12-27 DIAGNOSIS — B351 Tinea unguium: Secondary | ICD-10-CM

## 2023-12-27 NOTE — Patient Instructions (Signed)
 VISIT SUMMARY:  Today, you were seen for right foot pain that has been persistent for about six months. We discussed your history of arthritis and concerns about your toenails. A radiograph confirmed severe arthritis in your right mid tarsal joint, and we also noted a bone spur. Additionally, we examined your toenails and decided to conduct a culture to check for any fungal infection.  YOUR PLAN:  -SEVERE END STAGE ARTHRITIS OF THE RIGHT MID TARSAL JOINT: Severe end stage arthritis in your right mid tarsal joint means that the joint has significant wear and tear, causing pain and limiting your activities. We recommend using ice for pain relief, taking acetaminophen , and applying diclofenac gel up to four times daily. Ensure your shoes are not tied tightly across the affected area. If the pain worsens, we may consider a cortisone injection. Surgery is a last resort due to its significant nature.  -BONE SPUR ON RIGHT FOOT DUE TO ARTHRITIS: A bone spur on your right foot is a bony growth that has developed due to arthritis, causing pain and discomfort, especially when shoes press on it. We will manage this with the same conservative measures as your arthritis, and consider a cortisone injection if needed.  -DYSTROPHIC TOENAIL ON LEFT FOOT: A dystrophic toenail is a toenail that has become thickened and possibly infected. We have taken a toenail clipping for a fungal culture to determine if there is an active fungal infection. We will await the culture results to decide if further antifungal treatment is necessary.  INSTRUCTIONS:  Please follow the recommendations for managing your arthritis and bone spur, including using ice, acetaminophen , and diclofenac gel. Ensure your shoes are not tied tightly across the affected area. If your pain worsens, contact us  to discuss the possibility of a cortisone injection. We will inform you of the results of your toenail culture and any further treatment needed.

## 2023-12-27 NOTE — Progress Notes (Unsigned)
 Subjective:  Patient ID: Sonya Harper, female    DOB: [birth year],  MRN: [id number]  Chief Complaint  Patient presents with   Foot Pain    Pt is here due to pain in right foot, no injury states pain has been there for about 4-6 months.    Discussed the use of AI scribe software for clinical note transcription with the patient, who gave verbal consent to proceed.  History of Present Illness Sonya Harper is a 78 year old female with arthritis who presents with right foot pain.  She has been experiencing mild, persistent pain on the side of her right foot for approximately six months. The pain does not radiate and she recalls a possible contributing factor of hitting her foot against something. She has a history of arthritis in her feet, diagnosed years ago when she applied for disability. No recent x-ray has been performed prior to this visit.  She expresses concerns about her toenails, mentioning a previous prescription for a minor issue with one or two toenails. She is unsure if there are current problems but desires to maintain foot health. No worsening of the toenail condition since the last visit. No pain in the toenails.  She is not currently taking any specific medication for her foot pain but is open to using topical treatments if necessary.      Objective:    Physical Exam VASCULAR: DP and PT pulses palpable. Foot is warm and well-perfused. Capillary fill time is brisk. DERMATOLOGIC: Normal skin turgor, texture, and temperature. No open lesions, rashes, or ulcerations. Dystrophic second toenail on the left foot. NEUROLOGIC: Normal sensation to light touch and pressure. No paresthesias on examination. ORTHOPEDIC: Smooth pain-free range of motion of all examined joints. No ecchymosis or bruising. No gross deformity. Mild pain on palpation of the right dorsal midfoot. Dorsal bone spurs on bilateral midfoot.   No images are attached to the encounter.     Results Procedure: Toenail Clipping Description: Clipping of the second toenail on the left foot to obtain a sample for fungal culture.  RADIOLOGY Right foot radiograph: Severe end-stage arthritis of the midtarsal joint, dorsal spurring at the TMT and [place] joints (12/27/2023)   Assessment:   1. Arthritis of right midfoot   2. Onychomycosis      Plan:  Patient was evaluated and treated and all questions answered.  Assessment and Plan Assessment & Plan Severe end stage arthritis of the right mid tarsal joint Severe end stage arthritis of the right mid tarsal joint confirmed by radiograph, causing significant pain and associated with a bone spur. The condition is chronic, limiting activities. Permanent surgical correction is significant and not commonly pursued. Initial management includes conservative measures, with cortisone injection considered if pain worsens. Surgery is a last resort due to its significant nature. - Advise use of ice for pain relief. - Recommend acetaminophen  for pain management. - Suggest use of diclofenac gel up to four times daily for topical anti-inflammatory effect. - Advise ensuring shoes are not tied tightly across the affected area. - Consider cortisone injection if pain worsens. - Discuss potential for significant surgery if conservative measures fail.  Bone spur on right foot due to arthritis Bone spur on the right foot secondary to arthritis, contributing to pain and discomfort, particularly when shoes press on the area. Result of joint degeneration and bone buildup. Conservative management is preferred, with cortisone injection as an option if needed. - Manage with conservative measures as outlined for arthritis. - Consider  cortisone injection if conservative measures are insufficient.  Dystrophic toenail on left foot Dystrophic second toenail on the left foot. Previous treatment with antifungal medication. Current examination shows thickening, and a  culture is needed to determine if active fungal infection persists. Awaiting culture results to guide further treatment. - Obtain toenail clipping for fungal culture. - Await culture results to determine need for further antifungal treatment.      Return if symptoms worsen or fail to improve.

## 2023-12-28 ENCOUNTER — Encounter: Payer: Self-pay | Admitting: Internal Medicine

## 2023-12-28 ENCOUNTER — Other Ambulatory Visit: Payer: Self-pay

## 2023-12-28 ENCOUNTER — Ambulatory Visit (INDEPENDENT_AMBULATORY_CARE_PROVIDER_SITE_OTHER): Admitting: Internal Medicine

## 2023-12-28 VITALS — BP 138/84 | HR 96 | Temp 97.9°F | Resp 18 | Ht 65.0 in | Wt 162.2 lb

## 2023-12-28 DIAGNOSIS — G4700 Insomnia, unspecified: Secondary | ICD-10-CM | POA: Diagnosis not present

## 2023-12-28 DIAGNOSIS — J329 Chronic sinusitis, unspecified: Secondary | ICD-10-CM

## 2023-12-28 DIAGNOSIS — J4530 Mild persistent asthma, uncomplicated: Secondary | ICD-10-CM | POA: Diagnosis not present

## 2023-12-28 DIAGNOSIS — I480 Paroxysmal atrial fibrillation: Secondary | ICD-10-CM

## 2023-12-28 DIAGNOSIS — R051 Acute cough: Secondary | ICD-10-CM | POA: Diagnosis not present

## 2023-12-28 DIAGNOSIS — K219 Gastro-esophageal reflux disease without esophagitis: Secondary | ICD-10-CM | POA: Diagnosis not present

## 2023-12-28 MED ORDER — TRAZODONE HCL 150 MG PO TABS
150.0000 mg | ORAL_TABLET | Freq: Every day | ORAL | 1 refills | Status: DC
Start: 2023-12-28 — End: 2024-06-19

## 2023-12-28 MED ORDER — MONTELUKAST SODIUM 10 MG PO TABS: 10.0000 mg | ORAL_TABLET | Freq: Every day | ORAL | 3 refills | Status: AC

## 2023-12-28 MED ORDER — APIXABAN 5 MG PO TABS: 5.0000 mg | ORAL_TABLET | Freq: Two times a day (BID) | ORAL | 3 refills | Status: AC

## 2023-12-28 MED ORDER — OMEPRAZOLE 20 MG PO CPDR: 20.0000 mg | DELAYED_RELEASE_CAPSULE | Freq: Every day | ORAL | 3 refills | Status: DC

## 2023-12-28 MED ORDER — METHYLPREDNISOLONE 4 MG PO TBPK: ORAL_TABLET | ORAL | 0 refills | Status: DC

## 2023-12-28 MED ORDER — FLUTICASONE FUROATE-VILANTEROL 200-25 MCG/ACT IN AEPB: 1.0000 | INHALATION_SPRAY | Freq: Every day | RESPIRATORY_TRACT | 11 refills | Status: AC

## 2023-12-28 MED ORDER — HYDROCOD POLI-CHLORPHE POLI ER 10-8 MG/5ML PO SUER: 5.0000 mL | Freq: Two times a day (BID) | ORAL | 0 refills | Status: AC | PRN

## 2023-12-28 MED ORDER — CETIRIZINE HCL 10 MG PO TABS
10.0000 mg | ORAL_TABLET | Freq: Every day | ORAL | 1 refills | Status: AC
Start: 2023-12-28 — End: ?

## 2023-12-28 NOTE — Progress Notes (Signed)
 Established Patient Office Visit  Subjective   Patient ID: Sonya Harper, female    DOB: 06-Sep-1945  Age: 78 y.o. MRN: [id number]  Chief Complaint  Patient presents with   Medical Management of Chronic Issues    6 month follow up   Sinusitis    Sinusitis Associated symptoms include congestion and coughing. Pertinent negatives include no chills or sore throat.    Patient is here for follow up on chronic medical conditions and for sinus symptoms.  Discussed the use of AI scribe software for clinical note transcription with the patient, who gave verbal consent to proceed.  History of Present Illness The patient, with a history of hypertension, presents with severe allergy symptoms that have been worsening over the past two weeks. She describes feeling extremely tired and experiencing choking sensations. Despite wearing a mask when outside, her symptoms have not improved. She has been taking montelukast  for her allergies, but it has not been effective. She has also tried over-the-counter cough medicine and cold and flu medication, but these have not alleviated her symptoms. The patient also mentions having chest pain and heart palpitations, which are being investigated by her cardiologist. She is also taking medication for her blood pressure and has concerns about the potential side effects of her reflux medication, Prilosec.  Hypertension/A.fib: -Medications: Amlodipine  5 mg, Losartan  50 mg, Eliquis  5 mg BID -Patient is compliant with above medications and reports no side effects. -Checking BP at home (average): Not checking  -Denies any acute vision changes, LE edema or symptoms of hypotension -Follows with Cardiology -Nuclear stress test in 2018 was unremarkable.  TEE from 3/22 unremarkable as well with a normal EF.   HLD: -Medications: Lipitor 10 mg every other day  -Patient is compliant with above medications and reports no side effects.  -Lipid Panel     Component Value  Date/Time   CHOL 141 01/03/2023 1121   TRIG 64 01/03/2023 1121   HDL 61 01/03/2023 1121   CHOLHDL 2.3 01/03/2023 1121   LDLCALC 66 01/03/2023 1121    Hx of Pre-Diabetes: -Last A1c 5.4% 10/24 -Not currently on any medication   Asthma:  -Asthma status: controlled -Current Treatments: Breo-Ellipta 200 daily, albuterol  PRN -Satisfied with current treatment?: yes -Albuterol /rescue inhaler frequency: Occasionally -Dyspnea frequency: Occasionally -Wheezing frequency: Occasionally, not recently -Cough frequency: Daily, nonproductive -Limitation of activity: no -Current upper respiratory symptoms: no -Visits to ER or Urgent Care in past year: no -Pneumovax: Prevnar 23 in 2018, 13 in 2016 - will do Prevnar 20 today -Influenza: Up to Date   GERD: -Currently on Protonix  40 mg but concerned about potential side effects -Had EGD 1/21, which did show gastritis, negative for dysplasia.     Hypothyroidism: -Medications: Levothyroxine  50 mcg  -Patient is compliant with the above medication (s) at the above dose and reports no medication side effects.  -Denies weight changes, cold./heat intolerance, skin changes, anxiety/palpitations  -Last TSH: 1.73, 8/24 -Follows with Endocrinology, note from 07/27/23 reviewed, seen on annual basis.    Insomnia:  -Currently on Trazodone  100 mg, wanting to increase dose    Health Maintenance: -Blood work UTD -Mammogram 9/24 Birads-2 -Colonoscopy 09/20/19 - repeat in 5 years  Patient Active Problem List   Diagnosis Date Noted   Heart murmur 08/01/2023   Iron  deficiency anemia 04/06/2023   Polyp of descending colon 04/06/2023   Status post radiofrequency ablation for arrhythmia 12/12/2022   Exposure to COVID-19 virus 09/23/2020   Hypothyroidism, adult 05/21/2020   Persistent asthma  with acute exacerbation 04/24/2020   Polyp of transverse colon    Polyp of ascending colon    Chronic anticoagulation 09/12/2019   Paroxysmal atrial fibrillation (HCC)  09/12/2019   AKI (acute kidney injury) (HCC) 06/26/2019   Grade I diastolic dysfunction 06/26/2019   Mild mitral regurgitation 06/26/2019   Mild tricuspid regurgitation 06/26/2019   Lumbar facet arthropathy 11/06/2018   Prediabetes 02/27/2018   Medication monitoring encounter 11/13/2017   Abnormal laboratory test 10/16/2017   Palpitations 08/23/2017   Breast pain in female 08/10/2017   Obesity (BMI 30.0-34.9) 08/10/2017   Protein in urine 08/10/2017   Vaginal atrophy 08/10/2017   Benign essential HTN 11/16/2016   Hyperlipidemia, mixed 11/16/2016   SOBOE (shortness of breath on exertion) 11/16/2016   Dysrhythmia 07/07/2016   Right low back pain 07/07/2016   Fall on or from stairs or steps 07/07/2016   Family history of cancer    Benign neoplasm of ascending colon    Benign neoplasm of transverse colon    Benign neoplasm of descending colon    Goiter 03/03/2015   Asthma, mild persistent 03/03/2015   Gastroesophageal reflux disease without esophagitis 03/03/2015   Past Medical History:  Diagnosis Date   A-fib [facility])    Allergy    Arthritis    feet   Asthma    Breast neoplasm    Chronic lower back pain    s/p fall injury   Depression    Dysrhythmia    Fibromyalgia    Hypertension    Hypothyroidism    Lumbar facet arthropathy 11/06/2018   Prediabetes 02/27/2018   Sciatica of right side    Wears dentures    partial upper and lower   Past Surgical History:  Procedure Laterality Date   ABDOMINAL HYSTERECTOMY     BIOPSY  04/06/2023   Procedure: BIOPSY;  Surgeon: Marnee Sink, MD;  Location: [facility] ENDOSCOPY;  Service: Endoscopy;;   CATARACT EXTRACTION W/PHACO Right 02/02/2022   Procedure: CATARACT EXTRACTION PHACO AND INTRAOCULAR LENS PLACEMENT (IOC) RIGHT;  Surgeon: Annell Kidney, MD;  Location: [facility];  Service: Ophthalmology;  Laterality: Right;  4.52 00:45.9   CATARACT EXTRACTION W/PHACO Left 02/16/2022   Procedure: CATARACT EXTRACTION PHACO AND  INTRAOCULAR LENS PLACEMENT (IOC) LEFT 7.05 01:19.8;  Surgeon: Annell Kidney, MD;  Location: [facility];  Service: Ophthalmology;  Laterality: Left;   COLONOSCOPY WITH PROPOFOL  N/A 05/13/2016   Procedure: COLONOSCOPY WITH PROPOFOL ;  Surgeon: Marnee Sink, MD;  Location: [facility];  Service: Endoscopy;  Laterality: N/A;   COLONOSCOPY WITH PROPOFOL  N/A 09/20/2019   Procedure: COLONOSCOPY WITH PROPOFOL ;  Surgeon: Marnee Sink, MD;  Location: [facility] END[facility];  Service: Endoscopy;  Laterality: N/A;   COLONOSCOPY WITH PROPOFOL  N/A 04/06/2023   Procedure: COLONOSCOPY WITH PROPOFOL ;  Surgeon: Marnee Sink, MD;  Location: [facility];  Service: Endoscopy;  Laterality: N/A;   ELECTROPHYSIOLOGIC STUDY     Ablation   ESOPHAGOGASTRODUODENOSCOPY (EGD) WITH PROPOFOL  N/A 09/20/2019   Procedure: ESOPHAGOGASTRODUODENOSCOPY (EGD) WITH PROPOFOL ;  Surgeon: Marnee Sink, MD;  Location: [facility];  Service: Endoscopy;  Laterality: N/A;   ESOPHAGOGASTRODUODENOSCOPY (EGD) WITH PROPOFOL  N/A 04/06/2023   Procedure: ESOPHAGOGASTRODUODENOSCOPY (EGD) WITH PROPOFOL ;  Surgeon: Marnee Sink, MD;  Location: [facility];  Service: Endoscopy;  Laterality: N/A;   GIVENS CAPSULE STUDY N/A 05/02/2023   Procedure: GIVENS CAPSULE STUDY;  Surgeon: Marnee Sink, MD;  Location: [facility];  Service: Endoscopy;  Laterality: N/A;   POLYPECTOMY N/A 05/13/2016   Procedure: POLYPECTOMY;  Surgeon: Marnee Sink, MD;  Location: [facility];  Service: Endoscopy;  Laterality: N/A;   POLYPECTOMY  04/06/2023   Procedure: POLYPECTOMY;  Surgeon: Marnee Sink, MD;  Location: [facility];  Service: Endoscopy;;   Social History   Tobacco Use   Smoking status: Never    Passive exposure: Never   Smokeless tobacco: Never  Vaping Use   Vaping status: Never Used  Substance Use Topics   Alcohol use: No    Alcohol/week: 0.0 standard drinks of alcohol   Drug use: Never   Social History   Socioeconomic History    Marital status: Married    Spouse name: Not on file   Number of children: Not on file   Years of education: Not on file   Highest education level: Not on file  Occupational History   Not on file  Tobacco Use   Smoking status: Never    Passive exposure: Never   Smokeless tobacco: Never  Vaping Use   Vaping status: Never Used  Substance and Sexual Activity   Alcohol use: No    Alcohol/week: 0.0 standard drinks of alcohol   Drug use: Never   Sexual activity: Not Currently    Birth control/protection: Post-menopausal  Other Topics Concern   Not on file  Social History Narrative   Not on file   Social Drivers of Health   Financial Resource Strain: Low Risk  (09/18/2023)   Received from [facility]   Overall Financial Resource Strain (CARDIA)    Difficulty of Paying Living Expenses: Not hard at all  Food Insecurity: No Food Insecurity (09/18/2023)   Received from [facility]   Hunger Vital Sign    Worried About Running Out of Food in the Last Year: Never true    Ran Out of Food in the Last Year: Never true  Transportation Needs: No Transportation Needs (09/18/2023)   Received [facility] - [facility]    In the past 12 months, has lack of transportation kept you from medical appointments or from getting medications?: No    Lack of Transportation (Non-Medical): No  Physical Activity: Not on file  Stress: Not on file  Social Connections: Not on file  Intimate Partner Violence: Not At Risk (05/05/2023)   Humiliation, Afraid, Rape, and Kick questionnaire    Fear of Current or Ex-Partner: No    Emotionally Abused: No    Physically Abused: No    Sexually Abused: No   Family Status  Relation Name Status   Mother  Deceased   Father  Deceased   Sister  (Not Specified)   Sister  Deceased   Oceanographer  (Not Specified)   Daughter  Alive  No partnership data on file   Family History  Problem Relation Age of  Onset   Stroke Mother    Breast cancer Sister 61       breast ca x3   Cancer Sister    Colon cancer Paternal Aunt    Breast cancer Daughter    Allergies  Allergen Reactions   Molds & Smuts Anaphylaxis   Penicillins Nausea And Vomiting and Rash    Has patient had a PCN reaction causing immediate rash, facial/tongue/throat swelling, SOB or lightheadedness with hypotension: Yes Has patient had a PCN reaction causing severe rash involving mucus membranes or skin necrosis: No Has patient had a PCN reaction that required hospitalization: No Has patient had a PCN reaction occurring within the last 10 years: No If all  of the above answers are "NO", then may proceed with Cephalosporin use.       Review of Systems  Constitutional:  Negative for chills and fever.  HENT:  Positive for congestion and sinus pain. Negative for sore throat.   Respiratory:  Positive for cough.       Objective:     BP 138/84 (Cuff Size: Large)   Pulse 96   Temp 97.9 F (36.6 C) (Oral)   Resp 18   Ht 5\' 5"  (1.651 m)   Wt 162 lb 3.2 oz (73.6 kg)   SpO2 93%   BMI 26.99 kg/m  BP Readings from Last 3 Encounters:  12/28/23 138/84  12/07/23 (!) 149/87  08/24/23 (!) 147/61   Wt Readings from Last 3 Encounters:  12/28/23 162 lb 3.2 oz (73.6 kg)  12/27/23 159 lb 8 oz (72.3 kg)  12/07/23 159 lb 8 oz (72.3 kg)      Physical Exam Constitutional:      Appearance: Normal appearance.  HENT:     Head: Normocephalic and atraumatic.     Right Ear: Ear canal and external ear normal.     Left Ear: Tympanic membrane, ear canal and external ear normal.     Ears:     Comments: Right eustachian tube dysfunction     Nose: Nose normal.     Mouth/Throat:     Mouth: Mucous membranes are moist.     Pharynx: Oropharynx is clear.  Eyes:     Conjunctiva/sclera: Conjunctivae normal.  Cardiovascular:     Rate and Rhythm: Normal rate and regular rhythm.  Pulmonary:     Effort: Pulmonary effort is normal.     Breath  sounds: Normal breath sounds.  Skin:    General: Skin is warm and dry.  Neurological:     General: No focal deficit present.     Mental Status: She is alert. Mental status is at baseline.  Psychiatric:        Mood and Affect: Mood normal.        Behavior: Behavior normal.      No results found for any visits on 12/28/23.  Last CBC Lab Results  Component Value Date   WBC 5.3 12/04/2023   HGB 13.4 12/04/2023   HCT 41.1 12/04/2023   MCV 89.2 12/04/2023   MCH 29.1 12/04/2023   RDW 13.9 12/04/2023   PLT 200 12/04/2023   Last metabolic panel Lab Results  Component Value Date   GLUCOSE 98 08/24/2023   NA 136 08/24/2023   K 3.6 08/24/2023   CL 103 08/24/2023   CO2 23 08/24/2023   BUN 12 08/24/2023   CREATININE 0.95 08/24/2023   GFRNONAA >60 08/24/2023   CALCIUM  8.3 (L) 08/24/2023   PHOS 3.5 02/26/2018   PROT 6.8 05/14/2023   ALBUMIN 3.7 05/14/2023   BILITOT 0.7 05/14/2023   ALKPHOS 86 05/14/2023   AST 19 05/14/2023   ALT 15 05/14/2023   ANIONGAP 10 08/24/2023   Last lipids Lab Results  Component Value Date   CHOL 141 01/03/2023   HDL 61 01/03/2023   LDLCALC 66 01/03/2023   TRIG 64 01/03/2023   CHOLHDL 2.3 01/03/2023   Last hemoglobin A1c Lab Results  Component Value Date   HGBA1C 5.4 07/05/2023   Last thyroid  functions Lab Results  Component Value Date   TSH 1.73 04/26/2023   Last vitamin D  Lab Results  Component Value Date   VD25OH 49 04/26/2023   Last vitamin B12 and Folate Lab  Results  Component Value Date   VITAMINB12 412 04/26/2023      The 10-year ASCVD risk score (Arnett DK, et al., 2019) is: 13.7%    Assessment & Plan:   Assessment & Plan Sinusitis Sinusitis likely due to seasonal allergies causing congestion and drainage issues. - Prescribed Medrol  Dosepak for inflammation. - Prescribed codeine  cough syrup for 5 days for cough relief and sleep aid.  Seasonal Allergic Rhinitis Suspected to exacerbate sinusitis symptoms.  Montelukast  treatment ongoing. - Switch from Claritin  to Zyrtec  for better control. - Continue montelukast .  Atrial Fibrillation - Following with Cardiology - Refill Eliquis    Gastroesophageal Reflux Disease (GERD) GERD managed with Prilosec. Discussed long-term use concerns and side effects. - Decrease Prilosec to 20 mg daily with goal of discontinuation if possible. - Educated on long-term acid suppressant side effects.  Insomnia Managed with trazodone  100 mg. Reports inadequate sleep. - Increase trazodone  to 150 mg to improve sleep.  - cetirizine  (ZYRTEC  ALLERGY) 10 MG tablet; Take 1 tablet (10 mg total) by mouth daily.  Dispense: 90 tablet; Refill: 1 - montelukast  (SINGULAIR ) 10 MG tablet; Take 1 tablet (10 mg total) by mouth at bedtime.  Dispense: 90 tablet; Refill: 3 - methylPREDNISolone  (MEDROL  DOSEPAK) 4 MG TBPK tablet; Use as directed.  Dispense: 21 each; Refill: 0 - chlorpheniramine-HYDROcodone  (TUSSIONEX) 10-8 MG/5ML; Take 5 mLs by mouth every 12 (twelve) hours as needed for up to 5 days for cough.  Dispense: 50 mL; Refill: 0 - apixaban  (ELIQUIS ) 5 MG TABS tablet; Take 1 tablet (5 mg total) by mouth 2 (two) times daily.  Dispense: 180 tablet; Refill: 3 - fluticasone  furoate-vilanterol (BREO ELLIPTA ) 200-25 MCG/ACT AEPB; Inhale 1 puff into the lungs daily.  Dispense: 1 each; Refill: 11 - omeprazole  (PRILOSEC) 20 MG capsule; Take 1 capsule (20 mg total) by mouth daily.  Dispense: 30 capsule; Refill: 3 - traZODone  (DESYREL ) 150 MG tablet; Take 1 tablet (150 mg total) by mouth at bedtime.  Dispense: 90 tablet; Refill: 1   Return in about 6 months (around 06/28/2024).    Rockney Cid, DO

## 2023-12-30 ENCOUNTER — Other Ambulatory Visit: Payer: Self-pay | Admitting: Internal Medicine

## 2023-12-30 DIAGNOSIS — K219 Gastro-esophageal reflux disease without esophagitis: Secondary | ICD-10-CM

## 2024-01-01 NOTE — Telephone Encounter (Signed)
 Unable to refill per protocol, Rx expired. Discontinued 12/28/23, dose change.  Requested Prescriptions  Pending Prescriptions Disp Refills   omeprazole  (PRILOSEC) 40 MG capsule [Pharmacy Med Name: OMEPRAZOLE  DR 40 MG CAPSULE] 180 capsule 1    Sig: TAKE 1 CAPSULE BY MOUTH 2 TIMES A DAY     Gastroenterology: Proton Pump Inhibitors Failed - 01/01/2024 11:28 AM      Failed - Valid encounter within last 12 months    Recent Outpatient Visits           4 days ago Rhinosinusitis   [facility] Rockney Cid, DO       Future Appointments             In 5 months Estanislao Heimlich, Dennard Fisher, MD [facility] [place]   In 5 months Rockney Cid, DO [facility]

## 2024-01-03 ENCOUNTER — Ambulatory Visit: Payer: Self-pay | Admitting: Internal Medicine

## 2024-01-11 ENCOUNTER — Encounter: Payer: Self-pay | Admitting: Dermatology

## 2024-01-11 ENCOUNTER — Ambulatory Visit: Admitting: Dermatology

## 2024-01-11 DIAGNOSIS — L821 Other seborrheic keratosis: Secondary | ICD-10-CM | POA: Diagnosis not present

## 2024-01-11 DIAGNOSIS — L720 Epidermal cyst: Secondary | ICD-10-CM

## 2024-01-11 DIAGNOSIS — L209 Atopic dermatitis, unspecified: Secondary | ICD-10-CM

## 2024-01-11 DIAGNOSIS — L700 Acne vulgaris: Secondary | ICD-10-CM | POA: Diagnosis not present

## 2024-01-11 MED ORDER — CLOBETASOL PROPIONATE 0.05 % EX CREA: TOPICAL_CREAM | CUTANEOUS | 2 refills | Status: AC

## 2024-01-11 NOTE — Patient Instructions (Addendum)
 Start Clobetasol cream twice a day to affected areas on legs until resolved then as needed. Avoid applying to face, groin, and axilla. Use as directed. Long-term use can cause thinning of the skin.  Topical steroids (such as triamcinolone, fluocinolone, fluocinonide, mometasone , clobetasol, halobetasol, betamethasone, hydrocortisone) can cause thinning and lightening of the skin if they are used for too long in the same area. Your physician has selected the right strength medicine for your problem and area affected on the body. Please use your medication only as directed by your physician to prevent side effects.    Recommend  Valaria Garland, M.D. [address] [place], [place] [address] [phone]  [email or web address]    Recommend using over the counter acne medication. Apply at bedtime, wash off in morning.   Effaclar: start out 3 nights a week, gradually increase as tolerated.  Recommend daily broad spectrum sunscreen SPF 30+ to sun-exposed areas, reapply every 2 hours as needed. Call for new or changing lesions.  Staying in the shade or wearing long sleeves, sun glasses (UVA+UVB protection) and wide brim hats (4-inch brim around the entire circumference of the hat) are also recommended for sun protection.    Topical retinoid medications like tretinoin/Retin-A, adapalene/Differin, tazarotene/Fabior, and Epiduo/Epiduo Forte can cause dryness and irritation when first started. Only apply a pea-sized amount to the entire affected area. Avoid applying it around the eyes, edges of mouth and creases at the nose. If you experience irritation, use a good moisturizer first and/or apply the medicine less often. If you are doing well with the medicine, you can increase how often you use it until you are applying every night. Be careful with sun protection while using this medication as it can make you sensitive to the sun. This medicine should not be used by pregnant women.       Due to recent changes in healthcare laws, you may see results of your pathology and/or laboratory studies on MyChart before the doctors have had a chance to review them. We understand that in some cases there may be results that are confusing or concerning to you. Please understand that not all results are received at the same time and often the doctors may need to interpret multiple results in order to provide you with the best plan of care or course of treatment. Therefore, we ask that you please give us  2 business days to thoroughly review all your results before contacting the office for clarification. Should we see a critical lab result, you will be contacted sooner.   If You Need Anything After Your Visit  If you have any questions or concerns for your doctor, please call our main line at [phone] and press option 4 to reach your doctor's medical assistant. If no one answers, please leave a voicemail as directed and we will return your call as soon as possible. Messages left after 4 pm will be answered the following business day.   You may also send us  a message via MyChart. We typically respond to MyChart messages within 1-2 business days.  For prescription refills, please ask your pharmacy to contact our office. Our fax number is [phone].  If you have an urgent issue when the clinic is closed that cannot wait until the next business day, you can page your doctor at the number below.    Please note that while we do our best to be available for urgent issues outside of office hours, we are not available 24/7.  If you have an urgent issue and are unable to reach us , you may choose to seek medical care at your doctor's office, retail clinic, urgent care center, or emergency room.  If you have a medical emergency, please immediately call 911 or go to the emergency department.  Pager Numbers  - Dr. Bary Likes: [phone]  - Dr. Annette Barters: [phone]  - Dr. Felipe Horton:  [phone]   In the event of inclement weather, please call our main line at [phone] for an update on the status of any delays or closures.  Dermatology Medication Tips: Please keep the boxes that topical medications come in in order to help keep track of the instructions about where and how to use these. Pharmacies typically print the medication instructions only on the boxes and not directly on the medication tubes.   If your medication is too expensive, please contact our office at [phone] option 4 or send us  a message through MyChart.   We are unable to tell what your co-pay for medications will be in advance as this is different depending on your insurance coverage. However, we may be able to find a substitute medication at lower cost or fill out paperwork to get insurance to cover a needed medication.   If a prior authorization is required to get your medication covered by your insurance company, please allow us  1-2 business days to complete this process.  Drug prices often vary depending on where the prescription is filled and some pharmacies may offer cheaper prices.  The website www.goodrx.com contains coupons for medications through different pharmacies. The prices here do not account for what the cost may be with help from insurance (it may be cheaper with your insurance), but the website can give you the price if you did not use any insurance.  - You can print the associated coupon and take it with your prescription to the pharmacy.  - You may also stop by our office during regular business hours and pick up a GoodRx coupon card.  - If you need your prescription sent electronically to a different pharmacy, notify our office through [facility] or by phone at [phone] option 4.     Si Usted Necesita Algo Despus de Su Visita  Tambin puede enviarnos un mensaje a travs de Clinical cytogeneticist. Por lo general respondemos a los mensajes de MyChart en el transcurso de 1 a  2 das hbiles.  Para renovar recetas, por favor pida a su farmacia que se ponga en contacto con nuestra oficina. Franz Jacks de fax es [place] [phone].  Si tiene un asunto urgente cuando la clnica est cerrada y que no puede esperar hasta el siguiente da hbil, puede llamar/localizar a su doctor(a) al nmero que aparece a continuacin.   Por favor, tenga en cuenta que aunque hacemos todo lo posible para estar disponibles para asuntos urgentes fuera del horario de [place], no estamos disponibles las 24 horas del da, los 7 [address]  [address] de la [place].   Si tiene un problema urgente y no puede comunicarse con nosotros, puede optar por buscar atencin mdica  en el consultorio de su doctor(a), en una clnica privada, en un centro de atencin urgente o en una sala de emergencias.  Si tiene Engineer, drilling, por favor llame inmediatamente al 911 o vaya a la sala de emergencias.  Nmeros de bper  - Dr. Bary Likes: [phone]  - Dra. Annette Barters: [phone]  - Dr. Felipe Horton: [phone]   En caso de inclemencias del tiempo, por favor llame a nuestra  lnea principal al [phone] para una actualizacin sobre el [place] de cualquier retraso o cierre.  Consejos para la medicacin en dermatologa: Por favor, guarde las cajas en las que vienen los medicamentos de uso tpico para ayudarle a seguir las instrucciones sobre dnde y cmo usarlos. Las farmacias generalmente imprimen las instrucciones del medicamento slo en las cajas y no directamente en los tubos del [place].   Si su medicamento es muy caro, por favor, pngase en contacto con Bettyjane Brunet llamando al [phone] y presione la opcin 4 o envenos un mensaje a travs de Clinical cytogeneticist.   No podemos decirle cul ser su copago por los medicamentos por adelantado ya que esto es diferente dependiendo de la cobertura de su seguro. Sin embargo, es posible que podamos encontrar un medicamento sustituto a Audiological scientist un formulario para que  el seguro cubra el medicamento que se considera necesario.   Si se requiere una autorizacin previa para que su compaa de seguros [place] su medicamento, por favor permtanos de 1 a 2 das hbiles para completar este proceso.  Los precios de los medicamentos varan con frecuencia dependiendo del Environmental consultant de dnde se surte la receta y alguna farmacias pueden ofrecer precios ms baratos.  El sitio web www.goodrx.com tiene cupones para medicamentos de Health and safety inspector. Los precios aqu no tienen en cuenta lo que podra costar con la ayuda del seguro (puede ser ms barato con su seguro), pero el sitio web puede darle el precio si no utiliz Tourist information centre manager.  - Puede imprimir el cupn correspondiente y llevarlo con su receta a la farmacia.  - Tambin puede pasar por nuestra oficina durante el horario de atencin regular y Education officer, museum una tarjeta de cupones de GoodRx.  - Si necesita que su receta se enve electrnicamente a una farmacia diferente, informe a nuestra oficina a travs de MyChart de [place] o por telfono llamando al [phone] y presione la opcin 4.

## 2024-01-11 NOTE — Progress Notes (Signed)
 Follow-Up Visit   Subjective  Sonya Harper is a 78 y.o. female who presents for the following: Spots on legs. Itches, burns at times. Dur: few years. Dr. Bary Likes diagnosed as eczema and prescribed Mometasone  cream. Helped a little. These lesions come and go.   Check spots on face. Increasing in number. Itching and burning at times.   The patient has spots, moles and lesions to be evaluated, some may be new or changing and the patient may have concern these could be cancer.    The following portions of the chart were reviewed this encounter and updated as appropriate: medications, allergies, medical history  Review of Systems:  No other skin or systemic complaints except as noted in HPI or Assessment and Plan.  Objective  Well appearing patient in no apparent distress; mood and affect are within normal limits.  A focused examination was performed of the following areas: Face, legs  Relevant physical exam findings are noted in the Assessment and Plan.               Assessment & Plan   Atopic Dermatitis, flaring, resistant to mometasone  Ddx includes porokeratosis  Exam: Scaly erythematous circular plaques scattered on lower legs  Atopic dermatitis (eczema) is a chronic, relapsing, pruritic condition that can significantly affect quality of life. It is often associated with allergic rhinitis and/or asthma and can require treatment with topical medications, phototherapy, or in severe cases biologic injectable medication (Dupixent; Adbry) or Oral JAK inhibitors.  Treatment Plan: Start Clobetasol cream twice a day to affected areas on legs until resolved then as needed. Avoid applying to face, groin, and axilla. Use as directed. Long-term use can cause thinning of the skin. RTC if not improved after 2 weeks of clobetasol  Topical steroids (such as triamcinolone, fluocinolone, fluocinonide, mometasone , clobetasol, halobetasol, betamethasone, hydrocortisone) can cause  thinning and lightening of the skin if they are used for too long in the same area. Your physician has selected the right strength medicine for your problem and area affected on the body. Please use your medication only as directed by your physician to prevent side effects.    SEBORRHEIC KERATOSIS - Stuck-on, waxy, tan-brown papules at B/L temples/forehead - Benign-appearing - Discussed benign etiology and prognosis. - Observe - Call for any changes  Open Comedones and milia  Exam: open comedones and milia at B/L temples  Milia - tiny firm white papules - type of cyst - benign - may be extracted if symptomatic - observe Treatment:  Recommend using OTC adapalene twice weekly and increase as tolerated by one day per week. Use 1/3 fingertip amount  Topical retinoid medications like tretinoin/Retin-A, adapalene/Differin, tazarotene/Fabior, and Epiduo/Epiduo Forte can cause dryness and irritation when first started. Only apply a pea-sized amount to the entire affected area. Avoid applying it around the eyes, edges of mouth and creases at the nose. If you experience irritation, use a good moisturizer first and/or apply the medicine less often. If you are doing well with the medicine, you can increase how often you use it until you are applying every night. Be careful with sun protection while using this medication as it can make you sensitive to the sun. This medicine should not be used by pregnant women.   ATOPIC DERMATITIS, UNSPECIFIED TYPE   SEBORRHEIC KERATOSES   OPEN COMEDONE   MILIA     Return if symptoms worsen or fail to improve.  I, Jill Parcell, CMA, am acting as scribe for Harris Liming, MD.  Documentation: I have reviewed the above documentation for accuracy and completeness, and I agree with the above.  Harris Liming, MD

## 2024-01-21 ENCOUNTER — Ambulatory Visit: Payer: Self-pay | Admitting: Podiatry

## 2024-02-01 ENCOUNTER — Other Ambulatory Visit: Payer: Medicare HMO

## 2024-02-02 ENCOUNTER — Other Ambulatory Visit: Payer: Self-pay | Admitting: Internal Medicine

## 2024-02-02 DIAGNOSIS — E782 Mixed hyperlipidemia: Secondary | ICD-10-CM

## 2024-02-05 ENCOUNTER — Ambulatory Visit: Payer: Medicare HMO

## 2024-02-05 ENCOUNTER — Ambulatory Visit: Payer: Medicare HMO | Admitting: Oncology

## 2024-02-05 ENCOUNTER — Telehealth: Payer: Self-pay | Admitting: Internal Medicine

## 2024-02-05 NOTE — Telephone Encounter (Signed)
 Sent to pharmacy on 6/2

## 2024-02-05 NOTE — Telephone Encounter (Signed)
 Requested medications are due for refill today.  yes  Requested medications are on the active medications list.  yes  Last refill. 11/09/2023 #45 0 rf  Future visit scheduled.   no  Notes to clinic.  Labs are expired.    Requested Prescriptions  Pending Prescriptions Disp Refills   atorvastatin  (LIPITOR) 10 MG tablet [Pharmacy Med Name: ATORVASTATIN  10 MG TABLET] 45 tablet 0    Sig: TAKE 1 TABLET BY MOUTH EVERY OTHER DAY     Cardiovascular:  Antilipid - Statins Failed - 02/05/2024 10:27 AM      Failed - Valid encounter within last 12 months    Recent Outpatient Visits           1 month ago Rhinosinusitis   [facility] Rockney Cid, DO       Future Appointments             In 3 months Estanislao Heimlich Dennard Fisher, MD [facility] [place]   In 4 months Rockney Cid, DO [facility]            Failed - Lipid Panel in normal range within the last 12 months    Cholesterol  Date Value Ref Range Status  01/03/2023 141 <200 mg/dL Final   LDL Cholesterol (Calc)  Date Value Ref Range Status  01/03/2023 66 mg/dL (calc) Final    Comment:    Reference range: <100 . Desirable range <100 mg/dL for primary prevention;   <70 mg/dL for patients with CHD or diabetic patients  with > or = 2 CHD risk factors. Aaron Aas LDL-C is now calculated using the Martin-Hopkins  calculation, which is a validated novel method providing  better accuracy than the Friedewald equation in the  estimation of LDL-C.  Melinda Sprawls et al. Erroll Heard. 9528;413(24): 2061-2068  (http://education.[employer].com/faq/FAQ164)    HDL  Date Value Ref Range Status  01/03/2023 61 > OR = 50 mg/dL Final   Triglycerides  Date Value Ref Range Status  01/03/2023 64 <150 mg/dL Final         Passed - Patient is not pregnant

## 2024-02-05 NOTE — Telephone Encounter (Signed)
atorvastatin (LIPITOR) 10 MG tablet  ° °

## 2024-02-23 ENCOUNTER — Ambulatory Visit: Admitting: Internal Medicine

## 2024-02-28 ENCOUNTER — Ambulatory Visit: Admitting: Dermatology

## 2024-03-03 ENCOUNTER — Other Ambulatory Visit: Payer: Self-pay | Admitting: Internal Medicine

## 2024-03-03 DIAGNOSIS — I10 Essential (primary) hypertension: Secondary | ICD-10-CM

## 2024-03-05 NOTE — Telephone Encounter (Signed)
 Requested medication (s) are due for refill today: yes  Requested medication (s) are on the active medication list: yes  Last refill:  12/06/23 #90  Future visit scheduled: yes  Notes to clinic:  overdue lab work and appt   Requested Prescriptions  Pending Prescriptions Disp Refills   losartan  (COZAAR ) 100 MG tablet [Pharmacy Med Name: LOSARTAN  POTASSIUM 100 MG TAB] 90 tablet 0    Sig: TAKE 1 TABLET BY MOUTH DAILY     Cardiovascular:  Angiotensin Receptor Blockers Failed - 03/05/2024 10:29 AM      Failed - Cr in normal range and within 180 days    Creat  Date Value Ref Range Status  04/26/2023 0.94 0.60 - 1.00 mg/dL Final   Creatinine, Ser  Date Value Ref Range Status  08/24/2023 0.95 0.44 - 1.00 mg/dL Final   Creatinine, Urine  Date Value Ref Range Status  10/13/2017 184 20 - 275 mg/dL Final         Failed - K in normal range and within 180 days    Potassium  Date Value Ref Range Status  08/24/2023 3.6 3.5 - 5.1 mmol/L Final  06/03/2012 3.9 3.5 - 5.1 mmol/L Final         Failed - Valid encounter within last 6 months    Recent Outpatient Visits           2 months ago Rhinosinusitis   [facility] Bernardo Fend, DO       Future Appointments             In 2 months Francisca Redell BROCKS, MD [facility] - Patient is not pregnant      Passed - Last BP in normal range    BP Readings from Last 1 Encounters:  12/28/23 138/84

## 2024-03-07 ENCOUNTER — Encounter: Payer: Self-pay | Admitting: Internal Medicine

## 2024-03-07 ENCOUNTER — Ambulatory Visit (INDEPENDENT_AMBULATORY_CARE_PROVIDER_SITE_OTHER): Admitting: Internal Medicine

## 2024-03-07 ENCOUNTER — Other Ambulatory Visit: Payer: Self-pay

## 2024-03-07 VITALS — BP 130/80 | HR 85 | Temp 98.1°F | Resp 16 | Ht 65.0 in | Wt 160.4 lb

## 2024-03-07 DIAGNOSIS — R053 Chronic cough: Secondary | ICD-10-CM | POA: Diagnosis not present

## 2024-03-07 MED ORDER — METHYLPREDNISOLONE 4 MG PO TBPK: ORAL_TABLET | ORAL | 0 refills | Status: DC

## 2024-03-07 NOTE — Progress Notes (Signed)
 Acute Office Visit  Subjective:     Patient ID: Sonya Harper, female    DOB: April 18, 1946, 78 y.o.   MRN: [id number]  Chief Complaint  Patient presents with   Cough   Fatigue    HPI Patient is in today for cough and fatigue.   Discussed the use of AI scribe software for clinical note transcription with the patient, who gave verbal consent to proceed.  History of Present Illness Sonya Harper is a 78 year old female with asthma who presents with exacerbation of nighttime cough.  She experiences a persistent nighttime cough that worsens when lying down. Despite using allergy medication and inhalers, the cough persists and worsens. A Medrol  Dosepak used three months ago was effective, but over-the-counter cough suppressants have not helped. She is concerned about chest discomfort from excessive coughing. Her ears feel full but are not painful. No wheezing or ear pain. Current medications include allergy pills and inhalers, with past use of Medrol  Dosepak for asthma exacerbations.    Review of Systems  Constitutional:  Negative for chills and fever.  Respiratory:  Positive for cough. Negative for sputum production and shortness of breath.         Objective:    BP 130/80 (Cuff Size: Large)   Pulse 85   Temp 98.1 F (36.7 C) (Oral)   Resp 16   Ht 5' 5 (1.651 m)   Wt 160 lb 6.4 oz (72.8 kg)   SpO2 96%   BMI 26.69 kg/m  BP Readings from Last 3 Encounters:  03/07/24 130/80  12/28/23 138/84  12/07/23 (!) 149/87   Wt Readings from Last 3 Encounters:  03/07/24 160 lb 6.4 oz (72.8 kg)  12/28/23 162 lb 3.2 oz (73.6 kg)  12/27/23 159 lb 8 oz (72.3 kg)      Physical Exam Constitutional:      Appearance: Normal appearance.  HENT:     Head: Normocephalic and atraumatic.     Right Ear: Tympanic membrane, ear canal and external ear normal.     Left Ear: Tympanic membrane, ear canal and external ear normal.     Nose: Nose normal.     Mouth/Throat:     Mouth:  Mucous membranes are moist.     Pharynx: Oropharynx is clear.  Eyes:     Conjunctiva/sclera: Conjunctivae normal.  Cardiovascular:     Rate and Rhythm: Normal rate and regular rhythm.  Pulmonary:     Effort: Pulmonary effort is normal.     Breath sounds: Normal breath sounds. No wheezing, rhonchi or rales.  Skin:    General: Skin is warm and dry.  Neurological:     General: No focal deficit present.     Mental Status: She is alert. Mental status is at baseline.  Psychiatric:        Mood and Affect: Mood normal.        Behavior: Behavior normal.     No results found for any visits on 03/07/24.      Assessment & Plan:   Assessment & Plan Chronic Cough Nighttime cough. Persistent symptoms despite current treatment. No pneumonia or wheezing observed. - Prescribe Medrol  Dosepak. - Continue albuterol  inhaler. - Continue allergy medications.  Ear fullness Fullness in ears likely due to sinus pressure changes. - Prescribe Medrol  Dosepak.  - methylPREDNISolone  (MEDROL  DOSEPAK) 4 MG TBPK tablet; Use as directed.  Dispense: 21 each; Refill: 0   Return in about 3 months (around 06/07/2024).  Sharyle Fischer, DO

## 2024-03-09 ENCOUNTER — Other Ambulatory Visit: Payer: Self-pay | Admitting: Internal Medicine

## 2024-03-09 DIAGNOSIS — I10 Essential (primary) hypertension: Secondary | ICD-10-CM

## 2024-03-11 ENCOUNTER — Other Ambulatory Visit: Payer: Self-pay | Admitting: Internal Medicine

## 2024-03-12 NOTE — Telephone Encounter (Signed)
 Pharmacy sent another request for losartan  100

## 2024-03-12 NOTE — Telephone Encounter (Signed)
 Requested Prescriptions  Pending Prescriptions Disp Refills   losartan  (COZAAR ) 100 MG tablet [Pharmacy Med Name: LOSARTAN  POTASSIUM 100 MG TAB] 90 tablet 0    Sig: TAKE 1 TABLET BY MOUTH DAILY     Cardiovascular:  Angiotensin Receptor Blockers Failed - 03/12/2024  2:07 PM      Failed - Cr in normal range and within 180 days    Creat  Date Value Ref Range Status  04/26/2023 0.94 0.60 - 1.00 mg/dL Final   Creatinine, Ser  Date Value Ref Range Status  08/24/2023 0.95 0.44 - 1.00 mg/dL Final   Creatinine, Urine  Date Value Ref Range Status  10/13/2017 184 20 - 275 mg/dL Final         Failed - K in normal range and within 180 days    Potassium  Date Value Ref Range Status  08/24/2023 3.6 3.5 - 5.1 mmol/L Final  06/03/2012 3.9 3.5 - 5.1 mmol/L Final         Passed - Patient is not pregnant      Passed - Last BP in normal range    BP Readings from Last 1 Encounters:  03/07/24 130/80         Passed - Valid encounter within last 6 months    Recent Outpatient Visits           5 days ago Chronic cough   [facility] Bernardo Fend, DO   2 months ago Rhinosinusitis   [facility] Bernardo Fend, DO       Future Appointments             In 2 months Francisca Redell BROCKS, MD [facility] [place]

## 2024-03-12 NOTE — Telephone Encounter (Signed)
 EPINEPHrine  0.3 mg/0.3 mL IJ SOAJ injection

## 2024-03-13 NOTE — Telephone Encounter (Signed)
 Requested medication (s) are due for refill today: expired medication date  Requested medication (s) are on the active medication list: yes   Last refill:  11/17/22 #2 each 1 refills  Future visit scheduled: no  Notes to clinic:  expired medication date . Do you want to renew Rx?     Requested Prescriptions  Pending Prescriptions Disp Refills   EPINEPHrine  0.3 mg/0.3 mL IJ SOAJ injection [Pharmacy Med Name: EPINEPHrine  0.3 MG AUTO-INJECT 2PK] 2 each 1    Sig: INJECT INTO THE MIDDLE OF THE OUTER THIGH AND HOLD FOR 10 SECONDS FOR SEVERE ALLERGIC REACTION, THEN CALL 911 IF USED     Immunology: Antidotes Passed - 03/13/2024  1:03 PM      Passed - Valid encounter within last 12 months    Recent Outpatient Visits           6 days ago Chronic cough   [facility] Bernardo Fend, DO   2 months ago Rhinosinusitis   [facility] Bernardo Fend, DO       Future Appointments             In 2 months Francisca, Redell BROCKS, MD [facility] [place]

## 2024-03-20 ENCOUNTER — Other Ambulatory Visit: Payer: Self-pay | Admitting: Internal Medicine

## 2024-03-20 DIAGNOSIS — I10 Essential (primary) hypertension: Secondary | ICD-10-CM

## 2024-03-21 ENCOUNTER — Encounter: Payer: Self-pay | Admitting: Urology

## 2024-03-21 NOTE — Telephone Encounter (Signed)
 Requested Prescriptions  Pending Prescriptions Disp Refills   amLODipine  (NORVASC ) 5 MG tablet [Pharmacy Med Name: amLODIPine  BESYLATE 5 MG TAB] 90 tablet 0    Sig: TAKE 1 TABLET BY MOUTH DAILY     Cardiovascular: Calcium  Channel Blockers 2 Passed - 03/21/2024 12:09 PM      Passed - Last BP in normal range    BP Readings from Last 1 Encounters:  03/07/24 130/80         Passed - Last Heart Rate in normal range    Pulse Readings from Last 1 Encounters:  03/07/24 85         Passed - Valid encounter within last 6 months    Recent Outpatient Visits           2 weeks ago Chronic cough   [facility] Bernardo Fend, DO   2 months ago Rhinosinusitis   [facility] Bernardo Fend, DO       Future Appointments             In 2 months Francisca, Redell BROCKS, MD [facility] [place]

## 2024-04-12 ENCOUNTER — Other Ambulatory Visit: Payer: Self-pay | Admitting: Internal Medicine

## 2024-04-12 DIAGNOSIS — Z1231 Encounter for screening mammogram for malignant neoplasm of breast: Secondary | ICD-10-CM

## 2024-04-13 ENCOUNTER — Encounter: Payer: Self-pay | Admitting: Oncology

## 2024-04-22 ENCOUNTER — Other Ambulatory Visit: Payer: Self-pay | Admitting: Internal Medicine

## 2024-04-22 DIAGNOSIS — K219 Gastro-esophageal reflux disease without esophagitis: Secondary | ICD-10-CM

## 2024-04-24 NOTE — Telephone Encounter (Signed)
 Requested Prescriptions  Pending Prescriptions Disp Refills   omeprazole  (PRILOSEC) 20 MG capsule [Pharmacy Med Name: OMEPRAZOLE  DR 20 MG CAPSULE] 90 capsule 0    Sig: TAKE 1 CAPSULE BY MOUTH DAILY     Gastroenterology: Proton Pump Inhibitors Passed - 04/24/2024  7:48 AM      Passed - Valid encounter within last 12 months    Recent Outpatient Visits           1 month ago Chronic cough   [facility] Bernardo Fend, DO   3 months ago Rhinosinusitis   [facility] Bernardo Fend, DO       Future Appointments             In 1 month Francisca, Redell BROCKS, MD [facility] [place]

## 2024-04-25 DIAGNOSIS — Z7901 Long term (current) use of anticoagulants: Secondary | ICD-10-CM | POA: Diagnosis not present

## 2024-04-25 DIAGNOSIS — Z8679 Personal history of other diseases of the circulatory system: Secondary | ICD-10-CM | POA: Diagnosis not present

## 2024-04-25 DIAGNOSIS — I480 Paroxysmal atrial fibrillation: Secondary | ICD-10-CM | POA: Diagnosis not present

## 2024-04-25 DIAGNOSIS — Z9889 Other specified postprocedural states: Secondary | ICD-10-CM | POA: Diagnosis not present

## 2024-04-29 DIAGNOSIS — J45991 Cough variant asthma: Secondary | ICD-10-CM | POA: Diagnosis not present

## 2024-04-29 DIAGNOSIS — K2100 Gastro-esophageal reflux disease with esophagitis, without bleeding: Secondary | ICD-10-CM | POA: Diagnosis not present

## 2024-04-29 DIAGNOSIS — G4733 Obstructive sleep apnea (adult) (pediatric): Secondary | ICD-10-CM | POA: Diagnosis not present

## 2024-04-29 DIAGNOSIS — G4700 Insomnia, unspecified: Secondary | ICD-10-CM | POA: Diagnosis not present

## 2024-04-30 DIAGNOSIS — I480 Paroxysmal atrial fibrillation: Secondary | ICD-10-CM | POA: Diagnosis not present

## 2024-05-13 ENCOUNTER — Ambulatory Visit
Admission: RE | Admit: 2024-05-13 | Discharge: 2024-05-13 | Disposition: A | Discharge status: Home / Self Care | Source: Ambulatory Visit | Attending: Internal Medicine | Admitting: Internal Medicine

## 2024-05-13 DIAGNOSIS — Z1231 Encounter for screening mammogram for malignant neoplasm of breast: Secondary | ICD-10-CM | POA: Insufficient documentation

## 2024-05-15 DIAGNOSIS — J0110 Acute frontal sinusitis, unspecified: Secondary | ICD-10-CM | POA: Diagnosis not present

## 2024-05-15 DIAGNOSIS — R0602 Shortness of breath: Secondary | ICD-10-CM | POA: Diagnosis not present

## 2024-05-15 DIAGNOSIS — I480 Paroxysmal atrial fibrillation: Secondary | ICD-10-CM | POA: Diagnosis not present

## 2024-05-15 DIAGNOSIS — J4531 Mild persistent asthma with (acute) exacerbation: Secondary | ICD-10-CM | POA: Diagnosis not present

## 2024-05-15 DIAGNOSIS — J45991 Cough variant asthma: Secondary | ICD-10-CM | POA: Diagnosis not present

## 2024-05-16 ENCOUNTER — Ambulatory Visit: Payer: Self-pay | Admitting: Internal Medicine

## 2024-05-29 ENCOUNTER — Ambulatory Visit: Admitting: Urology

## 2024-05-30 ENCOUNTER — Ambulatory Visit: Payer: Self-pay | Admitting: Urology

## 2024-06-06 ENCOUNTER — Ambulatory Visit: Admitting: Internal Medicine

## 2024-06-08 ENCOUNTER — Other Ambulatory Visit: Payer: Self-pay | Admitting: Internal Medicine

## 2024-06-08 DIAGNOSIS — I10 Essential (primary) hypertension: Secondary | ICD-10-CM

## 2024-06-10 NOTE — Telephone Encounter (Signed)
 Requested medications are due for refill today.  yes  Requested medications are on the active medications list.  yes  Last refill. 03/12/2024 #90 0 rf  Future visit scheduled.   yes  Notes to clinic.  Labs are expired    Requested Prescriptions  Pending Prescriptions Disp Refills   losartan  (COZAAR ) 100 MG tablet [Pharmacy Med Name: LOSARTAN  POTASSIUM 100 MG TAB] 90 tablet 0    Sig: TAKE 1 TABLET BY MOUTH DAILY     Cardiovascular:  Angiotensin Receptor Blockers Failed - 06/10/2024  1:50 PM      Failed - Cr in normal range and within 180 days    Creat  Date Value Ref Range Status  04/26/2023 0.94 0.60 - 1.00 mg/dL Final   Creatinine, Ser  Date Value Ref Range Status  08/24/2023 0.95 0.44 - 1.00 mg/dL Final   Creatinine, Urine  Date Value Ref Range Status  10/13/2017 184 20 - 275 mg/dL Final         Failed - K in normal range and within 180 days    Potassium  Date Value Ref Range Status  08/24/2023 3.6 3.5 - 5.1 mmol/L Final  06/03/2012 3.9 3.5 - 5.1 mmol/L Final         Passed - Patient is not pregnant      Passed - Last BP in normal range    BP Readings from Last 1 Encounters:  03/07/24 130/80         Passed - Valid encounter within last 6 months    Recent Outpatient Visits           3 months ago Chronic cough   [facility] Bernardo Fend, DO   5 months ago Rhinosinusitis   [facility] Bernardo Fend, [place]

## 2024-06-11 ENCOUNTER — Inpatient Hospital Stay: Attending: Oncology

## 2024-06-11 DIAGNOSIS — D509 Iron deficiency anemia, unspecified: Secondary | ICD-10-CM | POA: Diagnosis present

## 2024-06-11 LAB — CBC WITH DIFFERENTIAL (CANCER CENTER ONLY)
Abs Immature Granulocytes: 0.01 K/uL (ref 0.00–0.07)
Basophils Absolute: 0 K/uL (ref 0.0–0.1)
Basophils Relative: 1 %
Eosinophils Absolute: 0.1 K/uL (ref 0.0–0.5)
Eosinophils Relative: 2 %
HCT: 39.3 % (ref 36.0–46.0)
Hemoglobin: 12.7 g/dL (ref 12.0–15.0)
Immature Granulocytes: 0 %
Lymphocytes Relative: 32 %
Lymphs Abs: 1.6 K/uL (ref 0.7–4.0)
MCH: 27.9 pg (ref 26.0–34.0)
MCHC: 32.3 g/dL (ref 30.0–36.0)
MCV: 86.4 fL (ref 80.0–100.0)
Monocytes Absolute: 0.4 K/uL (ref 0.1–1.0)
Monocytes Relative: 8 %
Neutro Abs: 2.9 K/uL (ref 1.7–7.7)
Neutrophils Relative %: 57 %
Platelet Count: 240 K/uL (ref 150–400)
RBC: 4.55 MIL/uL (ref 3.87–5.11)
RDW: 14.6 % (ref 11.5–15.5)
WBC Count: 5.1 K/uL (ref 4.0–10.5)
nRBC: 0 % (ref 0.0–0.2)

## 2024-06-11 LAB — RETIC PANEL
Immature Retic Fract: 12.7 % (ref 2.3–15.9)
RBC.: 4.48 MIL/uL (ref 3.87–5.11)
Retic Count, Absolute: 43.5 K/uL (ref 19.0–186.0)
Retic Ct Pct: 1 % (ref 0.4–3.1)
Reticulocyte Hemoglobin: 30.9 pg (ref >27.9)

## 2024-06-11 LAB — IRON AND TIBC
Iron: 58 ug/dL (ref 28–170)
Saturation Ratios: 15 % (ref 10.4–31.8)
TIBC: 388 ug/dL (ref 250–450)
UIBC: 330 ug/dL

## 2024-06-11 LAB — FERRITIN: Ferritin: 14 ng/mL (ref 11–307)

## 2024-06-14 ENCOUNTER — Telehealth: Payer: Self-pay | Admitting: Oncology

## 2024-06-14 NOTE — Telephone Encounter (Signed)
 Pt called to cancel md/iron  appt on 10/14. Stated she saw her lab results on mychart and they were good so she doesn't want to come in.   Appts canceled and noted.

## 2024-06-15 ENCOUNTER — Other Ambulatory Visit: Payer: Self-pay | Admitting: Internal Medicine

## 2024-06-15 DIAGNOSIS — I10 Essential (primary) hypertension: Secondary | ICD-10-CM

## 2024-06-17 ENCOUNTER — Ambulatory Visit: Payer: Self-pay | Admitting: *Deleted

## 2024-06-17 NOTE — Telephone Encounter (Signed)
 Second request

## 2024-06-17 NOTE — Telephone Encounter (Signed)
 FYI Only or Action Required?: Action required by provider: request for appointment.  Patient was last seen in primary care on 03/07/2024 by Bernardo Fend, DO.  Called Nurse Triage reporting Cough.  Symptoms began several days ago.  Interventions attempted: Prescription medications: inhaler.  Symptoms are: gradually worsening.  Triage Disposition: See HCP Within 4 Hours (Or PCP Triage)  Patient/caregiver understands and will follow disposition?: No, wishes to speak with PCP   No available appt today. Recommended UC . Patient would like to wait until already scheduled appt for 06/19/24 to come in if can not come in tomorrow.                 Copied from CRM [id number]. Topic: Clinical - Red Word Triage >> Jun 17, 2024  8:36 AM Delon HERO wrote: Red Word that prompted transfer to Nurse Triage: Patient is calling to report that she has been weak, cough, with shortness of breath, ear pain. Reason for Disposition  [1] MILD difficulty breathing (e.g., minimal/no SOB at rest, SOB with walking, pulse < 100) AND [2] still present when not coughing  Answer Assessment - Initial Assessment Questions No available appt today as recommended. Recommended UC and patient report she already has appt for 3 month f/u scheduled for 06/19/24. Please advise . Patient does not want to go to UC. Requesting call back.  CAL notified    1. ONSET: When did the cough begin?      2 weeks ago  2. SEVERITY: How bad is the cough today?      Not as bad  3. SPUTUM: Describe the color of your sputum (e.g., none, dry cough; clear, white, yellow, green)     None  4. HEMOPTYSIS: Are you coughing up any blood? If Yes, ask: How much? (e.g., flecks, streaks, tablespoons, etc.)     na 5. DIFFICULTY BREATHING: Are you having difficulty breathing? If Yes, ask: How bad is it? (e.g., mild, moderate, severe)      Shortness of breath  with coughing  6. FEVER: Do you have a fever? If Yes, ask: What  is your temperature, how was it measured, and when did it start?     na 7. CARDIAC HISTORY: Do you have any history of heart disease? (e.g., heart attack, congestive heart failure)      Hx  irregular heart beat  8. LUNG HISTORY: Do you have any history of lung disease?  (e.g., pulmonary embolus, asthma, emphysema)     Hx asthma  9. PE RISK FACTORS: Do you have a history of blood clots? (or: recent major surgery, recent prolonged travel, bedridden)     na 10. OTHER SYMPTOMS: Do you have any other symptoms? (e.g., runny nose, wheezing, chest pain)       Chest discomfort at times not now, non productive cough, left ear pain sharp, and dull at times x 2-3 days no drainage . No fever. Just not feeling well. 11. PREGNANCY: Is there any chance you are pregnant? When was your last menstrual period?       na 12. TRAVEL: Have you traveled out of the country in the last month? (e.g., travel history, exposures)       na  Protocols used: Cough - Acute Non-Productive-A-AH

## 2024-06-18 ENCOUNTER — Inpatient Hospital Stay: Admitting: Oncology

## 2024-06-18 ENCOUNTER — Inpatient Hospital Stay

## 2024-06-18 NOTE — Telephone Encounter (Signed)
 Requested Prescriptions  Pending Prescriptions Disp Refills   amLODipine  (NORVASC ) 5 MG tablet [Pharmacy Med Name: amLODIPine  BESYLATE 5 MG TAB] 90 tablet 0    Sig: TAKE 1 TABLET BY MOUTH DAILY     Cardiovascular: Calcium  Channel Blockers 2 Passed - 06/18/2024 11:13 AM      Passed - Last BP in normal range    BP Readings from Last 1 Encounters:  03/07/24 130/80         Passed - Last Heart Rate in normal range    Pulse Readings from Last 1 Encounters:  03/07/24 85         Passed - Valid encounter within last 6 months    Recent Outpatient Visits           3 months ago Chronic cough   [facility] Bernardo Fend, DO   5 months ago Rhinosinusitis   [facility] Bernardo Fend, [place]

## 2024-06-19 ENCOUNTER — Other Ambulatory Visit: Payer: Self-pay

## 2024-06-19 ENCOUNTER — Encounter: Payer: Self-pay | Admitting: Internal Medicine

## 2024-06-19 ENCOUNTER — Ambulatory Visit (INDEPENDENT_AMBULATORY_CARE_PROVIDER_SITE_OTHER): Admitting: Internal Medicine

## 2024-06-19 VITALS — BP 128/76 | HR 87 | Temp 98.0°F | Resp 16 | Ht 65.0 in | Wt 166.9 lb

## 2024-06-19 DIAGNOSIS — I10 Essential (primary) hypertension: Secondary | ICD-10-CM | POA: Diagnosis not present

## 2024-06-19 DIAGNOSIS — J45901 Unspecified asthma with (acute) exacerbation: Secondary | ICD-10-CM

## 2024-06-19 DIAGNOSIS — R7303 Prediabetes: Secondary | ICD-10-CM | POA: Diagnosis not present

## 2024-06-19 DIAGNOSIS — G4700 Insomnia, unspecified: Secondary | ICD-10-CM

## 2024-06-19 DIAGNOSIS — I480 Paroxysmal atrial fibrillation: Secondary | ICD-10-CM

## 2024-06-19 DIAGNOSIS — E782 Mixed hyperlipidemia: Secondary | ICD-10-CM | POA: Diagnosis not present

## 2024-06-19 DIAGNOSIS — R051 Acute cough: Secondary | ICD-10-CM

## 2024-06-19 MED ORDER — AMLODIPINE BESYLATE 5 MG PO TABS: 5.0000 mg | ORAL_TABLET | Freq: Every day | ORAL | 0 refills | Status: AC

## 2024-06-19 MED ORDER — METHYLPREDNISOLONE 4 MG PO TBPK: ORAL_TABLET | ORAL | 0 refills | Status: DC

## 2024-06-19 MED ORDER — HYDROCOD POLI-CHLORPHE POLI ER 10-8 MG/5ML PO SUER: 5.0000 mL | Freq: Two times a day (BID) | ORAL | 0 refills | Status: AC | PRN

## 2024-06-19 MED ORDER — ATORVASTATIN CALCIUM 10 MG PO TABS: 10.0000 mg | ORAL_TABLET | ORAL | 2 refills | Status: AC

## 2024-06-19 MED ORDER — LOSARTAN POTASSIUM 100 MG PO TABS: 100.0000 mg | ORAL_TABLET | Freq: Every day | ORAL | 0 refills | Status: AC

## 2024-06-19 MED ORDER — TRAZODONE HCL 150 MG PO TABS: 150.0000 mg | ORAL_TABLET | Freq: Every day | ORAL | 1 refills | Status: AC

## 2024-06-19 NOTE — Progress Notes (Signed)
 Established Patient Office Visit  Subjective   Patient ID: Sonya Harper, female    DOB: 1946-03-13  Age: 78 y.o. MRN: [id number]  Chief Complaint  Patient presents with   Medical Management of Chronic Issues    3 month recheck    HPI  Patient is here for follow up on chronic medical conditions.   Discussed the use of AI scribe software for clinical note transcription with the patient, who gave verbal consent to proceed.  History of Present Illness Sonya Harper is a 78 year old female with atrial fibrillation and hypertension who presents for a follow-up visit.  She is experiencing unusual weakness and worsening respiratory symptoms, including a persistent cough and sinus drainage. She suspects mold exposure at her church may be contributing to her symptoms due to her known allergies to dust and mold. She has a history of severe reactions to mold exposure and continues to follow up with her pulmonologist, using inhalers as part of her treatment regimen.  She is currently taking amlodipine  and losartan  for hypertension, and Eliquis  for atrial fibrillation, which she takes twice a day. She also takes Lipitor 10 mg every other day for cholesterol management. Her trazodone  for sleep is due for a refill. She takes levothyroxine  50 mcg for hypothyroidism, with normal thyroid  levels checked in August.  Her insurance changes require finding new specialists for her atrial fibrillation and hypothyroidism management. She had a colonoscopy in August 2024 due to polyps and is scheduled for a repeat in 2027. Her recent mammogram was normal. She has had shingles and COVID vaccinations and is considering the RSV vaccine. She declined the flu vaccine, preferring to wait until she feels better.   Hypertension/A.fib: -Medications: Amlodipine  5 mg, Losartan  100 mg, Eliquis  5 mg BID -Patient is compliant with above medications and reports no side effects. -Checking BP at home (average): Not  checking  -Denies any acute vision changes, LE edema or symptoms of hypotension -Follows with Cardiology and EP but just found out her insurance will no longer cover [facility] providers and will need a new referral. Had been on Sotalol  for rhythm control in the past, however this was discontinued as she did not have reoccurrence of symptoms.  Nuclear stress test in 2018 was unremarkable.  TEE from 3/22 unremarkable as well with a normal EF.  HLD: -Medications: Lipitor 10 mg every other day  -Patient is compliant with above medications and reports no side effects.  -Last lipid panel: Lipid Panel     Component Value Date/Time   CHOL 141 01/03/2023 1121   TRIG 64 01/03/2023 1121   HDL 61 01/03/2023 1121   CHOLHDL 2.3 01/03/2023 1121   LDLCALC 66 01/03/2023 1121    Hx of Pre-Diabetes: -Last A1c 6.0% 4/24, 5.4% 10/24 -Not currently on any medication  Asthma:  -Asthma status: has upper respiratory symptoms currently  -Current Treatments: Breo-Ellipta 200 daily, albuterol  PRN -Satisfied with current treatment?: yes -Albuterol /rescue inhaler frequency: Occasionally -Dyspnea frequency: daily -Wheezing frequency: daily -Cough frequency: Daily, nonproductive -Limitation of activity: yes, feeling weak -Current upper respiratory symptoms: yes -Visits to ER or Urgent Care in past year: no -Pneumovax: UTD -Influenza: Will get when she is feeling better   GERD: -Currently on Protonix  40 mg  -Had EGD 1/21, which did show gastritis, negative for dysplasia.    Hypothyroidism: -Medications: Levothyroxine  50 mcg  -Patient is compliant with the above medication (s) at the above dose and reports no medication side effects.  -Denies weight changes,  cold./heat intolerance, skin changes, anxiety/palpitations  -Last TSH: 1.39 8/25  -Had been seeing Endocrinology but insurance no longer covering [facility], discussed that I can take over her Levothyroxine  prescription   Insomnia:  -Currently on Trazodone  150  mg, doing well   Health Maintenance: -Blood work due -Mammogram 9/5 Birads-2 -Colonoscopy 8/24, repeat in 3 years -Discussed RSV vaccine and flu vaccine at the pharmacy when she is feeling better  Patient Active Problem List   Diagnosis Date Noted   Heart murmur 08/01/2023   Iron  deficiency anemia 04/06/2023   Polyp of descending colon 04/06/2023   Status post radiofrequency ablation for arrhythmia 12/12/2022   Exposure to COVID-19 virus 09/23/2020   Hypothyroidism, adult 05/21/2020   Persistent asthma with acute exacerbation 04/24/2020   Polyp of transverse colon    Polyp of ascending colon    Chronic anticoagulation 09/12/2019   Paroxysmal atrial fibrillation (HCC) 09/12/2019   AKI (acute kidney injury) 06/26/2019   Grade I diastolic dysfunction 06/26/2019   Mild mitral regurgitation 06/26/2019   Mild tricuspid regurgitation 06/26/2019   Lumbar facet arthropathy 11/06/2018   Prediabetes 02/27/2018   Medication monitoring encounter 11/13/2017   Abnormal laboratory test 10/16/2017   Palpitations 08/23/2017   Breast pain in female 08/10/2017   Obesity (BMI 30.0-34.9) 08/10/2017   Protein in urine 08/10/2017   Vaginal atrophy 08/10/2017   Benign essential HTN 11/16/2016   Hyperlipidemia, mixed 11/16/2016   SOBOE (shortness of breath on exertion) 11/16/2016   Dysrhythmia 07/07/2016   Right low back pain 07/07/2016   Fall on or from stairs or steps 07/07/2016   Family history of cancer    Benign neoplasm of ascending colon    Benign neoplasm of transverse colon    Benign neoplasm of descending colon    Goiter 03/03/2015   Asthma, mild persistent 03/03/2015   Gastroesophageal reflux disease without esophagitis 03/03/2015   Past Medical History:  Diagnosis Date   A-fib [facility])    Allergy    Arthritis    feet   Asthma    Breast neoplasm    Chronic lower back pain    s/p fall injury   Depression    Dysrhythmia    Fibromyalgia    Hypertension    Hypothyroidism     Lumbar facet arthropathy 11/06/2018   Prediabetes 02/27/2018   Sciatica of right side    Wears dentures    partial upper and lower   Past Surgical History:  Procedure Laterality Date   ABDOMINAL HYSTERECTOMY     BIOPSY  04/06/2023   Procedure: BIOPSY;  Surgeon: Jinny Carmine, MD;  Location: [facility] END[facility]COPY;  Service: Endoscopy;;   CATARACT EXTRACTION W/PHACO Right 02/02/2022   Procedure: CATARACT EXTRACTION PHACO AND INTRAOCULAR LENS PLACEMENT (IOC) RIGHT;  Surgeon: Mittie Gaskin, MD;  Location: [facility];  Service: Ophthalmology;  Laterality: Right;  4.52 00:45.9   CATARACT EXTRACTION W/PHACO Left 02/16/2022   Procedure: CATARACT EXTRACTION PHACO AND INTRAOCULAR LENS PLACEMENT (IOC) LEFT 7.05 01:19.8;  Surgeon: Mittie Gaskin, MD;  Location: [facility];  Service: Ophthalmology;  Laterality: Left;   COLONOSCOPY WITH PROPOFOL  N/A 05/13/2016   Procedure: COLONOSCOPY WITH PROPOFOL ;  Surgeon: Carmine Jinny, MD;  Location: [facility];  Service: Endoscopy;  Laterality: N/A;   COLONOSCOPY WITH PROPOFOL  N/A 09/20/2019   Procedure: COLONOSCOPY WITH PROPOFOL ;  Surgeon: Jinny Carmine, MD;  Location: [facility];  Service: Endoscopy;  Laterality: N/A;   COLONOSCOPY WITH PROPOFOL  N/A 04/06/2023   Procedure: COLONOSCOPY WITH PROPOFOL ;  Surgeon: Jinny Carmine,  MD;  Location: [facility];  Service: Endoscopy;  Laterality: N/A;   ELECTROPHYSIOLOGIC STUDY     Ablation   ESOPHAGOGASTRODUODENOSCOPY (EGD) WITH PROPOFOL  N/A 09/20/2019   Procedure: ESOPHAGOGASTRODUODENOSCOPY (EGD) WITH PROPOFOL ;  Surgeon: Jinny Carmine, MD;  Location: [facility];  Service: Endoscopy;  Laterality: N/A;   ESOPHAGOGASTRODUODENOSCOPY (EGD) WITH PROPOFOL  N/A 04/06/2023   Procedure: ESOPHAGOGASTRODUODENOSCOPY (EGD) WITH PROPOFOL ;  Surgeon: Jinny Carmine, MD;  Location: [facility];  Service: Endoscopy;  Laterality: N/A;   GIVENS CAPSULE STUDY N/A 05/02/2023   Procedure: GIVENS CAPSULE STUDY;   Surgeon: Jinny Carmine, MD;  Location: [facility] END[facility];  Service: Endoscopy;  Laterality: N/A;   POLYPECTOMY N/A 05/13/2016   Procedure: POLYPECTOMY;  Surgeon: Carmine Jinny, MD;  Location: [facility];  Service: Endoscopy;  Laterality: N/A;   POLYPECTOMY  04/06/2023   Procedure: POLYPECTOMY;  Surgeon: Jinny Carmine, MD;  Location: [facility] END[facility];  Service: Endoscopy;;   Social History   Tobacco Use   Smoking status: Never    Passive exposure: Never   Smokeless tobacco: Never  Vaping Use   Vaping status: Never Used  Substance Use Topics   Alcohol use: No    Alcohol/week: 0.0 standard drinks of alcohol   Drug use: Never   Social History   Socioeconomic History   Marital status: Married    Spouse name: Not on file   Number of children: Not on file   Years of education: Not on file   Highest education level: Not on file  Occupational History   Not on file  Tobacco Use   Smoking status: Never    Passive exposure: Never   Smokeless tobacco: Never  Vaping Use   Vaping status: Never Used  Substance and Sexual Activity   Alcohol use: No    Alcohol/week: 0.0 standard drinks of alcohol   Drug use: Never   Sexual activity: Not Currently    Birth control/protection: Post-menopausal  Other Topics Concern   Not on file  Social History Narrative   Not on file   Social Drivers of Health   Financial Resource Strain: Low Risk  (09/18/2023)   Received from [facility]   Overall Financial Resource Strain (CARDIA)    Difficulty of Paying Living Expenses: Not hard at all  Food Insecurity: No Food Insecurity (09/18/2023)   Received from [facility]   Hunger Vital Sign    Within the past 12 months, you worried that your food would run out before you got the money to buy more.: Never true    Within the past 12 months, the food you bought just didn't last and you didn't have money to get more.: Never true  Transportation Needs: No Transportation Needs  (09/18/2023)   Received from [facility] - Transportation    In the past 12 months, has lack of transportation kept you from medical appointments or from getting medications?: No    Lack of Transportation (Non-Medical): No  Physical Activity: Not on file  Stress: Not on file  Social Connections: Not on file  Intimate Partner Violence: Not At Risk (05/05/2023)   Humiliation, Afraid, Rape, and Kick questionnaire    Fear of Current or Ex-Partner: No    Emotionally Abused: No    Physically Abused: No    Sexually Abused: No   Family Status  Relation Name Status   Mother  Deceased   Father  Deceased   Sister  (Not Specified)   Sister  Deceased  Bruna Nyhan  (Not Specified)   Daughter  Alive  No partnership data on file   Family History  Problem Relation Age of Onset   Stroke Mother    Breast cancer Sister 31       breast ca x3   Cancer Sister    Colon cancer Paternal Aunt    Breast cancer Daughter    Allergies  Allergen Reactions   Molds & Smuts Anaphylaxis   Penicillins Nausea And Vomiting and Rash    Has patient had a PCN reaction causing immediate rash, facial/tongue/throat swelling, SOB or lightheadedness with hypotension: Yes Has patient had a PCN reaction causing severe rash involving mucus membranes or skin necrosis: No Has patient had a PCN reaction that required hospitalization: No Has patient had a PCN reaction occurring within the last 10 years: No If all of the above answers are NO, then may proceed with Cephalosporin use.       Review of Systems  Constitutional:  Positive for malaise/fatigue. Negative for chills and fever.  HENT:  Positive for ear pain, sinus pain and sore throat.   Respiratory:  Positive for cough and shortness of breath. Negative for wheezing.   Cardiovascular:  Negative for chest pain and palpitations.      Objective:     BP 128/76 (Cuff Size: Large)   Pulse 87   Temp 98 F (36.7 C) (Oral)   Resp 16    Ht 5' 5 (1.651 m)   Wt 166 lb 14.4 oz (75.7 kg)   SpO2 95%   BMI 27.77 kg/m  BP Readings from Last 3 Encounters:  06/19/24 128/76  03/07/24 130/80  12/28/23 138/84   Wt Readings from Last 3 Encounters:  06/19/24 166 lb 14.4 oz (75.7 kg)  03/07/24 160 lb 6.4 oz (72.8 kg)  12/28/23 162 lb 3.2 oz (73.6 kg)      Physical Exam Constitutional:      Appearance: Normal appearance.  HENT:     Head: Normocephalic and atraumatic.     Right Ear: Tympanic membrane, ear canal and external ear normal.     Left Ear: Tympanic membrane, ear canal and external ear normal.     Nose: Congestion present.     Mouth/Throat:     Mouth: Mucous membranes are moist.     Pharynx: Oropharynx is clear.  Eyes:     Conjunctiva/sclera: Conjunctivae normal.  Cardiovascular:     Rate and Rhythm: Normal rate. Rhythm irregular.  Pulmonary:     Effort: Pulmonary effort is normal.     Breath sounds: Normal breath sounds. No wheezing, rhonchi or rales.  Skin:    General: Skin is warm and dry.  Neurological:     General: No focal deficit present.     Mental Status: She is alert. Mental status is at baseline.  Psychiatric:        Mood and Affect: Mood normal.        Behavior: Behavior normal.      No results found for any visits on 06/19/24.  Last CBC Lab Results  Component Value Date   WBC 5.1 06/11/2024   HGB 12.7 06/11/2024   HCT 39.3 06/11/2024   MCV 86.4 06/11/2024   MCH 27.9 06/11/2024   RDW 14.6 06/11/2024   PLT 240 06/11/2024   Last metabolic panel Lab Results  Component Value Date   GLUCOSE 98 08/24/2023   NA 136 08/24/2023   K 3.6 08/24/2023   CL 103 08/24/2023   CO2 23  08/24/2023   BUN 12 08/24/2023   CREATININE 0.95 08/24/2023   GFRNONAA >60 08/24/2023   CALCIUM  8.3 (L) 08/24/2023   PHOS 3.5 02/26/2018   PROT 6.8 05/14/2023   ALBUMIN 3.7 05/14/2023   BILITOT 0.7 05/14/2023   ALKPHOS 86 05/14/2023   AST 19 05/14/2023   ALT 15 05/14/2023   ANIONGAP 10 08/24/2023    Last lipids Lab Results  Component Value Date   CHOL 141 01/03/2023   HDL 61 01/03/2023   LDLCALC 66 01/03/2023   TRIG 64 01/03/2023   CHOLHDL 2.3 01/03/2023   Last hemoglobin A1c Lab Results  Component Value Date   HGBA1C 5.4 07/05/2023   Last thyroid  functions Lab Results  Component Value Date   TSH 1.73 04/26/2023   Last vitamin D  Lab Results  Component Value Date   VD25OH 49 04/26/2023   Last vitamin B12 and Folate Lab Results  Component Value Date   VITAMINB12 412 04/26/2023      The 10-year ASCVD risk score (Arnett DK, et al., 2019) is: 12.4%    Assessment & Plan:   Assessment & Plan Allergic and inflammatory respiratory disease Worsening respiratory symptoms likely due to mold exposure and seasonal allergies. No infection noted. Significant weakness reported with mold exposure history. - Prescribe Medrol  Dose pack. - Prescribe effective antitussive . - Recommend avoiding mold exposure. - Continue follow-up with pulmonologist.  Atrial fibrillation Recent EKG normal. Transitioning care due to insurance changes. - Refer to electrophysiologist at [facility].  Hypertension Blood pressure well-controlled at 128/76 mmHg. Medications due for refill. - Refill amlodipine . - Refill losartan .  Hyperlipidemia Long-standing hyperlipidemia managed with Lipitor. Last cholesterol check in April 2024. - Order cholesterol test. - Refill Lipitor.  Prediabetes Prediabetes stable for over 30 years. Last A1c within target range. - Order A1c test.  Hypothyroidism Hypothyroidism well-managed. Last thyroid  function test normal. Transitioning care due to insurance changes. - Continue levothyroxine  50 mcg. - Monitor thyroid  function as needed.  Insomnia Stable. Medication due for refill. - Refill trazodone .  - amLODipine  (NORVASC ) 5 MG tablet; Take 1 tablet (5 mg total) by mouth daily.  Dispense: 90 tablet; Refill: 0 - losartan  (COZAAR ) 100 MG tablet; Take 1 tablet  (100 mg total) by mouth daily.  Dispense: 30 tablet; Refill: 0 - Ambulatory referral to Cardiology - Lipid Profile - atorvastatin  (LIPITOR) 10 MG tablet; Take 1 tablet (10 mg total) by mouth every other day.  Dispense: 45 tablet; Refill: 2 - HgB A1c - methylPREDNISolone  (MEDROL  DOSEPAK) 4 MG TBPK tablet; Use as directed.  Dispense: 21 each; Refill: 0 - chlorpheniramine-HYDROcodone  (TUSSIONEX) 10-8 MG/5ML; Take 5 mLs by mouth every 12 (twelve) hours as needed for up to 5 days.  Dispense: 50 mL; Refill: 0 - traZODone  (DESYREL ) 150 MG tablet; Take 1 tablet (150 mg total) by mouth at bedtime.  Dispense: 90 tablet; Refill: 1   Return in about 6 months (around 12/18/2024).    Sharyle Fischer, DO

## 2024-06-20 ENCOUNTER — Ambulatory Visit: Payer: Self-pay | Admitting: Internal Medicine

## 2024-06-20 LAB — LIPID PANEL
Cholesterol: 179 mg/dL (ref <200)
HDL: 65 mg/dL (ref >=50)
LDL Cholesterol (Calc): 98 mg/dL
Non-HDL Cholesterol (Calc): 114 mg/dL (ref <130)
Total CHOL/HDL Ratio: 2.8 (calc) (ref <5.0)
Triglycerides: 75 mg/dL (ref <150)

## 2024-06-20 LAB — HEMOGLOBIN A1C
Hgb A1c MFr Bld: 5.8 % — ABNORMAL HIGH (ref <5.7)
Mean Plasma Glucose: 120 mg/dL
eAG (mmol/L): 6.6 mmol/L

## 2024-06-23 ENCOUNTER — Emergency Department

## 2024-06-23 ENCOUNTER — Other Ambulatory Visit: Payer: Self-pay

## 2024-06-23 ENCOUNTER — Emergency Department
Admission: EM | Admit: 2024-06-23 | Discharge: 2024-06-23 | Disposition: A | Discharge status: Home / Self Care | Attending: Emergency Medicine | Admitting: Emergency Medicine

## 2024-06-23 DIAGNOSIS — E039 Hypothyroidism, unspecified: Secondary | ICD-10-CM | POA: Diagnosis not present

## 2024-06-23 DIAGNOSIS — I10 Essential (primary) hypertension: Secondary | ICD-10-CM | POA: Diagnosis not present

## 2024-06-23 DIAGNOSIS — S0990XA Unspecified injury of head, initial encounter: Secondary | ICD-10-CM | POA: Insufficient documentation

## 2024-06-23 DIAGNOSIS — W010XXA Fall on same level from slipping, tripping and stumbling without subsequent striking against object, initial encounter: Secondary | ICD-10-CM | POA: Insufficient documentation

## 2024-06-23 DIAGNOSIS — J45909 Unspecified asthma, uncomplicated: Secondary | ICD-10-CM | POA: Diagnosis not present

## 2024-06-23 DIAGNOSIS — Z7901 Long term (current) use of anticoagulants: Secondary | ICD-10-CM | POA: Insufficient documentation

## 2024-06-23 DIAGNOSIS — R0789 Other chest pain: Secondary | ICD-10-CM | POA: Diagnosis not present

## 2024-06-23 DIAGNOSIS — M542 Cervicalgia: Secondary | ICD-10-CM | POA: Insufficient documentation

## 2024-06-23 DIAGNOSIS — M545 Low back pain, unspecified: Secondary | ICD-10-CM | POA: Insufficient documentation

## 2024-06-23 LAB — BASIC METABOLIC PANEL WITH GFR
Anion gap: 9 (ref 5–15)
BUN: 25 mg/dL — ABNORMAL HIGH (ref 8–23)
CO2: 26 mmol/L (ref 22–32)
Calcium: 8.8 mg/dL — ABNORMAL LOW (ref 8.9–10.3)
Chloride: 107 mmol/L (ref 98–111)
Creatinine, Ser: 1.05 mg/dL — ABNORMAL HIGH (ref 0.44–1.00)
GFR, Estimated: 55 mL/min — ABNORMAL LOW (ref >60)
Glucose, Bld: 153 mg/dL — ABNORMAL HIGH (ref 70–99)
Potassium: 4.1 mmol/L (ref 3.5–5.1)
Sodium: 142 mmol/L (ref 135–145)

## 2024-06-23 LAB — CBC
HCT: 39.3 % (ref 36.0–46.0)
Hemoglobin: 12.6 g/dL (ref 12.0–15.0)
MCH: 28.1 pg (ref 26.0–34.0)
MCHC: 32.1 g/dL (ref 30.0–36.0)
MCV: 87.5 fL (ref 80.0–100.0)
Platelets: 250 K/uL (ref 150–400)
RBC: 4.49 MIL/uL (ref 3.87–5.11)
RDW: 14.7 % (ref 11.5–15.5)
WBC: 7 K/uL (ref 4.0–10.5)
nRBC: 0 % (ref 0.0–0.2)

## 2024-06-23 LAB — TROPONIN I (HIGH SENSITIVITY)
Troponin I (High Sensitivity): 9 ng/L (ref <18)
Troponin I (High Sensitivity): 10 ng/L (ref <18)

## 2024-06-23 MED ORDER — GADOBUTROL 1 MMOL/ML IV SOLN
7.0000 mL | Freq: Once | INTRAVENOUS | Status: AC | PRN
  Administered 2024-06-23: 7 mL via INTRAVENOUS

## 2024-06-23 NOTE — ED Notes (Signed)
 Pt discharged to home, instructions reviewed with pt and daughter.  Both parties verbalized understanding, no question at this time.

## 2024-06-23 NOTE — Discharge Instructions (Signed)
 The workup for chest pain was reassuring.  There were no signs of strain on your heart.  The MRIs of your spine do not show any injuries.  The MRI of your brain shows a microhemorrhage which is a very small area of bleeding in the right front part of the brain.  We consulted the neurosurgery specialist.  Dr. Andrez office will reach out to you to arrange for follow-up.  You will need a repeat MRI in approximately 3 months.  You should continue your Eliquis  as prescribed.  In the meantime, return to the ER immediately for new, worsening, or persistent severe chest pain, headache, dizziness, vision changes, speech difficulty, weakness or numbness, other strokelike symptoms, or any other new or worsening symptoms that concern you.

## 2024-06-23 NOTE — ED Notes (Signed)
 Pt taken to xray and then to ED 18

## 2024-06-23 NOTE — ED Triage Notes (Addendum)
 Pt to ED via POV from [employer]. Pt reports slipped and fell backwards on water . Pt reports a little bit later was having centralized CP and upper back pain. Pt reports cardiac hx. Eliquis  listed in chart

## 2024-06-23 NOTE — ED Provider Notes (Signed)
 [facility] Provider Note    Event Date/Time   First MD Initiated Contact with Patient 06/23/24 1658     (approximate)   History   Fall and Chest Pain   HPI  Sonya Harper is a 78 y.o. female with a history of hypertension, atrial fibrillation on Eliquis , hyperlipidemia, prediabetes, asthma, hypothyroidism, and GERD who presents with a fall subsequent chest pain.  The patient states that she was at a [employer] when she slipped and fell on the floor that was apparently wet, causing her to fall backwards onto her back.  She is unsure if she hit her head but did not lose consciousness.  Subsequently while in the car on the way here she started having sharp right-sided chest pain with no shortness of breath or associated symptoms.  The chest pain has now subsided.  She does report some low back pain as well as some right sided neck pain.  She has mild pain to the left wrist.  She denies other injuries.  I reviewed the past medical records.  The patient's most recent outpatient encounter was with internal medicine on 10/15 for follow-up of her chronic conditions.   Physical Exam   Triage Vital Signs: ED Triage Vitals  Encounter Vitals Group     BP 06/23/24 1632 (!) 169/77     Girls Systolic BP Percentile --      Girls Diastolic BP Percentile --      Boys Systolic BP Percentile --      Boys Diastolic BP Percentile --      Pulse Rate 06/23/24 1632 81     Resp 06/23/24 1632 18     Temp 06/23/24 1631 97.9 F (36.6 C)     Temp Source 06/23/24 1631 Oral     SpO2 06/23/24 1632 98 %     Weight --      Height --      Head Circumference --      Peak Flow --      Pain Score 06/23/24 1631 3     Pain Loc --      Pain Education --      Exclude from Growth Chart --     Most recent vital signs: Vitals:   06/23/24 1632 06/23/24 2054  BP: (!) 169/77 (!) 150/88  Pulse: 81 73  Resp: 18 16  Temp:  98.3 F (36.8 C)  SpO2: 98% 96%    General: Alert and  oriented, no distress.  CV:  Good peripheral perfusion.  Resp:  Normal effort.  Lungs CTAB. Abd:  No distention.  Other:  Mild lumbar midline spinal tenderness with no step-off or crepitus.  No cervical or thoracic midline tenderness.  Neck with full ROM.  Chest wall nontender.  Motor intact in all extremities.  Normal speech and coordination.  Left wrist nontender, full ROM.   ED Results / Procedures / Treatments   Labs (all labs ordered are listed, but only abnormal results are displayed) Labs Reviewed  BASIC METABOLIC PANEL WITH GFR - Abnormal; Notable for the following components:      Result Value   Glucose, Bld 153 (*)    BUN 25 (*)    Creatinine, Ser 1.05 (*)    Calcium  8.8 (*)    GFR, Estimated 55 (*)    All other components within normal limits  CBC  TROPONIN I (HIGH SENSITIVITY)  TROPONIN I (HIGH SENSITIVITY)     EKG  ED ECG REPORT I, Waylon  Valena Ivanov, the attending physician, personally viewed and interpreted this ECG.  Date: 06/23/2024 EKG Time: 1632 Rate: 74  Rhythm: normal sinus rhythm QRS Axis: Left axis Intervals: normal ST/T Wave abnormalities: normal Narrative Interpretation: no evidence of acute ischemia    RADIOLOGY  Chest x-ray: I independently viewed and interpreted the images; there is no focal consolidation or edema  CT head:  IMPRESSION:  1. 0.7 x 0.6 x 0.6 cm hyperattenuating lesion in the right frontal operculum  with mild vasogenic edema, suspicious for an intra-axial mass. Recommend brain  MRI with and without contrast for further evaluation.  2. No acute intracranial hemorrhage or midline shift related to head trauma.   CT cervical spine:  IMPRESSION:  1. No acute fracture or traumatic malalignment of the cervical spine.  2. Abnormal sclerotic foci within the posterior aspects of the C3C5 vertebral  bodies. Recommend contrast-enhanced MRI of the cervical spine for further  evaluation.  3. Multilevel degenerative changes.    CT thoracic spine:  IMPRESSION:  1. Irregular T3 superior endplate with approximately 20% height loss,  age-indeterminate compression fracture. MRI recommended for further evaluation.  2. Thoracic degenerative changes as above. Moderate to severe right foraminal  stenosis at T3-T4.  3. Spiculated 1.0 cm left upper lobe pulmonary nodule. Dedicated chest CT  recommended for complete evaluation.  4. Right apical 3 mm pulmonary nodule.   CT lumbar spine:  IMPRESSION:  1. No evidence of acute traumatic injury.  2. Multilevel degenerative changes as above. Mild spinal canal stenosis at  multiple levels. Foraminal stenosis greatest at L4-5.  3. Incidental 5.7 cm left renal cyst.  4. Mild atherosclerosis of the abdominal aorta and branch vessels.    MR brain:  IMPRESSION:  1. Small acute microhemorrhage positioned at the right frontal operculum,  corresponding with abnormality on prior CT. No appreciable underlying  enhancement. Finding is favored to reflect a bland acute microhemorrhage. While  this is favored benign, a short interval follow-up MRI in 3 months to ensure  these changes resolve is recommended.  2. Several additional chronic microhemorrhages in the right greater than left  cerebral hemispheres, nonspecific, possibly related to hypertension or amyloid  angiopathy. The above acute bleed is suspected to be related to this process.  3. Mild chronic small vessel ischemic disease.  4. Findings communicated to Dr. Jacolyn at 9:30 pm on 06/23/2024.    MR cervical spine:  IMPRESSION:  1. No acute abnormality within the cervical spine or spinal cord.  2. Multilevel cervical spondylosis with resultant moderate spinal stenosis at  C3-4 and C4-5.  3. Moderate-to-severe bilateral C4 through c7 foraminal narrowing as above.    MR thoracic spine:  IMPRESSION:  1. Chronic compression deformity of the T3 superior endplate without  significant retropulsion.  2. No acute or  recent fracture.  3. Ordinary-for-age multilevel thoracic spondylosis without significant  stenosis or overt neural impingement.    PROCEDURES:  Critical Care performed: No  Procedures   MEDICATIONS ORDERED IN ED: Medications  gadobutrol  (GADAVIST ) 1 MMOL/ML injection 7 mL (7 mLs Intravenous Contrast Given 06/23/24 2022)     IMPRESSION / MDM / ASSESSMENT AND PLAN / ED COURSE  I reviewed the triage vital signs and the nursing notes.  78 year old female with PMH as noted above presents after a slip and fall, also with chest pain that has now resolved.  Differential diagnosis includes, but is not limited to:  Fall/trauma: The patient is unsure if she hit her head.  She reports some  neck pain although is not tender there, and also has some tenderness in the lower back.  Given that she is on Eliquis  and at increased risk with a head injury we will obtain CT head as well as a CT of the entire spine.  Chest pain: This is atypical and now resolved.  Differential includes musculoskeletal pain, GERD, neuropathic pain, less likely ACS.  We will obtain chest x-ray and cardiac enzymes.  Patient's presentation is most consistent with acute presentation with potential threat to life or bodily function.  The patient is on the cardiac monitor to evaluate for evidence of arrhythmia and/or significant heart rate changes.  ----------------------------------------- 7:10 PM on 06/23/2024 -----------------------------------------  Chest x-ray shows no acute findings.  Initial troponin is negative.  BMP and CBC are unremarkable.  Repeat troponin is pending.  CT thoracic spine shows a T3 compression fracture.  CT cervical spine shows some sclerotic foci and there is a possible small lesion or mass on the CT head.  I have ordered MRI of the brain, cervical spine, and thoracic spine to further evaluate.  ----------------------------------------- 10:42 PM on  06/23/2024 -----------------------------------------  MRI brain shows a small right frontal microhemorrhage corresponding to the lesion seen on CT.  I consulted and discussed the case with Dr. Deatrice from neurosurgery who recommends outpatient follow-up with the plan for a repeat 63-month MRI.  The patient does not require any acute intervention or additional workup here in the ED.  He recommends continuing her Eliquis .  MRIs of the cervical and thoracic spine showed no acute findings.  The T3 finding turns out to be chronic.  Repeat troponin is negative.  On reassessment the patient remains asymptomatic.  At this time, she is stable for discharge.  The patient feels comfortable going home.  I counseled her extensively on the results of the workup, specialist recommendations, and the plan of care.  I gave strict return precautions, and she expressed understanding.   FINAL CLINICAL IMPRESSION(S) / ED DIAGNOSES   Final diagnoses:  Atypical chest pain  Minor head injury, initial encounter     Rx / DC Orders   ED Discharge Orders     None        Note:  This document was prepared using Dragon voice recognition software and may include unintentional dictation errors.    Jacolyn Pae, MD 06/23/24 2245

## 2024-06-24 ENCOUNTER — Telehealth: Payer: Self-pay

## 2024-06-24 NOTE — Telephone Encounter (Signed)
 The ER called Dr Deatrice about this patient when he was covering call for our department on 06/23/24.  Per Dr Deatrice: [id number] HTN afib on eliquis  s/p slip and fall at [employer], head strike no LOC, neuro intact, head CT with ?trace SAH vs. IPH in right frontal opercula got MRI w/w/oC as interval stable with multiple small SWI likely small hemorrhage vs underlying amyloid or both.  Radiology recommended interval 3 mo repeat though this seems more likely amyloid to me. PLAN: Needs f/u repeat MRI 3 months Likely neurology referral at some point

## 2024-06-25 NOTE — Telephone Encounter (Signed)
 Patient scheduled 07/10/24

## 2024-06-28 ENCOUNTER — Ambulatory Visit: Admitting: Internal Medicine

## 2024-07-02 NOTE — Progress Notes (Addendum)
 Referring Physician:  Bernardo Fend, DO [address] [place],  [place] [address]  Primary Physician:  Bernardo Fend, DO  History of Present Illness: 07/10/24  Discussed the use of AI scribe software for clinical note transcription with the patient, who gave verbal consent to proceed.  History of Present Illness Sonya Harper is a 78 year old female with atrial fibrillation who presents for follow-up after a head trauma with a small right-sided hemorrhage. She was referred by the emergency department for follow-up on her head trauma.  She experiences daily headaches since the trauma, which are not severe. Her vision is blurry, especially when reading small print, and she feels 'funny' sensations in her eye. She has no seizures or limb weakness. She experiences 'brain fog' with slower thought processing, difficulty sleeping, and unusual fatigue since the trauma. Her arm, which she fell on, is painful with tingling in the fingers on that side. She has been off Eliquis  for 15 days following medical advice due to the head trauma.   I have utilized the care everywhere function in epic to review the outside records available from external health systems.  Review of Systems:  A 10 point review of systems is negative, except for the pertinent positives and negatives detailed in the HPI.  Past Medical History: Past Medical History:  Diagnosis Date   A-fib [facility])    Allergy    Arthritis    feet   Asthma    Breast neoplasm    Chronic lower back pain    s/p fall injury   Depression    Dysrhythmia    Fibromyalgia    Hypertension    Hypothyroidism    Lumbar facet arthropathy 11/06/2018   Prediabetes 02/27/2018   Sciatica of right side    Wears dentures    partial upper and lower    Past Surgical History: Past Surgical History:  Procedure Laterality Date   ABDOMINAL HYSTERECTOMY     BIOPSY  04/06/2023   Procedure: BIOPSY;  Surgeon: Jinny Carmine, MD;   Location: [facility] END[facility];  Service: Endoscopy;;   CATARACT EXTRACTION W/PHACO Right 02/02/2022   Procedure: CATARACT EXTRACTION PHACO AND INTRAOCULAR LENS PLACEMENT (IOC) RIGHT;  Surgeon: Mittie Gaskin, MD;  Location: [facility];  Service: Ophthalmology;  Laterality: Right;  4.52 00:45.9   CATARACT EXTRACTION W/PHACO Left 02/16/2022   Procedure: CATARACT EXTRACTION PHACO AND INTRAOCULAR LENS PLACEMENT (IOC) LEFT 7.05 01:19.8;  Surgeon: Mittie Gaskin, MD;  Location: [facility];  Service: Ophthalmology;  Laterality: Left;   COLONOSCOPY WITH PROPOFOL  N/A 05/13/2016   Procedure: COLONOSCOPY WITH PROPOFOL ;  Surgeon: Carmine Jinny, MD;  Location: [facility];  Service: Endoscopy;  Laterality: N/A;   COLONOSCOPY WITH PROPOFOL  N/A 09/20/2019   Procedure: COLONOSCOPY WITH PROPOFOL ;  Surgeon: Jinny Carmine, MD;  Location: [facility];  Service: Endoscopy;  Laterality: N/A;   COLONOSCOPY WITH PROPOFOL  N/A 04/06/2023   Procedure: COLONOSCOPY WITH PROPOFOL ;  Surgeon: Jinny Carmine, MD;  Location: [facility];  Service: Endoscopy;  Laterality: N/A;   ELECTROPHYSIOLOGIC STUDY     Ablation   ESOPHAGOGASTRODUODENOSCOPY (EGD) WITH PROPOFOL  N/A 09/20/2019   Procedure: ESOPHAGOGASTRODUODENOSCOPY (EGD) WITH PROPOFOL ;  Surgeon: Jinny Carmine, MD;  Location: [facility];  Service: Endoscopy;  Laterality: N/A;   ESOPHAGOGASTRODUODENOSCOPY (EGD) WITH PROPOFOL  N/A 04/06/2023   Procedure: ESOPHAGOGASTRODUODENOSCOPY (EGD) WITH PROPOFOL ;  Surgeon: Jinny Carmine, MD;  Location: [facility];  Service: Endoscopy;  Laterality: N/A;   GIVENS CAPSULE STUDY N/A 05/02/2023   Procedure: GIVENS CAPSULE  STUDY;  Surgeon: Jinny Carmine, MD;  Location: [facility] END[facility];  Service: Endoscopy;  Laterality: N/A;   POLYPECTOMY N/A 05/13/2016   Procedure: POLYPECTOMY;  Surgeon: Carmine Jinny, MD;  Location: [facility];  Service: Endoscopy;  Laterality: N/A;   POLYPECTOMY  04/06/2023   Procedure:  POLYPECTOMY;  Surgeon: Jinny Carmine, MD;  Location: [facility];  Service: Endoscopy;;    Allergies: Allergies as of 07/10/2024 - Review Complete 06/23/2024  Allergen Reaction Noted   Molds & smuts Anaphylaxis 04/07/2016   Penicillins Nausea And Vomiting and Rash 03/03/2015    Medications:  Current Outpatient Medications:    albuterol  (VENTOLIN  HFA) 108 (90 Base) MCG/ACT inhaler, Inhale 2 puffs into the lungs every 6 (six) hours as needed for wheezing or shortness of breath., Disp: 3 each, Rfl: 1   amLODipine  (NORVASC ) 5 MG tablet, Take 1 tablet (5 mg total) by mouth daily., Disp: 90 tablet, Rfl: 0   apixaban  (ELIQUIS ) 5 MG TABS tablet, Take 1 tablet (5 mg total) by mouth 2 (two) times daily., Disp: 180 tablet, Rfl: 3   atorvastatin  (LIPITOR) 10 MG tablet, Take 1 tablet (10 mg total) by mouth every other day., Disp: 45 tablet, Rfl: 2   Blood Pressure Monitoring (BLOOD PRESSURE MONITOR/L CUFF) MISC, 1 each by Does not apply route daily. (Patient not taking: Reported on 03/07/2024), Disp: 1 each, Rfl: 0   cetirizine  (ZYRTEC  ALLERGY) 10 MG tablet, Take 1 tablet (10 mg total) by mouth daily., Disp: 90 tablet, Rfl: 1   Cholecalciferol  (VITAMIN D -1000 MAX ST) 25 MCG (1000 UT) tablet, Take by mouth., Disp: , Rfl:    clobetasol  cream (TEMOVATE ) 0.05 %, Apply twice a day to affected areas on legs until resolved then as needed. Avoid applying to face, groin, and axilla, Disp: 45 g, Rfl: 2   DIPHENHYDRAMINE  HCL, TOPICAL, (BENADRYL  ITCH STOPPING) 2 % GEL, Apply 1 application topically in the morning, at noon, and at bedtime. (Patient not taking: Reported on 03/07/2024), Disp: 118 mL, Rfl: 1   EPINEPHrine  0.3 mg/0.3 mL IJ SOAJ injection, INJECT INTO THE MIDDLE OF THE OUTER THIGH AND HOLD FOR 10 SECONDS FOR SEVERE ALLERGIC REACTION, THEN CALL 911 IF USED, Disp: 2 each, Rfl: 1   fluticasone  (FLONASE ) 50 MCG/ACT nasal spray, Place 2 sprays into both nostrils daily., Disp: 16 g, Rfl: 6   fluticasone   furoate-vilanterol (BREO ELLIPTA ) 200-25 MCG/ACT AEPB, Inhale 1 puff into the lungs daily., Disp: 1 each, Rfl: 11   Iron , Ferrous Sulfate , 325 (65 Fe) MG TABS, Take 325 mg by mouth daily., Disp: 90 tablet, Rfl: 1   losartan  (COZAAR ) 100 MG tablet, Take 1 tablet (100 mg total) by mouth daily., Disp: 30 tablet, Rfl: 0   methylPREDNISolone  (MEDROL  DOSEPAK) 4 MG TBPK tablet, Use as directed., Disp: 21 each, Rfl: 0   mometasone  (ELOCON ) 0.1 % cream, Apply once daily up to 5 days a week for eczema flares, Disp: 15 g, Rfl: 1   montelukast  (SINGULAIR ) 10 MG tablet, Take 1 tablet (10 mg total) by mouth at bedtime., Disp: 90 tablet, Rfl: 3   mupirocin  ointment (BACTROBAN ) 2 %, Apply 1 Application topically daily. Qd to excision site, Disp: 22 g, Rfl: 1   nitroGLYCERIN  (NITROSTAT ) 0.4 MG SL tablet, Place 1 tablet (0.4 mg total) under the tongue every 5 (five) minutes as needed for chest pain. Max 3 pills per episode, Disp: 50 tablet, Rfl: 3   omeprazole  (PRILOSEC) 20 MG capsule, TAKE 1 CAPSULE BY MOUTH DAILY, Disp: 90 capsule, Rfl:  0   SYNTHROID  50 MCG tablet, TAKE 1 BY MOUTH DAILY. DO NOT SUBSTITUTE, Disp: , Rfl: 0   traZODone  (DESYREL ) 150 MG tablet, Take 1 tablet (150 mg total) by mouth at bedtime., Disp: 90 tablet, Rfl: 1   Vibegron  (GEMTESA ) 75 MG TABS, Take 1 tablet (75 mg total) by mouth daily., Disp: 30 tablet, Rfl: 11  Social History: Social History   Tobacco Use   Smoking status: Never    Passive exposure: Never   Smokeless tobacco: Never  Vaping Use   Vaping status: Never Used  Substance Use Topics   Alcohol use: No    Alcohol/week: 0.0 standard drinks of alcohol   Drug use: Never    Family Medical History: Family History  Problem Relation Age of Onset   Stroke Mother    Breast cancer Sister 33       breast ca x3   Cancer Sister    Colon cancer Paternal Aunt    Breast cancer Daughter     Physical Examination: Physical Exam HENT:     Head: Normocephalic and atraumatic.   Eyes:     Extraocular Movements: Extraocular movements intact.     Conjunctiva/sclera: Conjunctivae normal.     Pupils: Pupils are equal, round, and reactive to light.  Neurological:     General: No focal deficit present.     Mental Status: She is oriented to person, place, and time.  Psychiatric:        Mood and Affect: Mood normal.        Thought Content: Thought content normal.        Judgment: Judgment normal.  No Pronator Drift   Imaging: EXAM: MRI CERVICAL SPINE WITH AND WITHOUT CONTRAST 06/23/2024 08:37:12 PM   TECHNIQUE: Multiplanar multisequence MRI of the cervical spine was performed without and with the administration of intravenous contrast. 7 mL of gadobutrol  (GADAVIST ) 1 MMOL/ML injection was administered.   COMPARISON: CT from 06/23/2024 and prior MRI from 04/14/2008.   CLINICAL HISTORY: Metastatic disease evaluation. 0.7 x 0.6 x 0.6 cm hyperattenuating lesion in the right frontal operculum; with mild vasogenic edema, suspicious for an intra-axial mass. Recommend brain; MRI with and without contrast for further evaluation.; Abnormal sclerotic foci within the posterior aspects of the C3-C5 vertebral; bodies. Recommend contrast-enhanced MRI of the cervical spine for further; evaluation.; Irregular T3 superior endplate with approximately 20% height loss,; age-indeterminate compression fracture. MRI recommended for further evaluation; Gadavist  7ml   FINDINGS:   BONES AND ALIGNMENT: Straightening of the normal cervical lordosis. Normal vertebral body heights. Abnormal sclerotic foci within the posterior aspects of the C3-C5 vertebral bodies. Chronic compression deformity involving the superior endplate of T3. No abnormal enhancement.   SPINAL CORD: Normal spinal cord size. Normal spinal cord signal.   SOFT TISSUES: No paraspinal mass.   C2-C3: Normal interspace. Mild-to-moderate left worse than right facet arthrosis. No spinal stenosis. Foramina  remain patent.   C3-C4: Degenerative intervertebral disc space narrowing with diffuse disc osteophyte complex. Mild facet and ligamentum flavum hypertrophy. Resultant moderate spinal stenosis. Severe bilateral C4 foraminal narrowing.   C4-C5: Degenerative vertebral disc space narrowing with diffuse disc osteophyte complex. Flattening and effacement of the ventral thecal sac, asymmetric to the left. Mild facet and ligamentum flavum hypertrophy. Moderate spinal stenosis with mild cord flattening but no cord signal changes. Severe left worse than right C5 foraminal narrowing.   C5-C6: Degenerative intervertebral disc space narrowing with diffuse disc osteophyte complex. No significant spinal stenosis. Moderate bilateral C6 foraminal narrowing.  C6-C7: Degenerative vertebral disc space narrowing with diffuse disc osteophyte complex. No significant spinal stenosis. Moderate-to-severe bilateral C7 foraminal stenosis.   C7-T1: Minimal left eccentric disc bulge. Moderate left facet hypertrophy. No spinal stenosis. Foramina remain patent.   IMPRESSION: 1. No acute abnormality within the cervical spine or spinal cord. 2. Multilevel cervical spondylosis with resultant moderate spinal stenosis at C3-4 and C4-5. 3. Moderate-to-severe bilateral C4 through c7 foraminal narrowing as above.   Electronically signed by: Morene Hoard MD 06/23/2024 10:12 PM EDT RP Workstation: [id number]  Narrative & Impression  EXAM: MRI BRAIN WITH AND WITHOUT CONTRAST 06/23/2024 08:37:12 PM   TECHNIQUE: Multiplanar multisequence MRI of the head/brain was performed with and without the administration of intravenous contrast.   COMPARISON: Prior CT from earlier the same day.   CLINICAL HISTORY: Brain/CNS neoplasm, staging. 0.7 x 0.6 x 0.6 cm hyperattenuating lesion in the right frontal operculum; with mild vasogenic edema, suspicious for an intra-axial mass. Recommend brain; MRI with and  without contrast for further evaluation.; Abnormal sclerotic foci within the posterior aspects of the C3-C5 vertebral; bodies. Recommend contrast-enhanced MRI of the cervical spine for further; evaluation.; Irregular T3 superior endplate with approximately 20% height loss,; age-indeterminate compression fracture. MRI recommended for further evaluation; Gadavist  7ml.   FINDINGS:   BRAIN AND VENTRICLES: Cerebral volume within normal limits. Patchy T2/FLAIR hyperintensity involving the periventricular and deep white matter of both hemispheres, consistent with chronic small vessel ischemic disease, mild for age. A 7 mm T1 hyperintense lesion is seen involving the cortical subcortical aspect of the right frontal operculum (series 10, image 87), corresponding with abnormality on prior CT. Associated susceptibility artifact, consistent with hemorrhage (series 6, image 38). Mild localized edema on FLAIR sequence (series 5, image 32). No significant enhancement seen about this lesion. Given this, findings favored to reflect a small acute bland micro hemorrhage. Additionally, several additional chronic micro hemorrhages noted elsewhere about the right greater than left cerebral hemispheres. Findings are nonspecific, and could be related to hypertension or possibly amyloid angiopathy. No acute infarct. No acute intracranial hemorrhage. No mass effect or midline shift. No hydrocephalus. The sella is unremarkable. Normal flow voids. No other abnormal enhancement.   ORBITS: Prior bilateral ocular lens replacement.   SINUSES: Small right maxillary sinus retention cyst.   BONES AND SOFT TISSUES: Normal bone marrow signal and enhancement. No acute soft tissue abnormality.   IMPRESSION: 1. Small acute microhemorrhage positioned at the right frontal operculum, corresponding with abnormality on prior CT. No appreciable underlying enhancement. Finding is favored to reflect a bland acute  microhemorrhage. While this is favored benign, a short interval follow-up MRI in 3 months to ensure these changes resolve is recommended. 2. Several additional chronic microhemorrhages in the right greater than left cerebral hemispheres, nonspecific, possibly related to hypertension or amyloid angiopathy. The above acute bleed is suspected to be related to this process. 3. Mild chronic small vessel ischemic disease. 4. Findings communicated to Dr. Jacolyn at 9:30 pm on 06/23/2024.   Electronically signed by: Morene Hoard MD 06/23/2024 09:35 PM EDT RP Workstation: [id number]   Narrative & Impression  EXAM: CT HEAD WITHOUT CONTRAST 06/23/2024 05:25:16 PM   TECHNIQUE: CT of the head was performed without the administration of intravenous contrast. Automated exposure control, iterative reconstruction, and/or weight based adjustment of the mA/kV was utilized to reduce the radiation dose to as low as reasonably achievable.   COMPARISON: CT head 08/22/2023.   CLINICAL HISTORY: Head trauma, minor (Age >= 65y). Pt to ED via  P[facility]. Pt reports slipped and fell backwards on water . Pt reports a little bit later was having centralized CP and upper back pain. Pt reports cardiac hx. Eliquis  listed in chart.   FINDINGS:   BRAIN AND VENTRICLES: There is a 0.7 x 0.6 x 0.6 cm hyperattenuating rounded lesion involving the right frontal operculum. This finding is best visualized on series 2 image 20 and series 4 image 36. There is mild associated vasogenic edema. There is no evidence of extra-axial blood products in this region. Findings are concerning for an intra-axial mass lesion. Recommend MRI of the brain with and without contrast for further evaluation. There is no evidence of posttraumatic intracranial hemorrhage. No evidence of acute infarct. No hydrocephalus. No extra-axial collection. No mass effect or midline shift.   ORBITS: Bilateral lens replacement. No acute  abnormality.   SINUSES: Mucous retention cyst in the right maxillary sinus. Small osteoma in the right ethmoid sinus.   SOFT TISSUES AND SKULL: No acute soft tissue abnormality. No skull fracture. No acute osseous abnormality. No evidence of trauma of the adjacent calvarium and no trauma to the adjacent soft tissues within the temporal scalp.   IMPRESSION: 1. 0.7 x 0.6 x 0.6 cm hyperattenuating lesion in the right frontal operculum with mild vasogenic edema, suspicious for an intra-axial mass. Recommend brain MRI with and without contrast for further evaluation. 2. No acute intracranial hemorrhage or midline shift related to head trauma.   Electronically signed by: Donnice Mania MD 06/23/2024 05:37 PM EDT RP Workstation: [id number]     I have personally reviewed the images and agree with the above interpretation.  Medical Decision Making/Assessment and Plan: Assessment & Plan Right-sided traumatic intraparenchymal hemorrhage with post-traumatic headache and post-concussion syndrome Small right-sided intraparenchymal hemorrhage confirmed by imaging. Symptoms include headaches, fatigue, and sleep disturbances, consistent with post-concussion syndrome. No new neurological deficits or fluid leakage. No seizures or significant weakness. No immediate surgical intervention required. - Ordered non-emergent CT scan of the head without contrast to assess for changes in hemorrhage size. - Will order MRI in three months to evaluate chronic changes, as requested by radiology - Will coordinate with cardiologist regarding resumption of Eliquis  after CT scan results are available.  Left arm pain after trauma Left arm pain persists post-trauma with no fractures or new neurological deficits.  There are no diagnoses linked to this encounter.   Thank you for involving me in the care of this patient.    Penne MICAEL Sharps MD/MSCR Neurosurgery

## 2024-07-08 ENCOUNTER — Other Ambulatory Visit: Payer: Self-pay | Admitting: Physician Assistant

## 2024-07-08 DIAGNOSIS — N3281 Overactive bladder: Secondary | ICD-10-CM

## 2024-07-10 ENCOUNTER — Ambulatory Visit (INDEPENDENT_AMBULATORY_CARE_PROVIDER_SITE_OTHER): Admitting: Neurosurgery

## 2024-07-10 ENCOUNTER — Encounter: Payer: Self-pay | Admitting: Neurosurgery

## 2024-07-10 VITALS — BP 162/80 | Ht 66.0 in | Wt 167.0 lb

## 2024-07-10 DIAGNOSIS — W19XXXA Unspecified fall, initial encounter: Secondary | ICD-10-CM | POA: Diagnosis not present

## 2024-07-10 DIAGNOSIS — S06360A Traumatic hemorrhage of cerebrum, unspecified, without loss of consciousness, initial encounter: Secondary | ICD-10-CM

## 2024-07-10 DIAGNOSIS — D1802 Hemangioma of intracranial structures: Secondary | ICD-10-CM

## 2024-07-10 DIAGNOSIS — M79602 Pain in left arm: Secondary | ICD-10-CM | POA: Diagnosis not present

## 2024-07-11 ENCOUNTER — Other Ambulatory Visit: Payer: Self-pay | Admitting: Physician Assistant

## 2024-07-11 DIAGNOSIS — N3281 Overactive bladder: Secondary | ICD-10-CM

## 2024-07-11 DIAGNOSIS — D1802 Hemangioma of intracranial structures: Secondary | ICD-10-CM | POA: Insufficient documentation

## 2024-07-12 ENCOUNTER — Ambulatory Visit
Admission: RE | Admit: 2024-07-12 | Discharge: 2024-07-12 | Disposition: A | Discharge status: Home / Self Care | Source: Ambulatory Visit | Attending: Neurosurgery | Admitting: Neurosurgery

## 2024-07-12 DIAGNOSIS — D1802 Hemangioma of intracranial structures: Secondary | ICD-10-CM | POA: Diagnosis present

## 2024-07-18 ENCOUNTER — Other Ambulatory Visit: Payer: Self-pay | Admitting: Internal Medicine

## 2024-07-18 DIAGNOSIS — K219 Gastro-esophageal reflux disease without esophagitis: Secondary | ICD-10-CM

## 2024-07-19 ENCOUNTER — Ambulatory Visit

## 2024-07-19 NOTE — Telephone Encounter (Signed)
 Requested medication (s) are due for refill today - yes  Requested medication (s) are on the active medication list -yes  Future visit scheduled -no  Last refill: 04/24/24 #90  Notes to clinic: patient may have changed provider- sent for review   Requested Prescriptions  Pending Prescriptions Disp Refills   omeprazole  (PRILOSEC) 20 MG capsule [Pharmacy Med Name: OMEPRAZOLE  DR 20 MG CAPSULE] 90 capsule 0    Sig: TAKE 1 CAPSULE BY MOUTH DAILY     Gastroenterology: Proton Pump Inhibitors Passed - 07/19/2024  4:20 PM      Passed - Valid encounter within last 12 months    Recent Outpatient Visits           1 month ago Benign essential HTN   [facility] Bernardo Fend, DO   4 months ago Chronic cough   [facility] Bernardo Fend, DO   6 months ago Rhinosinusitis   [facility] Bernardo Fend, DO                 Requested Prescriptions  Pending Prescriptions Disp Refills   omeprazole  (PRILOSEC) 20 MG capsule [Pharmacy Med Name: OMEPRAZOLE  DR 20 MG CAPSULE] 90 capsule 0    Sig: TAKE 1 CAPSULE BY MOUTH DAILY     Gastroenterology: Proton Pump Inhibitors Passed - 07/19/2024  4:20 PM      Passed - Valid encounter within last 12 months    Recent Outpatient Visits           1 month ago Benign essential HTN   [facility] Bernardo Fend, DO   4 months ago Chronic cough   [facility] Bernardo Fend, DO   6 months ago Rhinosinusitis   [facility] Bernardo Fend, [place]

## 2024-07-31 ENCOUNTER — Ambulatory Visit: Payer: Self-pay | Admitting: Neurosurgery

## 2024-07-31 ENCOUNTER — Telehealth: Payer: Self-pay | Admitting: Neurosurgery

## 2024-07-31 NOTE — Telephone Encounter (Signed)
 Patient is calling to follow up on the imaging she had done. She states that she is still really tired feeling since she saw our office last and would like to make sure everything is okay. Please advise.

## 2024-07-31 NOTE — Telephone Encounter (Signed)
 Please review her Head CT that was done on 07/12/24.

## 2024-08-01 ENCOUNTER — Other Ambulatory Visit: Payer: Self-pay | Admitting: Internal Medicine

## 2024-08-01 DIAGNOSIS — I10 Essential (primary) hypertension: Secondary | ICD-10-CM

## 2024-08-02 ENCOUNTER — Other Ambulatory Visit: Payer: Self-pay | Admitting: Internal Medicine

## 2024-08-02 DIAGNOSIS — R911 Solitary pulmonary nodule: Secondary | ICD-10-CM

## 2024-08-02 DIAGNOSIS — I480 Paroxysmal atrial fibrillation: Secondary | ICD-10-CM

## 2024-08-06 NOTE — Telephone Encounter (Signed)
 No longer under prescriber care.  Requested Prescriptions  Pending Prescriptions Disp Refills   losartan  (COZAAR ) 100 MG tablet [Pharmacy Med Name: LOSARTAN  POTASSIUM 100 MG TAB] 30 tablet 0    Sig: TAKE 1 TABLET BY MOUTH DAILY     Cardiovascular:  Angiotensin Receptor Blockers Failed - 08/06/2024 11:40 AM      Failed - Cr in normal range and within 180 days    Creat  Date Value Ref Range Status  04/26/2023 0.94 0.60 - 1.00 mg/dL Final   Creatinine, Ser  Date Value Ref Range Status  06/23/2024 1.05 (H) 0.44 - 1.00 mg/dL Final   Creatinine, Urine  Date Value Ref Range Status  10/13/2017 184 20 - 275 mg/dL Final         Failed - Last BP in normal range    BP Readings from Last 1 Encounters:  07/10/24 (!) 162/80         Passed - K in normal range and within 180 days    Potassium  Date Value Ref Range Status  06/23/2024 4.1 3.5 - 5.1 mmol/L Final  06/03/2012 3.9 3.5 - 5.1 mmol/L Final         Passed - Patient is not pregnant      Passed - Valid encounter within last 6 months    Recent Outpatient Visits           1 month ago Benign essential HTN   [facility] Bernardo Fend, DO   5 months ago Chronic cough   [facility] Bernardo Fend, DO   7 months ago Rhinosinusitis   [facility] Bernardo Fend, [place]

## 2024-08-12 ENCOUNTER — Ambulatory Visit

## 2024-08-15 ENCOUNTER — Ambulatory Visit: Admission: RE | Admit: 2024-08-15 | Discharge: 2024-08-15 | Attending: Internal Medicine | Admitting: Internal Medicine

## 2024-08-15 DIAGNOSIS — I480 Paroxysmal atrial fibrillation: Secondary | ICD-10-CM | POA: Insufficient documentation

## 2024-08-15 DIAGNOSIS — R911 Solitary pulmonary nodule: Secondary | ICD-10-CM | POA: Diagnosis present

## 2024-08-16 ENCOUNTER — Telehealth: Payer: Self-pay | Admitting: Neurosurgery

## 2024-08-16 ENCOUNTER — Other Ambulatory Visit: Payer: Self-pay | Admitting: Physician Assistant

## 2024-08-16 DIAGNOSIS — D1802 Hemangioma of intracranial structures: Secondary | ICD-10-CM

## 2024-08-16 DIAGNOSIS — F0781 Postconcussional syndrome: Secondary | ICD-10-CM

## 2024-08-16 NOTE — Telephone Encounter (Signed)
 Patient called to follow up regarding next steps. She prefers phone communication only and does not wish to be contacted via MyChart, as she does not have access (her daughter does). The patient would like to schedule a follow-up with the headache team at this time as per Dr.Smiths message. Please proceed with sending the referral.

## 2024-08-16 NOTE — Telephone Encounter (Signed)
 Patient called to follow up regarding next steps. She prefers phone communication only and does not wish to be contacted via MyChart, as she does not have access (her daughter does). The patient would like to schedule a follow-up with the headache team at this time. Please proceed with sending the referral.

## 2024-08-16 NOTE — Addendum Note (Signed)
 Addended by: Esther Bradstreet on: 08/16/2024 04:50 PM   Modules accepted: Orders

## 2024-08-16 NOTE — Telephone Encounter (Addendum)
 Notified the patient of the referral to neurology. She requested a referral in [place]. Referral location updated to [facility].

## 2024-08-16 NOTE — Telephone Encounter (Signed)
 Are you able to place this referral? Per telephone message he said he would send her to the headache team?

## 2024-08-16 NOTE — Telephone Encounter (Signed)
 Please place referral to appropriate place

## 2024-09-02 ENCOUNTER — Other Ambulatory Visit: Payer: Self-pay | Admitting: Internal Medicine

## 2024-09-02 DIAGNOSIS — R591 Generalized enlarged lymph nodes: Secondary | ICD-10-CM

## 2024-09-02 DIAGNOSIS — R911 Solitary pulmonary nodule: Secondary | ICD-10-CM

## 2024-09-09 ENCOUNTER — Ambulatory Visit
Admission: RE | Admit: 2024-09-09 | Discharge: 2024-09-09 | Disposition: A | Discharge status: Home / Self Care | Source: Ambulatory Visit | Attending: Internal Medicine | Admitting: Internal Medicine

## 2024-09-09 DIAGNOSIS — R591 Generalized enlarged lymph nodes: Secondary | ICD-10-CM | POA: Diagnosis present

## 2024-09-09 DIAGNOSIS — E041 Nontoxic single thyroid nodule: Secondary | ICD-10-CM | POA: Diagnosis not present

## 2024-09-09 DIAGNOSIS — R911 Solitary pulmonary nodule: Secondary | ICD-10-CM | POA: Diagnosis present

## 2024-09-09 LAB — GLUCOSE, CAPILLARY: Glucose-Capillary: 93 mg/dL (ref 70–99)

## 2024-09-09 MED ORDER — FLUDEOXYGLUCOSE F - 18 (FDG) INJECTION
9.2300 | Freq: Once | INTRAVENOUS | Status: AC | PRN
  Administered 2024-09-09: 9.23 via INTRAVENOUS

## 2024-09-17 ENCOUNTER — Inpatient Hospital Stay

## 2024-09-17 ENCOUNTER — Encounter: Payer: Self-pay | Admitting: Oncology

## 2024-09-17 ENCOUNTER — Inpatient Hospital Stay: Attending: Oncology | Admitting: Oncology

## 2024-09-17 VITALS — BP 148/67 | HR 94 | Temp 97.3°F | Resp 18 | Wt 169.6 lb

## 2024-09-17 DIAGNOSIS — E041 Nontoxic single thyroid nodule: Secondary | ICD-10-CM | POA: Insufficient documentation

## 2024-09-17 DIAGNOSIS — R591 Generalized enlarged lymph nodes: Secondary | ICD-10-CM

## 2024-09-17 DIAGNOSIS — R911 Solitary pulmonary nodule: Secondary | ICD-10-CM | POA: Insufficient documentation

## 2024-09-17 DIAGNOSIS — D509 Iron deficiency anemia, unspecified: Secondary | ICD-10-CM | POA: Diagnosis not present

## 2024-09-17 LAB — CBC WITH DIFFERENTIAL/PLATELET
Abs Immature Granulocytes: 0.02 K/uL (ref 0.00–0.07)
Basophils Absolute: 0 K/uL (ref 0.0–0.1)
Basophils Relative: 1 %
Eosinophils Absolute: 0.1 K/uL (ref 0.0–0.5)
Eosinophils Relative: 1 %
HCT: 39.8 % (ref 36.0–46.0)
Hemoglobin: 13.1 g/dL (ref 12.0–15.0)
Immature Granulocytes: 0 %
Lymphocytes Relative: 34 %
Lymphs Abs: 2 K/uL (ref 0.7–4.0)
MCH: 28.4 pg (ref 26.0–34.0)
MCHC: 32.9 g/dL (ref 30.0–36.0)
MCV: 86.1 fL (ref 80.0–100.0)
Monocytes Absolute: 0.4 K/uL (ref 0.1–1.0)
Monocytes Relative: 7 %
Neutro Abs: 3.5 K/uL (ref 1.7–7.7)
Neutrophils Relative %: 57 %
Platelets: 230 K/uL (ref 150–400)
RBC: 4.62 MIL/uL (ref 3.87–5.11)
RDW: 14.5 % (ref 11.5–15.5)
WBC: 6 K/uL (ref 4.0–10.5)
nRBC: 0 % (ref 0.0–0.2)

## 2024-09-17 LAB — COMPREHENSIVE METABOLIC PANEL WITH GFR
ALT: 11 U/L (ref 0–44)
AST: 17 U/L (ref 15–41)
Albumin: 4.1 g/dL (ref 3.5–5.0)
Alkaline Phosphatase: 104 U/L (ref 38–126)
Anion gap: 12 (ref 5–15)
BUN: 19 mg/dL (ref 8–23)
CO2: 23 mmol/L (ref 22–32)
Calcium: 9.4 mg/dL (ref 8.9–10.3)
Chloride: 108 mmol/L (ref 98–111)
Creatinine, Ser: 0.92 mg/dL (ref 0.44–1.00)
GFR, Estimated: >60 mL/min
Glucose, Bld: 110 mg/dL — ABNORMAL HIGH (ref 70–99)
Potassium: 3.5 mmol/L (ref 3.5–5.1)
Sodium: 143 mmol/L (ref 135–145)
Total Bilirubin: 0.4 mg/dL (ref 0.0–1.2)
Total Protein: 6.7 g/dL (ref 6.5–8.1)

## 2024-09-17 LAB — URIC ACID: Uric Acid, Serum: 3.5 mg/dL (ref 2.5–7.1)

## 2024-09-17 LAB — LACTATE DEHYDROGENASE: LDH: 185 U/L (ref 105–235)

## 2024-09-17 NOTE — Progress Notes (Addendum)
 " Hematology/Oncology Consult note [phone] [phone]      Patient Care Team: Fernande Ophelia JINNY DOUGLAS, MD as PCP - General (Internal Medicine) Babara Call, MD as Consulting Physician (Oncology)   REFERRING PROVIDER: Fernande Ophelia JINNY DOUGLAS, MD  CHIEF COMPLAINTS/REASON FOR VISIT:  Iron  deficiency anemia  ASSESSMENT & PLAN:  Iron  deficiency anemia Labs are reviewed and discussed with patient. Lab Results  Component Value Date   HGB 13.1 09/17/2024   TIBC 388 06/11/2024   IRONPCTSAT 15 06/11/2024   FERRITIN 14 06/11/2024     No need for IV Venofer .  Recommend patient to take oral iron  supplementation every other day for maintenance.   Lymphadenopathy Hypermetabolic right axillary lymphadenopathy and left external iliac lymph node.  Questionable lymphoproliferative disease.  Check CBC, CMP, flow cytometry. Will obtain right breast diagnostic mammogram with ultrasound followed by ultrasound-guided biopsy of right axilla lymph node for further evaluation.  Left upper lobe pulmonary nodule Small but hypermetabolic lung nodule on PET scan. Discussed case with patient's pulmonologist Dr. Parris who will review his CT   Thyroid  nodule Plan future thyroid  US  and biopsy  For now will hold off so that she can get work up done for lymphadenopathy and lung nodule.    Orders Placed This Encounter  Procedures   MM 3D DIAGNOSTIC MAMMOGRAM UNILATERAL RIGHT BREAST    Standing Status:   Future    Expected Date:   09/20/2024    Expiration Date:   09/17/2025    Reason for Exam (SYMPTOM  OR DIAGNOSIS REQUIRED):   right axillary lymphadenopathy    Preferred imaging location?:   [facility]   US  LIMITED ULTRASOUND INCLUDING AXILLA RIGHT BREAST    Standing Status:   Future    Expected Date:   09/20/2024    Expiration Date:   09/17/2025    Reason for Exam (SYMPTOM  OR DIAGNOSIS REQUIRED):   right axillary lymphadenopathy    Preferred imaging location?:   [facility]    Flow cytometry panel-leukemia/lymphoma work-up    Standing Status:   Future    Number of Occurrences:   1    Expected Date:   09/17/2024    Expiration Date:   12/16/2024   Lactate dehydrogenase    Standing Status:   Future    Number of Occurrences:   1    Expected Date:   09/17/2024    Expiration Date:   12/16/2024   CBC with Differential/Platelet    Standing Status:   Future    Number of Occurrences:   1    Expected Date:   09/17/2024    Expiration Date:   12/16/2024   Comprehensive metabolic panel with GFR    Standing Status:   Future    Number of Occurrences:   1    Expected Date:   09/17/2024    Expiration Date:   12/16/2024   Uric acid    Standing Status:   Future    Number of Occurrences:   1    Expected Date:   09/17/2024    Expiration Date:   12/16/2024   Follow up after biopsy All questions were answered. The patient knows to call the clinic with any problems, questions or concerns.  Call Babara, MD, PhD [facility] Hematology Oncology 09/17/2024     HISTORY OF PRESENTING ILLNESS:  Sonya Harper is a  79 y.o.  female with PMH listed below who was referred to me for anemia Reviewed patient's recent labs that  was done.  She was found to have abnormal CBC on 04/26/2023, Hb 10.5, mcv 77.8, ferritin 5, iron  saturation 5, TIBC 434, B12 412 Reviewed patient's previous labs ordered by primary care physician's office, anemia is since April 2024,  No aggravating or improving factors. + fatigue.  She denies recent chest pain on exertion, shortness of breath on minimal exertion, pre-syncopal episodes, or palpitations She had not noticed any recent bleeding such as epistaxis, hematuria or hematochezia.  She is 5mg  BID for Afib.  Her last EGD and colonoscopy were on 04/06/2023, Tubular adenoma and hyperplastic polyps. She just had capsule endoscopy done, result is negative.   INTERVAL HISTORY Sonya Harper is a 79 y.o. female who has above history reviewed by me today presents for  follow up visit for iron  deficiency anemia.  Discussed the use of AI scribe software for clinical note transcription with the patient, who gave verbal consent to proceed.   She experienced a recent fall resulting in head trauma with intracranial hemorrhage and severe chest pain radiating to her back. Imaging performed for trauma revealed a stable left upper lobe lung nodule, unchanged from prior thoracic spine CT. Subsequent PET scan demonstrated borderline activity in the lung nodule (SUV 1.9), increased activity in a right axillary lymph node, and low-level activity in left iliac chain lymph nodes.  Hypermetabolic thyroid  nodule.   She endorses profound fatigue and decreased energy, stating she is sometimes so exhausted she feels she should go to the hospital and is unable to function at her baseline level. She expresses concern that something is wrong. She denies current chest pain, dyspnea, or other acute cardiopulmonary symptoms outside of the initial trauma event. She denies significant tobacco use, having only experimented with smoking as a teenager.  She has a family history notable for breast cancer (sister affected three times, daughter currently under treatment). Her husband deceased from peripheral T cell lymphoma. She has dense breast tissue, and a recent bilateral screening mammogram was unremarkable. She reports no current breast symptoms. She has a known heart murmur and severe allergies to mold and dust, with prior significant toxic mold exposure in her previous home and church, both of which she has left due to health concerns.      MEDICAL HISTORY:  Past Medical History:  Diagnosis Date   A-fib [facility]    Allergy    Arthritis    feet   Asthma    Breast neoplasm    Chronic lower back pain    s/p fall injury   Depression    Dysrhythmia    Fibromyalgia    Hypertension    Hypothyroidism    Lumbar facet arthropathy 11/06/2018   Prediabetes 02/27/2018   Sciatica of right side     Wears dentures    partial upper and lower    SURGICAL HISTORY: Past Surgical History:  Procedure Laterality Date   ABDOMINAL HYSTERECTOMY     BIOPSY  04/06/2023   Procedure: BIOPSY;  Surgeon: Jinny Carmine, MD;  Location: [facility] ENDOSCOPY;  Service: Endoscopy;;   CATARACT EXTRACTION W/PHACO Right 02/02/2022   Procedure: CATARACT EXTRACTION PHACO AND INTRAOCULAR LENS PLACEMENT (IOC) RIGHT;  Surgeon: Mittie Gaskin, MD;  Location: [facility];  Service: Ophthalmology;  Laterality: Right;  4.52 00:45.9   CATARACT EXTRACTION W/PHACO Left 02/16/2022   Procedure: CATARACT EXTRACTION PHACO AND INTRAOCULAR LENS PLACEMENT (IOC) LEFT 7.05 01:19.8;  Surgeon: Mittie Gaskin, MD;  Location: [facility];  Service: Ophthalmology;  Laterality: Left;   COLONOSCOPY WITH  PROPOFOL  N/A 05/13/2016   Procedure: COLONOSCOPY WITH PROPOFOL ;  Surgeon: Rogelia Copping, MD;  Location: [facility];  Service: Endoscopy;  Laterality: N/A;   COLONOSCOPY WITH PROPOFOL  N/A 09/20/2019   Procedure: COLONOSCOPY WITH PROPOFOL ;  Surgeon: Copping Rogelia, MD;  Location: [facility] ENDOSCOPY;  Service: Endoscopy;  Laterality: N/A;   COLONOSCOPY WITH PROPOFOL  N/A 04/06/2023   Procedure: COLONOSCOPY WITH PROPOFOL ;  Surgeon: Copping Rogelia, MD;  Location: [facility] ENDOSCOPY;  Service: Endoscopy;  Laterality: N/A;   ELECTROPHYSIOLOGIC STUDY     Ablation   ESOPHAGOGASTRODUODENOSCOPY (EGD) WITH PROPOFOL  N/A 09/20/2019   Procedure: ESOPHAGOGASTRODUODENOSCOPY (EGD) WITH PROPOFOL ;  Surgeon: Copping Rogelia, MD;  Location: [facility] ENDOSCOPY;  Service: Endoscopy;  Laterality: N/A;   ESOPHAGOGASTRODUODENOSCOPY (EGD) WITH PROPOFOL  N/A 04/06/2023   Procedure: ESOPHAGOGASTRODUODENOSCOPY (EGD) WITH PROPOFOL ;  Surgeon: Copping Rogelia, MD;  Location: [facility] ENDOSCOPY;  Service: Endoscopy;  Laterality: N/A;   GIVENS CAPSULE STUDY N/A 05/02/2023   Procedure: GIVENS CAPSULE STUDY;  Surgeon: Copping Rogelia, MD;  Location: [facility] END[facility];  Service:  Endoscopy;  Laterality: N/A;   POLYPECTOMY N/A 05/13/2016   Procedure: POLYPECTOMY;  Surgeon: Rogelia Copping, MD;  Location: [facility];  Service: Endoscopy;  Laterality: N/A;   POLYPECTOMY  04/06/2023   Procedure: POLYPECTOMY;  Surgeon: Copping Rogelia, MD;  Location: [facility] ENDOSCOPY;  Service: Endoscopy;;    SOCIAL HISTORY: Social History   Socioeconomic History   Marital status: Married    Spouse name: Not on file   Number of children: Not on file   Years of education: Not on file   Highest education level: Not on file  Occupational History   Not on file  Tobacco Use   Smoking status: Never    Passive exposure: Never   Smokeless tobacco: Never  Vaping Use   Vaping status: Never Used  Substance and Sexual Activity   Alcohol use: No    Alcohol/week: 0.0 standard drinks of alcohol   Drug use: Never   Sexual activity: Not Currently    Birth control/protection: Post-menopausal  Other Topics Concern   Not on file  Social History Narrative   Not on file   Social Drivers of Health   Tobacco Use: Low Risk (09/17/2024)   Patient History    Smoking Tobacco Use: Never    Smokeless Tobacco Use: Never    Passive Exposure: Never  Financial Resource Strain: Medium Risk (08/16/2024)   Received from [facility]   Overall Financial Resource Strain (CARDIA)    Difficulty of Paying Living Expenses: Somewhat hard  Food Insecurity: No Food Insecurity (08/16/2024)   Received from [facility]   Epic    Within the past 12 months, you worried that your food would run out before you got the money to buy more.: Never true    Within the past 12 months, the food you bought just didn't last and you didn't have money to get more.: Never true  Transportation Needs: No Transportation Needs (08/16/2024)   Received from [facility] - Transportation    In the past 12 months, has lack of transportation kept you from medical appointments  or from getting medications?: No    Lack of Transportation (Non-Medical): No  Physical Activity: Not on file  Stress: Not on file  Social Connections: Not on file  Intimate Partner Violence: Not At Risk (05/05/2023)   Humiliation, Afraid, Rape, and Kick questionnaire    Fear of Current or Ex-Partner: No    Emotionally Abused: No  Physically Abused: No    Sexually Abused: No  Depression (PHQ2-9): Low Risk (08/22/2023)   Depression (PHQ2-9)    PHQ-2 Score: 0  Alcohol Screen: Low Risk (08/22/2023)   Alcohol Screen    Last Alcohol Screening Score (AUDIT): 0  Housing: Low Risk  (08/16/2024)   Received from [facility]   Epic    In the last 12 months, was there a time when you were not able to pay the mortgage or rent on time?: No    In the past 12 months, how many times have you moved where you were living?: 0    At any time in the past 12 months, were you homeless or living in a shelter (including now)?: No  Utilities: At Risk (08/16/2024)   Received from [facility]   Epic    In the past 12 months has the electric, gas, oil, or water  company threatened to shut off services in your home?: Yes  Health Literacy: Not on file    FAMILY HISTORY: Family History  Problem Relation Age of Onset   Stroke Mother    Breast cancer Sister 52       breast ca x3   Cancer Sister    Colon cancer Paternal Aunt    Breast cancer Daughter     ALLERGIES:  is allergic to molds & smuts and penicillins.  MEDICATIONS:  Current Outpatient Medications  Medication Sig Dispense Refill   albuterol  (VENTOLIN  HFA) 108 (90 Base) MCG/ACT inhaler Inhale 2 puffs into the lungs every 6 (six) hours as needed for wheezing or shortness of breath. 3 each 1   amLODipine  (NORVASC ) 5 MG tablet Take 1 tablet (5 mg total) by mouth daily. 90 tablet 0   atorvastatin  (LIPITOR) 10 MG tablet Take 1 tablet (10 mg total) by mouth every other day. 45 tablet 2   Blood Pressure Monitoring  (BLOOD PRESSURE MONITOR/L CUFF) MISC 1 each by Does not apply route daily. 1 each 0   cetirizine  (ZYRTEC  ALLERGY) 10 MG tablet Take 1 tablet (10 mg total) by mouth daily. 90 tablet 1   Cholecalciferol (VITAMIN D -1000 MAX ST) 25 MCG (1000 UT) tablet Take by mouth.     clobetasol  cream (TEMOVATE ) 0.05 % Apply twice a day to affected areas on legs until resolved then as needed. Avoid applying to face, groin, and axilla 45 g 2   DIPHENHYDRAMINE  HCL, TOPICAL, (BENADRYL  ITCH STOPPING) 2 % GEL Apply 1 application topically in the morning, at noon, and at bedtime. 118 mL 1   EPINEPHrine  0.3 mg/0.3 mL IJ SOAJ injection INJECT INTO THE MIDDLE OF THE OUTER THIGH AND HOLD FOR 10 SECONDS FOR SEVERE ALLERGIC REACTION, THEN CALL 911 IF USED 2 each 1   fluticasone  (FLONASE ) 50 MCG/ACT nasal spray Place 2 sprays into both nostrils daily. 16 g 6   fluticasone  furoate-vilanterol (BREO ELLIPTA ) 200-25 MCG/ACT AEPB Inhale 1 puff into the lungs daily. 1 each 11   Iron , Ferrous Sulfate , 325 (65 Fe) MG TABS Take 325 mg by mouth daily. 90 tablet 1   losartan  (COZAAR ) 100 MG tablet Take 1 tablet (100 mg total) by mouth daily. 30 tablet 0   mometasone  (ELOCON ) 0.1 % cream Apply once daily up to 5 days a week for eczema flares 15 g 1   montelukast  (SINGULAIR ) 10 MG tablet Take 1 tablet (10 mg total) by mouth at bedtime. 90 tablet 3   mupirocin  ointment (BACTROBAN ) 2 % Apply 1 Application topically daily.  Qd to excision site 22 g 1   nitroGLYCERIN  (NITROSTAT ) 0.4 MG SL tablet Place 1 tablet (0.4 mg total) under the tongue every 5 (five) minutes as needed for chest pain. Max 3 pills per episode 50 tablet 3   omeprazole  (PRILOSEC) 20 MG capsule TAKE 1 CAPSULE BY MOUTH DAILY 90 capsule 0   SYNTHROID 50 MCG tablet TAKE 1 BY MOUTH DAILY. DO NOT SUBSTITUTE  0   traZODone  (DESYREL ) 150 MG tablet Take 1 tablet (150 mg total) by mouth at bedtime. 90 tablet 1   Vibegron  (GEMTESA ) 75 MG TABS Take 1 tablet (75 mg total) by mouth daily. 30  tablet 11   apixaban  (ELIQUIS ) 5 MG TABS tablet Take 1 tablet (5 mg total) by mouth 2 (two) times daily. (Patient not taking: Reported on 09/17/2024) 180 tablet 3   No current facility-administered medications for this visit.    Review of Systems  Constitutional:  Positive for fatigue. Negative for appetite change, chills and fever.  HENT:   Negative for hearing loss and voice change.   Eyes:  Negative for eye problems.  Respiratory:  Negative for chest tightness and cough.   Cardiovascular:  Negative for chest pain.  Gastrointestinal:  Negative for abdominal distention, abdominal pain and blood in stool.  Endocrine: Negative for hot flashes.  Genitourinary:  Negative for difficulty urinating and frequency.   Musculoskeletal:  Negative for arthralgias.  Skin:  Negative for itching and rash.  Neurological:  Negative for extremity weakness.  Hematological:  Negative for adenopathy.  Psychiatric/Behavioral:  Negative for confusion.     PHYSICAL EXAMINATION: Vitals:   09/17/24 1436 09/17/24 1444  BP: (!) 160/80 (!) 148/67  Pulse: 94   Resp: 18   Temp: (!) 97.3 F (36.3 C)   SpO2: 99%    Filed Weights   09/17/24 1436  Weight: 169 lb 9.6 oz (76.9 kg)    Physical Exam Constitutional:      General: She is not in acute distress. HENT:     Head: Normocephalic and atraumatic.  Eyes:     General: No scleral icterus. Cardiovascular:     Rate and Rhythm: Normal rate. Rhythm irregular.     Heart sounds: Murmur heard.  Pulmonary:     Effort: Pulmonary effort is normal. No respiratory distress.  Abdominal:     General: Bowel sounds are normal. There is no distension.     Palpations: Abdomen is soft.  Musculoskeletal:        General: No deformity. Normal range of motion.     Cervical back: Normal range of motion and neck supple.  Skin:    General: Skin is warm and dry.     Findings: No erythema or rash.  Neurological:     Mental Status: She is alert and oriented to person,  place, and time. Mental status is at baseline.     Cranial Nerves: No cranial nerve deficit.     Coordination: Coordination normal.  Psychiatric:        Mood and Affect: Mood normal.      LABORATORY DATA:  I have reviewed the data as listed    Latest Ref Rng & Units 09/17/2024    3:18 PM 06/23/2024    4:33 PM 06/11/2024   10:04 AM  CBC  WBC 4.0 - 10.5 K/uL 6.0  7.0  5.1   Hemoglobin 12.0 - 15.0 g/dL 86.8  87.3  87.2   Hematocrit 36.0 - 46.0 % 39.8  39.3  39.3  Platelets 150 - 400 K/uL 230  250  240       Latest Ref Rng & Units 09/17/2024    3:18 PM 06/23/2024    4:33 PM 08/24/2023    7:29 AM  CMP  Glucose 70 - 99 mg/dL 889  846  98   BUN 8 - 23 mg/dL 19  25  12    Creatinine 0.44 - 1.00 mg/dL 9.07  8.94  9.04   Sodium 135 - 145 mmol/L 143  142  136   Potassium 3.5 - 5.1 mmol/L 3.5  4.1  3.6   Chloride 98 - 111 mmol/L 108  107  103   CO2 22 - 32 mmol/L 23  26  23    Calcium  8.9 - 10.3 mg/dL 9.4  8.8  8.3   Total Protein 6.5 - 8.1 g/dL 6.7     Total Bilirubin 0.0 - 1.2 mg/dL 0.4     Alkaline Phos 38 - 126 U/L 104     AST 15 - 41 U/L 17     ALT 0 - 44 U/L 11      Lab Results  Component Value Date   IRON  58 06/11/2024   TIBC 388 06/11/2024   IRONPCTSAT 15 06/11/2024   FERRITIN 14 06/11/2024     RADIOGRAPHIC STUDIES: I have personally reviewed the radiological images as listed and agreed with the findings in the report. NM PET Image Initial (PI) Skull Base To Thigh (F-18 FDG) Result Date: 09/15/2024 CLINICAL DATA:  Initial treatment strategy for lung nodule, lymphadenopathy. EXAM: NUCLEAR MEDICINE PET SKULL BASE TO THIGH TECHNIQUE: 9.2 mCi F-18 FDG was injected intravenously. Full-ring PET imaging was performed from the skull base to thigh after the radiotracer. CT data was obtained and used for attenuation correction and anatomic localization. Fasting blood glucose: 93 mg/dl COMPARISON:  CT chest 87/88/7974, CT abdomen pelvis 05/14/2023. FINDINGS: Mediastinal blood pool  activity: SUV max 2.3 Liver activity: SUV max NA NECK: No abnormal hypermetabolism. Incidental CT findings: None. CHEST: Focal hypermetabolism in the posterior right thyroid  corresponds to a 12 mm nodule (6/39), SUV max 3.9. Hypermetabolic right axillary lymph nodes measure up to 1.3 cm (6/59), SUV max 6.9. 8 mm anterior segment left upper lobe nodule (6/53) is borderline for PET resolution but is visualized, SUV max 1.9. No additional abnormal hypermetabolism. Incidental CT findings: Heart is enlarged.  No pericardial or pleural effusion. ABDOMEN/PELVIS: 9 mm left external iliac lymph node (6/138), SUV max 2.5. No additional abnormal hypermetabolism. Incidental CT findings: Low-attenuation mass in the left kidney. No specific follow-up necessary. SKELETON: No abnormal hypermetabolism. Incidental CT findings: Degenerative changes in the spine. Slight compression of the T3 vertebral body, unchanged. Old sternal fracture. IMPRESSION: 1. Hypermetabolic right axillary and left external iliac lymph nodes, worrisome for a low-grade lymphoproliferative disorder. 2. 8 mm left upper lobe nodule is borderline in size for PET resolution but is visualized, raising concern for primary bronchogenic carcinoma. Consider short-term follow-up CT chest without contrast in 2-3 months. 3. Hypermetabolic posterior right thyroid  nodule. Recommend thyroid  US  and biopsy. (Ref: J Am Coll Radiol. 2015 Feb;12(2): 143-50). Electronically Signed   By: Newell Eke M.D.   On: 09/15/2024 16:51   "

## 2024-09-17 NOTE — Assessment & Plan Note (Signed)
 Plan future thyroid  US  and biopsy  For now will hold off so that she can get work up done for lymphadenopathy and lung nodule.

## 2024-09-17 NOTE — Assessment & Plan Note (Signed)
 Hypermetabolic right axillary lymphadenopathy and left external iliac lymph node.  Questionable lymphoproliferative disease.  Check CBC, CMP, flow cytometry. Will obtain right breast diagnostic mammogram with ultrasound followed by ultrasound-guided biopsy of right axilla lymph node for further evaluation.

## 2024-09-17 NOTE — Assessment & Plan Note (Signed)
 Small but hypermetabolic lung nodule on PET scan. Discussed case with patient's pulmonologist Dr. Parris who will review his CT

## 2024-09-17 NOTE — Assessment & Plan Note (Signed)
 Labs are reviewed and discussed with patient. Lab Results  Component Value Date   HGB 13.1 09/17/2024   TIBC 388 06/11/2024   IRONPCTSAT 15 06/11/2024   FERRITIN 14 06/11/2024     No need for IV Venofer .  Recommend patient to take oral iron  supplementation every other day for maintenance.

## 2024-09-18 ENCOUNTER — Other Ambulatory Visit: Payer: Self-pay | Admitting: Oncology

## 2024-09-18 DIAGNOSIS — R928 Other abnormal and inconclusive findings on diagnostic imaging of breast: Secondary | ICD-10-CM

## 2024-09-19 ENCOUNTER — Other Ambulatory Visit: Payer: Self-pay | Admitting: Oncology

## 2024-09-19 ENCOUNTER — Telehealth: Payer: Self-pay | Admitting: *Deleted

## 2024-09-19 ENCOUNTER — Ambulatory Visit
Admission: RE | Admit: 2024-09-19 | Discharge: 2024-09-19 | Disposition: A | Source: Ambulatory Visit | Attending: Oncology

## 2024-09-19 ENCOUNTER — Ambulatory Visit
Admission: RE | Admit: 2024-09-19 | Discharge: 2024-09-19 | Disposition: A | Discharge status: Home / Self Care | Source: Ambulatory Visit | Attending: Oncology | Admitting: Oncology

## 2024-09-19 ENCOUNTER — Telehealth: Payer: Self-pay

## 2024-09-19 DIAGNOSIS — R591 Generalized enlarged lymph nodes: Secondary | ICD-10-CM | POA: Insufficient documentation

## 2024-09-19 DIAGNOSIS — D509 Iron deficiency anemia, unspecified: Secondary | ICD-10-CM

## 2024-09-19 DIAGNOSIS — R928 Other abnormal and inconclusive findings on diagnostic imaging of breast: Secondary | ICD-10-CM | POA: Insufficient documentation

## 2024-09-19 DIAGNOSIS — R2231 Localized swelling, mass and lump, right upper limb: Secondary | ICD-10-CM

## 2024-09-19 DIAGNOSIS — E041 Nontoxic single thyroid nodule: Secondary | ICD-10-CM

## 2024-09-19 DIAGNOSIS — R911 Solitary pulmonary nodule: Secondary | ICD-10-CM

## 2024-09-19 LAB — COMP PANEL: LEUKEMIA/LYMPHOMA

## 2024-09-19 MED ORDER — LIDOCAINE 1 % OPTIME INJ - NO CHARGE
2.0000 mL | Freq: Once | INTRAMUSCULAR | Status: AC
  Administered 2024-09-19: 2 mL
  Filled 2024-09-19: qty 2

## 2024-09-19 MED ORDER — LIDOCAINE-EPINEPHRINE 1 %-1:100000 IJ SOLN
5.0000 mL | Freq: Once | INTRAMUSCULAR | Status: AC
  Administered 2024-09-19: 5 mL
  Filled 2024-09-19: qty 10

## 2024-09-19 NOTE — Telephone Encounter (Signed)
 [place] called triage line. Patient currently in [place] and scheduled for a biopsy of the axillary lymph nodes. Dr. Anice reports that the size of the axillary lymph node is stable. Axillary lymph nodes were last biopsied in 2017. Biopsy at that time showed reactive follicular lymphoid hyperplasia. Dr. Anice wanted to know if he should still proceed with biopsy. Patient had already been consented by Dr. Anice. Msg given to Dr. Babara and her team.  Per Almarie, RN for Dr. Babara. Pt will switch care to Dr. rao per ph note. Dr. Babara said from her standpoint she recommends to proceed but with pt requesting to swich care to dr. melanee, then she recommends hold off until she see's Dr.Rao.  12-noon-[facility] informed of Dr. Layvonne comment. I was informed that Dr. Anice decided to go ahead and proceed. He was already performing the procedure.

## 2024-09-19 NOTE — Telephone Encounter (Signed)
 Dr. Babara is ok with transfer of care.

## 2024-09-19 NOTE — Telephone Encounter (Signed)
 HI zhou- looks like there are a couple of issues still up in the air. Can you let me know what needs to be done at this point and when I should see her?

## 2024-09-19 NOTE — Telephone Encounter (Signed)
 Mychart message sent to patient to let her know that Dr. Babara has talked to Dr. Parris about her CT scan and that he will contact her to set up a biopsy of the lung nodule.

## 2024-09-19 NOTE — Telephone Encounter (Signed)
 Was speaking with daughter Sonya Harper regarding her MRI results and daughter requested her mother's care to be transferred to Dr. Melanee.  Sonya Harper is currently a Dr. Babara patient.  Asked if patient has seen any other provider besdies Dr. Babara and daughter indicated patient as not.  Daughter Sonya Harper indicated her mother has a lot of issues that need to be addressed and would like mother to be under Dr. Darold care.  Informed daughter I would inform Dr. Melanee, Dr. Babara and scheduling team and see if this request can be accommodated.  Informed it may not be until next week until she hears back regarding this request; daughter verbalized understanding.

## 2024-09-24 LAB — SURGICAL PATHOLOGY

## 2024-09-25 NOTE — Telephone Encounter (Signed)
 Yes I am ok with seeing her. I can see her next Friday. Labs tbd if need be

## 2024-09-27 NOTE — Progress Notes (Signed)
 Jennefer Ester PARAS, MD sent to Carlie Hoose S PROCEDURE / BIOPSY REVIEW Date: 09/27/24  Requested Biopsy site: Left iliac lymph node Reason for request: hx of breast ca Imaging review: Best seen on PET CT 09/09/24, CT AP 05/14/23  Decision: Declined / Defer  Additional comments: Node is guarded by overlying bone/vasculature, making tissue sampling very high risk/impossible.  Node is unchanged in size from 05/14/23.  Would consider observation.  Please contact me with questions, concerns, or if issue pertaining to this request arise.  Ester PARAS Jennefer, MD [facility]       Previous Messages    ----- Message ----- From: Carlie Hoose RAMAN Sent: 09/26/2024   1:47 PM EST To: [facility] Procedure Requests Subject: US  Biopsy                                      IR Approval Request:   Procedure:    US  guided core LT external iliac LN biopsy  Reason:        LT external iliac LN  History:         PET Scan  09/09/24    Provider:     Dr Halina Picking, MD  Provider Contact #   [facility]   Pulmonology   [phone]    **Pt had a US  guided core RT Axillary LN Biopsy on 09/19/24  Thanks

## 2024-10-01 ENCOUNTER — Inpatient Hospital Stay: Admitting: Oncology

## 2024-10-04 ENCOUNTER — Telehealth: Payer: Self-pay | Admitting: Oncology

## 2024-10-04 ENCOUNTER — Inpatient Hospital Stay: Admitting: Oncology

## 2024-10-04 ENCOUNTER — Encounter: Payer: Self-pay | Admitting: Oncology

## 2024-10-04 VITALS — BP 116/66 | HR 102 | Temp 98.4°F | Resp 19 | Ht 66.0 in | Wt 171.8 lb

## 2024-10-04 DIAGNOSIS — R591 Generalized enlarged lymph nodes: Secondary | ICD-10-CM

## 2024-10-04 NOTE — Telephone Encounter (Signed)
 Pt called to see if she could bring 2 visitors to appt today - told her we can only have 1 and she can call the other for the visit - LH

## 2024-10-04 NOTE — Progress Notes (Unsigned)
 Patient is still having right lower extremity pain, that comes & goes, but does worsen at night.

## 2024-10-04 NOTE — Progress Notes (Unsigned)
 "    Hematology/Oncology Consult note [facility]  [phone] [phone]  Patient Care Team: Fernande Ophelia JINNY DOUGLAS, MD as PCP - General (Internal Medicine) Melanee Annah BROCKS, MD as Consulting Physician (Oncology)   Name of the patient: Sonya Harper  [id number]  05/30/1946   Date of visit: 10/04/24  Diagnosis- ***  Chief complaint/ Reason for visit- ***  Heme/Onc history: ***  Interval history- ***  ECOG PS- *** Pain scale- *** Opioid associated constipation- ***  Review of systems- ROS    Allergies[1]   Past Medical History:  Diagnosis Date   A-fib (HCC)    Allergy    Arthritis    feet   Asthma    Breast neoplasm    Chronic lower back pain    s/p fall injury   Depression    Dysrhythmia    Fibromyalgia    Hypertension    Hypothyroidism    Lumbar facet arthropathy 11/06/2018   Prediabetes 02/27/2018   Sciatica of right side    Wears dentures    partial upper and lower     Past Surgical History:  Procedure Laterality Date   ABDOMINAL HYSTERECTOMY     BIOPSY  04/06/2023   Procedure: BIOPSY;  Surgeon: Jinny Carmine, MD;  Location: [facility] ENDOSCOPY;  Service: Endoscopy;;   CATARACT EXTRACTION W/PHACO Right 02/02/2022   Procedure: CATARACT EXTRACTION PHACO AND INTRAOCULAR LENS PLACEMENT (IOC) RIGHT;  Surgeon: Mittie Gaskin, MD;  Location: [facility];  Service: Ophthalmology;  Laterality: Right;  4.52 00:45.9   CATARACT EXTRACTION W/PHACO Left 02/16/2022   Procedure: CATARACT EXTRACTION PHACO AND INTRAOCULAR LENS PLACEMENT (IOC) LEFT 7.05 01:19.8;  Surgeon: Mittie Gaskin, MD;  Location: [facility];  Service: Ophthalmology;  Laterality: Left;   COLONOSCOPY WITH PROPOFOL  N/A 05/13/2016   Procedure: COLONOSCOPY WITH PROPOFOL ;  Surgeon: Carmine Jinny, MD;  Location: [facility];  Service: Endoscopy;  Laterality: N/A;   COLONOSCOPY WITH PROPOFOL  N/A 09/20/2019   Procedure:  COLONOSCOPY WITH PROPOFOL ;  Surgeon: Jinny Carmine, MD;  Location: [facility] ENDOSCOPY;  Service: Endoscopy;  Laterality: N/A;   COLONOSCOPY WITH PROPOFOL  N/A 04/06/2023   Procedure: COLONOSCOPY WITH PROPOFOL ;  Surgeon: Jinny Carmine, MD;  Location: [facility] END[facility]COPY;  Service: Endoscopy;  Laterality: N/A;   ELECTROPHYSIOLOGIC STUDY     Ablation   ESOPHAGOGASTRODUODENOSCOPY (EGD) WITH PROPOFOL  N/A 09/20/2019   Procedure: ESOPHAGOGASTRODUODENOSCOPY (EGD) WITH PROPOFOL ;  Surgeon: Jinny Carmine, MD;  Location: [facility];  Service: Endoscopy;  Laterality: N/A;   ESOPHAGOGASTRODUODENOSCOPY (EGD) WITH PROPOFOL  N/A 04/06/2023   Procedure: ESOPHAGOGASTRODUODENOSCOPY (EGD) WITH PROPOFOL ;  Surgeon: Jinny Carmine, MD;  Location: [facility];  Service: Endoscopy;  Laterality: N/A;   GIVENS CAPSULE STUDY N/A 05/02/2023   Procedure: GIVENS CAPSULE STUDY;  Surgeon: Jinny Carmine, MD;  Location: [place] ENDOS[facility];  Service: Endoscopy;  Laterality: N/A;   POLYPECTOMY N/A 05/13/2016   Procedure: POLYPECTOMY;  Surgeon: Carmine Jinny, MD;  Location: [facility];  Service: Endoscopy;  Laterality: N/A;   POLYPECTOMY  04/06/2023   Procedure: POLYPECTOMY;  Surgeon: Jinny Carmine, MD;  Location: [facility] ENDOSCOPY;  Service: Endoscopy;;    Social History   Socioeconomic History   Marital status: Married    Spouse name: Not on file   Number of children: Not on file   Years of education: Not on file   Highest education level: Not on file  Occupational History   Not on file  Tobacco Use   Smoking status: Never    Passive exposure: Never  Smokeless tobacco: Never  Vaping Use   Vaping status: Never Used  Substance and Sexual Activity   Alcohol use: No    Alcohol/week: 0.0 standard drinks of alcohol   Drug use: Never   Sexual activity: Not Currently    Birth control/protection: Post-menopausal  Other Topics Concern   Not on file  Social History Narrative   Not on file   Social Drivers of Health    Tobacco Use: Low Risk  (09/25/2024)   Received from [facility]   Patient History    Smoking Tobacco Use: Never    Smokeless Tobacco Use: Never    Passive Exposure: Not on file  Financial Resource Strain: Medium Risk (08/16/2024)   Received from [facility]   Overall Financial Resource Strain (CARDIA)    Difficulty of Paying Living Expenses: Somewhat hard  Food Insecurity: No Food Insecurity (08/16/2024)   Received from [facility]   Epic    Within the past 12 months, you worried that your food would run out before you got the money to buy more.: Never true    Within the past 12 months, the food you bought just didn't last and you didn't have money to get more.: Never true  Transportation Needs: No Transportation Needs (08/16/2024)   Received from [facility] - Transportation    In the past 12 months, has lack of transportation kept you from medical appointments or from getting medications?: No    Lack of Transportation (Non-Medical): No  Physical Activity: Not on file  Stress: Not on file  Social Connections: Not on file  Intimate Partner Violence: Not At Risk (05/05/2023)   Humiliation, Afraid, Rape, and Kick questionnaire    Fear of Current or Ex-Partner: No    Emotionally Abused: No    Physically Abused: No    Sexually Abused: No  Depression (PHQ2-9): Low Risk (08/22/2023)   Depression (PHQ2-9)    PHQ-2 Score: 0  Alcohol Screen: Low Risk (08/22/2023)   Alcohol Screen    Last Alcohol Screening Score (AUDIT): 0  Housing: Low Risk  (08/16/2024)   Received from [facility]   Epic    In the last 12 months, was there a time when you were not able to pay the mortgage or rent on time?: No    In the past 12 months, how many times have you moved where you were living?: 0    At any time in the past 12 months, were you homeless or living in a shelter (including now)?:  No  Utilities: At Risk (08/16/2024)   Received from [facility]   Epic    In the past 12 months has the electric, gas, oil, or water  company threatened to shut off services in your home?: Yes  Health Literacy: Not on file    Family History  Problem Relation Age of Onset   Stroke Mother    Breast cancer Sister 27       breast ca x3   Cancer Sister    Colon cancer Paternal Aunt    Breast cancer Daughter     Current Medications[2]  Physical exam: There were no vitals filed for this visit. Physical Exam   I have personally reviewed labs listed below:    Latest Ref Rng & Units 09/17/2024    3:18 PM  CMP  Glucose 70 - 99 mg/dL 889   BUN 8 - 23 mg/dL 19  Creatinine 0.44 - 1.00 mg/dL 9.07   Sodium 864 - 854 mmol/L 143   Potassium 3.5 - 5.1 mmol/L 3.5   Chloride 98 - 111 mmol/L 108   CO2 22 - 32 mmol/L 23   Calcium  8.9 - 10.3 mg/dL 9.4   Total Protein 6.5 - 8.1 g/dL 6.7   Total Bilirubin 0.0 - 1.2 mg/dL 0.4   Alkaline Phos 38 - 126 U/L 104   AST 15 - 41 U/L 17   ALT 0 - 44 U/L 11       Latest Ref Rng & Units 09/17/2024    3:18 PM  CBC  WBC 4.0 - 10.5 K/uL 6.0   Hemoglobin 12.0 - 15.0 g/dL 86.8   Hematocrit 63.9 - 46.0 % 39.8   Platelets 150 - 400 K/uL 230    I have personally reviewed Radiology images listed below: No images are attached to the encounter.  US  AXILLARY NODE CORE BIOPSY RIGHT Addendum Date: 09/25/2024 ADDENDUM REPORT: 09/25/2024 10:12 ADDENDUM: PATHOLOGY revealed: 1. Lymph node, needle/core biopsy, right axilla, hydromark coil clip- REACTIVE LYMPH NODE WITH FOLLICULAR AND PARACORTICAL HYPERPLASIA. Pathology results are CONCORDANT with imaging findings, per Dr. Norleen Croak. Pathology results and recommendations below were discussed with patient by telephone. Patient reported biopsy site doing well with no adverse symptoms, and only slight tenderness at the site. Post biopsy care instructions were reviewed, questions were answered and  my direct phone number was provided. Patient was instructed to call [facility] for any additional questions or concerns related to biopsy site. RECOMMENDATION: 1. Follow-up with provider for clinical evaluation and further management of chronic lymphadenopathy. If there is ongoing clinical concern for lymphoproliferative disorder, excisional biopsy could be considered, although the clinical utility of this is uncertain given prior benign excisional biopsy and multiple prior benign biopsies. 2. Patient instructed to resume annual bilateral screening mammogram due September 2026. Pathology results reported by Mliss CHARM Molt RN 09/25/2024. Electronically Signed   By: Norleen Croak M.D.   On: 09/25/2024 10:12   Result Date: 09/25/2024 CLINICAL DATA:  Hypermetabolic RIGHT axillary node EXAM: US  AXILLARY NODE CORE BIOPSY RIGHT COMPARISON:  Previous exam(s). PROCEDURE: I met with the patient and we discussed the procedure of ultrasound-guided biopsy, including benefits and alternatives. We discussed the high likelihood of a successful procedure. We discussed the risks of the procedure, including infection, bleeding, tissue injury, clip migration, and inadequate sampling. Informed written consent was given. The usual time-out protocol was performed immediately prior to the procedure. Using sterile technique and 1% lidocaine  and 1% lidocaine  with epinephrine  as local anesthetic, under direct ultrasound visualization, a 12 gauge spring-loaded device was used to perform biopsy of an enlarged, hypermetabolic RIGHT axillary node using a inferior approach. At the conclusion of the procedure [facility] coil tissue marker clip was deployed into the biopsy cavity. Follow up 2 view mammogram was performed and dictated separately. IMPRESSION: Ultrasound guided biopsy of an enlarged, hypermetabolic RIGHT axillary node. No apparent complications. Electronically Signed: By: Norleen Croak M.D. On: 09/19/2024 13:34   MM CLIP  PLACEMENT RIGHT Result Date: 09/19/2024 CLINICAL DATA:  Status post RIGHT axillary lymph node biopsy EXAM: 3D DIAGNOSTIC RIGHT MAMMOGRAM POST ULTRASOUND BIOPSY COMPARISON:  Previous exam(s). ACR Breast Density Category c: The breasts are heterogeneously dense, which may obscure small masses. FINDINGS: 3D Mammographic images were obtained following ultrasound guided biopsy of a RIGHT axillary lymph node. The biopsy marking clip is in expected position at the site of biopsy. IMPRESSION: Appropriate positioning of  the [facility] coil shaped biopsy marking clip at the site of biopsy in the RIGHT axilla. Final Assessment: Post Procedure Mammograms for Marker Placement Electronically Signed   By: Norleen Croak M.D.   On: 09/19/2024 15:06   MM 3D DIAGNOSTIC MAMMOGRAM UNILATERAL RIGHT BREAST Result Date: 09/19/2024 CLINICAL DATA:  Hypermetabolic RIGHT axillary lymph node. Chronic history axillary lymphadenopathy with prior benign core biopsy and surgical excisional biopsies. Repeat biopsy being requested by oncology prior to possible treatment of a lung nodule EXAM: DIGITAL DIAGNOSTIC UNILATERAL RIGHT MAMMOGRAM WITH TOMOSYNTHESIS AND CAD; US  AXILLARY RIGHT TECHNIQUE: Right digital diagnostic mammography and breast tomosynthesis was performed. The images were evaluated with computer-aided detection. ; Targeted ultrasound examination of the right axilla was performed. COMPARISON:  Previous exam(s). ACR Breast Density Category c: The breasts are heterogeneously dense, which may obscure small masses. FINDINGS: There is no RIGHT breast mass, calcification, architectural distortion or other worrisome finding. Redemonstrated RIGHT axillary lymphadenopathy, in keeping with multiple prior mammograms. Ultrasound was performed. Complete RIGHT axillary ultrasound demonstrates several abnormal lymph nodes in the low axilla, the largest measuring 2.4 x 1.1 x 2.3 cm. This correlates with the hypermetabolic node seen on PET-CT.  IMPRESSION: 2.4 cm low RIGHT axillary lymph node correlates with the hypermetabolic lymph node seen on recent PET-CT. Ultrasound-guided biopsy will be performed. RECOMMENDATION: Ultrasound-guided biopsy of the RIGHT breast x1. This was subsequently performed and is separately dictated. I have discussed the findings and recommendations with the patient. The recommended procedure was discussed with the patient and questions were answered. Patient expressed their understanding of the recommendation. Patient will be scheduled for the procedure at her earliest convenience by the schedulers. Ordering provider will be notified. If applicable, a reminder letter will be sent to the patient regarding the next appointment. BI-RADS CATEGORY  4: Suspicious. Electronically Signed   By: Norleen Croak M.D.   On: 09/19/2024 14:28   US  AXILLA RIGHT Result Date: 09/19/2024 CLINICAL DATA:  Hypermetabolic RIGHT axillary lymph node. Chronic history axillary lymphadenopathy with prior benign core biopsy and surgical excisional biopsies. Repeat biopsy being requested by oncology prior to possible treatment of a lung nodule EXAM: DIGITAL DIAGNOSTIC UNILATERAL RIGHT MAMMOGRAM WITH TOMOSYNTHESIS AND CAD; US  AXILLARY RIGHT TECHNIQUE: Right digital diagnostic mammography and breast tomosynthesis was performed. The images were evaluated with computer-aided detection. ; Targeted ultrasound examination of the right axilla was performed. COMPARISON:  Previous exam(s). ACR Breast Density Category c: The breasts are heterogeneously dense, which may obscure small masses. FINDINGS: There is no RIGHT breast mass, calcification, architectural distortion or other worrisome finding. Redemonstrated RIGHT axillary lymphadenopathy, in keeping with multiple prior mammograms. Ultrasound was performed. Complete RIGHT axillary ultrasound demonstrates several abnormal lymph nodes in the low axilla, the largest measuring 2.4 x 1.1 x 2.3 cm. This correlates with  the hypermetabolic node seen on PET-CT. IMPRESSION: 2.4 cm low RIGHT axillary lymph node correlates with the hypermetabolic lymph node seen on recent PET-CT. Ultrasound-guided biopsy will be performed. RECOMMENDATION: Ultrasound-guided biopsy of the RIGHT breast x1. This was subsequently performed and is separately dictated. I have discussed the findings and recommendations with the patient. The recommended procedure was discussed with the patient and questions were answered. Patient expressed their understanding of the recommendation. Patient will be scheduled for the procedure at her earliest convenience by the schedulers. Ordering provider will be notified. If applicable, a reminder letter will be sent to the patient regarding the next appointment. BI-RADS CATEGORY  4: Suspicious. Electronically Signed   By: Norleen Croak  M.D.   On: 09/19/2024 14:28   NM PET Image Initial (PI) Skull Base To Thigh (F-18 FDG) Result Date: 09/15/2024 CLINICAL DATA:  Initial treatment strategy for lung nodule, lymphadenopathy. EXAM: NUCLEAR MEDICINE PET SKULL BASE TO THIGH TECHNIQUE: 9.2 mCi F-18 FDG was injected intravenously. Full-ring PET imaging was performed from the skull base to thigh after the radiotracer. CT data was obtained and used for attenuation correction and anatomic localization. Fasting blood glucose: 93 mg/dl COMPARISON:  CT chest 87/88/7974, CT abdomen pelvis 05/14/2023. FINDINGS: Mediastinal blood pool activity: SUV max 2.3 Liver activity: SUV max NA NECK: No abnormal hypermetabolism. Incidental CT findings: None. CHEST: Focal hypermetabolism in the posterior right thyroid  corresponds to a 12 mm nodule (6/39), SUV max 3.9. Hypermetabolic right axillary lymph nodes measure up to 1.3 cm (6/59), SUV max 6.9. 8 mm anterior segment left upper lobe nodule (6/53) is borderline for PET resolution but is visualized, SUV max 1.9. No additional abnormal hypermetabolism. Incidental CT findings: Heart is enlarged.  No  pericardial or pleural effusion. ABDOMEN/PELVIS: 9 mm left external iliac lymph node (6/138), SUV max 2.5. No additional abnormal hypermetabolism. Incidental CT findings: Low-attenuation mass in the left kidney. No specific follow-up necessary. SKELETON: No abnormal hypermetabolism. Incidental CT findings: Degenerative changes in the spine. Slight compression of the T3 vertebral body, unchanged. Old sternal fracture. IMPRESSION: 1. Hypermetabolic right axillary and left external iliac lymph nodes, worrisome for a low-grade lymphoproliferative disorder. 2. 8 mm left upper lobe nodule is borderline in size for PET resolution but is visualized, raising concern for primary bronchogenic carcinoma. Consider short-term follow-up CT chest without contrast in 2-3 months. 3. Hypermetabolic posterior right thyroid  nodule. Recommend thyroid  US  and biopsy. (Ref: J Am Coll Radiol. 2015 Feb;12(2): 143-50). Electronically Signed   By: Newell Eke M.D.   On: 09/15/2024 16:51     Assessment and plan- Patient is a 79 y.o. female ***   Visit Diagnosis No diagnosis found.   Dr. Annah Skene, MD, MPH [facility] at [facility] [phone] 10/04/2024 1:17 PM                     [1] Allergies Allergen Reactions   Molds & Smuts Anaphylaxis   Penicillins Nausea And Vomiting and Rash    Has patient had a PCN reaction causing immediate rash, facial/tongue/throat swelling, SOB or lightheadedness with hypotension: Yes Has patient had a PCN reaction causing severe rash involving mucus membranes or skin necrosis: No Has patient had a PCN reaction that required hospitalization: No Has patient had a PCN reaction occurring within the last 10 years: No If all of the above answers are NO, then may proceed with Cephalosporin use.   [2]  Current Outpatient Medications:    albuterol  (VENTOLIN  HFA) 108 (90 Base) MCG/ACT inhaler, Inhale 2 puffs into the lungs every 6 (six) hours as needed for  wheezing or shortness of breath., Disp: 3 each, Rfl: 1   amLODipine  (NORVASC ) 5 MG tablet, Take 1 tablet (5 mg total) by mouth daily., Disp: 90 tablet, Rfl: 0   apixaban  (ELIQUIS ) 5 MG TABS tablet, Take 1 tablet (5 mg total) by mouth 2 (two) times daily. (Patient not taking: Reported on 09/17/2024), Disp: 180 tablet, Rfl: 3   atorvastatin  (LIPITOR) 10 MG tablet, Take 1 tablet (10 mg total) by mouth every other day., Disp: 45 tablet, Rfl: 2   Blood Pressure Monitoring (BLOOD PRESSURE MONITOR/L CUFF) MISC, 1 each by Does not apply route daily., Disp: 1 each,  Rfl: 0   cetirizine  (ZYRTEC  ALLERGY) 10 MG tablet, Take 1 tablet (10 mg total) by mouth daily., Disp: 90 tablet, Rfl: 1   Cholecalciferol (VITAMIN D -1000 MAX ST) 25 MCG (1000 UT) tablet, Take by mouth., Disp: , Rfl:    clobetasol  cream (TEMOVATE ) 0.05 %, Apply twice a day to affected areas on legs until resolved then as needed. Avoid applying to face, groin, and axilla, Disp: 45 g, Rfl: 2   DIPHENHYDRAMINE  HCL, TOPICAL, (BENADRYL  ITCH STOPPING) 2 % GEL, Apply 1 application topically in the morning, at noon, and at bedtime., Disp: 118 mL, Rfl: 1   EPINEPHrine  0.3 mg/0.3 mL IJ SOAJ injection, INJECT INTO THE MIDDLE OF THE OUTER THIGH AND HOLD FOR 10 SECONDS FOR SEVERE ALLERGIC REACTION, THEN CALL 911 IF USED, Disp: 2 each, Rfl: 1   fluticasone  (FLONASE ) 50 MCG/ACT nasal spray, Place 2 sprays into both nostrils daily., Disp: 16 g, Rfl: 6   fluticasone  furoate-vilanterol (BREO ELLIPTA ) 200-25 MCG/ACT AEPB, Inhale 1 puff into the lungs daily., Disp: 1 each, Rfl: 11   Iron , Ferrous Sulfate , 325 (65 Fe) MG TABS, Take 325 mg by mouth daily., Disp: 90 tablet, Rfl: 1   losartan  (COZAAR ) 100 MG tablet, Take 1 tablet (100 mg total) by mouth daily., Disp: 30 tablet, Rfl: 0   mometasone  (ELOCON ) 0.1 % cream, Apply once daily up to 5 days a week for eczema flares, Disp: 15 g, Rfl: 1   montelukast  (SINGULAIR ) 10 MG tablet, Take 1 tablet (10 mg total)  by mouth at bedtime., Disp: 90 tablet, Rfl: 3   mupirocin  ointment (BACTROBAN ) 2 %, Apply 1 Application topically daily. Qd to excision site, Disp: 22 g, Rfl: 1   nitroGLYCERIN  (NITROSTAT ) 0.4 MG SL tablet, Place 1 tablet (0.4 mg total) under the tongue every 5 (five) minutes as needed for chest pain. Max 3 pills per episode, Disp: 50 tablet, Rfl: 3   omeprazole  (PRILOSEC) 20 MG capsule, TAKE 1 CAPSULE BY MOUTH DAILY, Disp: 90 capsule, Rfl: 0   SYNTHROID 50 MCG tablet, TAKE 1 BY MOUTH DAILY. DO NOT SUBSTITUTE, Disp: , Rfl: 0   traZODone  (DESYREL ) 150 MG tablet, Take 1 tablet (150 mg total) by mouth at bedtime., Disp: 90 tablet, Rfl: 1   Vibegron  (GEMTESA ) 75 MG TABS, Take 1 tablet (75 mg total) by mouth daily., Disp: 30 tablet, Rfl: 11 "

## 2024-10-09 ENCOUNTER — Ambulatory Visit

## 2024-10-10 ENCOUNTER — Ambulatory Visit

## 2024-10-12 ENCOUNTER — Ambulatory Visit: Admission: RE | Admit: 2024-10-12

## 2024-10-14 ENCOUNTER — Ambulatory Visit

## 2024-10-15 ENCOUNTER — Ambulatory Visit

## 2024-12-18 ENCOUNTER — Ambulatory Visit: Admitting: Internal Medicine

## 2025-01-28 ENCOUNTER — Other Ambulatory Visit

## 2025-02-11 ENCOUNTER — Inpatient Hospital Stay

## 2025-02-11 ENCOUNTER — Inpatient Hospital Stay: Admitting: Oncology
